# Patient Record
Sex: Female | Born: 1945
Health system: Southern US, Community
[De-identification: ages and names within clinical notes are randomized; demographics above are authoritative.]

## PROBLEM LIST (undated history)

## (undated) DIAGNOSIS — E785 Hyperlipidemia, unspecified: Secondary | ICD-10-CM

## (undated) DIAGNOSIS — I219 Acute myocardial infarction, unspecified: Secondary | ICD-10-CM

## (undated) DIAGNOSIS — Z4502 Encounter for adjustment and management of automatic implantable cardiac defibrillator: Secondary | ICD-10-CM

## (undated) DIAGNOSIS — Z972 Presence of dental prosthetic device (complete) (partial): Secondary | ICD-10-CM

## (undated) DIAGNOSIS — C50919 Malignant neoplasm of unspecified site of unspecified female breast: Secondary | ICD-10-CM

## (undated) DIAGNOSIS — I10 Essential (primary) hypertension: Secondary | ICD-10-CM

## (undated) DIAGNOSIS — K219 Gastro-esophageal reflux disease without esophagitis: Secondary | ICD-10-CM

## (undated) DIAGNOSIS — M1990 Unspecified osteoarthritis, unspecified site: Secondary | ICD-10-CM

## (undated) DIAGNOSIS — I2510 Atherosclerotic heart disease of native coronary artery without angina pectoris: Secondary | ICD-10-CM

## (undated) DIAGNOSIS — I5022 Chronic systolic (congestive) heart failure: Secondary | ICD-10-CM

## (undated) DIAGNOSIS — R011 Cardiac murmur, unspecified: Secondary | ICD-10-CM

## (undated) DIAGNOSIS — F419 Anxiety disorder, unspecified: Secondary | ICD-10-CM

## (undated) HISTORY — DX: Unspecified osteoarthritis, unspecified site: M19.90

## (undated) HISTORY — DX: Malignant neoplasm of unspecified site of unspecified female breast: C50.919

## (undated) HISTORY — DX: Anxiety disorder, unspecified: F41.9

## (undated) HISTORY — DX: Essential (primary) hypertension: I10

## (undated) HISTORY — PX: RENAL ARTERY STENT: SHX2321

## (undated) HISTORY — DX: Acute myocardial infarction, unspecified: I21.9

## (undated) HISTORY — DX: Cardiac murmur, unspecified: R01.1

## (undated) HISTORY — DX: Chronic systolic (congestive) heart failure: I50.22

## (undated) HISTORY — DX: Gastro-esophageal reflux disease without esophagitis: K21.9

## (undated) HISTORY — DX: Encounter for adjustment and management of automatic implantable cardiac defibrillator: Z45.02

## (undated) HISTORY — DX: Hyperlipidemia, unspecified: E78.5

## (undated) HISTORY — DX: Atherosclerotic heart disease of native coronary artery without angina pectoris: I25.10

---

## 1994-11-15 ENCOUNTER — Encounter: Payer: Self-pay | Admitting: Internal Medicine

## 2005-01-14 HISTORY — PX: CORONARY STENT PLACEMENT: SHX1402

## 2005-02-08 ENCOUNTER — Ambulatory Visit: Payer: Self-pay | Admitting: Cardiology

## 2005-02-08 ENCOUNTER — Inpatient Hospital Stay (HOSPITAL_COMMUNITY): Admission: EM | Admit: 2005-02-08 | Discharge: 2005-02-25 | Payer: Self-pay

## 2005-02-08 ENCOUNTER — Ambulatory Visit: Payer: Self-pay | Admitting: Physical Medicine & Rehabilitation

## 2005-02-10 ENCOUNTER — Ambulatory Visit: Payer: Self-pay | Admitting: Pulmonary Disease

## 2005-02-12 ENCOUNTER — Encounter: Payer: Self-pay | Admitting: Cardiology

## 2005-02-24 ENCOUNTER — Encounter: Payer: Self-pay | Admitting: Cardiology

## 2005-02-25 ENCOUNTER — Inpatient Hospital Stay (HOSPITAL_COMMUNITY)
Admission: RE | Admit: 2005-02-25 | Discharge: 2005-03-12 | Payer: Self-pay | Admitting: Physical Medicine & Rehabilitation

## 2005-02-25 ENCOUNTER — Ambulatory Visit: Payer: Self-pay | Admitting: Physical Medicine & Rehabilitation

## 2005-03-19 ENCOUNTER — Encounter: Payer: Self-pay | Admitting: Physical Medicine & Rehabilitation

## 2005-03-24 ENCOUNTER — Ambulatory Visit: Payer: Self-pay | Admitting: Cardiology

## 2005-03-28 ENCOUNTER — Ambulatory Visit (HOSPITAL_COMMUNITY)
Admission: RE | Admit: 2005-03-28 | Discharge: 2005-03-28 | Payer: Self-pay | Admitting: Physical Medicine & Rehabilitation

## 2005-04-03 ENCOUNTER — Emergency Department (HOSPITAL_COMMUNITY): Admission: EM | Admit: 2005-04-03 | Discharge: 2005-04-04 | Payer: Self-pay | Admitting: Emergency Medicine

## 2005-04-14 ENCOUNTER — Ambulatory Visit: Payer: Self-pay | Admitting: Physical Medicine & Rehabilitation

## 2005-04-14 ENCOUNTER — Encounter
Admission: RE | Admit: 2005-04-14 | Discharge: 2005-07-13 | Payer: Self-pay | Admitting: Physical Medicine & Rehabilitation

## 2005-04-16 ENCOUNTER — Encounter: Payer: Self-pay | Admitting: Physical Medicine & Rehabilitation

## 2005-05-06 ENCOUNTER — Encounter: Payer: Self-pay | Admitting: Internal Medicine

## 2005-05-06 ENCOUNTER — Ambulatory Visit: Payer: Self-pay

## 2005-05-12 ENCOUNTER — Ambulatory Visit: Payer: Self-pay | Admitting: Cardiology

## 2005-06-10 ENCOUNTER — Ambulatory Visit: Payer: Self-pay | Admitting: Cardiology

## 2005-06-23 ENCOUNTER — Ambulatory Visit: Payer: Self-pay | Admitting: Cardiology

## 2005-06-30 ENCOUNTER — Ambulatory Visit: Payer: Self-pay | Admitting: Physical Medicine & Rehabilitation

## 2005-07-11 ENCOUNTER — Ambulatory Visit: Payer: Self-pay | Admitting: Internal Medicine

## 2005-07-16 ENCOUNTER — Ambulatory Visit: Payer: Self-pay | Admitting: Internal Medicine

## 2005-07-17 ENCOUNTER — Encounter: Payer: Self-pay | Admitting: Cardiology

## 2005-07-17 ENCOUNTER — Ambulatory Visit (HOSPITAL_COMMUNITY): Admission: RE | Admit: 2005-07-17 | Discharge: 2005-07-18 | Payer: Self-pay | Admitting: Internal Medicine

## 2005-07-17 HISTORY — PX: PACEMAKER PLACEMENT: SHX43

## 2005-07-30 ENCOUNTER — Ambulatory Visit: Payer: Self-pay

## 2005-07-31 ENCOUNTER — Ambulatory Visit: Payer: Self-pay

## 2005-08-14 ENCOUNTER — Ambulatory Visit: Payer: Self-pay | Admitting: Cardiology

## 2005-08-19 ENCOUNTER — Ambulatory Visit: Payer: Self-pay | Admitting: Cardiology

## 2005-08-26 ENCOUNTER — Ambulatory Visit: Payer: Self-pay | Admitting: *Deleted

## 2005-09-03 ENCOUNTER — Ambulatory Visit: Payer: Self-pay | Admitting: Cardiology

## 2005-09-09 ENCOUNTER — Ambulatory Visit: Payer: Self-pay | Admitting: Cardiology

## 2005-09-15 ENCOUNTER — Ambulatory Visit: Payer: Self-pay | Admitting: Cardiology

## 2005-09-30 ENCOUNTER — Ambulatory Visit: Payer: Self-pay | Admitting: Cardiology

## 2005-10-03 ENCOUNTER — Ambulatory Visit: Payer: Self-pay | Admitting: Cardiology

## 2005-10-03 ENCOUNTER — Ambulatory Visit (HOSPITAL_COMMUNITY): Admission: RE | Admit: 2005-10-03 | Discharge: 2005-10-03 | Payer: Self-pay | Admitting: Cardiology

## 2005-10-08 ENCOUNTER — Ambulatory Visit: Payer: Self-pay | Admitting: Internal Medicine

## 2005-10-16 ENCOUNTER — Ambulatory Visit: Payer: Self-pay | Admitting: Cardiology

## 2005-10-21 ENCOUNTER — Ambulatory Visit: Payer: Self-pay | Admitting: Internal Medicine

## 2005-11-13 ENCOUNTER — Ambulatory Visit: Payer: Self-pay | Admitting: Cardiology

## 2005-12-11 ENCOUNTER — Ambulatory Visit: Payer: Self-pay | Admitting: Internal Medicine

## 2006-01-14 ENCOUNTER — Ambulatory Visit: Payer: Self-pay | Admitting: Internal Medicine

## 2006-01-28 ENCOUNTER — Ambulatory Visit: Payer: Self-pay | Admitting: Internal Medicine

## 2006-03-16 HISTORY — PX: OTHER SURGICAL HISTORY: SHX169

## 2006-03-22 ENCOUNTER — Other Ambulatory Visit: Payer: Self-pay

## 2006-03-22 ENCOUNTER — Inpatient Hospital Stay: Payer: Self-pay | Admitting: Internal Medicine

## 2006-04-02 ENCOUNTER — Ambulatory Visit: Payer: Self-pay | Admitting: Internal Medicine

## 2006-04-03 ENCOUNTER — Ambulatory Visit: Payer: Self-pay | Admitting: Cardiology

## 2006-04-16 ENCOUNTER — Ambulatory Visit: Payer: Self-pay | Admitting: Internal Medicine

## 2006-04-28 ENCOUNTER — Ambulatory Visit: Payer: Self-pay | Admitting: Internal Medicine

## 2006-06-16 HISTORY — PX: NM MYOVIEW LTD: HXRAD82

## 2006-06-22 ENCOUNTER — Ambulatory Visit: Payer: Self-pay | Admitting: Internal Medicine

## 2006-07-06 ENCOUNTER — Ambulatory Visit: Payer: Self-pay | Admitting: Cardiology

## 2006-07-09 ENCOUNTER — Ambulatory Visit: Payer: Self-pay

## 2006-07-17 ENCOUNTER — Ambulatory Visit: Payer: Self-pay | Admitting: Internal Medicine

## 2006-08-05 ENCOUNTER — Ambulatory Visit: Payer: Self-pay | Admitting: Internal Medicine

## 2006-08-25 ENCOUNTER — Ambulatory Visit: Payer: Self-pay | Admitting: Internal Medicine

## 2006-08-25 LAB — CONVERTED CEMR LAB
ALT: 18 units/L (ref 0–40)
HDL: 36.1 mg/dL — ABNORMAL LOW (ref >39.0)
Total CHOL/HDL Ratio: 5.3
VLDL: 48 mg/dL — ABNORMAL HIGH (ref 0–40)

## 2006-11-16 ENCOUNTER — Ambulatory Visit: Payer: Self-pay | Admitting: Internal Medicine

## 2006-11-16 DIAGNOSIS — I5022 Chronic systolic (congestive) heart failure: Secondary | ICD-10-CM | POA: Insufficient documentation

## 2006-11-16 DIAGNOSIS — I10 Essential (primary) hypertension: Secondary | ICD-10-CM | POA: Insufficient documentation

## 2006-11-16 DIAGNOSIS — J309 Allergic rhinitis, unspecified: Secondary | ICD-10-CM | POA: Insufficient documentation

## 2006-11-16 DIAGNOSIS — E785 Hyperlipidemia, unspecified: Secondary | ICD-10-CM | POA: Insufficient documentation

## 2006-11-16 DIAGNOSIS — M549 Dorsalgia, unspecified: Secondary | ICD-10-CM | POA: Insufficient documentation

## 2006-11-16 DIAGNOSIS — I2510 Atherosclerotic heart disease of native coronary artery without angina pectoris: Secondary | ICD-10-CM | POA: Insufficient documentation

## 2006-11-16 DIAGNOSIS — K219 Gastro-esophageal reflux disease without esophagitis: Secondary | ICD-10-CM | POA: Insufficient documentation

## 2006-11-16 DIAGNOSIS — M159 Polyosteoarthritis, unspecified: Secondary | ICD-10-CM | POA: Insufficient documentation

## 2006-11-16 DIAGNOSIS — M150 Primary generalized (osteo)arthritis: Secondary | ICD-10-CM

## 2006-11-17 ENCOUNTER — Ambulatory Visit: Payer: Self-pay | Admitting: Cardiology

## 2006-11-17 LAB — CONVERTED CEMR LAB
BUN: 16 mg/dL (ref 6–23)
Chloride: 105 meq/L (ref 96–112)
Glucose, Bld: 111 mg/dL — ABNORMAL HIGH (ref 70–99)
Potassium: 4.5 meq/L (ref 3.5–5.1)
Sodium: 142 meq/L (ref 135–145)
VLDL: 75 mg/dL — ABNORMAL HIGH (ref 0–40)

## 2006-11-25 ENCOUNTER — Ambulatory Visit: Payer: Self-pay | Admitting: Internal Medicine

## 2006-12-28 ENCOUNTER — Telehealth: Payer: Self-pay | Admitting: Internal Medicine

## 2007-02-03 ENCOUNTER — Ambulatory Visit: Payer: Self-pay | Admitting: Internal Medicine

## 2007-02-22 ENCOUNTER — Ambulatory Visit: Payer: Self-pay | Admitting: Internal Medicine

## 2007-02-25 ENCOUNTER — Telehealth (INDEPENDENT_AMBULATORY_CARE_PROVIDER_SITE_OTHER): Payer: Self-pay | Admitting: *Deleted

## 2007-03-01 ENCOUNTER — Encounter: Payer: Self-pay | Admitting: Internal Medicine

## 2007-03-13 ENCOUNTER — Emergency Department: Payer: Self-pay | Admitting: Emergency Medicine

## 2007-03-24 ENCOUNTER — Encounter: Payer: Self-pay | Admitting: Internal Medicine

## 2007-04-15 ENCOUNTER — Telehealth (INDEPENDENT_AMBULATORY_CARE_PROVIDER_SITE_OTHER): Payer: Self-pay | Admitting: *Deleted

## 2007-05-19 ENCOUNTER — Telehealth (INDEPENDENT_AMBULATORY_CARE_PROVIDER_SITE_OTHER): Payer: Self-pay | Admitting: *Deleted

## 2007-06-16 ENCOUNTER — Ambulatory Visit: Payer: Self-pay | Admitting: Cardiology

## 2007-07-30 ENCOUNTER — Telehealth (INDEPENDENT_AMBULATORY_CARE_PROVIDER_SITE_OTHER): Payer: Self-pay | Admitting: *Deleted

## 2007-07-30 ENCOUNTER — Ambulatory Visit: Payer: Self-pay | Admitting: Internal Medicine

## 2007-07-30 DIAGNOSIS — R946 Abnormal results of thyroid function studies: Secondary | ICD-10-CM | POA: Insufficient documentation

## 2007-07-30 DIAGNOSIS — D649 Anemia, unspecified: Secondary | ICD-10-CM | POA: Insufficient documentation

## 2007-08-02 LAB — CONVERTED CEMR LAB
ALT: 17 units/L (ref 0–35)
AST: 26 units/L (ref 0–37)
Basophils Absolute: 0.1 10*3/uL (ref 0.0–0.1)
Bilirubin, Direct: 0.1 mg/dL (ref 0.0–0.3)
CO2: 30 meq/L (ref 19–32)
Calcium: 9.2 mg/dL (ref 8.4–10.5)
Cholesterol: 182 mg/dL (ref 0–200)
Eosinophils Absolute: 0.2 10*3/uL (ref 0.0–0.6)
Eosinophils Relative: 2.7 % (ref 0.0–5.0)
Free T4: 0.7 ng/dL (ref 0.6–1.6)
GFR calc non Af Amer: 68 mL/min
LDL Cholesterol: 107 mg/dL — ABNORMAL HIGH (ref 0–99)
MCHC: 33.1 g/dL (ref 30.0–36.0)
Neutro Abs: 4 10*3/uL (ref 1.4–7.7)
Neutrophils Relative %: 62.7 % (ref 43.0–77.0)
Platelets: 185 10*3/uL (ref 150–400)
RBC: 3.94 M/uL (ref 3.87–5.11)
RDW: 13 % (ref 11.5–14.6)
Sodium: 142 meq/L (ref 135–145)
TSH: 3.71 microintl units/mL (ref 0.35–5.50)
Total Bilirubin: 0.7 mg/dL (ref 0.3–1.2)
Total Protein: 6.8 g/dL (ref 6.0–8.3)

## 2007-08-04 ENCOUNTER — Ambulatory Visit: Payer: Self-pay | Admitting: Internal Medicine

## 2007-10-11 ENCOUNTER — Telehealth (INDEPENDENT_AMBULATORY_CARE_PROVIDER_SITE_OTHER): Payer: Self-pay | Admitting: *Deleted

## 2007-10-12 ENCOUNTER — Telehealth (INDEPENDENT_AMBULATORY_CARE_PROVIDER_SITE_OTHER): Payer: Self-pay | Admitting: *Deleted

## 2007-11-16 ENCOUNTER — Ambulatory Visit: Payer: Self-pay | Admitting: Internal Medicine

## 2007-12-16 ENCOUNTER — Ambulatory Visit: Payer: Self-pay | Admitting: Cardiology

## 2007-12-29 ENCOUNTER — Ambulatory Visit: Payer: Self-pay

## 2008-02-02 ENCOUNTER — Ambulatory Visit: Payer: Self-pay | Admitting: Internal Medicine

## 2008-02-04 LAB — CONVERTED CEMR LAB
ALT: 15 units/L (ref 0–35)
Albumin: 4 g/dL (ref 3.5–5.2)
Alkaline Phosphatase: 63 units/L (ref 39–117)
Basophils Absolute: 0.1 10*3/uL (ref 0.0–0.1)
Basophils Relative: 1.3 % (ref 0.0–3.0)
Bilirubin, Direct: 0.1 mg/dL (ref 0.0–0.3)
CO2: 29 meq/L (ref 19–32)
Creatinine, Ser: 0.9 mg/dL (ref 0.4–1.2)
HCT: 36.3 % (ref 36.0–46.0)
Hemoglobin: 12.8 g/dL (ref 12.0–15.0)
MCV: 93.8 fL (ref 78.0–100.0)
Monocytes Absolute: 0.4 10*3/uL (ref 0.1–1.0)
Neutro Abs: 4.4 10*3/uL (ref 1.4–7.7)
Neutrophils Relative %: 63.2 % (ref 43.0–77.0)
RBC: 3.87 M/uL (ref 3.87–5.11)
RDW: 13.6 % (ref 11.5–14.6)
Sodium: 144 meq/L (ref 135–145)
Triglycerides: 136 mg/dL (ref 0–149)

## 2008-03-08 ENCOUNTER — Ambulatory Visit: Payer: Self-pay | Admitting: Internal Medicine

## 2008-03-27 ENCOUNTER — Telehealth: Payer: Self-pay | Admitting: Family Medicine

## 2008-04-14 ENCOUNTER — Telehealth: Payer: Self-pay | Admitting: Internal Medicine

## 2008-05-22 ENCOUNTER — Telehealth: Payer: Self-pay | Admitting: Family Medicine

## 2008-06-19 ENCOUNTER — Ambulatory Visit: Payer: Self-pay | Admitting: Internal Medicine

## 2008-07-03 ENCOUNTER — Ambulatory Visit: Payer: Self-pay | Admitting: Internal Medicine

## 2008-07-19 ENCOUNTER — Encounter: Payer: Self-pay | Admitting: Internal Medicine

## 2008-07-19 ENCOUNTER — Telehealth: Payer: Self-pay | Admitting: Internal Medicine

## 2008-08-01 ENCOUNTER — Ambulatory Visit: Payer: Self-pay | Admitting: Cardiology

## 2008-08-14 ENCOUNTER — Encounter: Payer: Self-pay | Admitting: Internal Medicine

## 2008-08-14 ENCOUNTER — Ambulatory Visit: Payer: Self-pay

## 2008-10-12 ENCOUNTER — Encounter: Payer: Self-pay | Admitting: Internal Medicine

## 2008-10-19 ENCOUNTER — Ambulatory Visit: Payer: Self-pay | Admitting: Internal Medicine

## 2008-10-27 ENCOUNTER — Encounter: Payer: Self-pay | Admitting: Internal Medicine

## 2008-11-02 ENCOUNTER — Telehealth: Payer: Self-pay | Admitting: Internal Medicine

## 2008-11-09 DIAGNOSIS — I2589 Other forms of chronic ischemic heart disease: Secondary | ICD-10-CM | POA: Insufficient documentation

## 2008-11-09 DIAGNOSIS — Z95810 Presence of automatic (implantable) cardiac defibrillator: Secondary | ICD-10-CM | POA: Insufficient documentation

## 2008-11-20 ENCOUNTER — Encounter: Payer: Self-pay | Admitting: Internal Medicine

## 2008-11-20 ENCOUNTER — Ambulatory Visit: Payer: Self-pay | Admitting: Internal Medicine

## 2008-11-28 ENCOUNTER — Ambulatory Visit: Payer: Self-pay | Admitting: Internal Medicine

## 2008-11-29 LAB — CONVERTED CEMR LAB
ALT: 13 units/L (ref 0–35)
AST: 23 units/L (ref 0–37)
Alkaline Phosphatase: 73 units/L (ref 39–117)
BUN: 10 mg/dL (ref 6–23)
Basophils Relative: 0.7 % (ref 0.0–3.0)
Chloride: 110 meq/L (ref 96–112)
Eosinophils Relative: 3 % (ref 0.0–5.0)
Free T4: 0.9 ng/dL (ref 0.6–1.6)
HCT: 36.1 % (ref 36.0–46.0)
HDL: 40.9 mg/dL (ref >39.00)
LDL Cholesterol: 113 mg/dL — ABNORMAL HIGH (ref 0–99)
MCV: 95.5 fL (ref 78.0–100.0)
Neutro Abs: 4.9 10*3/uL (ref 1.4–7.7)
Phosphorus: 4.7 mg/dL — ABNORMAL HIGH (ref 2.3–4.6)
Potassium: 4.6 meq/L (ref 3.5–5.1)
RDW: 13.4 % (ref 11.5–14.6)
TSH: 1.49 microintl units/mL (ref 0.35–5.50)
Total Bilirubin: 0.6 mg/dL (ref 0.3–1.2)
Total CHOL/HDL Ratio: 5
Triglycerides: 158 mg/dL — ABNORMAL HIGH (ref 0.0–149.0)

## 2008-12-04 ENCOUNTER — Telehealth: Payer: Self-pay | Admitting: Cardiology

## 2009-02-23 ENCOUNTER — Encounter: Payer: Self-pay | Admitting: Internal Medicine

## 2009-03-20 ENCOUNTER — Telehealth: Payer: Self-pay | Admitting: Internal Medicine

## 2009-04-17 ENCOUNTER — Encounter (INDEPENDENT_AMBULATORY_CARE_PROVIDER_SITE_OTHER): Payer: Self-pay | Admitting: *Deleted

## 2009-05-04 ENCOUNTER — Telehealth: Payer: Self-pay | Admitting: Internal Medicine

## 2009-05-14 ENCOUNTER — Encounter: Payer: Self-pay | Admitting: Internal Medicine

## 2009-06-04 ENCOUNTER — Ambulatory Visit: Payer: Self-pay | Admitting: Internal Medicine

## 2009-06-05 LAB — CONVERTED CEMR LAB
ALT: 14 units/L (ref 0–35)
AST: 21 units/L (ref 0–37)
Albumin: 3.9 g/dL (ref 3.5–5.2)
BUN: 12 mg/dL (ref 6–23)
CO2: 30 meq/L (ref 19–32)
Calcium: 9 mg/dL (ref 8.4–10.5)
Chloride: 103 meq/L (ref 96–112)
Cholesterol: 194 mg/dL (ref 0–200)
Creatinine, Ser: 0.9 mg/dL (ref 0.4–1.2)
Direct LDL: 126 mg/dL
GFR calc non Af Amer: 67.43 mL/min (ref >60)
Glucose, Bld: 90 mg/dL (ref 70–99)
Lymphocytes Relative: 24.3 % (ref 12.0–46.0)
Lymphs Abs: 1.7 10*3/uL (ref 0.7–4.0)
Neutrophils Relative %: 66.9 % (ref 43.0–77.0)
Potassium: 4.2 meq/L (ref 3.5–5.1)
Total Protein: 6.8 g/dL (ref 6.0–8.3)
VLDL: 43.6 mg/dL — ABNORMAL HIGH (ref 0.0–40.0)

## 2009-07-02 ENCOUNTER — Telehealth (INDEPENDENT_AMBULATORY_CARE_PROVIDER_SITE_OTHER): Payer: Self-pay | Admitting: *Deleted

## 2009-08-30 ENCOUNTER — Ambulatory Visit: Payer: Self-pay | Admitting: Internal Medicine

## 2009-08-30 ENCOUNTER — Telehealth (INDEPENDENT_AMBULATORY_CARE_PROVIDER_SITE_OTHER): Payer: Self-pay | Admitting: *Deleted

## 2009-09-03 LAB — CONVERTED CEMR LAB
Calcium: 8.9 mg/dL (ref 8.4–10.5)
Glucose, Bld: 93 mg/dL (ref 70–99)
Potassium: 4.5 meq/L (ref 3.5–5.1)
Pro B Natriuretic peptide (BNP): 157 pg/mL — ABNORMAL HIGH (ref 0.0–100.0)

## 2009-10-03 ENCOUNTER — Encounter (INDEPENDENT_AMBULATORY_CARE_PROVIDER_SITE_OTHER): Payer: Self-pay | Admitting: *Deleted

## 2010-01-15 ENCOUNTER — Telehealth: Payer: Self-pay | Admitting: Family Medicine

## 2010-03-15 ENCOUNTER — Encounter (INDEPENDENT_AMBULATORY_CARE_PROVIDER_SITE_OTHER): Payer: Self-pay | Admitting: *Deleted

## 2010-04-10 ENCOUNTER — Telehealth (INDEPENDENT_AMBULATORY_CARE_PROVIDER_SITE_OTHER): Payer: Self-pay | Admitting: *Deleted

## 2010-04-16 ENCOUNTER — Encounter: Payer: Self-pay | Admitting: Internal Medicine

## 2010-07-07 ENCOUNTER — Encounter: Payer: Self-pay | Admitting: Physical Medicine & Rehabilitation

## 2010-07-16 NOTE — Letter (Signed)
Summary: Device-Delinquent Check  [facility], Main Office  [address]   [place], [place] [address]   Phone: [phone]  Fax: [phone]     October 03, 2009 MRN: [id number]   [facility] 8-C CATES [address] [place], [place]  [address]   Dear Ms. Mersch,  According to our records, you have not had your implanted device checked in the recommended period of time.  We are unable to determine appropriate device function without checking your device on a regular basis.  Please call our [place]  office @ [phone] to schedule an appointment as soon as possible with Dr. Graciela Husbands.    If you are having your device checked by another physician, please call us so that we may update our records.  Thank you,  Altha Harm, LPN  October 03, 2009 3:34 PM  [facility]

## 2010-07-16 NOTE — Progress Notes (Signed)
Summary: ? Bronchitis  Phone Note Call from Patient Call back at Home Phone [phone]   Caller: Patient Call For: Cindee Salt MD Summary of Call: Patient says she called EMS last night because she was having trouble breathing.  She did not go the the ER but the EMS personnel told her that she has bronchitis and needs an antibiotic.  I advised patient that we usually don't prescribe antibiotics without seeing the patient first, but I will give Dr. Alphonsus Sias the message and see what he advises.  She says that she does not have a way to the office today.  Uses [facility] pharmacy.   Initial call taken by: Linde Gillis CMA Duncan Dull),  August 30, 2009 9:21 AM  Follow-up for Phone Call        okay to add on at 4:30PM today I really can't prescribe a medicine without seeing her sometimes what people think is bronchitis could be pneumonia or even a problem with her heart Follow-up by: Cindee Salt MD,  August 30, 2009 12:03 PM  Additional Follow-up for Phone Call Additional follow up Details #1::        spoke with pt and she will call back if she can get a ride here. Pt doesn't want to be seen, just want med called in. Per Dr. Alphonsus Sias she needs to be seen. DeShannon Kozicki CMA Duncan Dull)  August 30, 2009 2:31 PM    Patient is coming at 4:30.  Additional Follow-up by: Melody Comas,  August 30, 2009 2:37 PM

## 2010-07-16 NOTE — Cardiovascular Report (Signed)
Summary: Certified Letter Returned (Moved to [employer])  Psychiatric nurse Returned (Moved to [employer])   Imported By: Debby Freiberg 04/30/2010 11:26:26  _____________________________________________________________________  External Attachment:    Type:   Image     Comment:   External Document

## 2010-07-16 NOTE — Progress Notes (Signed)
Summary: Letter for Medicaid  Phone Note Call from Patient   Caller: Patient Call For: Cindee Salt MD Summary of Call: Patient is requesting that you write a letter claiming her as your patient and that you are her PCP. If they do not get this letter she will have to choose one of their doctors. The letter will exempt her from having to choose one of their doctors.  Patient states that the letter has to have the word "Exemption" in it from her having to choose one of their doctors. This letter is needed for her Medicaid disability.  Please advise. Initial call taken by: Sydell Axon LPN,  July 02, 2009 9:24 AM  Follow-up for Phone Call        there is actually a form that Medicaid sends me which I can claim the exemption. If she can get them to send that to me, I can do it without charge. If I have to write the letter, there is a $20 charge Follow-up by: Cindee Salt MD,  July 02, 2009 1:40 PM  Additional Follow-up for Phone Call Additional follow up Details #1::        I spoke w/ pt. and she'll call and try to get the form sent here. Additional Follow-up by: Beau Fanny,  July 02, 2009 2:11 PM

## 2010-07-16 NOTE — Assessment & Plan Note (Signed)
Summary: bronchitis/ alc   Vital Signs:  Patient profile:   65 year old female Weight:      126 pounds BMI:     21.71 O2 Sat:      96 % on Room air Temp:     98.9 degrees F oral Pulse rate:   70 / minute Pulse rhythm:   regular Resp:     18 per minute BP sitting:   140 / 80  (left arm) Cuff size:   regular  Vitals Entered By: Mervin Hack CMA Duncan Dull) (August 30, 2009 4:50 PM)  O2 Flow:  Room air CC: trouble breathing   History of Present Illness: Having some breathing problems Awoke 2 nights ago with fluid "gushing out of my throat" Had to sit up quickly but doesnt' remember being SOB Last night she couldn't breathe called rescue---they thought it sounded like bronchitis  Started wtih head cold 5 days ago coughing now Clear bubbly sputum clear rhinorrhea  having discomfort over  right kidney Having easy incontinence also  No chest pain No fever last night  No ankle swelling still on furosemide  Allergies: 1)  Oxycodone Hcl (Oxycodone Hcl) 2)  Lipitor (Atorvastatin Calcium) 3)  Lisinopril (Lisinopril) 4)  Simvastatin (Simvastatin)  Past History:  Past medical, surgical, family and social histories (including risk factors) reviewed for relevance to current acute and chronic problems.  Past Medical History: Reviewed history from 11/28/2008 and no changes required. Allergic rhinitis Anxiety Coronary artery disease-----Dr Wall GERD Hyperlipidemia Hypertension Osteoarthritis Congestive heart failure-systolic (ischemic cardiomyopathy)  Past Surgical History: Reviewed history from 11/28/2008 and no changes required. MI/ 3 stents 8/06 Pacer/ defibrillator 2/07 D & C (after loss of preg) 10/66 CHF exacerbation 10/07 Aden myoview EF 39%, no sx ischemia 01/08  Family History: Reviewed history from 03/01/2007 and no changes required. Father: Died at age 69, MI Mother: Died at age 70, CVA Siblings: One brother living, 1/2 brother deceased at age 54  MI CAD is strong in family HTN is strong No DM No breast or colon cancer  Social History: Reviewed history from 11/28/2008 and no changes required. 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  NAD Head:  no sinus tenderness Ears:  R ear normal and L ear normal.   Nose:  mild congestion and inflammation Mouth:  no erythema and no exudates.   Neck:  supple, no masses, and no cervical lymphadenopathy.   Lungs:  Prolonged exp phase and exp wheezing normal respiratory effort, no intercostal retractions, no accessory muscle use, no dullness, and no crackles.   Heart:  normal rate, regular rhythm, and no gallop.   Extremities:  no edema Psych:  normally interactive and good eye contact.   Additional Exam:  CXR--no pneumonia or CHF   Impression & Recommendations:  Problem # 1:  BRONCHITIS- ACUTE (ICD-466.0) Assessment New  with bronchospasm likely the bronchospasm is cause of SOB requiring rescue last night will treat with kenalog IM to help for tonight, then 10 days of prednisone likely viral but will give Rx for amoxil if sputum turns purulent  The following medications were removed from the medication list:    Guaifenesin 100 Mg/37ml Syrp (Guaifenesin) .Marland Kitchen... 2 teaspoons by mouth three times a day as  needed for cough Her updated medication list for this problem includes:    Proair Hfa 108 (90 Base) Mcg/act Aers (Albuterol sulfate) .Marland Kitchen... 2 puffs as needed    Amoxicillin 500 Mg Tabs (Amoxicillin) .Marland Kitchen... 2 tabs by mouth two times a day for bronchitis  Orders: Kenalog 10 mg inj (J3301) Admin of Therapeutic Inj  intramuscular or subcutaneous ([address])  Problem # 2:  FAILURE, SYSTOLIC HEART, CHRONIC (ICD-428.22) Assessment:  Unchanged no exacerbation based on CXR  will check labs though  Her updated medication list for this problem includes:    Coreg 25 Mg Tabs (Carvedilol) .Marland Kitchen... Take 1 tablet by mouth two times a day    Aspirin 81 Mg Tbec (Aspirin) .Marland Kitchen... Take 1 tablet by mouth once a day    Diovan 320 Mg Tabs (Valsartan) .Marland Kitchen... 1 daily    Furosemide 40 Mg Tabs (Furosemide) .Marland Kitchen... 1 daily as needed as needed    Aspirin 81 Mg Tabs (Aspirin) .Marland Kitchen... Take 1 by mouth once daily  Complete Medication List: 1)  Lorazepam 0.5 Mg Tabs (Lorazepam) .... Take 1/2-1 tablet by mouth twice a day as needed 2)  Crestor 10 Mg Tabs (Rosuvastatin calcium) .... Take 1 tablet by mouth at bedtime 3)  Coreg 25 Mg Tabs (Carvedilol) .... Take 1 tablet by mouth two times a day 4)  Omeprazole 20 Mg Cpdr (Omeprazole) .... Take 1 capsule by mouth two times a day 5)  Sertraline Hcl 50 Mg Tabs (Sertraline hcl) .... Take 1 tablet by mouth once a day 6)  Aspirin 81 Mg Tbec (Aspirin) .... Take 1 tablet by mouth once a day 7)  Proair Hfa 108 (90 Base) Mcg/act Aers (Albuterol sulfate) .... 2 puffs as needed 8)  Diovan 320 Mg Tabs (Valsartan) .Marland Kitchen.. 1 daily 9)  Furosemide 40 Mg Tabs (Furosemide) .Marland Kitchen.. 1 daily as needed as needed 10)  Amlodipine Besylate 2.5 Mg Tabs (Amlodipine besylate) .Marland Kitchen.. 1 tab daily for high blood pressure 11)  Saline Nasal Spray 0.65 % Soln (Saline) .... 2 sprays each nostril every 4 hours as needed 12)  Acetaminophen 500 Mg Caps (Acetaminophen) .... As needed 13)  Aspirin 81 Mg Tabs (Aspirin) .... Take 1 by mouth once daily 14)  Prednisone 20 Mg Tabs (Prednisone) .... 2 tabs daily for 5 days, then 1 tab daily for wheezing 15)  Amoxicillin 500 Mg Tabs (Amoxicillin) .... 2 tabs by mouth two times a day for bronchitis  Other Orders: CXR- 2view (CXR) TLB-Renal Function Panel (80069-RENAL) Venipuncture (60454) TLB-BNP (B-Natriuretic Peptide) (83880-BNPR)  Patient Instructions: 1)  Keep regular appt in June 2)  Call if you are  not significantly improved by Monday Prescriptions: AMOXICILLIN 500 MG TABS (AMOXICILLIN) 2 tabs by mouth two times a day for bronchitis  #40 x 0   Entered and Authorized by:   Cindee Salt MD   Signed by:   Cindee Salt MD on 08/30/2009   Method used:   Print then Give to Patient   RxID:   [id number] PREDNISONE 20 MG TABS (PREDNISONE) 2 tabs daily for 5 days, then 1 tab daily for wheezing  #15 x 0   Entered and Authorized by:   Cindee Salt MD   Signed by:   Cindee Salt MD on 08/30/2009   Method used:   Electronically to        [facility] [phone]* (retail)       [address] [place], [place]  [address]  Ph: [phone]       Fax: [phone]   RxID:   [id number]   Current Allergies (reviewed today): OXYCODONE HCL (OXYCODONE HCL) LIPITOR (ATORVASTATIN CALCIUM) LISINOPRIL (LISINOPRIL) SIMVASTATIN (SIMVASTATIN)   Medication Administration  Injection # 1:    Medication: Kenalog 10 mg inj    Diagnosis: BRONCHITIS- ACUTE (ICD-466.0)    Route: IM    Site: L deltoid    Exp Date: 02/15/2011    Lot #: [id number]    Mfr: [employer]    Comments: pt received 40mg /50ml    Patient tolerated injection without complications    Given by: Mervin Hack CMA Duncan Dull) (August 30, 2009 5:37 PM)  Orders Added: 1)  CXR- 2view [CXR] 2)  TLB-Renal Function Panel [80069-RENAL] 3)  Venipuncture [13244] 4)  TLB-BNP (B-Natriuretic Peptide) [83880-BNPR] 5)  Est. Patient Level IV [01027] 6)  Kenalog 10 mg inj [J3301] 7)  Admin of Therapeutic Inj  intramuscular or subcutaneous [[address]]  Appended Document: bronchitis/ alc Received a call from Laurabeth at [facility] pharmacy stating that patient said Dr. Alphonsus Sias gave her a Rx for Amoxicillin but she could not find it, wanted to know if I could give her the Rx verbally.  Gave Rx to her over the phone.

## 2010-07-16 NOTE — Progress Notes (Signed)
   Request received from [facility] faxed most recent, the requesty will be sent to  [facility] for them to copy all records and forward. Cala Bradford Mesiemore  April 10, 2010 2:22 PM

## 2010-07-16 NOTE — Letter (Signed)
Summary: Device-Delinquent Check  [facility], Main Office  [address]   [place], [place] [address]   Phone: [phone]  Fax: [phone]     March 15, 2010 MRN: [id number]   [facility] 8-C CATES [address] [place], [place]  [address]   Dear Ms. Spiering,  According to our records, you have not had your implanted device checked in the recommended period of time.  We are unable to determine appropriate device function without checking your device on a regular basis.  Please call our office to schedule an appointment with Dr Graciela Husbands,  as soon as possible.  If you are having your device checked by another physician, please call us so that we may update our records.  Thank you,  Letta Moynahan, EMT  March 15, 2010 2:54 PM  [facility] certified

## 2010-07-16 NOTE — Progress Notes (Signed)
Summary: refill request for lorazepam  Phone Note Refill Request Message from:  Fax from Pharmacy  Refills Requested: Medication #1:  LORAZEPAM 0.5 MG TABS Take 1/2-1 tablet by mouth twice a day as needed   Last Refilled: 06/18/2009 Faxed request from [facility], [phone].  Initial call taken by: Lowella Petties CMA,  January 15, 2010 8:55 AM  Follow-up for Phone Call        Rx called to pharmacy Follow-up by: Linde Gillis CMA Duncan Dull),  January 15, 2010 9:06 AM    Prescriptions: LORAZEPAM 0.5 MG TABS (LORAZEPAM) Take 1/2-1 tablet by mouth twice a day as needed  #60 x 1   Entered and Authorized by:   Ruthe Mannan MD   Signed by:   Ruthe Mannan MD on 01/15/2010   Method used:   Telephoned to ...       [facility] [address] [phone]* (retail)       [address] [place], [place]  [address]       Ph: [phone]       Fax: [phone]   RxID:   [id number]

## 2010-07-29 ENCOUNTER — Telehealth: Payer: Self-pay | Admitting: Internal Medicine

## 2010-08-07 NOTE — Progress Notes (Signed)
Summary: lorazepam   Phone Note Refill Request Message from:  Fax from Pharmacy on July 29, 2010 10:14 AM  Refills Requested: Medication #1:  LORAZEPAM 0.5 MG TABS Take 1/2-1 tablet by mouth twice a day as needed   Last Refilled: [birth year] 03, 1945 [facility]ill [facility] [address] [phone]. Fax is on your desk.  Initial call taken by: Melody Comas,  July 29, 2010 10:15 AM  Follow-up for Phone Call        Okay #60 x 0  Overdue for appt Have her make follow up Follow-up by: Cindee Salt MD,  July 29, 2010 1:26 PM  Additional Follow-up for Phone Call Additional follow up Details #1::        Rx faxed to pharmacy, left message on machine at home for patient to return my call.  Additional Follow-up by: Mervin Hack CMA Duncan Dull),  July 29, 2010 3:42 PM    Prescriptions: LORAZEPAM 0.5 MG TABS (LORAZEPAM) Take 1/2-1 tablet by mouth twice a day as needed  #60 x 0   Entered by:   Mervin Hack CMA (AAMA)   Authorized by:   Cindee Salt MD   Signed by:   Mervin Hack CMA (AAMA) on 07/29/2010   Method used:   Handwritten   RxID:   [id number]

## 2010-09-09 ENCOUNTER — Other Ambulatory Visit: Payer: Self-pay | Admitting: Internal Medicine

## 2010-10-29 NOTE — Progress Notes (Signed)
[facility] OFFICE NOTE   VALLARIE, FEI                          MRN:          [id number]  DATE:11/20/2008                            DOB:          February 22, 1946    Ms. Achey is seen in followup for ICD implanted for primary prevention  in the setting of ischemic heart disease.  She is doing quite well.  She  has had no complaints of chest pain or shortness of breath.  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   Her current medications include:  1. Crestor.  2. Carvedilol 25 b.i.d.  3. Diovan 320.  4. Amlodipine recently initiated.  5. Sertraline.  6. Lorazepam.  7. Aspirin.   On examination, her blood pressure today was mildly elevated at 144/87.  Her weight was 129.  Her pulse was 62.  She was in no acute distress.  She was alert and oriented.  Her lungs were clear.  Heart sounds were  regular.  The extremities were without edema.   Interrogation of her [employer] ICD demonstrates a battery voltage of 3.05  with a P-wave of 3, impedance of 460, a threshold of 0.75.  The R-wave  was 12 with impedance of 510, a threshold of 0.5.  There were no  intercurrent therapies.   IMPRESSION:  1. Ischemic cardiomyopathy with a recent Myoview scan demonstrated      ejection fraction of 40% and no ischemia.  2. Status post implantable cardioverter-defibrillator for primary      prevention.   Ms. Morace is doing quite.  We will plan to see her again in 1 year's  time, and she will be followed remotely in the interim.     Duke Salvia, MD, [facility]  Electronically Signed    SCK/MedQ  DD: 11/20/2008  DT: 11/21/2008  Job #: [id number]

## 2010-10-29 NOTE — Assessment & Plan Note (Signed)
[facility]                         ELECTROPHYSIOLOGY OFFICE NOTE   Christine, [name]                          MRN:          [id number]  DATE:11/16/2007                            DOB:          02/21/1946    Christine [name] is seen in followup for an ICD implanted for primary  prevention in the setting of ischemic heart disease.  She has had no  intercurrent problems with chest pain or shortness of breath.   CURRENT MEDICATIONS:  1. Aspirin.  2. Crestor.  3. Coreg 25 b.i.d.  4. Prilosec.  5. Sertraline.  6. Diovan 320.   PHYSICAL EXAMINATION:  VITAL SIGNS:  Her blood pressure today was  122/78, with a pulse of 68, her weight was 135, which was of concern to  her.  LUNGS:  Clear.  HEART:  Sounds were regular.  EXTREMITIES:  Without edema.   Interrogation of her [employer] ICD demonstrates a P-wave of 3, and an  impedance of 465, a threshold 0.7 at 0.5 in both chambers, the R-wave  was 12, with impedance of 510.  There are no intercurrent therapies.   IMPRESSION:  1. Ischemic cardiomyopathy.  2. Prior myocardial infarction.  3. Status post implantable cardioverter defibrillator for primary      prevention.   Christine [name] is doing well.  We will plan to see her again in 1 year's  time.  She will be followed remotely in the interim.     Duke Salvia, MD, [facility]  Electronically Signed    SCK/MedQ  DD: 11/16/2007  DT: 11/16/2007  Job #: [id number]

## 2010-10-29 NOTE — Assessment & Plan Note (Signed)
[facility]                         ELECTROPHYSIOLOGY OFFICE NOTE   TIEN, AISPURO                          MRN:          [id number]  DATE:11/25/2006                            DOB:          [birth year]    Mrs. Christine [name] is seen.  She is status post ICD implantation for primary  prevention in the setting of ischemic heart disease with congestive  heart failure.  Her breathing is going pretty well at this point.  She  has had no intercurrent chest pain.   Apparently, her diet has not been going so well.  She also has  complaints about leg pain, which has been attributed in part to Crestor,  but this has been off again and now is back on again.   On examination today, her blood pressure was 143/76 with a pulse of 69.  Lungs were clear.  Heart sounds were regular.  The extremities were  without edema.   Interrogation of her [employer] ICD demonstrates a P-wave of 3  with impedance of 465, a threshold of 0.5 at 0.5, the R-wave was 12 with  impedance of 520, a threshold of 0.5 at 0.5.  Battery voltage is 3.2.  She was atrial paced at 38% of the time.   Her device was reprogrammed to allow for intrinsic conduction.   IMPRESSION:  1. Ischemic heart disease.  2. Depressed left ventricular function.  3. Status post ICD for primary prevention.  4. Poor AV nodal conduction with reprogramming.   [name] is stable.  We will see her again in one year's time.  Can be  followed remotely via [facility].     Duke Salvia, MD, [facility]  Electronically Signed    SCK/MedQ  DD: 11/25/2006  DT: 11/25/2006  Job #: [id number]   cc:   Karie Schwalbe, MD  [place], [facility]

## 2010-10-29 NOTE — Assessment & Plan Note (Signed)
[facility] OFFICE NOTE   Christine [name], Christine [name]                          MRN:          [id number]  DATE:12/16/2007                            DOB:          04/26/1946    Ms. Storie returns today.   Her problem list is outlined on June 16, 2007.   She is very stable with current class II chronic systolic heart failure.  She is having no ischemic symptoms and no active symptoms of heart  failure.  She did obtain her driver's license after we filled out forms  and advocated this on her last visit.  She is enjoying visiting her  grandchildren in [place].   She is a little bit emotional this morning about a family issue.   Her Atacand had to be changed to Diovan, but she does not know the dose.  She says her blood pressures have been good since this switch, running  around 120.  She saw Dr. Graciela Husbands on November 16, 2007 for a defibrillator  check.  Her blood pressure at that time was 122/78.   MEDICATIONS:  Otherwise are unchanged.  Please refer to the maintenance  medication list.   PHYSICAL EXAMINATION:  VITAL SIGNS:  Her blood pressure today is 144/87,  her pulse is 88 and regular.  Her weight is 136.  She is very pleasant.  Respiratory rate is 18.  SKIN:  Warm and dry.  HEENT:  Unchanged.  LUNGS:  Clear to auscultation and percussion.  There is no JVD.  She  does have a right carotid bruit.  HEART:  Reveals a regular rate and rhythm without gallop.  ABDOMEN:  Soft with good bowel sounds.  EXTREMITIES:  Reveal no edema.  Pulses are intact.   ASSESSMENT AND PLAN:  Ms. Fuhs is doing well.  I have made no changes  in her program.  I have asked her to make sure that her blood pressure  is under good control.  I will see her back again in 6 months.   Looking back to my notes, I have not noticed a carotid bruit on the  right in the past.  We will plan carotid Dopplers in the meantime.     Thomas C. Daleen Squibb, MD, [facility]  Electronically Signed   TCW/MedQ  DD: 12/16/2007  DT: 12/17/2007  Job #: [id number]   cc:   Karie Schwalbe, MD

## 2010-10-29 NOTE — Assessment & Plan Note (Signed)
[facility] OFFICE NOTE   Christine [name], Christine [name]                          MRN:          [id number]  DATE:06/16/2007                            DOB:          07/04/1945    Christine [name] returns today.   PROBLEM LIST:  1. Coronary artery disease.  She had a large anterior wall infarct on      February 09, 2007 complicated by ventricular fibrillation at rest      with hypoxic brain injury with near complete recovery.  Last      ejection fraction was 39%.  On Myoview July 09, 2006.  There was      no ischemia and a large prior anterior and apical infarct.  2. Chronic systolic congestive heart failure.  She is currently class      2.  3. Mild mitral regurgitation.  4. Peripheral vascular disease.  She is being treated medically.  5. Mixed hyperlipidemia.  Her last lipids were checked at the [facility] and she had a total cholesterol of 154, triglycerides      of 141, LDL of 93, HDL 33.  She only tolerates 10 mg of Crestor a      day.  6. Status post cardiac defibrillator, [employer].  7. Hypertension.  8. She is having no orthopnea, PND, or peripheral edema.  9. She brings an extensive form for the [facility] Department of      Transportation for me to fill out today for her driver's license.      I do think it is safe for her to drive, and I spent about 20      minutes filling this out.   CURRENT MEDS:  1. Aspirin 81 mg a day.  2. Crestor 10 mg a day.  3. Atacand 16 mg a day.  4. Furosemide 40 mg a day.  5. Citrotein 50 mg daily.  6. Lorazepam 0.5 one-half tablet b.i.d.  7. Omeprazole 20 mg b.i.d.  8. Coreg 25 mg b.i.d.   PHYSICAL EXAMINATION:  Today, her blood pressure 126/77, pulse 62 and  regular.  Weight is 124.  HEENT:  Unchanged.  NECK:  Supple.  Carotid upstrokes equal bilaterally without bruits.  No  JVD.  Thyroid is not enlarged.  LUNGS:  Clear.  There are no rales.  HEART:  Reveals a  regular rate and rhythm without gallops.  ABDOMINAL EXAM:  Soft, good bowel sounds.  EXTREMITIES:  Reveal no edema.  Pulses are intact.   Of note, she has lost 10 pounds of weight since we last saw her.  She  took my message of reducing her carbohydrate seriously!   I note that blood work from the [facility] showed a TSH which was  mildly low.  It was recommended to have thyroid function studies done  again in a month.  I have advised her to call the Health Center to get  this done.   ASSESSMENT/PLAN:  I have made no changes in her medical program.  She is  very stable from a cardiovascular and cardiac standpoint.  I filled out  the form for her license.  Total time with the patient was greater than  30 minutes.  We will plan to see her back in 6 months.     Thomas C. Daleen Squibb, MD, [facility]  Electronically Signed    TCW/MedQ  DD: 06/16/2007  DT: 06/16/2007  Job #: [id number]   cc:   [address] [place]  [place]  [address] [address]  [facility]  Dr. Darreld Mclean  Karie Schwalbe, MD
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
## 2010-11-01 NOTE — Op Note (Signed)
NAME:  Christine [name], Christine [name]                 ACCOUNT NO.:  [id number]   MEDICAL RECORD NO.:  [id number]          PATIENT TYPE:  AMB   LOCATION:  SDS                          FACILITY:  [facility]   PHYSICIAN:  Salvadore Farber, M.D. LHCDATE OF BIRTH:  [birth year]   DATE OF PROCEDURE:  10/03/2005  DATE OF DISCHARGE:  10/03/2005                                 OPERATIVE REPORT   PROCEDURE:  Abdominal aortography, selective bilateral renal angiography.   INDICATIONS FOR PROCEDURE:  Christine [name] is a 65 year old woman with  atherosclerotic disease for which she is status post anterior myocardial  infarction in August 2006. She has had persistent hypertension despite  compliance with substantial doses of two medications. Incidentally noted on  abdominal ultrasound was a question of left renal artery stenosis. That  finding prompted a CT angiogram performed August 26, 2005. That demonstrated  an 85% stenosis of the left renal artery and mild stenosis at the ostium of  the right renal artery. She consented to participate in the CORAL trial and  presents for qualifying angiogram.   PROCEDURE TECHNIQUE:  Informed consent was obtained. Underwent 1% lidocaine  local anesthesia, a 5-French sheath was placed in the right common femoral  artery using the modified Seldinger technique. A pigtail catheter was  advanced in the suprarenal abdominal aorta. Abdominal aortography was  performed by power injection. This suggested a severe stenosis of the left  renal artery. The sheath was upsized over a wire to 6-French. A 6-French  LIMA diagnostic catheter was then used to perform selective angiography of  both renal arteries. This demonstrated the left renal artery to be  approximately 50% narrowed and the right renal artery be approximately 20%  narrowed. Based on these findings, she did not qualify for the trial.  We  will plan on conservative management of her renal artery stenosis.   COMPLICATIONS:  None.   FINDINGS:  1.  Left renal artery: Single vessel approximately 50% ostial stenosis.  2.  Right renal artery: Single vessel approximately 20% ostial stenosis.  3.  Abdominal aorta: The abdominal aorta is diffusely diseased with moderate      stenosis in the infrarenal segment. There is no significant stenosis.  4.  Right iliac artery: Approximately 50-60% proximal stenosis.  5.  Left iliac artery less than 20% ostial stenosis.   IMPRESSION/RECOMMENDATIONS:  Moderate left and mild right renal artery  stenosis. Will plan conservative management.      Salvadore Farber, M.D. [facility]  Electronically Signed     WED/MEDQ  D:  10/03/2005  T:  10/04/2005  Job:  [id number]

## 2010-11-01 NOTE — Assessment & Plan Note (Signed)
[facility]                                 ON-CALL NOTE   HOLIDAY, MCMENAMIN                          MRN:          [id number]  DATE:06/14/2006                            DOB:          01-23-1946    On call message.  Patient of Dr.  Vern Claude and a number of other Cardiologists, but also Dr.  Alphonsus Sias, who has recently had heart failure, diagnosed in October, but  has done apparently well since that time, but has been told she has  anxiety attacks and is on sertraline 15 mg either a day or as needed.  She took her pill this evening and her throat became immediately red and  fiery feeling, although no choking or vomiting, fever, and wonders if  that could be a reaction to the medicine. She has no increase in heart  failure symptoms, but has symptoms of acute reflux. Recommended she try  an antacid of some sort tonight and to follow up with her doctor  tomorrow. If she gets any signs of swelling in her throat or respiratory  difficulties she should go to the Emergency Room.     Neta Mends. Fabian Sharp, MD  Electronically Signed    WKP/MedQ  DD: 06/15/2006  DT: 06/15/2006  Job #: [id number]

## 2010-11-01 NOTE — Letter (Signed)
June 11, 2006    Ms. [name]  [address]  [place]  [place]  [address]   RE:  [name], [name]  MRN:  [id number]  /  DOB:  [birth year]   To Whom It May Concern,   Christine [name] is a patient of mine in [place], [place].  She  has coronary artery disease and had a large anterior myocardial  infarction or heart attack.  She has severe impairment of her left  ventricular or pumping function.   During her acute heart attack she suffered hypoxic brain injury.   She has an implantable cardio defibrillator.  She is at high-risk for  sudden death.   I last saw her in the office on 04/03/2006.   In my professional opinion she is permanently and irreversibly disabled.  She has requested a statement from [place] that she is a candidate for credit  card disability program.    Sincerely,      Jesse Sans. Daleen Squibb, MD, [facility]  Electronically Signed    TCW/MedQ  DD: 06/11/2006  DT: 06/11/2006  Job #: [id number]
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
## 2010-11-01 NOTE — Discharge Summary (Signed)
NAME:  Christine [name], Christine [name] NO.:  [id number]   MEDICAL RECORD NO.:  [id number]          PATIENT TYPE:  INP   LOCATION:  3708                         FACILITY:  [facility]   PHYSICIAN:  Salvadore Farber, M.D. LHCDATE OF BIRTH:  21-Mar-1946   DATE OF ADMISSION:  02/08/2005  DATE OF DISCHARGE:  02/25/2005                                 DISCHARGE SUMMARY   ADDENDUM:  Ms. Duffett is being discharged today to [facility] rather  than [facility] as was previously dictated.  All information regarding her  hospitalization, discharge medications remain the same.      Ok Anis, NP      Salvadore Farber, M.D. [facility]  Electronically Signed    CRB/MEDQ  D:  02/25/2005  T:  02/25/2005  Job:  [id number]
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
## 2010-11-01 NOTE — Assessment & Plan Note (Signed)
[facility] OFFICE NOTE   Christine, [name]                          MRN:          [id number]  DATE:07/06/2006                            DOB:          12-Dec-1945    Christine [name] who returns today for further management of the following  issues:  1. Coronary disease status post anterior wall infarct, EF 20% to 25%,      complicated by ventricular fibrillation arrest, hypoxic brain      injury with a significant recovery and cardiogenic shock.  2. Chronic systolic congestive heart failure.  3. Mild mitral regurgitation.  4. History of tobacco use, stopped.  5. History of alcohol use, stopped.  6. Lower extremity peripheral vascular disease primarily localized to      the knees treated medically.  She also has bilateral renal disease.      __________ arteriogram with moderate on the left, mild on the      right, treated medically with Dr. Samule Ohm.  7. Hyperlipidemia.  8. Status post implantation of a cardioverter defibrillator [employer].  9. Hypertension.   Looking back at her acute presentation, she had a totally occluded right  coronary artery and had her LAD stented as a primary infarct vessel on  February 08, 2005.   Looking back, she also had 9% stenosis in the first and second diagonals  and 30% stenosis in the left circumflex.  She had left-to-right  collaterals.  She had 3 bare metal stents placed to the LAD.   She has been doing remarkably well.  She is being careful with salt and  is very compliant with her medications.  Because of some arthralgias,  Dr. Alphonsus Sias discontinued her simvastatin.  He may have to put her back on  Crestor.   Her medications otherwise are unchanged.  Cardiovascular-wise she is on:  1. Coreg 25 b.i.d.  2. Aspirin 325 daily.  3. Furosemide 40 mg daily p.r.n.  4. Atacand 16 mg daily.   Her blood pressure today is 124/81.  Her pulse is 60 and she is paced  atrially.  Her weight  is 124 which is up 5 pounds.  HEENT:  Normocephalic, atraumatic.  PERRLA.  Extraocular movements  intact.  Sclerae clear.  Facial symmetry is normal.  Carotid upstrokes  are equal bilaterally without bruits.  No JVD.  Thyroid is not enlarged.  LUNGS:  Clear.  HEART:  A nondisplaced PMI.  She has a normal S1, S2 without gallop.  ABDOMEN:  Soft with good bowel sounds.  No obvious bruits.  EXTREMITIES:  No edema.  Pulses are present.  NEURO:  Intact and she seems remarkably oriented today and functional.   ASSESSMENT AND PLAN:  Christine [name] is doing remarkably well.  Hopefully she  can get back on a statin.  I have made no changes in her medical  program.  Dr. Alphonsus Sias has been checking blood work.  She is having her  pacer and defibrillator checked over the phone.  I will plan on her  seeing back in 6 months.  We  will obtain a stress Myoview.  I do not see  where this has been done since her acute infarct a year and a half ago,  nearly.  We will use this as a baseline and, hopefully, she will have  very little ischemia even in the lateral territories where diagonals are  stenosed.     Thomas C. Daleen Squibb, MD, [facility]  Electronically Signed    TCW/MedQ  DD: 07/06/2006  DT: 07/06/2006  Job #: [id number]   cc:   Karie Schwalbe, MD
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
## 2010-11-01 NOTE — Assessment & Plan Note (Signed)
[facility] OFFICE NOTE   Christine [name]                          MRN:          [id number]  DATE:04/03/2006                            DOB:          July 27, 1945    Ms. Christine [name] comes today to our [facility] heart care office to establish  with [place].   She has been a former patient of Dr. Samule Ohm in [place], and also Dr.  Sherryl Manges follows her for defibrillator.   PROBLEM LIST:  1. Coronary artery disease, status post anterior wall myocardial      infarction, now with an EF of 20-25%.  This was complicated by      defibrillation arrest, cardiogenic shock, and hypoxic brain injury.  2. Chronic systolic congestive heart failure.  3. Mild mitral regurgitation.  4. History of tobacco use, now not using.  5. History of alcohol use, now not using.  6. Lower extremity peripheral vascular disease, primarily localized to the      knees, treated medically.  7. Hyperlipidemia.  8. Status post implantation of implantable cardioverter/defibrillator St.      Jude.  She last saw Dr. Graciela Husbands in May, 2007.  She is due for annual      followup.  9. Hypertension.   CURRENT MEDICATIONS:  1. Aspirin 325 a day.  2. Coreg 25 b.i.d.  3. Simvastatin 20 mg p.o. nightly.  4. Omeprazole 20 mg p.o. b.i.d.  5. Lorazepam 0.5 1/2 tablet b.i.d.  6. Sertraline 50 mg once daily.  7. Furosemide 40 mg a day.  8. Atacand 16 mg a day.   I do not have records, but she recently had an episode of acute pulmonary  edema and was taken to [facility] a week ago Sunday  by EMS.  She was treated with intravenous Lasix and lost a total of about 11  pounds, she says.  She had an echocardiogram and was told that her EF was  20%.  She ruled out for myocardial infarction.   She has now bathroom scales to weigh herself daily.  Her daily weights have  been around 118-119 over the last week or so since discharge.  She  denies  any orthopnea, PND, or peripheral edema.  She seems to be very compliant.   PHYSICAL EXAMINATION:  VITAL SIGNS:  Her blood pressure today is 110/75.  Her pulse is 60 and regular.  Her weight is 119.  HEENT:  Normocephalic and atraumatic.  PERRLA.  Extraocular movements are  intact.  Facial symmetry is normal.  Dentition satisfactory.  She is  deliberate in her speech and easily confused.  She says she has no patience  and actually before I went into the room, she was getting ready to leave,  having thought she had been seen.  NECK:  Her carotid upstrokes are equal bilaterally without bruits.  There is  no JVD.  Thyroid is not enlarged.  Trachea is midline.  LUNGS:  Clear.  HEART:  Regular rate and rhythm without gallop.  ABDOMEN:  Soft with good bowel  sounds.  There is no hepatomegaly.  EXTREMITIES:  No clubbing, cyanosis or edema.  Pulses were reduced.   ASSESSMENT/PLAN:  Christine [name] is a very complicated lady who unfortunately  experienced a large anterior myocardial infarction complicated by cardiac  arrest, hypoxic brain injury, and cardiogenic shock.  She is now disabled  and has Medicaid.  She seems very compliant, although recently she went into  acute pulmonary edema.  It appears that she had been gaining weight for  several days.   I have spent about 20 minutes talking to her today about the importance of  all of her medications.  I have also stressed weighing herself every day and  how to respond to a progressive weight gain by calling the office for  adjustment of her medications, particularly diuretics.  We have also talked  again about the importance of calling 911 if she has an acute crisis.   I will plan on setting her back up for an office visit here in about three  months.      Thomas C. Daleen Squibb, MD, [facility]     TCW/MedQ  DD:  04/03/2006  DT:  04/06/2006  Job #:  [id number]   cc:   Karie Schwalbe, MD

## 2010-11-01 NOTE — Op Note (Signed)
NAME:  Christine [name], Christine [name] NO.:  [id number]   MEDICAL RECORD NO.:  [id number]          PATIENT TYPE:  INP   LOCATION:  2899                         FACILITY:  [facility]   PHYSICIAN:  Duke Salvia, M.D.  DATE OF BIRTH:  1946/02/06   DATE OF PROCEDURE:  DATE OF DISCHARGE:                                 OPERATIVE REPORT   PREOPERATIVE DIAGNOSES:  1.  Ischemic cardiomyopathy, with ejection fraction of 25%.  2.  Relative bradycardia.   POSTOPERATIVE DIAGNOSES:  1.  Ischemic cardiomyopathy, with ejection fraction of 25%.  2.  Relative bradycardia.   PROCEDURE:  ICD implantation for primary prevention.   Following obtaining informed consent, the patient was brought to the  electrophysiology laboratory and placed on the fluoroscopic table in the  supine position.  After routine prep and drape, lidocaine was infiltrated in  the prepectoral subclavicular region.  An incision was made and carried down  all the way to the prepectoral fascia using electrocautery and sharp  dissection.  A pocket was formed similarly.  Hemostasis was obtained.   Thereafter, attention was turned to getting access to the extrathoracic left  subclavian vein which was accomplished without difficulty.  A single wire  was placed, and a [place] sheath was placed, through which was then passed a  St. Jude Riata 1581 dual-coil active fixation defibrillator lead, serial no.  [id number].  Under fluoroscopic guidance, it was manipulated into the right  ventricular apex, where it was secured with good measurements.  However, on  removing the sheath, which was an 8 [place] sheath, the distal portion of the  sheath got caught on the proximal coil, making it very difficult to remove  it.  As I continued to try to maneuver it, the lead with the extended screw  became unattached from the right ventricular apex.  Serial blood pressures  were concerning, as the blood pressure had fallen from a pre-sedation level  of  130 to 85.  Fluoroscopy demonstrated good movement of the lateral wall.  We had appreciated at the time of sheath implantation that the patient's CVP  was very low, so we gave the patient fluids, gave her [place] to reverse the  sedation, and the blood pressure normalized.  She had no discomfort.  However, this required [place] to reposition the lead with the sheath removed,  and in the final location in the RV apex the R wave was 14 millivolts, with  a pacing impedance of 678 ohms, a threshold of 0.9 volts at 0.5 msec.  Current of threshold was not recorded, and there was no diaphragmatic pacing  at 10 volts.   At this point, it was appreciated that the patient's heart rate was 62-58.  She was on Coreg at only 12.5 mg, and so it was elected to put a right  atrial lead in so as to allow for further uptitration of her Coreg.  Subclavian venous access was again obtained.  Again, we noted a very low  CVP.  A wire was placed, a 7 French sheath was placed, and through this was  passed  a Medtronic 5076, 45 cm active fixation atrial lead, serial no.  [id number].  Under fluoroscopic guidance, it was manipulated to multiple  sites in the right atrium.  Ultimately, we ended up putting it down on the  right atrial free wall where the bipolar P wave was 1.3 millivolts, with a  pacing impedance of 670 ohms, but the threshold was adequate at 1.3 volts at  0.5 msec.  Again, there was no diaphragmatic pacing at 10 volts.  With these  acceptable parameters recorded, the leads were secured to a St. Jude Atlas  Plus V243 ICD.  Through the device, the bipolar P wave was 1, with a pacing  impedance of 510, a threshold of 1 volt at 0.5 msec.  The R wave was 12,  with impedance of 640, threshold of 0.5 at 0.5.  At this point, the patient  was re-sedated.  The 1-joule test shock was delivered through an impedance  of 37 ohms.   The pocket was copiously irrigated with antibiotic-containing saline  solution.   Hemostasis was ensured, and the leads and the pulse generator  were placed in the pocket and secured to the prepectoral fascia.  At this  point, ventricular fibrillation was induced using a T wave shock.  After a  total duration of 6 seconds, a 15-joule shock was delivered through a  measured resistance of 44 ohms, terminating ventricular fibrillation and  restoring an A-paced rhythm.  Because the patient's blood pressure again  fell with sedation, we elected to shock her only once.  The pocket was then  closed in 3 layers in the normal fashion.  The wound was washed and dried,  and Benzoin and Steri-Strip dressing was applied.  Needle counts, sponge  counts, and instrument counts were correct at the end of the procedure.   DFT was estimated at 20 joules instead of 15 because of the single shock.   The device was programmed accordingly.           ______________________________  Duke Salvia, M.D.     SCK/MEDQ  D:  07/17/2005  T:  07/17/2005  Job:  [id number]   cc:   Salvadore Farber, M.D. [facility]  [address]  [address]  [place]  [place] [address]   Electrophysiology Laboratory   [facility]

## 2010-11-01 NOTE — H&P (Signed)
NAME:  Christine [name], Christine [name]                 ACCOUNT NO.:  [id number]   MEDICAL RECORD NO.:  [id number]          PATIENT TYPE:  INP   LOCATION:  2921                         FACILITY:  [facility]   PHYSICIAN:  Anna Genre. Maisie Fus, M.D. [facility] OF BIRTH:  02/08/2004   DATE OF ADMISSION:  02/08/2005  DATE OF DISCHARGE:                                HISTORY & PHYSICAL   ADDENDUM:  The initial admission date was February 08, 2005.  The Addendum is  February 09, 2005.   Please correct the patient's age.  She is 89, not 43, as we received further  information when her family arrived.   PHYSICAL EXAMINATION:  HEENT:  Sclerae anicteric.  Head is atraumatic and  normocephalic.  She has no oral mucosal lesions.  There is no erythema or  exudate.  She has an ET tube in place which is secured and fastened.  NECK:  There is no tracheal deviation.  JVP is mildly elevated.  LUNGS:  Anteriorly she has bilateral crackles.  CARDIOVASCULAR:  Exam shows a regular rhythm.  She has no audible murmurs,  rubs, or gallops.  ABDOMEN:  Soft, nontender.  She has good bowel sounds.  EXTREMITIES:  Somewhat cool.  There is no edema.  She has a balloon pump in  place inserted in the left femoral artery.   LABORATORY DATA:  Notable for potassium of 3.9.  Cardiac markers: Troponin  initially was 2.91, second one 82.35.  CK-MB showed a total CK of 3881 and  MB of 286.  Her creatinine was 0.8.           ______________________________  Anna Genre. Maisie Fus, M.D. [facility]     KLT/MEDQ  D:  02/09/2005  T:  02/09/2005  Job:  [id number]
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
## 2010-11-01 NOTE — Assessment & Plan Note (Signed)
[facility]                            CARDIOLOGY OFFICE NOTE   Christine [name], Christine [name]                          MRN:          [id number]  DATE:10/17/2006                            DOB:          1946/05/21    Christine [name] returns today for management of the following problems.  1. Coronary artery disease.  Status post large anterior wall MI,      complicated by ventricular fibrillation arrest, hypoxic brain      injury with significant recovery.  Her last Myoview July 09, 2006, EF of 39%, large prior anterior and apical infarct, no      ischemia.  2. Chronic systolic congestive heart failure.  She is currently      asymptomatic.  3. Mild mitral regurgitation.  4. Peripheral vascular disease of the lower extremities.  This is      being treated medically.  5. Mixed hyperlipidemia.  Her last lipid panel showed a profound      increase in VLDL, as well as her triglycerides.  Her LDL is not at      goal, but I see she is back on Crestor at low dose, 10 mg a day.      She admits to dietary indiscretion.  Her weight is up about 8 to 10      pounds.  She really likes carbohydrates!  6. Status post implantation of cardioverter defibrillator, [employer]. [employer].  7. Hypertension.   MEDICATIONS:  1. Carvedilol 25 mg p.o. b.i.d.  2. Omeprazole 20 mg p.o. b.i.d.  3. Lorazepam 0.5 one-half tab b.i.d.  4. Sertraline 50 mg p.o. daily.  5. Furosemide 40 mg a day.  6. Atacand 16 mg a day.  7. Multivite.  8. Crestor 10 mg a day.  9. Enteric-coated aspirin 81 mg a day.   PHYSICAL EXAM:  She looks good today.  Her blood pressure is actually increased at 142/88, pulse 60 and  regular.  Her electrocardiogram shows electronic atrial pacing with old  anterior wall infarct, stable pattern.  Her weight is up 9 pounds to  133.  SKIN:  Warm and dry.  PERRLA.  Extraocular muscles are intact.  Sclerae clear.  Facial  symmetry is normal.  Dentition satisfactory.  NECK:  Supple.   Carotids are full without bruits.  There is no JVD.  Thyroid is not enlarged.  Trachea is midline.  LUNGS:  Clear.  HEART:  Reveals a slightly displaced PMI.  She has no obvious gallop.  There is a soft systolic murmur at the apex.  ABDOMEN:  Soft with good bowel sounds.  EXTREMITIES:  No edema.  Pulses are present.  NEUROLOGIC:  Grossly intact.   ASSESSMENT AND PLAN:  Christine [name] is stable from a cardiovascular  standpoint.  I am concerned about her lipids and her weight gain.  She  is also plagued by a lot of arthritis in her back and her hips.  We have  gone over a general dietary plan to decrease simple and complex  carbohydrates and for weight reduction.  Dr.  Alphonsus Sias may wish to increase  her Crestor to 20 daily, but she has not tolerated statins in the past.   I will plan on seeing her back in 6 months.     Thomas C. Daleen Squibb, MD, [facility]  Electronically Signed    TCW/MedQ  DD: 11/17/2006  DT: 11/17/2006  Job #: [id number]   cc:   Karie Schwalbe, MD

## 2010-11-17 ENCOUNTER — Emergency Department: Payer: Medicare Other | Admitting: Internal Medicine

## 2010-11-28 ENCOUNTER — Encounter: Payer: Self-pay | Admitting: Cardiovascular Disease

## 2011-02-18 ENCOUNTER — Encounter: Payer: Self-pay | Admitting: Internal Medicine

## 2011-03-06 ENCOUNTER — Encounter: Payer: Self-pay | Admitting: Internal Medicine

## 2011-03-07 ENCOUNTER — Inpatient Hospital Stay: Payer: Medicare Other | Admitting: Internal Medicine

## 2011-03-08 DIAGNOSIS — I369 Nonrheumatic tricuspid valve disorder, unspecified: Secondary | ICD-10-CM

## 2011-03-08 DIAGNOSIS — R079 Chest pain, unspecified: Secondary | ICD-10-CM

## 2011-03-09 DIAGNOSIS — I2510 Atherosclerotic heart disease of native coronary artery without angina pectoris: Secondary | ICD-10-CM

## 2011-03-11 DIAGNOSIS — I471 Supraventricular tachycardia: Secondary | ICD-10-CM

## 2011-03-17 ENCOUNTER — Ambulatory Visit (INDEPENDENT_AMBULATORY_CARE_PROVIDER_SITE_OTHER): Payer: Medicare Other | Admitting: *Deleted

## 2011-03-17 ENCOUNTER — Ambulatory Visit: Payer: Medicare Other | Admitting: *Deleted

## 2011-03-17 ENCOUNTER — Other Ambulatory Visit: Payer: Self-pay | Admitting: Cardiovascular Disease

## 2011-03-17 DIAGNOSIS — I8290 Acute embolism and thrombosis of unspecified vein: Secondary | ICD-10-CM | POA: Insufficient documentation

## 2011-03-17 DIAGNOSIS — I749 Embolism and thrombosis of unspecified artery: Secondary | ICD-10-CM

## 2011-03-20 ENCOUNTER — Encounter: Payer: Self-pay | Admitting: Internal Medicine

## 2011-03-21 ENCOUNTER — Telehealth: Payer: Self-pay | Admitting: *Deleted

## 2011-03-21 NOTE — Telephone Encounter (Signed)
Pt seen on Monday of this week, new coumadin start INR 2.9. Do not have documentation showing how much warfarin she was given in [facility], per coumadin note pt's dose was unchanged and she was scheduled to come for next visit in 1 week. After discussing with coumadin clinic, they advised she come in next coumadin clinic day (wed this week) 2 days post eval on Monday this week. I have attempted to contact pt and LMOM x 3 with no return call to notify her to come in this week for repeat INR. Otherwise, pt is scheduled to have rechecked on 03/26/11.

## 2011-03-25 ENCOUNTER — Ambulatory Visit: Payer: Medicare Other | Admitting: Internal Medicine

## 2011-03-26 ENCOUNTER — Encounter: Payer: Medicare Other | Admitting: Emergency Medicine

## 2011-03-26 ENCOUNTER — Telehealth: Payer: Self-pay

## 2011-03-26 LAB — CANCER ANTIGEN 27.29: CA 27.29: 15 U/mL (ref 0.0–38.6)

## 2011-03-26 NOTE — Telephone Encounter (Signed)
Pt called [place] office and cancelled new pt appointment for today 03/26/11 secondary to no transportation.  Pt did not r/s appt stated she will call back when she gets a ride.  Pt was started on Coumadin in the hospital.  INR checked on 03/17/11 2.9 continued pt on %mg daily, but wanted pt to f/u 03/19/11.  Pt made f/u appt on 03/26/11, but cancelled.  Pt needs to have INR checked ASAP.  Attempted to call pt and notify, mobile # has been disconnected, contacts phone # has been disconnected found another contact # in [facility] record attempted to contact pt.  LMOM TCB.

## 2011-03-28 ENCOUNTER — Ambulatory Visit: Payer: Medicare Other | Admitting: Internal Medicine

## 2011-04-01 ENCOUNTER — Telehealth: Payer: Self-pay | Admitting: *Deleted

## 2011-04-01 NOTE — Telephone Encounter (Signed)
Attempted to contact pt, LMOM TCB. Pt needs to reschedule coumadin appt, she missed last appt and has had only one check since coumadin was started in hospital.

## 2011-04-03 ENCOUNTER — Telehealth: Payer: Self-pay | Admitting: Internal Medicine

## 2011-04-03 ENCOUNTER — Ambulatory Visit (INDEPENDENT_AMBULATORY_CARE_PROVIDER_SITE_OTHER): Payer: Medicare Other | Admitting: *Deleted

## 2011-04-03 DIAGNOSIS — I8290 Acute embolism and thrombosis of unspecified vein: Secondary | ICD-10-CM

## 2011-04-03 DIAGNOSIS — I749 Embolism and thrombosis of unspecified artery: Secondary | ICD-10-CM

## 2011-04-03 DIAGNOSIS — Z7901 Long term (current) use of anticoagulants: Secondary | ICD-10-CM

## 2011-04-03 LAB — POCT INR: INR: 7.1

## 2011-04-03 MED ORDER — VITAMIN K 100 MCG PO TABS: 5.0000 mg | ORAL_TABLET | Freq: Every day | ORAL | Status: AC

## 2011-04-03 NOTE — Telephone Encounter (Signed)
Leigh at DR. Wilton Shaver's office called and needs surgical clearance for patient to have port placed for chemo.  Local anesthesia will be used.  Patient currently on aspirin and coumadin per DR. Pereda's office.  Can patient stop this prior to procedure.  Please fax clearance to Leigh at [phone].  Her number is [phone] ext [phone]

## 2011-04-03 NOTE — Telephone Encounter (Signed)
I called Leigh at Dr. Michaelle Copas office, notified her that we have not been able to reach pt to have INR evaluated s/p starting coumadin in hospital. She had a new phone number for pt: [phone]. I added to her chart and contacted pt. Explained importance of checking her levels today, and the risks of her having abnormal INR levels. Pt states she will be here today before 4:30pm. Will then discuss clearance after Dr. Mariah Milling evaluates.

## 2011-04-04 LAB — PROTIME-INR

## 2011-04-04 NOTE — Telephone Encounter (Signed)
Pt came into [place] office 04/03/11 and had INR checked see anticoag note.

## 2011-04-07 ENCOUNTER — Encounter: Payer: Medicare Other | Admitting: *Deleted

## 2011-04-07 ENCOUNTER — Telehealth: Payer: Self-pay | Admitting: *Deleted

## 2011-04-07 ENCOUNTER — Ambulatory Visit: Payer: Medicare Other | Admitting: Cardiovascular Disease

## 2011-04-07 NOTE — Telephone Encounter (Signed)
Pt no-showed f/u appt today with Dr. Mariah Milling and her INR check. Pt was instructed on Friday to hold coumadin until today, and take vitamin k on Friday due to elevated INR level. Pt was strongly advised to make today's appt for INR recheck and risks non-therepeutic INR levels were discussed with pt in detail. Pt had verbalized understanding.  Also spoke to [facility] at Dr. Murriel Hopper Corrow's office at [facility], she states pt did not show for her pre-op appt for port-a-cath as well. Per Dr. Mariah Milling, we will send clearance stating if pt has continued to hold coumadin, she will need to do so until Thursday 10/25 for port placement, and will need f/u afterward here for eval for change in anticoagulation. Pt will probably be placed on Pradaxa as she has now no-showed twice on new coumadin start.  I left msg with pt on her vm today, and notified Leigh at [facility] to have pt call if she is able to reach her.

## 2011-04-09 ENCOUNTER — Telehealth: Payer: Self-pay | Admitting: *Deleted

## 2011-04-09 NOTE — Telephone Encounter (Signed)
Called [facility] pharmacy in [place] to inactivate any warfarin refills available. Pt has not followed up for INR check after abnormal values.

## 2011-04-10 ENCOUNTER — Encounter: Payer: Medicare Other | Admitting: Internal Medicine

## 2011-04-10 ENCOUNTER — Encounter: Payer: Self-pay | Admitting: Cardiovascular Disease

## 2011-04-17 ENCOUNTER — Ambulatory Visit: Payer: Medicare Other | Admitting: Internal Medicine

## 2011-04-18 ENCOUNTER — Encounter: Payer: Self-pay | Admitting: Internal Medicine

## 2011-06-16 ENCOUNTER — Ambulatory Visit: Payer: Medicare Other | Admitting: Cardiovascular Disease

## 2011-06-20 ENCOUNTER — Ambulatory Visit: Payer: Self-pay | Admitting: Surgery

## 2011-07-17 ENCOUNTER — Ambulatory Visit: Payer: Self-pay

## 2011-07-30 ENCOUNTER — Ambulatory Visit: Payer: Self-pay | Admitting: Internal Medicine

## 2011-07-30 LAB — COMPREHENSIVE METABOLIC PANEL
Albumin: 3.5 g/dL (ref 3.4–5.0)
Calcium, Total: 8.5 mg/dL (ref 8.5–10.1)
Chloride: 106 mmol/L (ref 98–107)
Co2: 30 mmol/L (ref 21–32)
EGFR (Non-African Amer.): >60
Glucose: 95 mg/dL (ref 65–99)
Osmolality: 285 (ref 275–301)
Potassium: 4.6 mmol/L (ref 3.5–5.1)
SGOT(AST): 17 U/L (ref 15–37)
Sodium: 143 mmol/L (ref 136–145)

## 2011-07-30 LAB — CBC CANCER CENTER
Basophil #: 0 x10 3/mm (ref 0.0–0.1)
Basophil %: 0.4 %
Eosinophil %: 1.6 %
HGB: 12.4 g/dL (ref 12.0–16.0)
Lymphocyte #: 1.8 x10 3/mm (ref 1.0–3.6)
Lymphocyte %: 28.2 %
MCH: 32.4 pg (ref 26.0–34.0)
MCHC: 34 g/dL (ref 32.0–36.0)
Neutrophil %: 64.6 %
Platelet: 134 x10 3/mm — ABNORMAL LOW (ref 150–440)
RDW: 14.3 % (ref 11.5–14.5)

## 2011-08-04 LAB — CBC CANCER CENTER
Basophil #: 0 x10 3/mm (ref 0.0–0.1)
Eosinophil #: 0 x10 3/mm (ref 0.0–0.7)
HCT: 36.2 % (ref 35.0–47.0)
Lymphocyte #: 0.6 x10 3/mm — ABNORMAL LOW (ref 1.0–3.6)
Lymphocyte %: 8.9 %
MCH: 32.7 pg (ref 26.0–34.0)
MCHC: 34.2 g/dL (ref 32.0–36.0)
MCV: 96 fL (ref 80–100)
Monocyte #: 0.1 x10 3/mm (ref 0.0–0.7)
Neutrophil #: 6.1 x10 3/mm (ref 1.4–6.5)
Platelet: 132 x10 3/mm — ABNORMAL LOW (ref 150–440)
RDW: 14.5 % (ref 11.5–14.5)
WBC: 6.9 x10 3/mm (ref 3.6–11.0)

## 2011-08-14 ENCOUNTER — Emergency Department: Payer: Self-pay | Admitting: Emergency Medicine

## 2011-08-15 ENCOUNTER — Ambulatory Visit: Payer: Self-pay | Admitting: Internal Medicine

## 2011-08-26 ENCOUNTER — Ambulatory Visit: Payer: Self-pay | Admitting: Vascular Surgery

## 2011-08-26 LAB — BASIC METABOLIC PANEL
BUN: 9 mg/dL (ref 7–18)
Chloride: 103 mmol/L (ref 98–107)
Co2: 27 mmol/L (ref 21–32)
EGFR (Non-African Amer.): >60
Glucose: 99 mg/dL (ref 65–99)
Osmolality: 280 (ref 275–301)
Potassium: 3.9 mmol/L (ref 3.5–5.1)

## 2011-08-28 ENCOUNTER — Ambulatory Visit: Payer: Medicare Other | Admitting: Cardiovascular Disease

## 2011-08-30 ENCOUNTER — Observation Stay: Payer: Self-pay

## 2011-08-30 LAB — CBC WITH DIFFERENTIAL/PLATELET
Basophil #: 0 10*3/uL (ref 0.0–0.1)
Basophil %: 0.4 %
Eosinophil #: 0.1 10*3/uL (ref 0.0–0.7)
Eosinophil %: 1.5 %
HCT: 26.3 % — ABNORMAL LOW (ref 35.0–47.0)
HGB: 8.7 g/dL — ABNORMAL LOW (ref 12.0–16.0)
Lymphocyte #: 1.2 10*3/uL (ref 1.0–3.6)
MCH: 32 pg (ref 26.0–34.0)
MCHC: 33.1 g/dL (ref 32.0–36.0)
MCV: 97 fL (ref 80–100)
Monocyte #: 0.5 10*3/uL (ref 0.0–0.7)
Monocyte %: 7.7 %
Platelet: 188 10*3/uL (ref 150–440)
RBC: 2.73 10*6/uL — ABNORMAL LOW (ref 3.80–5.20)

## 2011-08-30 LAB — BASIC METABOLIC PANEL
Anion Gap: 8 (ref 7–16)
BUN: 10 mg/dL (ref 7–18)
Calcium, Total: 8.5 mg/dL (ref 8.5–10.1)
Chloride: 107 mmol/L (ref 98–107)
Co2: 29 mmol/L (ref 21–32)
Creatinine: 0.57 mg/dL — ABNORMAL LOW (ref 0.60–1.30)
EGFR (African American): >60
Potassium: 3.5 mmol/L (ref 3.5–5.1)

## 2011-08-30 LAB — HEMOGLOBIN: HGB: 9.6 g/dL — ABNORMAL LOW (ref 12.0–16.0)

## 2011-08-31 LAB — BASIC METABOLIC PANEL
Chloride: 109 mmol/L — ABNORMAL HIGH (ref 98–107)
Co2: 27 mmol/L (ref 21–32)
Creatinine: 0.69 mg/dL (ref 0.60–1.30)
EGFR (African American): >60
Osmolality: 281 (ref 275–301)
Potassium: 3.7 mmol/L (ref 3.5–5.1)
Sodium: 142 mmol/L (ref 136–145)

## 2011-08-31 LAB — CBC WITH DIFFERENTIAL/PLATELET
Basophil #: 0 10*3/uL (ref 0.0–0.1)
Eosinophil #: 0.1 10*3/uL (ref 0.0–0.7)
Eosinophil %: 1.9 %
HCT: 23.7 % — ABNORMAL LOW (ref 35.0–47.0)
Lymphocyte #: 0.9 10*3/uL — ABNORMAL LOW (ref 1.0–3.6)
Lymphocyte %: 15.1 %
MCV: 96 fL (ref 80–100)
Monocyte %: 8.1 %
Neutrophil #: 4.2 10*3/uL (ref 1.4–6.5)
Neutrophil %: 74.3 %
Platelet: 180 10*3/uL (ref 150–440)
RBC: 2.48 10*6/uL — ABNORMAL LOW (ref 3.80–5.20)
RDW: 14.9 % — ABNORMAL HIGH (ref 11.5–14.5)
WBC: 5.7 10*3/uL (ref 3.6–11.0)

## 2011-08-31 LAB — IRON AND TIBC
Iron Bind.Cap.(Total): 166 ug/dL — ABNORMAL LOW (ref 250–450)
Iron Saturation: 22 %
Unbound Iron-Bind.Cap.: 129 ug/dL

## 2011-08-31 LAB — TROPONIN I: Troponin-I: <0.02 ng/mL

## 2011-08-31 LAB — FERRITIN: Ferritin (ARMC): 279 ng/mL (ref 8–388)

## 2011-08-31 LAB — LIPID PANEL
Cholesterol: 105 mg/dL (ref 0–200)
Triglycerides: 105 mg/dL (ref 0–200)

## 2011-08-31 LAB — RETICULOCYTES
Absolute Retic Count: 0.073 10*6/uL (ref 0.024–0.084)
Reticulocyte: 2.9 % — ABNORMAL HIGH (ref 0.5–1.5)

## 2011-09-15 ENCOUNTER — Encounter: Payer: Self-pay | Admitting: Cardiovascular Disease

## 2011-09-19 ENCOUNTER — Ambulatory Visit: Payer: Self-pay | Admitting: Internal Medicine

## 2011-09-26 LAB — COMPREHENSIVE METABOLIC PANEL
BUN: 14 mg/dL (ref 7–18)
Bilirubin,Total: 0.3 mg/dL (ref 0.2–1.0)
Chloride: 105 mmol/L (ref 98–107)
Co2: 24 mmol/L (ref 21–32)
Creatinine: 0.97 mg/dL (ref 0.60–1.30)
EGFR (African American): >60
EGFR (Non-African Amer.): >60
Glucose: 123 mg/dL — ABNORMAL HIGH (ref 65–99)
Potassium: 4.1 mmol/L (ref 3.5–5.1)
SGOT(AST): 17 U/L (ref 15–37)
SGPT (ALT): 19 U/L
Sodium: 141 mmol/L (ref 136–145)
Total Protein: 7.5 g/dL (ref 6.4–8.2)

## 2011-09-26 LAB — CBC CANCER CENTER
Basophil #: 0 x10 3/mm (ref 0.0–0.1)
Basophil %: 0.1 %
Eosinophil #: 0 x10 3/mm (ref 0.0–0.7)
Eosinophil %: 0 %
HCT: 35.3 % (ref 35.0–47.0)
HGB: 11.5 g/dL — ABNORMAL LOW (ref 12.0–16.0)
Lymphocyte #: 0.4 x10 3/mm — ABNORMAL LOW (ref 1.0–3.6)
MCH: 31.1 pg (ref 26.0–34.0)
MCHC: 32.5 g/dL (ref 32.0–36.0)
Monocyte #: 0.1 x10 3/mm — ABNORMAL LOW (ref 0.2–0.9)
Monocyte %: 1.7 %
Neutrophil #: 5.2 x10 3/mm (ref 1.4–6.5)
Platelet: 134 x10 3/mm — ABNORMAL LOW (ref 150–440)

## 2011-10-15 ENCOUNTER — Ambulatory Visit: Payer: Self-pay | Admitting: Internal Medicine

## 2011-10-15 ENCOUNTER — Ambulatory Visit: Payer: Medicare Other | Admitting: Cardiovascular Disease

## 2011-10-17 ENCOUNTER — Encounter: Payer: Self-pay | Admitting: Cardiovascular Disease

## 2013-11-25 DIAGNOSIS — F329 Major depressive disorder, single episode, unspecified: Secondary | ICD-10-CM | POA: Insufficient documentation

## 2013-11-25 DIAGNOSIS — F32A Depression, unspecified: Secondary | ICD-10-CM | POA: Insufficient documentation

## 2013-11-25 DIAGNOSIS — F419 Anxiety disorder, unspecified: Secondary | ICD-10-CM | POA: Insufficient documentation

## 2013-11-25 DIAGNOSIS — C50919 Malignant neoplasm of unspecified site of unspecified female breast: Secondary | ICD-10-CM | POA: Insufficient documentation

## 2014-01-09 ENCOUNTER — Ambulatory Visit: Payer: Self-pay | Admitting: Internal Medicine

## 2014-01-17 ENCOUNTER — Ambulatory Visit: Payer: Self-pay | Admitting: Internal Medicine

## 2014-01-17 LAB — CBC CANCER CENTER
BASOS PCT: 0.8 %
Basophil #: 0 x10 3/mm (ref 0.0–0.1)
Eosinophil #: 0.2 x10 3/mm (ref 0.0–0.7)
Eosinophil %: 2.8 %
HCT: 37.4 % (ref 35.0–47.0)
HGB: 12.6 g/dL (ref 12.0–16.0)
LYMPHS ABS: 1.7 x10 3/mm (ref 1.0–3.6)
Lymphocyte %: 28.9 %
MCH: 29.9 pg (ref 26.0–34.0)
MCHC: 33.7 g/dL (ref 32.0–36.0)
MCV: 89 fL (ref 80–100)
MONO ABS: 0.4 x10 3/mm (ref 0.2–0.9)
Monocyte %: 6.5 %
Neutrophil #: 3.6 x10 3/mm (ref 1.4–6.5)
Neutrophil %: 61 %
PLATELETS: 114 x10 3/mm — AB (ref 150–440)
RBC: 4.21 10*6/uL (ref 3.80–5.20)
RDW: 15.3 % — ABNORMAL HIGH (ref 11.5–14.5)
WBC: 5.9 x10 3/mm (ref 3.6–11.0)

## 2014-01-17 LAB — COMPREHENSIVE METABOLIC PANEL
Albumin: 3.5 g/dL (ref 3.4–5.0)
Alkaline Phosphatase: 96 U/L
Anion Gap: 7 (ref 7–16)
BILIRUBIN TOTAL: 0.4 mg/dL (ref 0.2–1.0)
BUN: 6 mg/dL — ABNORMAL LOW (ref 7–18)
CHLORIDE: 107 mmol/L (ref 98–107)
CREATININE: 0.84 mg/dL (ref 0.60–1.30)
Calcium, Total: 8.5 mg/dL (ref 8.5–10.1)
Co2: 27 mmol/L (ref 21–32)
EGFR (African American): >60
Glucose: 86 mg/dL (ref 65–99)
Osmolality: 278 (ref 275–301)
Potassium: 3.6 mmol/L (ref 3.5–5.1)
SGOT(AST): 26 U/L (ref 15–37)
SGPT (ALT): 14 U/L
Sodium: 141 mmol/L (ref 136–145)
TOTAL PROTEIN: 6.8 g/dL (ref 6.4–8.2)

## 2014-01-17 LAB — APTT: ACTIVATED PTT: 37.3 s — AB (ref 23.6–35.9)

## 2014-01-17 LAB — PROTIME-INR
INR: 1.1
Prothrombin Time: 13.7 secs (ref 11.5–14.7)

## 2014-01-18 LAB — CANCER ANTIGEN 27.29: CA 27.29: 12.5 U/mL (ref 0.0–38.6)

## 2014-01-26 ENCOUNTER — Ambulatory Visit: Payer: Self-pay | Admitting: Internal Medicine

## 2014-01-30 ENCOUNTER — Telehealth: Payer: Self-pay

## 2014-01-30 NOTE — Telephone Encounter (Signed)
Pt states she is having a masectomy by Dr. Rochel Brome, pt has not set appt time yet, States the mass is right next to her defib/pacer. Wanted to let Dr. Caryl Comes know. I informed pt she more than likely need to see Dr. Caryl Comes, but would take the message, due to pt wanted to talk with the nurse. Please call.

## 2014-01-31 NOTE — Telephone Encounter (Signed)
Attempted to call  to inform patient that Dr. Caryl Comes would like to see her in the office before her procedure No answer

## 2014-02-06 NOTE — Telephone Encounter (Signed)
Patient returned call  Follow up appt made

## 2014-02-14 ENCOUNTER — Ambulatory Visit: Payer: Self-pay | Admitting: Internal Medicine

## 2014-02-17 ENCOUNTER — Ambulatory Visit (INDEPENDENT_AMBULATORY_CARE_PROVIDER_SITE_OTHER): Payer: Medicare Other | Admitting: Internal Medicine

## 2014-02-17 ENCOUNTER — Other Ambulatory Visit: Payer: Self-pay | Admitting: Internal Medicine

## 2014-02-17 ENCOUNTER — Encounter: Payer: Self-pay | Admitting: Internal Medicine

## 2014-02-17 VITALS — BP 156/89 | HR 73 | Ht 62.0 in | Wt 101.0 lb

## 2014-02-17 DIAGNOSIS — I5022 Chronic systolic (congestive) heart failure: Secondary | ICD-10-CM

## 2014-02-17 DIAGNOSIS — Z95810 Presence of automatic (implantable) cardiac defibrillator: Secondary | ICD-10-CM

## 2014-02-17 DIAGNOSIS — I10 Essential (primary) hypertension: Secondary | ICD-10-CM

## 2014-02-17 DIAGNOSIS — C50919 Malignant neoplasm of unspecified site of unspecified female breast: Secondary | ICD-10-CM

## 2014-02-17 DIAGNOSIS — C50912 Malignant neoplasm of unspecified site of left female breast: Secondary | ICD-10-CM

## 2014-02-17 DIAGNOSIS — I2589 Other forms of chronic ischemic heart disease: Secondary | ICD-10-CM

## 2014-02-17 LAB — MDC_IDC_ENUM_SESS_TYPE_INCLINIC
Battery Voltage: 2.4 V
Brady Statistic RA Percent Paced: 0 %
HIGH POWER IMPEDANCE MEASURED VALUE: 37 Ohm
Implantable Pulse Generator Serial Number: 333576
Lead Channel Impedance Value: 550 Ohm
Lead Channel Sensing Intrinsic Amplitude: 12.1 mV
Lead Channel Setting Pacing Amplitude: 2 V
Lead Channel Setting Pacing Amplitude: 2.5 V
Lead Channel Setting Pacing Pulse Width: 0.5 ms
MDC IDC MSMT LEADCHNL RA IMPEDANCE VALUE: 455 Ohm
MDC IDC MSMT LEADCHNL RA SENSING INTR AMPL: 1.3 mV
MDC IDC SESS DTM: 20150904172638
MDC IDC SET LEADCHNL RV SENSING SENSITIVITY: 0.3 mV
MDC IDC SET ZONE DETECTION INTERVAL: 300 ms
MDC IDC STAT BRADY RV PERCENT PACED: 0 %
Zone Setting Detection Interval: 250 ms
Zone Setting Detection Interval: 375 ms

## 2014-02-17 NOTE — Progress Notes (Signed)
ELECTROPHYSIOLOGY CONSULT NOTE  Patient ID: Christine [name], MRN: [id number], DOB/AGE: 67-06-1945 67 y.o. Admit date: (Not on file) Date of Consult: 02/17/2014  Primary Physician: Glendon Axe, MD Primary Cardiologist: new   Chief Complaint: Move my ICD   HPI Christine [name] is a 68 y.o. female   seen after a hiatus of many years. She has a history of ischemic heart disease with prior myocardial infarction complicated by cardiac arrest for which she underwent ICD implantation.  We have not seen her >6 yrs   She was diagnosed with breast Ca 2010 or so and ended up with initial rx at [facility] where her oncologist reportedly chose to cease to follow her for compliance issues.  She then was treated for while here and in [place]  She has been here now since 2012 and she is extremely unsure as to what the right treatment strategy is for her breast cancer   She is referred today by surgery who anticipates mastectomy and is requesting removal of the device prior to surgery  She has chronic mild DOE and no chest pain  Echocardiogram 2012 demonstrated left ventricular function 25-30% to moderate size apical thrombus is described as with a stalk.  Her course over the years has also been notable for cellulitis of her lower ext which prompted fem-fem bypasss(no records)  No device therapy over the years       Past Medical History  Diagnosis Date  . Allergic rhinitis   . Anxiety   . Coronary artery disease   . GERD (gastroesophageal reflux disease)   . Hyperlipidemia   . Hypertension   . Osteoarthritis   . Chronic systolic heart failure   . Heart murmur   . MI (myocardial infarction)   . Breast cancer   . Implantable defibrillator St Jude     DOI 2006      Surgical History:  Past Surgical History  Procedure Laterality Date  . Coronary stent placement  8/06    3 stents; myocardial infarction  . Pacemaker placement  2/07    Defibrillator  . Chf exacerbation  10/07  . Nm  myoview ltd  1/08    EF 39%, no sx ischemia     Home Meds: Prior to Admission medications   Medication Sig Start Date End Date Taking? Authorizing Provider  Acetaminophen 500 MG coapsule Take by mouth as needed.     Yes Historical Provider, MD  albuterol (PROAIR HFA) 108 (90 BASE) MCG/ACT inhaler Inhale 2 puffs into the lungs as needed.     Yes Historical Provider, MD  carvedilol (COREG) 25 MG tablet TAKE 1 TABLET BY MOUTH TWICE A DAY 09/09/10  Yes Venia Carbon, MD  Cholecalciferol (VITAMIN D) 2000 UNITS tablet Take 2,000 Units by mouth daily.   Yes Historical Provider, MD  DIOVAN 320 MG tablet TAKE 1 TABLET BY MOUTH EVERY DAY 09/09/10  Yes Venia Carbon, MD  furosemide (LASIX) 40 MG tablet Take 40 mg by mouth daily as needed.     Yes Historical Provider, MD  LORazepam (ATIVAN) 0.5 MG tablet Take 0.5 mg by mouth 2 (two) times daily as needed.     Yes Historical Provider, MD  omeprazole (PRILOSEC) 20 MG capsule Take 20 mg by mouth 2 (two) times daily.     Yes Historical Provider, MD  rosuvastatin (CRESTOR) 10 MG tablet Take 10 mg by mouth at bedtime.     Yes Historical Provider, MD  sodium chloride (OCEAN) 0.65 % nasal  spray Place 2 sprays into the nose every 4 (four) hours as needed. In each nostril.    Yes Historical Provider, MD  traMADol (ULTRAM) 50 MG tablet Take 50 mg by mouth as needed.   Yes Historical Provider, MD     Allergies:  Allergies  Allergen Reactions  . Atorvastatin     REACTION: increased LFT's  . Lisinopril     REACTION: cough  . Oxycodone Hcl     REACTION: unspecified  . Simvastatin     REACTION: myalgias    History   Social History  . Marital Status: Divorced    Spouse Name: N/A    Number of Children: 1  . Years of Education: N/A   Occupational History  . Floral designer     Disabled   Social History Main Topics  . Smoking status: Current Every Day Smoker -- 0.50 packs/day  . Smokeless tobacco: Not on file  . Alcohol Use: No     Comment: 2-3  times per month  . Drug Use: No  . Sexual Activity: Not on file   Other Topics Concern  . Not on file   Social History Narrative   Pt is disabled but is hoping to back to work part time.   Has been divorced twice.   Gets regular exercise.     Family History  Problem Relation Age of Onset  . Stroke Mother 73  . Heart attack Father   . Cancer Neg Hx     No breast or colon cancer  . Heart attack Brother   . Coronary artery disease Other   . Hypertension Other   . Coronary artery disease Father      ROS:  Please see the history of present illness.     All other systems reviewed and negative.    Physical Exam   Blood pressure 156/89, pulse 73, height 5\' 2"  (1.575 m), weight 101 lb (45.813 kg). General: Well developed, well nourished female in no acute distress. Head: Normocephalic, atraumatic, sclera non-icteric, no xanthomas, nares are without discharge. EENT: normal Lymph Nodes:  none Back: without scoliosis/kyphosis, no CVA tendersness Neck: Negative for carotid bruits. JVD not elevated. Lungs: Clear bilaterally to auscultation without wheezes, rales, or rhonchi. Breathing is unlabored. Heart: RRR with S1 S2.  2/6 systolic* murmur , rubs, or gallops appreciated. Abdomen: Soft, non-tender, non-distended with normoactive bowel sounds. No hepatomegaly. No rebound/guarding. No obvious abdominal masses. Msk:  Strength and tone appear normal for age. Extremities: No clubbing or cyanosis. No  edema.  Distal pedal pulses are 2+ and equal bilaterally. Skin: Warm and Dry Neuro: Alert and oriented X 3. CN III-XII intact Grossly normal sensory and motor function . Psych:  Responds to questions appropriately with a normal affect.      Labs: Cardiac Enzymes No results found for this basename: CKTOTAL, CKMB, TROPONINI,  in the last 72 hours CBC Lab Results  Component Value Date   WBC 7.1 06/04/2009   HGB 13.4 06/04/2009   HCT 38.6 06/04/2009   MCV 98.1 06/04/2009   PLT 150.0  06/04/2009   PROTIME: No results found for this basename: LABPROT, INR,  in the last 72 hours Chemistry No results found for this basename: NA, K, CL, CO2, BUN, CREATININE, CALCIUM, LABALBU, PROT, BILITOT, ALKPHOS, ALT, AST, GLUCOSE,  in the last 168 hours Lipids Lab Results  Component Value Date   CHOL 194 06/04/2009   HDL 38.20* 06/04/2009   LDLCALC 113* 11/28/2008   TRIG 218.0* 06/04/2009  BNP Pro B Natriuretic peptide (BNP)  Date/Time Value Ref Range Status  08/30/2009  5:04 PM 157.0* 0.0-100.0 pg/mL Final   Miscellaneous No results found for this basename: DDIMER    Radiology/Studies:  No results found.    ECG: Sinus Rhythm  @73             Intervals  15/10/42  Axis -14 Prior anter MI      Assessment and Plan  Ischemic cardiomyopathy  Congestive heart-chronic-systolic  Implantable defibrillator now at [facility]  Breast cancer-left sided  Peripheral vascular disease  Ongoing tobacco abuse  The patient is referred for consideration of repositioning of her ICD in anticipation of mastectomy. As best as I can tell from the patient, this was not a recommendation made by the oncologist whose comments as related by the patient were unhelpful. She is eager for oncological consultation I can be clarifying as to treatment strategies for her breast mass which has recently been confirmed as hypermetabolic by PET scanning.  In the event that this would trigger mastectomy, we are happy to do whatever needs to be done with the defibrillator to facilitate that. Her ICD is at [facility] and so will need to be changed.  It has been years since her cardiac situation has been evaluated and will undertake echocardiogram and stress Myoview scanning to assess current state and surgical risks.  She is encouraged to stop smoking.  Virl Axe

## 2014-02-17 NOTE — Patient Instructions (Addendum)
[facility]  Your caregiver has ordered a Stress Test with nuclear imaging. The purpose of this test is to evaluate the blood supply to your heart muscle. This procedure is referred to as a "Non-Invasive Stress Test." This is because other than having an IV started in your vein, nothing is inserted or "invades" your body. Cardiac stress tests are done to find areas of poor blood flow to the heart by determining the extent of coronary artery disease (CAD). Some patients exercise on a treadmill, which naturally increases the blood flow to your heart, while others who are  unable to walk on a treadmill due to physical limitations have a pharmacologic/chemical stress agent called Lexiscan . This medicine will mimic walking on a treadmill by temporarily increasing your coronary blood flow.   Please note: these test may take anywhere between 2-4 hours to complete  PLEASE REPORT TO [facility] AT THE FIRST DESK WILL DIRECT YOU WHERE TO GO  Date of Procedure:_____________________________________  Arrival Time for Procedure:______________________________  Instructions regarding medication:    PLEASE NOTIFY THE OFFICE AT LEAST 24 HOURS IN ADVANCE IF YOU ARE UNABLE TO KEEP YOUR APPOINTMENT.  [phone] AND  PLEASE NOTIFY NUCLEAR MEDICINE AT [facility] AT LEAST 24 HOURS IN ADVANCE IF YOU ARE UNABLE TO KEEP YOUR APPOINTMENT. [phone]  How to prepare for your Myoview test:  1. Do not eat or drink after midnight 2. No caffeine for 24 hours prior to test 3. No smoking 24 hours prior to test. 4. Your medication may be taken with water.  If your doctor stopped a medication because of this test, do not take that medication. 5. Ladies, please do not wear dresses.  Skirts or pants are appropriate. Please wear a short sleeve shirt. 6. No perfume, cologne or lotion. 7. Wear comfortable walking shoes. No heels!  Your physician has requested that you have an echocardiogram.  Echocardiography is a painless test that uses sound waves to create images of your heart. It provides your doctor with information about the size and shape of your heart and how well your heart's chambers and valves are working. This procedure takes approximately one hour. There are no restrictions for this procedure.  You have been referred to [facility]

## 2014-02-22 ENCOUNTER — Telehealth: Payer: Self-pay | Admitting: *Deleted

## 2014-02-22 NOTE — Telephone Encounter (Signed)
Informed patient that her Christine [name] has been scheduled for 02/24/14 at 10 am she will need to arrive at 0945 am Scheduled echo with patient on 03/02/14 at 12 pm  Reviewed procedure instructions  Patient verbalized understanding    LVM for Christine [name] at [facility] to schedule new patient appt  Patient aware

## 2014-02-24 ENCOUNTER — Ambulatory Visit: Payer: Self-pay | Admitting: Internal Medicine

## 2014-02-24 DIAGNOSIS — R079 Chest pain, unspecified: Secondary | ICD-10-CM

## 2014-02-27 ENCOUNTER — Other Ambulatory Visit: Payer: Self-pay

## 2014-02-27 DIAGNOSIS — I2589 Other forms of chronic ischemic heart disease: Secondary | ICD-10-CM

## 2014-02-28 ENCOUNTER — Telehealth: Payer: Self-pay | Admitting: *Deleted

## 2014-02-28 NOTE — Telephone Encounter (Signed)
LVM for Tiffany at [facility] for new patient appt

## 2014-03-02 ENCOUNTER — Other Ambulatory Visit (INDEPENDENT_AMBULATORY_CARE_PROVIDER_SITE_OTHER): Payer: Medicare Other

## 2014-03-02 ENCOUNTER — Other Ambulatory Visit: Payer: Self-pay

## 2014-03-02 DIAGNOSIS — I509 Heart failure, unspecified: Secondary | ICD-10-CM

## 2014-03-02 DIAGNOSIS — R0602 Shortness of breath: Secondary | ICD-10-CM

## 2014-03-02 DIAGNOSIS — I2589 Other forms of chronic ischemic heart disease: Secondary | ICD-10-CM

## 2014-03-02 DIAGNOSIS — R011 Cardiac murmur, unspecified: Secondary | ICD-10-CM

## 2014-03-03 ENCOUNTER — Telehealth: Payer: Self-pay | Admitting: *Deleted

## 2014-03-03 NOTE — Telephone Encounter (Signed)
Scheduled patient to be seen per Dr. Fletcher Anon  03/07/14

## 2014-03-06 ENCOUNTER — Telehealth: Payer: Self-pay | Admitting: *Deleted

## 2014-03-06 NOTE — Telephone Encounter (Signed)
Received paperwork from [facility].  Called and confirmed 03/08/14 appt w/ pt.  Unable to mail before letter - gave verbal.  Unable to mail welcoming packet - gave instructions and directions.  Unable to mail intake form - placed a note for one to be given at time of check in.  Made a copy of paperwork and took to HIM to create a chart. Called and left a message for Elmyra Ricks at referring to make them aware.

## 2014-03-07 ENCOUNTER — Ambulatory Visit: Payer: Medicare Other | Admitting: Cardiovascular Disease

## 2014-03-08 ENCOUNTER — Ambulatory Visit: Payer: Medicare Other | Admitting: Hematology and Oncology

## 2014-03-08 ENCOUNTER — Telehealth: Payer: Self-pay | Admitting: *Deleted

## 2014-03-08 ENCOUNTER — Ambulatory Visit: Payer: Medicare Other

## 2014-03-08 NOTE — Telephone Encounter (Signed)
Dr. Lindi Adie informed me that the pt did not show for her appt.  Called pt to see if she wanted to reschedule, but I got no answer, no machine.

## 2014-03-08 NOTE — Telephone Encounter (Signed)
[place] returned my call and stated the patient has been scheduled

## 2014-03-14 ENCOUNTER — Telehealth: Payer: Self-pay | Admitting: Cardiovascular Disease

## 2014-03-14 NOTE — Telephone Encounter (Signed)
Doctor needs to put in a Mastectomy , device on patient may be in the way, please advise them, said they spoke to [place] and she told them they needed to talk to doctors about this. They are just following up on this.

## 2014-03-14 NOTE — Telephone Encounter (Signed)
Per Charlena Cross from Oncology  Patient needs left mastectomy  Her Device is in the way  Please advise

## 2014-03-15 ENCOUNTER — Telehealth: Payer: Self-pay

## 2014-03-15 NOTE — Telephone Encounter (Signed)
No answer/no vm 9/30

## 2014-03-15 NOTE — Telephone Encounter (Signed)
LVM for Dr. Candelaria Stagers RN

## 2014-03-15 NOTE — Telephone Encounter (Signed)
Pt would like tests results from Dr. Caryl Comes. Please call and advise

## 2014-03-22 NOTE — Telephone Encounter (Signed)
Can u have her surgeon or oncolgist call me so we can come up with plan thanks

## 2014-03-22 NOTE — Telephone Encounter (Signed)
I spoke with her oncologists rn  She stated that patient is refusing care and no longer showing up to appointments  I have attempted to call patient several times and so has her oncology nurse  We are not sure how the patient would like to proceed

## 2014-03-23 ENCOUNTER — Telehealth: Payer: Self-pay | Admitting: *Deleted

## 2014-03-23 DIAGNOSIS — I5022 Chronic systolic (congestive) heart failure: Secondary | ICD-10-CM

## 2014-03-23 DIAGNOSIS — Z01812 Encounter for preprocedural laboratory examination: Secondary | ICD-10-CM

## 2014-03-23 NOTE — Telephone Encounter (Signed)
Patient called and stated she would like to come in and discuss her surgical options with Dr. Caryl Comes

## 2014-03-23 NOTE — Telephone Encounter (Signed)
This encounter was created in error - please disregard.

## 2014-03-23 NOTE — Telephone Encounter (Signed)
Results given to patient

## 2014-03-28 ENCOUNTER — Ambulatory Visit (INDEPENDENT_AMBULATORY_CARE_PROVIDER_SITE_OTHER): Payer: Medicare Other | Admitting: Internal Medicine

## 2014-03-28 ENCOUNTER — Encounter: Payer: Self-pay | Admitting: Internal Medicine

## 2014-03-28 VITALS — BP 130/80 | HR 64 | Ht 62.0 in | Wt 100.2 lb

## 2014-03-28 DIAGNOSIS — I5020 Unspecified systolic (congestive) heart failure: Secondary | ICD-10-CM

## 2014-03-28 DIAGNOSIS — Z716 Tobacco abuse counseling: Secondary | ICD-10-CM

## 2014-03-28 DIAGNOSIS — I2589 Other forms of chronic ischemic heart disease: Secondary | ICD-10-CM

## 2014-03-28 DIAGNOSIS — Z01810 Encounter for preprocedural cardiovascular examination: Secondary | ICD-10-CM

## 2014-03-28 DIAGNOSIS — Z95810 Presence of automatic (implantable) cardiac defibrillator: Secondary | ICD-10-CM

## 2014-03-28 DIAGNOSIS — I509 Heart failure, unspecified: Secondary | ICD-10-CM

## 2014-03-28 DIAGNOSIS — I255 Ischemic cardiomyopathy: Secondary | ICD-10-CM

## 2014-03-28 DIAGNOSIS — I10 Essential (primary) hypertension: Secondary | ICD-10-CM

## 2014-03-28 MED ORDER — SPIRONOLACTONE 12.5 MG HALF TABLET: 12.5000 mg | ORAL_TABLET | Freq: Every day | ORAL | Status: DC

## 2014-03-28 NOTE — Patient Instructions (Signed)
Your physician has recommended you make the following change in your medication:  Start Aldactone 12.5 mg (1/2 a tablet) once daily   Your physician recommends that you return for lab work in:  BMP in one week   Your physician recommends that you schedule a follow-up appointment in:  3 weeks with Dr. Caryl Comes   Please call us and let us know the plan after you see your oncologist

## 2014-03-28 NOTE — Progress Notes (Signed)
Patient Care Team: Glendon Axe, MD as PCP - General (Internal Medicine)   HPI  Christine [name] is a 68 y.o. female Seen in followup for consideration of repositioning of her ICD. She has a history of breast cancer in her in a series of questions regarding what is the next step. There've been some consideration of mastectomy and the concern that the device needs to be removed prior to that surgery.  Echocardiogram 2012 demonstrated impaired LV function, i.e. 25-30% with apical thrombus.   Echocardiogram 9/15 demonstrated ejection fraction of 30-35% with a mural thrombus. No stalk was described. Myoview scan demonstrated similar ejection fraction with inferobasilar ischemia and a large infarct. She's having no angina. Dyspnea is stable. Unfortunately she continues to smoke.  Past Medical History  Diagnosis Date  . Allergic rhinitis   . Anxiety   . Coronary artery disease   . GERD (gastroesophageal reflux disease)   . Hyperlipidemia   . Hypertension   . Osteoarthritis   . Chronic systolic heart failure   . Heart murmur   . MI (myocardial infarction)   . Breast cancer   . Implantable defibrillator St Jude     DOI 2006    Past Surgical History  Procedure Laterality Date  . Coronary stent placement  8/06    3 stents; myocardial infarction  . Pacemaker placement  2/07    Defibrillator  . Chf exacerbation  10/07  . Nm myoview ltd  1/08    EF 39%, no sx ischemia    Current Outpatient Prescriptions  Medication Sig Dispense Refill  . Acetaminophen 500 MG coapsule Take by mouth as needed.        Marland Kitchen albuterol (PROAIR HFA) 108 (90 BASE) MCG/ACT inhaler Inhale 2 puffs into the lungs as needed.        Marland Kitchen anastrozole (ARIMIDEX) 1 MG tablet Take 1 mg by mouth daily.       . carvedilol (COREG) 25 MG tablet TAKE 1 TABLET BY MOUTH TWICE A DAY  60 tablet  2  . Cholecalciferol (VITAMIN D) 2000 UNITS tablet Take 2,000 Units by mouth daily.      Marland Kitchen DIOVAN 320 MG tablet TAKE 1 TABLET BY  MOUTH EVERY DAY  30 tablet  0  . furosemide (LASIX) 40 MG tablet Take 40 mg by mouth daily as needed.        Marland Kitchen LORazepam (ATIVAN) 0.5 MG tablet Take 0.5 mg by mouth 2 (two) times daily as needed.        Marland Kitchen omeprazole (PRILOSEC) 20 MG capsule Take 20 mg by mouth 2 (two) times daily.        . rosuvastatin (CRESTOR) 10 MG tablet Take 10 mg by mouth at bedtime.        . sodium chloride (OCEAN) 0.65 % nasal spray Place 2 sprays into the nose every 4 (four) hours as needed. In each nostril.       . traMADol (ULTRAM) 50 MG tablet Take 50 mg by mouth as needed.       No current facility-administered medications for this visit.    Allergies  Allergen Reactions  . Atorvastatin     REACTION: increased LFT's  . Lisinopril     REACTION: cough  . Oxycodone Hcl     REACTION: unspecified  . Simvastatin     REACTION: myalgias    Review of Systems negative except from HPI and PMH  Physical Exam BP 130/80  Pulse 64  Ht  5\' 2"  (1.575 m)  Wt 100 lb 4 oz (45.473 kg)  BMI 18.33 kg/m2 Well developed and well nourished in no acute distress HENT normal E scleral and icterus clear Neck Supple JVP flat; carotids brisk and full Clear to ausculation Device pocket well healed; without hematoma or erythema.  There is no tethering Port-A-Cath on the right side of  Regular rate and rhythm, no murmurs gallops or rub Soft with active bowel sounds No clubbing cyanosis  Edema Alert and oriented, grossly normal motor and sensory function Skin Warm and Dry    Assessment and  Plan  Ischemic cardiomyopathy  Breast cancer  Implantable defibrillator  Tobacco abuse  Hypertension  We will begin her on Aldactone to further try to mitigate risks associated with ischemic cardiomyopathy. We will check her potassium level in one week and in 3 weeks. Last potassium level was 3.6 with a normal creatinine.  In the event that the decision is made to proceed with mastectomy, I recommend that we explant the  defibrillator. We would put caps on the leads with the intention of reusing then following healing from her mastectomy with the use of a new defibrillator as her device had 8 years and that are already used. In the interim we would use a LifeVest for protection against cardiac arrhythmias  Encouraged not to smoke

## 2014-03-29 ENCOUNTER — Other Ambulatory Visit: Payer: Self-pay | Admitting: *Deleted

## 2014-03-30 ENCOUNTER — Other Ambulatory Visit: Payer: Self-pay | Admitting: *Deleted

## 2014-03-30 MED ORDER — SPIRONOLACTONE 12.5 MG HALF TABLET: 12.5000 mg | ORAL_TABLET | Freq: Every day | ORAL | Status: DC

## 2014-03-30 MED ORDER — SPIRONOLACTONE 25 MG PO TABS: 12.5000 mg | ORAL_TABLET | Freq: Every day | ORAL | Status: DC

## 2014-04-03 ENCOUNTER — Ambulatory Visit: Payer: Self-pay | Admitting: Internal Medicine

## 2014-04-03 LAB — CBC CANCER CENTER
BASOS ABS: 0.1 x10 3/mm (ref 0.0–0.1)
Basophil %: 0.9 %
EOS ABS: 0.2 x10 3/mm (ref 0.0–0.7)
EOS PCT: 3.2 %
HCT: 36.9 % (ref 35.0–47.0)
HGB: 11.8 g/dL — AB (ref 12.0–16.0)
LYMPHS ABS: 1.8 x10 3/mm (ref 1.0–3.6)
Lymphocyte %: 31.1 %
MCH: 29.1 pg (ref 26.0–34.0)
MCHC: 31.9 g/dL — AB (ref 32.0–36.0)
MCV: 91 fL (ref 80–100)
MONOS PCT: 6.1 %
Monocyte #: 0.4 x10 3/mm (ref 0.2–0.9)
NEUTROS ABS: 3.4 x10 3/mm (ref 1.4–6.5)
Neutrophil %: 58.7 %
Platelet: 137 x10 3/mm — ABNORMAL LOW (ref 150–440)
RBC: 4.04 10*6/uL (ref 3.80–5.20)
RDW: 16 % — AB (ref 11.5–14.5)
WBC: 5.9 x10 3/mm (ref 3.6–11.0)

## 2014-04-03 LAB — HEPATIC FUNCTION PANEL A (ARMC)
ALT: 20 U/L
AST: 22 U/L (ref 15–37)
Albumin: 3.4 g/dL (ref 3.4–5.0)
Alkaline Phosphatase: 112 U/L
BILIRUBIN TOTAL: 0.3 mg/dL (ref 0.2–1.0)
Bilirubin, Direct: 0.1 mg/dL (ref 0.0–0.2)
TOTAL PROTEIN: 6.4 g/dL (ref 6.4–8.2)

## 2014-04-03 LAB — CREATININE, SERUM
CREATININE: 0.94 mg/dL (ref 0.60–1.30)
EGFR (African American): >60
EGFR (Non-African Amer.): >60

## 2014-04-04 ENCOUNTER — Other Ambulatory Visit: Payer: Medicare Other

## 2014-04-05 DIAGNOSIS — J449 Chronic obstructive pulmonary disease, unspecified: Secondary | ICD-10-CM | POA: Insufficient documentation

## 2014-04-05 DIAGNOSIS — Z720 Tobacco use: Secondary | ICD-10-CM | POA: Insufficient documentation

## 2014-04-12 NOTE — Telephone Encounter (Signed)
Spoke with Jocelyn Lamer from Dr. Darliss Ridgel office  Informed her per Dr. Caryl Comes and Dr. Fletcher Anon patient is clear for her surgery  Asked Jocelyn Lamer to call me back with surgery date so I can schedule pacemaker removal and set up lifevest

## 2014-04-16 ENCOUNTER — Ambulatory Visit: Payer: Self-pay | Admitting: Internal Medicine

## 2014-04-17 ENCOUNTER — Telehealth: Payer: Self-pay

## 2014-04-17 NOTE — Telephone Encounter (Signed)
Dr. Tamala Julian is ready to do surgery  He needs pacer removed  Please let me know when we can remove pacer

## 2014-04-17 NOTE — Telephone Encounter (Signed)
Pt states she has been having phone issues, states she thinks Christine [name] has been trying to call her.

## 2014-04-17 NOTE — Telephone Encounter (Signed)
Dr. Tamala Julian needs a port placed and needs pacer/defib moved by Dr. Caryl Comes . Pleae call so this process can be discussed

## 2014-04-17 NOTE — Telephone Encounter (Signed)
Informed patient that I will be contacting her soon to set up her pacer removal

## 2014-04-19 NOTE — Telephone Encounter (Signed)
Patient is ready for surgery  Pacer needs too be removed as soon as possible

## 2014-04-21 NOTE — Telephone Encounter (Signed)
Informed patient her ICD removal has been scheduled for 05/05/14 at 1230 Arrive at 1030 am at the [facility]  Pre procedure labs scheduled  Patient verbalized understanding   Life vest set up with Claiborne Billings in [place] office   LVM for Jocelyn Lamer at Dr. Darliss Ridgel office to inform her of ICD removal date and time

## 2014-04-25 ENCOUNTER — Telehealth: Payer: Self-pay

## 2014-04-25 NOTE — Telephone Encounter (Signed)
Pt states Dr. Caryl Comes needs to fill out another form for medical review for her driver's license, and would like to talk with someone about this form. Please call.

## 2014-04-26 NOTE — Telephone Encounter (Signed)
Pt is calling back to see if she can have the form filled out soon. She states that any doctor can fill out the forms, told patient it has to be her cardiologist, so she stated she understood. She will try and bring the forms by today. Please call.

## 2014-04-26 NOTE — Telephone Encounter (Signed)
Instructed patient to bring paperwork to office for completion  Patient verbalized understanding

## 2014-05-01 ENCOUNTER — Encounter (HOSPITAL_COMMUNITY): Payer: Self-pay | Admitting: Pharmacy Technician

## 2014-05-01 ENCOUNTER — Other Ambulatory Visit: Payer: Medicare Other

## 2014-05-03 ENCOUNTER — Other Ambulatory Visit: Payer: Medicare Other

## 2014-05-03 DIAGNOSIS — Z01812 Encounter for preprocedural laboratory examination: Secondary | ICD-10-CM

## 2014-05-03 DIAGNOSIS — I5022 Chronic systolic (congestive) heart failure: Secondary | ICD-10-CM

## 2014-05-04 ENCOUNTER — Telehealth: Payer: Self-pay | Admitting: *Deleted

## 2014-05-04 LAB — CBC WITH DIFFERENTIAL
BASOS ABS: 0 10*3/uL (ref 0.0–0.2)
Basos: 1 %
Eos: 3 %
Eosinophils Absolute: 0.2 10*3/uL (ref 0.0–0.4)
HCT: 38 % (ref 34.0–46.6)
Hemoglobin: 12.3 g/dL (ref 11.1–15.9)
Immature Grans (Abs): 0 10*3/uL (ref 0.0–0.1)
Immature Granulocytes: 0 %
LYMPHS ABS: 2 10*3/uL (ref 0.7–3.1)
Lymphs: 32 %
MCH: 28.5 pg (ref 26.6–33.0)
MCHC: 32.4 g/dL (ref 31.5–35.7)
MCV: 88 fL (ref 79–97)
MONOS ABS: 0.4 10*3/uL (ref 0.1–0.9)
Monocytes: 6 %
Neutrophils Absolute: 3.7 10*3/uL (ref 1.4–7.0)
Neutrophils Relative %: 58 %
Platelets: 142 10*3/uL — ABNORMAL LOW (ref 150–379)
RBC: 4.32 x10E6/uL (ref 3.77–5.28)
RDW: 15.7 % — AB (ref 12.3–15.4)
WBC: 6.2 10*3/uL (ref 3.4–10.8)

## 2014-05-04 LAB — BASIC METABOLIC PANEL
BUN / CREAT RATIO: 6 — AB (ref 11–26)
BUN: 6 mg/dL — ABNORMAL LOW (ref 8–27)
CHLORIDE: 101 mmol/L (ref 97–108)
CO2: 27 mmol/L (ref 18–29)
Calcium: 9.4 mg/dL (ref 8.7–10.3)
Creatinine, Ser: 0.94 mg/dL (ref 0.57–1.00)
GFR calc non Af Amer: 63 mL/min/{1.73_m2} (ref >59)
GFR, EST AFRICAN AMERICAN: 73 mL/min/{1.73_m2} (ref >59)
Glucose: 83 mg/dL (ref 65–99)
Potassium: 5 mmol/L (ref 3.5–5.2)
Sodium: 142 mmol/L (ref 134–144)

## 2014-05-04 LAB — PROTIME-INR
INR: 1 (ref 0.8–1.2)
Prothrombin Time: 10.9 s (ref 9.1–12.0)

## 2014-05-04 NOTE — Telephone Encounter (Signed)
Patient called and stated she is cancelling ICD removal  She does not wish to proceed with any procedures until she can find some help at home+ Discussed the importance of these procedures with patient  Patient still wishes to cancel  Informed Dr. Caryl Comes, Dr. Darliss Ridgel nurse Jocelyn Lamer) and [facility] Scheduling

## 2014-05-04 NOTE — Telephone Encounter (Signed)
Called patient to discuss time to meet to fit her for Lifevest.  She has decided she is not going to proceed with explant of her ICD to have mastectomy at this time.  She will call back if she decides to move forward with surgery.

## 2014-05-05 ENCOUNTER — Encounter (HOSPITAL_COMMUNITY): Admission: RE | Payer: Self-pay | Source: Ambulatory Visit

## 2014-05-05 ENCOUNTER — Ambulatory Visit (HOSPITAL_COMMUNITY): Admission: RE | Admit: 2014-05-05 | Payer: Medicare Other | Source: Ambulatory Visit | Admitting: Internal Medicine

## 2014-05-05 ENCOUNTER — Telehealth: Payer: Self-pay

## 2014-05-05 SURGERY — ICD GENERATOR CHANGE
Anesthesia: LOCAL

## 2014-05-05 NOTE — Telephone Encounter (Signed)
Attempted to call patient to tell her that her paper work is upfront ready for pick up

## 2014-05-05 NOTE — Telephone Encounter (Signed)
Pt states the time is running out on her DMV paper, would like to know if they are ready to be picked up. Please call and advise

## 2014-05-09 ENCOUNTER — Ambulatory Visit (INDEPENDENT_AMBULATORY_CARE_PROVIDER_SITE_OTHER): Payer: Medicare Other | Admitting: Internal Medicine

## 2014-05-09 ENCOUNTER — Encounter: Payer: Self-pay | Admitting: Internal Medicine

## 2014-05-09 VITALS — BP 165/93 | HR 66 | Ht 62.0 in | Wt 101.0 lb

## 2014-05-09 DIAGNOSIS — I2589 Other forms of chronic ischemic heart disease: Secondary | ICD-10-CM

## 2014-05-09 DIAGNOSIS — I10 Essential (primary) hypertension: Secondary | ICD-10-CM

## 2014-05-09 DIAGNOSIS — I499 Cardiac arrhythmia, unspecified: Secondary | ICD-10-CM

## 2014-05-09 MED ORDER — SPIRONOLACTONE 25 MG PO TABS: 12.5000 mg | ORAL_TABLET | Freq: Every day | ORAL | Status: DC

## 2014-05-09 NOTE — Progress Notes (Signed)
Patient Care Team: Glendon Axe, MD as PCP - General (Internal Medicine)   HPI  Christine [name] is a 68 y.o. female Seen in followup for consideration of repositioning of her ICD. She has a history of breast cancer in her in a series of questions regarding what is the next step. There've been some consideration of mastectomy and the concern that the device needs to be removed prior to that surgery.  She decided to undergo mastectomy. She didn't cancel that. She remains ambivalent.  Echocardiogram 2012 demonstrated impaired LV function, i.e. 25-30% with apical thrombus.   Echocardiogram 9/15 demonstrated ejection fraction of 30-35% with a mural thrombus. No stalk was described. Myoview scan demonstrated similar ejection fraction with inferobasilar ischemia and a large infarct. She's having no angina. Dyspnea is stable. Unfortunately she continues to smoke.    Past Medical History  Diagnosis Date  . Allergic rhinitis   . Anxiety   . Coronary artery disease   . GERD (gastroesophageal reflux disease)   . Hyperlipidemia   . Hypertension   . Osteoarthritis   . Chronic systolic heart failure   . Heart murmur   . MI (myocardial infarction)   . Breast cancer   . Implantable defibrillator St Jude     DOI 2006    Past Surgical History  Procedure Laterality Date  . Coronary stent placement  8/06    3 stents; myocardial infarction  . Pacemaker placement  2/07    Defibrillator  . Chf exacerbation  10/07  . Nm myoview ltd  1/08    EF 39%, no sx ischemia    Current Outpatient Prescriptions  Medication Sig Dispense Refill  . Acetaminophen 500 MG coapsule Take 500-1,000 mg by mouth every 4 (four) hours as needed for pain.     Marland Kitchen albuterol (PROAIR HFA) 108 (90 BASE) MCG/ACT inhaler Inhale 2 puffs into the lungs every 4 (four) hours as needed for wheezing or shortness of breath.     . anastrozole (ARIMIDEX) 1 MG tablet Take 1 mg by mouth daily.     . bisacodyl (DULCOLAX) 5 MG EC  tablet Take 5 mg by mouth daily as needed for mild constipation or moderate constipation.    . carvedilol (COREG) 25 MG tablet TAKE 1 TABLET BY MOUTH TWICE A DAY 60 tablet 2  . Cholecalciferol (VITAMIN D) 2000 UNITS tablet Take 2,000 Units by mouth daily.    Marland Kitchen DIOVAN 320 MG tablet TAKE 1 TABLET BY MOUTH EVERY DAY 30 tablet 0  . furosemide (LASIX) 40 MG tablet Take 40 mg by mouth daily as needed for fluid.     Marland Kitchen loratadine (CLARITIN) 10 MG tablet Take 10 mg by mouth daily as needed for allergies.    Marland Kitchen LORazepam (ATIVAN) 0.5 MG tablet Take 0.5 mg by mouth 2 (two) times daily as needed for anxiety.     Marland Kitchen LORazepam (ATIVAN) 0.5 MG tablet Take 1/2 to one tablet by mouth twice a day as needed    . omeprazole (PRILOSEC) 20 MG capsule Take 20 mg by mouth 2 (two) times daily.      . rosuvastatin (CRESTOR) 10 MG tablet Take 10 mg by mouth at bedtime.      . sodium chloride (OCEAN) 0.65 % nasal spray Place 2 sprays into the nose every 4 (four) hours as needed. In each nostril.     . traMADol (ULTRAM) 50 MG tablet Take 50 mg by mouth every 6 (six) hours as needed for  moderate pain.      No current facility-administered medications for this visit.    Allergies  Allergen Reactions  . Atorvastatin     REACTION: increased LFT's  . Lisinopril     REACTION: cough  . Oxycodone Hcl     REACTION: unspecified  . Simvastatin     REACTION: myalgias    Review of Systems negative except from HPI and PMH  Physical Exam BP 165/93 mmHg  Pulse 66  Ht 5\' 2"  (1.575 m)  Wt 45.813 kg (101 lb)  BMI 18.47 kg/m2 Well developed and well nourished in no acute distress HENT normal E scleral and icterus clear Neck Supple JVP flat; carotids brisk and full Clear to ausculation Device pocket well healed; without hematoma or erythema.  There is no tethering Port-A-Cath on the right side of  Regular rate and rhythm, no murmurs gallops or rub Soft with active bowel sounds No clubbing cyanosis  Edema Alert and  oriented, grossly normal motor and sensory function Skin Warm and Dry    Assessment and  Plan  Ischemic cardiomyopathy  Breast cancer  Implantable defibrillator  Tobacco abuse  Hypertension  We will begin her on Aldactone to further try to mitigate risks associated with ischemic cardiomyopathy. We will check her potassium level in one week and in 3 weeks. Last potassium level was 3.6 with a normal creatinine.  Encouraged not to smoke  We had a lengthy discussion regarding the issue of her breast cancer and her expected mastectomy. It has been a couple years since a biopsy was done. She  Recounts that she was told at [facility] at the information was nondiagnostic. She recalls also that she had an abnormal mammogram 20 years ago. At this point she is not able to make a decision about proceeding with a mastectomy. I've encouraged her to get a second opinion. She will avail herself of this   We spent more than 50% of our >25 min visit in face to face counseling regarding the above

## 2014-05-09 NOTE — Patient Instructions (Signed)
Your physician has recommended you make the following change in your medication:  Start Aldactone 12.5 mg once daily (1/2 tablet)   Your physician recommends that you return for lab work in: \ BMP in 1 week  BMP in 3 weeks   Your physician recommends that you schedule a follow-up appointment in:  3 months with Dr. Caryl Comes    Will contact you about oncology appointment

## 2014-05-15 ENCOUNTER — Other Ambulatory Visit: Payer: Medicare Other

## 2014-05-25 ENCOUNTER — Telehealth: Payer: Self-pay | Admitting: *Deleted

## 2014-05-25 NOTE — Telephone Encounter (Signed)
Patient scheduled with [facility] 05/30/14 @ 3:30 pm with Dr. Lindi Adie  Patient verbalized understanding  Patient instructed to call Lattie Haw at [phone] with any questions

## 2014-05-25 NOTE — Telephone Encounter (Signed)
Received a call from Christine [name] at [facility] in [place] stating the pt is requesting to come here for a second opinion before surgery.  Confirmed 05/30/14 appt w/ Christine [name] to give to pt.  Unable to mail before letter - gave verbal.  Unable to mail welcoming packet - gave directions and instructions.  Unable to mail intake form - placed a note for one to be given at time of check in.  Gave my number to Christine [name] to give to pt if she needed me to call.  Added to spreadsheet.

## 2014-05-30 ENCOUNTER — Ambulatory Visit: Payer: Medicare Other | Admitting: Hematology and Oncology

## 2014-05-30 ENCOUNTER — Other Ambulatory Visit: Payer: Medicare Other

## 2014-05-30 ENCOUNTER — Ambulatory Visit: Payer: Medicare Other

## 2014-08-15 ENCOUNTER — Encounter: Payer: Medicare Other | Admitting: Internal Medicine

## 2014-08-15 ENCOUNTER — Encounter: Payer: Self-pay | Admitting: *Deleted

## 2014-08-15 NOTE — Progress Notes (Signed)
Patient Care Team: Glendon Axe, MD as PCP - General (Internal Medicine)   HPI  Christine [name] is a 69 y.o. female Seen in followup for consideration of repositioning of her ICD. She has a history of breast cancer in her in a series of questions regarding what is the next step. There've been some consideration of mastectomy and the concern that the device needs to be removed prior to that surgery.   She decided to undergo mastectomy. She didn't cancel that. She remains ambivalent. At our last visit in November, I encouraged her to get a second opinion given the ambivalence and she had felt and the recommendations that had been not clear at least in her mind.  Echocardiogram 2012 demonstrated impaired LV function, i.e. 25-30% with apical thrombus.   Echocardiogram 9/15 demonstrated ejection fraction of 30-35% with a mural thrombus. No stalk was described. Myoview scan demonstrated similar ejection fraction with inferobasilar ischemia and a large infarct. She's having no angina. Dyspnea is stable. Unfortunately she continues to smoke.    Past Medical History  Diagnosis Date  . Allergic rhinitis   . Anxiety   . Coronary artery disease   . GERD (gastroesophageal reflux disease)   . Hyperlipidemia   . Hypertension   . Osteoarthritis   . Chronic systolic heart failure   . Heart murmur   . MI (myocardial infarction)   . Breast cancer   . Implantable defibrillator St Jude     DOI 2006    Past Surgical History  Procedure Laterality Date  . Coronary stent placement  8/06    3 stents; myocardial infarction  . Pacemaker placement  2/07    Defibrillator  . Chf exacerbation  10/07  . Nm myoview ltd  1/08    EF 39%, no sx ischemia    Current Outpatient Prescriptions  Medication Sig Dispense Refill  . Acetaminophen 500 MG coapsule Take 500-1,000 mg by mouth every 4 (four) hours as needed for pain.     Marland Kitchen albuterol (PROAIR HFA) 108 (90 BASE) MCG/ACT inhaler Inhale 2 puffs  into the lungs every 4 (four) hours as needed for wheezing or shortness of breath.     . anastrozole (ARIMIDEX) 1 MG tablet Take 1 mg by mouth daily.     . bisacodyl (DULCOLAX) 5 MG EC tablet Take 5 mg by mouth daily as needed for mild constipation or moderate constipation.    . carvedilol (COREG) 25 MG tablet TAKE 1 TABLET BY MOUTH TWICE A DAY 60 tablet 2  . Cholecalciferol (VITAMIN D) 2000 UNITS tablet Take 2,000 Units by mouth daily.    Marland Kitchen DIOVAN 320 MG tablet TAKE 1 TABLET BY MOUTH EVERY DAY 30 tablet 0  . furosemide (LASIX) 40 MG tablet Take 40 mg by mouth daily as needed for fluid.     Marland Kitchen loratadine (CLARITIN) 10 MG tablet Take 10 mg by mouth daily as needed for allergies.    Marland Kitchen LORazepam (ATIVAN) 0.5 MG tablet Take 0.5 mg by mouth 2 (two) times daily as needed for anxiety.     Marland Kitchen LORazepam (ATIVAN) 0.5 MG tablet Take 1/2 to one tablet by mouth twice a day as needed    . omeprazole (PRILOSEC) 20 MG capsule Take 20 mg by mouth 2 (two) times daily.      . rosuvastatin (CRESTOR) 10 MG tablet Take 10 mg by mouth at bedtime.      . sodium chloride (OCEAN) 0.65 % nasal spray Place  2 sprays into the nose every 4 (four) hours as needed. In each nostril.     Marland Kitchen spironolactone (ALDACTONE) 25 MG tablet Take 0.5 tablets (12.5 mg total) by mouth daily. 30 tablet 6  . traMADol (ULTRAM) 50 MG tablet Take 50 mg by mouth every 6 (six) hours as needed for moderate pain.      No current facility-administered medications for this visit.    Allergies  Allergen Reactions  . Atorvastatin     REACTION: increased LFT's  . Lisinopril     REACTION: cough  . Oxycodone Hcl     REACTION: unspecified  . Simvastatin     REACTION: myalgias    Review of Systems negative except from HPI and PMH  Physical Exam There were no vitals taken for this visit. Well developed and well nourished in no acute distress HENT normal E scleral and icterus clear Neck Supple JVP flat; carotids brisk and full Clear to  ausculation Device pocket well healed; without hematoma or erythema.  There is no tethering Port-A-Cath on the right side of  Regular rate and rhythm, no murmurs gallops or rub Soft with active bowel sounds No clubbing cyanosis  Edema Alert and oriented, grossly normal motor and sensory function Skin Warm and Dry    Assessment and  Plan  Ischemic cardiomyopathy  Breast cancer  Implantable defibrillator  Tobacco abuse  Hypertension  We will begin her on Aldactone to further try to mitigate risks associated with ischemic cardiomyopathy. We will check her potassium level in one week and in 3 weeks. Last potassium level was 3.6 with a normal creatinine.  Encouraged not to smoke  We had a lengthy discussion regarding the issue of her breast cancer and her expected mastectomy. It has been a couple years since a biopsy was done. She  Recounts that she was told at [facility] at the information was nondiagnostic. She recalls also that she had an abnormal mammogram 20 years ago. At this point she is not able to make a decision about proceeding with a mastectomy. I've encouraged her to get a second opinion. She will avail herself of this   We spent more than 50% of our >25 min visit in face to face counseling regarding the above

## 2014-08-16 ENCOUNTER — Ambulatory Visit (INDEPENDENT_AMBULATORY_CARE_PROVIDER_SITE_OTHER): Payer: Medicare Other | Admitting: Internal Medicine

## 2014-08-16 ENCOUNTER — Telehealth: Payer: Self-pay | Admitting: *Deleted

## 2014-08-16 ENCOUNTER — Encounter: Payer: Self-pay | Admitting: Internal Medicine

## 2014-08-16 VITALS — BP 130/82 | HR 63 | Ht 62.0 in | Wt 98.5 lb

## 2014-08-16 DIAGNOSIS — I255 Ischemic cardiomyopathy: Secondary | ICD-10-CM

## 2014-08-16 DIAGNOSIS — I5022 Chronic systolic (congestive) heart failure: Secondary | ICD-10-CM

## 2014-08-16 DIAGNOSIS — I10 Essential (primary) hypertension: Secondary | ICD-10-CM

## 2014-08-16 LAB — MDC_IDC_ENUM_SESS_TYPE_INCLINIC
Battery Voltage: 2.2 V — CL
Lead Channel Setting Pacing Amplitude: 2 V
Lead Channel Setting Pacing Amplitude: 2.5 V
MDC IDC PG SERIAL: 333576
MDC IDC SESS DTM: 20160302141905
MDC IDC SET LEADCHNL RV PACING PULSEWIDTH: 0.5 ms
MDC IDC SET LEADCHNL RV SENSING SENSITIVITY: 0.3 mV
MDC IDC STAT BRADY RA PERCENT PACED: 25 %
MDC IDC STAT BRADY RV PERCENT PACED: 1 %
Zone Setting Detection Interval: 250 ms
Zone Setting Detection Interval: 300 ms
Zone Setting Detection Interval: 375 ms

## 2014-08-16 NOTE — Telephone Encounter (Signed)
Pt is calling stating that she feels like she is on Curasol Takes medication and feels dizzy Been going on for a week Called EMS on Saturday they said everything was fine, but if we can see her sooner.  Even when she eats, she feels "drunk" if we can't see her today she will go to [facility] to another PCP.  Spoke to nancy, we will try and see if we can fit her in today with dr.klein.

## 2014-08-16 NOTE — Patient Instructions (Signed)
Your physician wants you to follow-up in: 6 months with Dr. Klein. You will receive a reminder letter in the mail two months in advance. If you don't receive a letter, please call our office to schedule the follow-up appointment.  Your physician recommends that you continue on your current medications as directed. Please refer to the Current Medication list given to you today.  

## 2014-08-16 NOTE — Progress Notes (Signed)
Patient Care Team: Glendon Axe, MD as PCP - General (Internal Medicine)   HPI  Christine [name] is a 69 y.o. female Seen in followup for consideration of repositioning of her ICD. She has a history of breast cancer in her in a series of questions regarding what is the next step. There've been some consideration of mastectomy and the concern that the device needs to be removed prior to that surgery.  She has not decided what to do about mastectomy.  She comes in today with multiple complaints related to pain in her neck which is fleeting and incapacitating lasting thankfully only a few seconds, pain in her right side when she lies down which is now better, intermittent tremulousness of her hands.  Echocardiogram 2012 demonstrated impaired LV function, i.e. 25-30% with apical thrombus.   Echocardiogram 9/15 demonstrated ejection fraction of 30-35% with a mural thrombus. No stalk was described. Myoview scan demonstrated similar ejection fraction with inferobasilar ischemia and a large infarct. She's having no angina. Dyspnea is stable. Unfortunately she continues to smoke.  Her device is at end of service and we need to make a decision regarding the mastectomy so as to know what  to do about her device    Past Medical History  Diagnosis Date  . Allergic rhinitis   . Anxiety   . Coronary artery disease   . GERD (gastroesophageal reflux disease)   . Hyperlipidemia   . Hypertension   . Osteoarthritis   . Chronic systolic heart failure   . Heart murmur   . MI (myocardial infarction)   . Breast cancer   . Implantable defibrillator St Jude     DOI 2006    Past Surgical History  Procedure Laterality Date  . Coronary stent placement  8/06    3 stents; myocardial infarction  . Pacemaker placement  2/07    Defibrillator  . Chf exacerbation  10/07  . Nm myoview ltd  1/08    EF 39%, no sx ischemia    Current Outpatient Prescriptions  Medication Sig Dispense Refill  .  Acetaminophen 500 MG coapsule Take 500-1,000 mg by mouth every 4 (four) hours as needed for pain.     Marland Kitchen albuterol (PROAIR HFA) 108 (90 BASE) MCG/ACT inhaler Inhale 2 puffs into the lungs every 4 (four) hours as needed for wheezing or shortness of breath.     . anastrozole (ARIMIDEX) 1 MG tablet Take 1 mg by mouth daily.     . bisacodyl (DULCOLAX) 5 MG EC tablet Take 5 mg by mouth daily as needed for mild constipation or moderate constipation.    . carvedilol (COREG) 25 MG tablet TAKE 1 TABLET BY MOUTH TWICE A DAY 60 tablet 2  . Cholecalciferol (VITAMIN D) 2000 UNITS tablet Take 2,000 Units by mouth daily.    Marland Kitchen DIOVAN 320 MG tablet TAKE 1 TABLET BY MOUTH EVERY DAY 30 tablet 0  . furosemide (LASIX) 40 MG tablet Take 40 mg by mouth daily as needed for fluid.     Marland Kitchen loratadine (CLARITIN) 10 MG tablet Take 10 mg by mouth daily as needed for allergies.    Marland Kitchen LORazepam (ATIVAN) 0.5 MG tablet Take 0.5 mg by mouth 2 (two) times daily as needed for anxiety.     Marland Kitchen omeprazole (PRILOSEC) 20 MG capsule Take 20 mg by mouth 2 (two) times daily.      . rosuvastatin (CRESTOR) 10 MG tablet Take 10 mg by mouth at bedtime.      Marland Kitchen  sodium chloride (OCEAN) 0.65 % nasal spray Place 2 sprays into the nose every 4 (four) hours as needed. In each nostril.     Marland Kitchen spironolactone (ALDACTONE) 25 MG tablet Take 0.5 tablets (12.5 mg total) by mouth daily. 30 tablet 6  . traMADol (ULTRAM) 50 MG tablet Take 50 mg by mouth every 6 (six) hours as needed for moderate pain.      No current facility-administered medications for this visit.    Allergies  Allergen Reactions  . Atorvastatin     REACTION: increased LFT's  . Lisinopril     REACTION: cough  . Oxycodone Hcl     REACTION: unspecified  . Simvastatin     REACTION: myalgias    Review of Systems negative except from HPI and PMH  Physical Exam BP 130/82 mmHg  Pulse 63  Ht 5\' 2"  (1.575 m)  Wt 98 lb 8 oz (44.679 kg)  BMI 18.01 kg/m2 Well developed and well nourished in  no acute distress HENT normal E scleral and icterus clear Neck Supple JVP flat; carotids brisk and full Clear to ausculation Device pocket well healed; without hematoma or erythema.  There is no tethering Port-A-Cath on the right side of  Regular rate and rhythm, no murmurs gallops or rub Soft with active bowel sounds No clubbing cyanosis  Edema Alert and oriented, grossly normal motor and sensory function Skin Warm and Dry    Assessment and  Plan  Ischemic cardiomyopathy  Breast cancer  Implantable defibrillator at EOS   Tobacco abuse  Hypertension    We had a lengthy discussion regarding multiple complaints including a discomfort tha is lancinating in her neck, pain on her side, tremulousness in her hands is suggested that she follow up with her primary care physician regarding these concerns.  I also reiterated to her my concern regarding the decision-making related to her mastectomy and again encouraged her to seek out the opinion of an oncologist with breast cancer expertise to help her make a decision. She said she would do this.  We spent more than 50% of our >25 min visit in face to face counseling regarding the above

## 2014-08-16 NOTE — Telephone Encounter (Signed)
Pt was able to be fit in today, she is coming at 1:45 to see dr Caryl Comes

## 2014-10-08 NOTE — Op Note (Signed)
PATIENT NAME:  Christine [name], Christine [name] MR#:  [id number] DATE OF BIRTH:  [birth year]  DATE OF PROCEDURE:  06/20/2011  PREOPERATIVE DIAGNOSIS: Carcinoma of the left breast.   POSTOPERATIVE DIAGNOSIS: Carcinoma of the left breast.   PROCEDURE PERFORMED: Insertion of central venous catheter with subcutaneous infusion port.   SURGEON: Rochel Brome, MD  ANESTHESIA: Local 1% Xylocaine with monitored anesthesia care.   INDICATION: This 69 year old female has a history of findings of cancer of the left breast now needing central venous access for chemotherapy.   DESCRIPTION OF PROCEDURE: The patient was placed on the operating table in the supine position and sedated. A rolled sheet was placed behind the shoulder blades. The head was placed on a donut ring. The neck was extended and the head turned 20 degrees to the left. The neck and right subclavian areas were prepared with ChloraPrep and draped in a sterile manner.   The skin beneath the clavicle was infiltrated with 1% Xylocaine. A transversely oriented 3 cm incision was made and carried down through subcutaneous tissues. A subcutaneous pouch was created just anterior to the deep fascia large enough to admit the PFM port. Next, with the patient in the Trendelenburg position, the right jugular vein was identified with ultrasound. The vein appeared to be of normal caliber. The carotid artery was noted as well. The skin overlying the jugular vein was infiltrated with Xylocaine. A transversely oriented 6 mm incision was made and carried down through subcutaneous tissues. Next, using ultrasound guidance, a needle was inserted into the jugular vein. A guidewire was inserted and an ultrasonic image was saved for the paper chart. Next, fluoroscopy was done to demonstrate the position of the guidewire, in the vena cava. The needle was withdrawn and the dilator and introducer sheath were advanced over the guidewire. The dilator and guidewire were removed. The catheter was  advanced down through the introducer sheath and the sheath was peeled away. Fluoroscopy was used to position the tip of the catheter in the superior vena cava and also recognized her pacemaker wires. An image was saved for the paper chart. Next, the catheter was tunneled down to the subclavian port and pressure held over the tunnel site. The incision was examined. It is noted that during the course of the procedure several small bleeding points were cauterized. Hemostasis was subsequently intact. The catheter was cut to fit and attached to the port with the accompanying sleeve. The port was accessed with the [facility] needle, aspirated a trace of blood and then flushed with 10 mL of saline. The port was placed into the subcutaneous pouch and sutured to the deep fascia with 4-0 silk. The pouch was closed with 5-0 Vicryl. Both skin incisions were closed with 5-0 Vicryl subcuticular suture and Dermabond.   The patient tolerated surgery satisfactorily and was then prepared for transfer to the Recovery Room. ____________________________ Lenna Sciara. Rochel Brome, MD jws:slb D: 06/20/2011 11:46:36 ET T: 06/20/2011 12:57:42 ET JOB#: [id number]  cc: Loreli Dollar, MD, <Dictator> Loreli Dollar MD ELECTRONICALLY SIGNED 07/16/2011 18:13

## 2014-10-08 NOTE — Discharge Summary (Signed)
PATIENT NAME:  Christine, [name] MR#:  [id number] DATE OF BIRTH:  1946/04/18  DATE OF ADMISSION:  08/30/2011 DATE OF DISCHARGE:  08/31/2011  PRIMARY CARE PHYSICIAN: Timmothy Sours C. Mable Fill, MD    DISCHARGE DIAGNOSES:  1. Left foot cellulitis.  2. Peripheral vascular disease, status post left leg angioplasty one week ago.  3. Anemia. 4. Left breast cancer, receiving chemotherapy.  5. History of coronary artery disease.  6. History of cardiomyopathy.  7. History of hypertension.  8. Gastroesophageal reflux disease.  9. History of depression/anxiety disorder.  10. History of hyperlipidemia.   DISCHARGE MEDICATIONS:  1. Dicloxacillin 500 mg every 6 hours for 9 days.  2. Promethazine 25 mg every 6 hours p.r.n. nausea.  3. Crestor 10 mg daily.  4. Omeprazole 20 mg b.i.d.  5. Coreg 25 mg b.i.d.  6. Lorazepam 0.5 mg daily as needed.  7. Tylenol extra strength 500 mg, 2 tabs every 6 hours as needed.  8. Fish oil orally as directed once daily.  9. Vitamin D 2000 units orally as directed once daily.  10. Furosemide 40 mg daily.  11. Diovan 320 mg daily.  12. Plavix 75 mg daily.  13. Acetaminophen/hydrocodone 325/7.5 mg every 6 hours as needed.  14. Sertraline 50 mg daily.  15. Aspirin 81 mg daily.  16. Pro air HFA 2 puffs every 4 hours p.r.n.  17. Tramadol 50 mg, 1 tab every 6 hours p.r.n.   HISTORY OF PRESENT ILLNESS: The patient is a 69 year old female with peripheral vascular disease who underwent angioplasty of the left lower extremity one week ago, presenting with new onset left foot swelling, pain, and erythema. She has had long-standing pain in the toes of the left foot; however, in the last three days she had noticed the foot developed swelling, and in the last one day she had weeping at the site of the swelling. She also had fevers, chills, and increased lethargy.  HOSPITAL COURSE: She was found to have a swollen, red, and very tender left foot. She was not toxic and she was afebrile. Her exam  was otherwise normal. Her white blood cell count was normal. She was unable to bear weight and therefore was admitted for just observation. She was given IV Ancef 1 gram every 8 hours. She had an uneventful evening in the hospital, and when Physical Therapy evaluated her the following day she was able to weight bear. White blood cell count remained normal. She did have anemia with a hemoglobin and hematocrit of 8.7 and 26.3 on admission. These were repeated 2 times within 24 hours. Hemoglobin 4 hours after the initial tests had increased to 9.6 and then 12 hours after the initial test had decreased to 8.0. Her lab testing was otherwise unremarkable. She was discharged to home with a 9-day course of dicloxacillin.    DISCHARGE INSTRUCTIONS:  1. The patient is to arrange for outpatient followup with Dr. Mable Fill after a course of antibiotics is completed, in about two weeks.  2. The patient is to arrange for outpatient followup with Hematology/Oncology for ongoing chemotherapy.  3. Follow up with Dr. Delana Meyer, Vascular Surgery, as indicated.   ____________________________ A. Lavone Orn, MD ams:cbb D: 08/31/2011 10:26:59 ET T: 09/01/2011 12:15:55 ET JOB#: [id number]  cc: A. Lavone Orn, MD, <Dictator> Don C. Mable Fill, MD Sherlon Handing MD ELECTRONICALLY SIGNED 09/08/2011 13:20

## 2014-10-08 NOTE — Op Note (Signed)
PATIENT NAME:  Christine, [name] MR#:  [id number] DATE OF BIRTH:  07/04/1945  DATE OF PROCEDURE:  08/26/2011  PREOPERATIVE DIAGNOSIS: Atherosclerotic occlusive disease of bilateral lower extremities with rest pain of left lower extremity.   POSTOPERATIVE DIAGNOSIS: Atherosclerotic occlusive disease of bilateral lower extremities with rest pain of left lower extremity.   PROCEDURES PERFORMED:  1. Abdominal aortogram.  2. Left lower extremity distal runoff, third order catheter placement.  3. Percutaneous transluminal angioplasty with stent placement, left external iliac artery.  4. Percutaneous transluminal angioplasty and stent placement, left superficial femoral artery.   SURGEON: Hortencia Pilar, MD   SEDATION: Versed 4 mg plus fentanyl 175 mcg administered IV. Continuous ECG, pulse oximetry and cardiopulmonary monitoring was performed throughout the entire procedure by the Interventional Radiology nurse. Total sedation time was 1.5 hours.   ACCESS: 6-French sheath right common femoral artery.   CONTRAST USED: Isovue 110 mL.   FLUOROSCOPY TIME: 11.4 minutes.   INDICATIONS: Christine [name] is a 69 year old woman who presented to the office with continuous severe pain of the left lower extremity. Physical examination as well as noninvasive studies demonstrated profound atherosclerotic occlusive disease. The risks and benefits for angiography with the hope for intervention were reviewed with the patient. All questions have been answered. Alternative therapies were also reviewed, and the patient has agreed to proceed.   DESCRIPTION OF PROCEDURE: The patient is taken to the Special Procedures suite and placed in the supine position. After adequate sedation is achieved, both groins are prepped and draped in sterile fashion. One percent lidocaine is infiltrated in the soft tissues overlying the common femoral impulse. The ultrasound is placed in a sterile sleeve. Ultrasound is utilized secondary to lack of  appropriate landmarks and to avoid vascular injury. Under direct ultrasound visualization, the femoral artery is identified. It is pulsatile and homogeneous, indicating patency. Image is recorded for the permanent record, and under real-time visualization a micropuncture needle is inserted into the common femoral artery, a microwire followed by a micro sheath, J-wire followed by a 5-French pigtail catheter is inserted. The pigtail catheter is positioned at the level of T12, and AP projection of the aorta is obtained with a bolus injection of contrast. The pigtail catheter is repositioned and RAO projection of the pelvis is obtained. Using a Glidewire and a pigtail catheter, the bifurcation is crossed. The wire is negotiated through a critical stenosis of the distal external iliac. The pigtail catheter is then advanced into the profunda femoris and distal runoff is obtained. The pigtail catheter is then negotiated into the superficial femoral artery where again distal runoff is obtained. The Advantage Wire was then reintroduced and 5000 units of heparin was given An Ansel sheath is advanced up and over the bifurcation and positioned in the proximal external. A 5 x 4 balloon is then used to angioplasty the critical stenosis in the distal external iliac artery. Follow-up angiography demonstrates significant residual plaque with an area that appears to be a dissection. Using a straight slip catheter and the Advantage Wire, the critical stenosis/subtotal occlusion of the SFA is crossed. A 4 x 20 balloon is then used to angioplasty the SFA. Three serial inflations are utilized. Follow-up angiography demonstrates that there is a flow-limiting dissection at Hunter's canal. Therefore, a 6 x 60 LifeStent is deployed across this region postdilated using the [facility] balloon. Follow-up angiography demonstrates that there is now a smooth contour to the SFA throughout its entire course. A previous dissection has been well treated.  The sheath  is then readjusted and magnified views of the distal external iliac are obtained, again noting the dissection, and this is treated with a 6 x 40 LifeStent post dilated to 5 mm. The sheath is then pulled into the right external. Oblique view is obtained, and a Mynx device is deployed successfully without difficulty. There are no immediate complications.   INTERPRETATION: The aorta is diffusely diseased with two separate areas of narrowing that reached approximately 50 to 60%. The common iliac arteries demonstrate moderate disease bilaterally, but it does not appear to be hemodynamically significant. The external iliac artery on the left demonstrates a critical stenosis distally, moderate stenosis more proximally. The common femoral artery on the left is patent, as is the profunda femoris. The superficial femoral artery demonstrates diffuse significant disease in the proximal one third. There is subtotal occlusion at Hunter's canal in the midportion. Distally the popliteal artery is patent, and there is two-vessel runoff via the peroneal and the posterior tibial.   Following angioplasty in both the external iliac as well as the SFA, there are significant residual dissections; and therefore LifeStents are deployed as noted above. Following postdilatation, there is complete resolution of these abnormalities with rapid flow of contrast now.   SUMMARY: Successful recanalization of the left lower extremity, multilevel, as described above.    ____________________________ Katha Cabal, MD ggs:cbb D: 08/26/2011 17:13:28 ET T: 08/27/2011 10:40:29 ET JOB#: [id number]  cc: Katha Cabal, MD, <Dictator> Don C. Mable Fill, MD Katha Cabal MD ELECTRONICALLY SIGNED 09/02/2011 12:21

## 2014-10-08 NOTE — H&P (Signed)
PATIENT NAME:  Christine [name], Christine [name] MR#:  [id number] DATE OF BIRTH:  September 01, 1945  DATE OF ADMISSION:  08/30/2011  PRIMARY CARE PHYSICIAN:  Dr. Mable Fill.  CHIEF COMPLAINT: Foot pain.   HISTORY OF PRESENT ILLNESS: This is a 69 year old female who has been having issues with her leg for a while now. Five months ago she went to the urgent care, then she saw a podiatrist after that who put her in a boot, then she had swelling after that. She came to the ER twice, was referred to Dr. Delana Meyer who last week did an angioplasty on her legs for peripheral vascular disease and given her hydrocodone. Her legs have been swollen and painful for a long time now. Since then, three days ago, she has noticed eruption and draining on the top of the left foot. She has been having some fever and chills and been sleeping a lot. She missed chemotherapy last Monday with the procedure. She states that she has been unable to walk this past week with the pain in the foot. Currently, she is afebrile and has no white count. Hospitalist services were contacted for evaluation.   PAST MEDICAL HISTORY:  1. Left breast cancer, stage III. Last chemotherapy probably around four weeks ago. 2. Peripheral vascular disease. 3. Coronary artery disease. 4. Cardiomyopathy. 5. Hypertension. 6. Gastroesophageal reflux disease. 7. Depression, anxiety.  8. Hyperlipidemia.   PAST SURGICAL HISTORY:  1. Defibrillator.  2. Three stents in the heart. 3. Angioplasty of the legs.  4. Port-A-Cath placement.   ALLERGIES: Oxycodone.   MEDICATIONS:  1. Hydrocodone/acetaminophen 7.5/325, one tablet every six hours as needed.  2. Aspirin 81 mg daily.  3. Plavix 75 mg daily.  4. Coreg 25 mg twice a day.  5. Crestor 10 mg daily.  6. Diovan 320 mg daily.  7. Fish oil 1000 mg daily. 8. Lasix 40 mg daily.  9. Lorazepam 0.5 mg at bedtime.  10. Omeprazole 20 mg twice a day. 11. ProAir 2 puffs as needed for shortness of breath.  12. Promethazine 25 mg  every six hours as needed for nausea or vomiting. 13. Zoloft 50 mg daily.  14. Tramadol p.r.n.  15. Tylenol Extra Strength p.r.n.  16. Vitamin D 2000 international units daily.   SOCIAL HISTORY: Smokes some cigarettes on and off for a while now. No alcohol. No drug use. Lives with her family. She was a Artist in the past.   FAMILY HISTORY: Mother died at 22 of a cerebrovascular accident. Father died at 30 of a heart problem, myocardial infarction.   REVIEW OF SYSTEMS: CONSTITUTIONAL: Positive for fever. Positive for chills. Positive for weight loss, 15 pounds. Positive for weakness, using a walker. EYES: She does wear glasses. EARS, NOSE, MOUTH, AND THROAT: Decreased hearing. Positive for runny nose, dry mouth. CARDIOVASCULAR: Positive for chest pain. She thinks with the lymph node, describes it as a heaviness feeling in the chest. She has been under a lot of stress lately. RESPIRATORY: No shortness of breath. No coughing. No sputum. No hemoptysis. GASTROINTESTINAL: Positive for abdominal pain in the upper abdomen and constipation. No bright red blood per rectum. No melena. No nausea or vomiting. GENITOURINARY: Increased urination. No hematuria. MUSCULOSKELETAL: Positive for foot pain. INTEGUMENTARY: Positive for redness on the top of the foot the last three days. NEUROLOGIC: Dizziness with looking up, no fainting or blackouts. PSYCHIATRIC: On medication for anxiety, depression. ENDOCRINE: No thyroid problems. HEMATOLOGIC/LYMPHATIC: No anemia that she knows of.   PHYSICAL EXAMINATION:  VITAL SIGNS:  Temperature 97.2, pulse 71, respirations 18, blood pressure 112/68, pulse oximetry 99%.   GENERAL: No respiratory distress.   EYES: Conjunctivae pale. Lids normal. Pupils equal, round, and reactive to light. Extraocular muscles intact. No nystagmus.   EARS, NOSE, MOUTH, AND THROAT: Nasal mucosa, no erythema. Throat, no erythema. No exudate seen. Lips and gums, no lesions.   NECK: No JVD. No  bruits. No lymphadenopathy. No thyromegaly. No thyroid nodules palpated.   LUNGS: Lungs are clear to auscultation. No use of accessory muscles to breathe. No rhonchi, rales, or wheeze heard.   HEART: S1, S2 normal. No gallops, rubs, or murmurs heard. Carotid upstroke 2+ bilaterally. No bruits.   EXTREMITIES: Dorsalis pedis pulses 1+ bilaterally. Trace edema of the lower extremities, left greater than right.   ABDOMEN: Soft. Slight tenderness in the epigastric area. No organomegaly/splenomegaly. Normoactive bowel sounds.   LYMPHATIC: No lymph nodes in the neck or groin.   MUSCULOSKELETAL: Trace edema. No clubbing. No cyanosis.   SKIN: On the top of the left foot erythema surrounding secondary excoriations and positive for warmth.   NEUROLOGIC: Cranial nerves II through XII grossly intact. Deep tendon reflexes 1+ bilateral lower extremities.   PSYCHIATRIC: The patient is oriented to person, place, and time.   LABORATORY, DIAGNOSTIC, AND RADIOLOGICAL DATA: White blood cell count 6.3, hemoglobin and hematocrit 8.7 and hematocrit 26.3, platelet count 188, glucose 85, BUN 10, creatinine 0.57, sodium 144, potassium 3.5, chloride 107, CO2 29, calcium 8.5, anion gap 8.   ASSESSMENT AND PLAN:  1. Cellulitis, unable to walk. We will admit as an observation since the patient does not have a white count or fever at this point. We will get physical therapy to see how she is able to ambulate. Will give IV Ancef 1 gram IV q.8 hours.  2. The patient did complain of chest pain with her history of coronary artery disease. We will put on off unit telemetry and serial cardiac enzymes. The patient believes it is secondary to stress or to her lymph node. Will continue aspirin, Plavix and Coreg at this point.  3. History of breast cancer, stage III. Needs oncology follow-up once completed treatment for infection.  4. Peripheral vascular disease status post angioplasty. Continue pain control.  5. Hypertension.  Continue Coreg, Diovan and Lasix. Blood pressure is well controlled.  6. Gastroesophageal reflux disease. Continue omeprazole.  7. Depression and anxiety. Continue Zoloft.  8. Hyperlipidemia. Continue Crestor. Check a lipid profile in the a.m.  9. Anemia. Hemoglobin is 8.7 and was previously 12.6. Will guaiac stools, send off iron studies and start ferrous sulfate and recheck a hemoglobin in the a.m.   CODE STATUS: The patient is a FULL CODE.   TIME SPENT ON ADMISSION: 55 minutes.   ____________________________ Tana Conch. Leslye Peer, MD rjw:ap D: 08/30/2011 18:07:26 ET T: 08/31/2011 07:15:17 ET JOB#: [id number]  cc: Tana Conch. Leslye Peer, MD, <Dictator> Don C. Mable Fill, MD Marisue Brooklyn MD ELECTRONICALLY SIGNED 08/31/2011 21:13

## 2014-10-31 DIAGNOSIS — R519 Headache, unspecified: Secondary | ICD-10-CM | POA: Insufficient documentation

## 2014-10-31 DIAGNOSIS — R51 Headache: Secondary | ICD-10-CM

## 2014-12-20 ENCOUNTER — Other Ambulatory Visit: Payer: Self-pay

## 2014-12-20 ENCOUNTER — Emergency Department: Payer: Medicare Other

## 2014-12-20 ENCOUNTER — Emergency Department
Admission: EM | Admit: 2014-12-20 | Discharge: 2014-12-20 | Disposition: A | Discharge status: Home / Self Care | Payer: Medicare Other | Attending: Emergency Medicine | Admitting: Emergency Medicine

## 2014-12-20 DIAGNOSIS — R0600 Dyspnea, unspecified: Secondary | ICD-10-CM

## 2014-12-20 DIAGNOSIS — I10 Essential (primary) hypertension: Secondary | ICD-10-CM | POA: Insufficient documentation

## 2014-12-20 DIAGNOSIS — R531 Weakness: Secondary | ICD-10-CM

## 2014-12-20 DIAGNOSIS — Z720 Tobacco use: Secondary | ICD-10-CM | POA: Diagnosis not present

## 2014-12-20 DIAGNOSIS — M6281 Muscle weakness (generalized): Secondary | ICD-10-CM | POA: Diagnosis not present

## 2014-12-20 DIAGNOSIS — R55 Syncope and collapse: Secondary | ICD-10-CM | POA: Diagnosis not present

## 2014-12-20 DIAGNOSIS — Z79899 Other long term (current) drug therapy: Secondary | ICD-10-CM | POA: Diagnosis not present

## 2014-12-20 LAB — CBC WITH DIFFERENTIAL/PLATELET
Basophils Absolute: 0 10*3/uL (ref 0–0.1)
Basophils Relative: 1 %
EOS PCT: 4 %
Eosinophils Absolute: 0.2 10*3/uL (ref 0–0.7)
HEMATOCRIT: 33.8 % — AB (ref 35.0–47.0)
Hemoglobin: 11 g/dL — ABNORMAL LOW (ref 12.0–16.0)
LYMPHS ABS: 1.5 10*3/uL (ref 1.0–3.6)
Lymphocytes Relative: 24 %
MCH: 28.9 pg (ref 26.0–34.0)
MCHC: 32.4 g/dL (ref 32.0–36.0)
MCV: 89.3 fL (ref 80.0–100.0)
Monocytes Absolute: 0.3 10*3/uL (ref 0.2–0.9)
Monocytes Relative: 6 %
Neutro Abs: 3.9 10*3/uL (ref 1.4–6.5)
Neutrophils Relative %: 65 %
Platelets: 158 10*3/uL (ref 150–440)
RBC: 3.79 MIL/uL — ABNORMAL LOW (ref 3.80–5.20)
RDW: 14.3 % (ref 11.5–14.5)
WBC: 6 10*3/uL (ref 3.6–11.0)

## 2014-12-20 LAB — COMPREHENSIVE METABOLIC PANEL
ALK PHOS: 88 U/L (ref 38–126)
ALT: 8 U/L — ABNORMAL LOW (ref 14–54)
ANION GAP: 8 (ref 5–15)
AST: 18 U/L (ref 15–41)
Albumin: 3.6 g/dL (ref 3.5–5.0)
BUN: 8 mg/dL (ref 6–20)
CALCIUM: 8.3 mg/dL — AB (ref 8.9–10.3)
CO2: 28 mmol/L (ref 22–32)
Chloride: 102 mmol/L (ref 101–111)
Creatinine, Ser: 1.02 mg/dL — ABNORMAL HIGH (ref 0.44–1.00)
GFR calc non Af Amer: 56 mL/min — ABNORMAL LOW (ref >60)
GLUCOSE: 102 mg/dL — AB (ref 65–99)
POTASSIUM: 2.7 mmol/L — AB (ref 3.5–5.1)
Sodium: 138 mmol/L (ref 135–145)
TOTAL PROTEIN: 6.7 g/dL (ref 6.5–8.1)
Total Bilirubin: 0.6 mg/dL (ref 0.3–1.2)

## 2014-12-20 LAB — TROPONIN I

## 2014-12-20 MED ORDER — POTASSIUM CHLORIDE ER 10 MEQ PO TBCR: 10.0000 meq | EXTENDED_RELEASE_TABLET | Freq: Two times a day (BID) | ORAL | Status: DC

## 2014-12-20 NOTE — ED Provider Notes (Signed)
[facility] Emergency Department Provider Note  Time seen: 8:35 PM  I have reviewed the triage vital signs and the nursing notes.   HISTORY  Chief Complaint Weakness and Shortness of Breath    HPI Christine [name] is a 69 y.o. female with a past medical history of anxiety, hyperlipidemia, hypertension, MI, breast cancer status post chemotherapy, pacemaker, presents to the emergency department with shortness of breath and near syncope. According to the patient she was outside working on her garden, she was lifting a bag of soil when she noted she was feeling short of breath and lightheaded. The patient states she went inside she called EMS, and took half an Ativan. EMS states the patient had normal vitals including 98% saturation, normal fingerstick. Patient denies any chest pain at any time. She states she feels much better now. Denies diaphoresis or nausea at any time. Patient has a history of an MI in 2006, denies any chest pain today. Patient states she thinks she just overdid it in the heat.     Past Medical History  Diagnosis Date  . Allergic rhinitis   . Anxiety   . Coronary artery disease   . GERD (gastroesophageal reflux disease)   . Hyperlipidemia   . Hypertension   . Osteoarthritis   . Chronic systolic heart failure   . Heart murmur   . MI (myocardial infarction)   . Breast cancer   . Implantable defibrillator St Jude     DOI 2006    Patient Active Problem List   Diagnosis Date Noted  . Thrombus 03/17/2011  . CARDIOMYOPATHY, ISCHEMIC 11/09/2008  . ICD - IN SITU 11/09/2008  . ANEMIA 07/30/2007  . ABNORMAL THYROID FUNCTION TESTS 07/30/2007  . HYPERLIPIDEMIA 11/16/2006  . Essential hypertension 11/16/2006  . Coronary atherosclerosis 11/16/2006  . FAILURE, SYSTOLIC HEART, CHRONIC 27/51/7001  . ALLERGIC RHINITIS 11/16/2006  . GERD 11/16/2006  . OSTEOARTHRITIS 11/16/2006  . BACK PAIN 11/16/2006    Past Surgical History  Procedure  Laterality Date  . Coronary stent placement  8/06    3 stents; myocardial infarction  . Pacemaker placement  2/07    Defibrillator  . Chf exacerbation  10/07  . Nm myoview ltd  1/08    EF 39%, no sx ischemia    Current Outpatient Rx  Name  Route  Sig  Dispense  Refill  . Acetaminophen 500 MG coapsule   Oral   Take 500-1,000 mg by mouth every 4 (four) hours as needed for pain.          Marland Kitchen albuterol (PROAIR HFA) 108 (90 BASE) MCG/ACT inhaler   Inhalation   Inhale 2 puffs into the lungs every 4 (four) hours as needed for wheezing or shortness of breath.          . anastrozole (ARIMIDEX) 1 MG tablet   Oral   Take 1 mg by mouth daily.          . bisacodyl (DULCOLAX) 5 MG EC tablet   Oral   Take 5 mg by mouth daily as needed for mild constipation or moderate constipation.         . carvedilol (COREG) 25 MG tablet      TAKE 1 TABLET BY MOUTH TWICE A DAY   60 tablet   2   . Cholecalciferol (VITAMIN D) 2000 UNITS tablet   Oral   Take 2,000 Units by mouth daily.         Marland Kitchen DIOVAN 320 MG tablet  TAKE 1 TABLET BY MOUTH EVERY DAY   30 tablet   0   . furosemide (LASIX) 40 MG tablet   Oral   Take 40 mg by mouth daily as needed for fluid.          Marland Kitchen loratadine (CLARITIN) 10 MG tablet   Oral   Take 10 mg by mouth daily as needed for allergies.         Marland Kitchen LORazepam (ATIVAN) 0.5 MG tablet   Oral   Take 0.5 mg by mouth 2 (two) times daily as needed for anxiety.          Marland Kitchen omeprazole (PRILOSEC) 20 MG capsule   Oral   Take 20 mg by mouth 2 (two) times daily.           . rosuvastatin (CRESTOR) 10 MG tablet   Oral   Take 10 mg by mouth at bedtime.           . sodium chloride (OCEAN) 0.65 % nasal spray   Nasal   Place 2 sprays into the nose every 4 (four) hours as needed. In each nostril.          Marland Kitchen spironolactone (ALDACTONE) 25 MG tablet   Oral   Take 0.5 tablets (12.5 mg total) by mouth daily.   30 tablet   6   . traMADol (ULTRAM) 50 MG  tablet   Oral   Take 50 mg by mouth every 6 (six) hours as needed for moderate pain.            Allergies Atorvastatin; Lisinopril; Oxycodone hcl; and Simvastatin  Family History  Problem Relation Age of Onset  . Stroke Mother 59  . Heart attack Father   . Cancer Neg Hx     No breast or colon cancer  . Heart attack Brother   . Coronary artery disease Other   . Hypertension Other   . Coronary artery disease Father     Social History History  Substance Use Topics  . Smoking status: Current Every Day Smoker -- 0.50 packs/day  . Smokeless tobacco: Not on file  . Alcohol Use: No     Comment: 2-3 times per month    Review of Systems Constitutional: Negative for fever.  generalized weakness now resolved. Cardiovascular: Negative for chest pain. Respiratory: Negative for shortness of breath.occasional cough. Gastrointestinal: Negative for abdominal pain, vomiting and diarrhea. Genitourinary: Negative for dysuria. Musculoskeletal: Negative for back pain. Neurological: Negative for headache 10-point ROS otherwise negative.  ____________________________________________   PHYSICAL EXAM:  VITAL SIGNS: ED Triage Vitals  Enc Vitals Group     BP --      Pulse --      Resp --      Temp --      Temp src --      SpO2 --      Weight --      Height --      Head Cir --      Peak Flow --      Pain Score --      Pain Loc --      Pain Edu? --      Excl. in [place]? --     Constitutional: Alert and oriented. Well appearing and in no distress.smells of cigarettes. Eyes: Normal exam ENT   Mouth/Throat: Mucous membranes are moist. Cardiovascular: Normal rate, regular rhythm. No murmur Respiratory: Normal respiratory effort without tachypnea nor retractions. Breath sounds are clear and equal bilaterally. No  wheezes/rales/rhonchi.pacemaker present in the left chest. Gastrointestinal: Soft and nontender. No distention.  Musculoskeletal: Nontender with normal range of motion in  all extremities. No lower extremity tenderness or edema. Neurologic:  Normal speech and language. No gross focal neurologic deficits  Skin:  Skin is warm, dry and intact.  Psychiatric: Mood and affect are normal. Speech and behavior are normal.   ____________________________________________    EKG  EKG reviewed and interpreted by myself shows a heart rate of 80 bpm, appears to have pacer spikes in front of half of the leads likely demand pacing. Nonspecific ST changes are present, PVCs are present. No ST elevations noted.  ____________________________________________    RADIOLOGY  Chest x-ray shows no acute abnormalities.  ____________________________________________   INITIAL IMPRESSION / ASSESSMENT AND PLAN / ED COURSE  Pertinent labs & imaging results that were available during my care of the patient were reviewed by me and considered in my medical decision making (see chart for details).  Overall very well-appearing patient who states she had a brief episode of shortness of breath and generalized weakness. She took half an Ativan and now feels much better. Denies any chest pain at any time. She states this occurred while she was outside working in the heat. We will check labs, chest x-ray, and EKG. Patient denies any symptoms currently at this time.   Chest x-ray shows no acute abnormalities, EKG does not show any ST elevations. Patient denies any chest pain. Patient shortness of breath has resolved at this time. Labs show hypokalemia but otherwise within normal limits. We'll place the patient on potassium supplements and have her follow up with her primary care doctor. Discussed strict return precautions with the patient which she is agreeable. ____________________________________________   FINAL CLINICAL IMPRESSION(S) / ED DIAGNOSES  Dyspnea Near-syncope   Harvest Dark, MD 12/20/14 [phone]

## 2014-12-20 NOTE — Discharge Instructions (Signed)
Weakness Weakness is a lack of strength. You may feel weak all over your body or just in one part of your body. Weakness can be serious. In some cases, you may need more medical tests. HOME [place] a well-balanced diet.  Try to exercise every day.  Only take medicines as told by your doctor. GET HELP RIGHT AWAY IF:   You cannot do your normal daily activities.  You cannot walk up and down stairs, or you feel very tired when you do so.  You have shortness of breath or chest pain.  You have trouble moving parts of your body.  You have weakness in only one body part or on only one side of the body.  You have a fever.  You have trouble speaking or swallowing.  You cannot control when you pee (urinate) or poop (bowel movement).  You have black or bloody throw up (vomit) or poop.  Your weakness gets worse or spreads to other body parts.  You have new aches or pains. MAKE SURE YOU:   Understand these instructions.  Will watch your condition.  Will get help right away if you are not doing well or get worse. Document Released: 05/15/2008 Document Revised: 12/02/2011 Document Reviewed: 08/01/2011 [facility] Patient Information 2015 [place], [place]. This information is not intended to replace advice given to you by your health care provider. Make sure you discuss any questions you have with your health care provider.    Please follow-up with your primary care doctor soon as possible. As we discussed please return to the emergency department for any chest pain, pressure, tightness, worsening shortness of breath, or any other symptom personally concerning to self. Please drink plenty of fluids over the next few days, with plenty of rest.

## 2014-12-20 NOTE — ED Notes (Signed)
Patient was outside and bent over. When she stood up, she got SOB and left-sided chest pain.

## 2015-01-22 ENCOUNTER — Emergency Department: Payer: Medicare Other

## 2015-01-22 ENCOUNTER — Emergency Department
Admission: EM | Admit: 2015-01-22 | Discharge: 2015-01-22 | Disposition: A | Discharge status: Home / Self Care | Payer: Medicare Other | Attending: Emergency Medicine | Admitting: Emergency Medicine

## 2015-01-22 ENCOUNTER — Encounter: Payer: Self-pay | Admitting: Emergency Medicine

## 2015-01-22 DIAGNOSIS — S90122A Contusion of left lesser toe(s) without damage to nail, initial encounter: Secondary | ICD-10-CM

## 2015-01-22 DIAGNOSIS — I10 Essential (primary) hypertension: Secondary | ICD-10-CM | POA: Diagnosis not present

## 2015-01-22 DIAGNOSIS — W208XXA Other cause of strike by thrown, projected or falling object, initial encounter: Secondary | ICD-10-CM | POA: Diagnosis not present

## 2015-01-22 DIAGNOSIS — Y998 Other external cause status: Secondary | ICD-10-CM | POA: Insufficient documentation

## 2015-01-22 DIAGNOSIS — Y9289 Other specified places as the place of occurrence of the external cause: Secondary | ICD-10-CM | POA: Insufficient documentation

## 2015-01-22 DIAGNOSIS — Y9389 Activity, other specified: Secondary | ICD-10-CM | POA: Insufficient documentation

## 2015-01-22 DIAGNOSIS — Z79899 Other long term (current) drug therapy: Secondary | ICD-10-CM | POA: Insufficient documentation

## 2015-01-22 DIAGNOSIS — Z720 Tobacco use: Secondary | ICD-10-CM | POA: Insufficient documentation

## 2015-01-22 DIAGNOSIS — L03116 Cellulitis of left lower limb: Secondary | ICD-10-CM | POA: Diagnosis not present

## 2015-01-22 DIAGNOSIS — S90112A Contusion of left great toe without damage to nail, initial encounter: Secondary | ICD-10-CM | POA: Insufficient documentation

## 2015-01-22 DIAGNOSIS — M79675 Pain in left toe(s): Secondary | ICD-10-CM | POA: Diagnosis present

## 2015-01-22 MED ORDER — HYDROCODONE-ACETAMINOPHEN 5-325 MG PO TABS
1.0000 | ORAL_TABLET | Freq: Once | ORAL | Status: AC
  Administered 2015-01-22: 1 via ORAL
  Filled 2015-01-22: qty 1

## 2015-01-22 MED ORDER — HYDROCODONE-ACETAMINOPHEN 5-325 MG PO TABS: 1.0000 | ORAL_TABLET | ORAL | Status: DC | PRN

## 2015-01-22 MED ORDER — CEPHALEXIN 500 MG PO CAPS: 500.0000 mg | ORAL_CAPSULE | Freq: Four times a day (QID) | ORAL | Status: DC

## 2015-01-22 MED ORDER — CEPHALEXIN 500 MG PO CAPS
500.0000 mg | ORAL_CAPSULE | Freq: Once | ORAL | Status: AC
  Administered 2015-01-22: 500 mg via ORAL
  Filled 2015-01-22: qty 1

## 2015-01-22 NOTE — Discharge Instructions (Signed)
Cellulitis Cellulitis is an infection of the skin and the tissue under the skin. The infected area is usually red and tender. This happens most often in the arms and lower legs. HOME CARE   Take your antibiotic medicine as told. Finish the medicine even if you start to feel better.  Keep the infected arm or leg raised (elevated).  Put a warm cloth on the area up to 4 times per day.  Only take medicines as told by your doctor.  Keep all doctor visits as told. GET HELP IF:  You see red streaks on the skin coming from the infected area.  Your red area gets bigger or turns a dark color.  Your bone or joint under the infected area is painful after the skin heals.  Your infection comes back in the same area or different area.  You have a puffy (swollen) bump in the infected area.  You have new symptoms.  You have a fever. GET HELP RIGHT AWAY IF:   You feel very sleepy.  You throw up (vomit) or have watery poop (diarrhea).  You feel sick and have muscle aches and pains. MAKE SURE YOU:   Understand these instructions.  Will watch your condition.  Will get help right away if you are not doing well or get worse. Document Released: 11/19/2007 Document Revised: 10/17/2013 Document Reviewed: 08/18/2011 [facility] Patient Information 2015 [place], [place]. This information is not intended to replace advice given to you by your health care provider. Make sure you discuss any questions you have with your health care provider.  Contusion A contusion is a deep bruise. Contusions happen when an injury causes bleeding under the skin. Signs of bruising include pain, puffiness (swelling), and discolored skin. The contusion may turn blue, purple, or yellow. HOME CARE   Put ice on the injured area.  Put ice in a plastic bag.  Place a towel between your skin and the bag.  Leave the ice on for 15-20 minutes, 03-04 times a day.  Only take medicine as told by your doctor.  Rest the  injured area.  If possible, raise (elevate) the injured area to lessen puffiness. GET HELP RIGHT AWAY IF:   You have more bruising or puffiness.  You have pain that is getting worse.  Your puffiness or pain is not helped by medicine. MAKE SURE YOU:   Understand these instructions.  Will watch your condition.  Will get help right away if you are not doing well or get worse. Document Released: 11/19/2007 Document Revised: 08/25/2011 Document Reviewed: 04/07/2011 [facility] Patient Information 2015 [place], [place]. This information is not intended to replace advice given to you by your health care provider. Make sure you discuss any questions you have with your health care provider.

## 2015-01-22 NOTE — ED Provider Notes (Signed)
[facility] Emergency Department Provider Note  ____________________________________________  Time seen: Approximately 8:10 PM  I have reviewed the triage vital signs and the nursing notes.   HISTORY  Chief Complaint Toe Pain   HPI Christine [name] is a 69 y.o. female who presents for evaluation of left great toe pain. Patient states she dropped a paper weight on it about 8 days ago and is concerned about increased tenderness and redness. Past medical history significant for cellulitis of the same foot about 3 years ago.  Past Medical History  Diagnosis Date  . Allergic rhinitis   . Anxiety   . Coronary artery disease   . GERD (gastroesophageal reflux disease)   . Hyperlipidemia   . Hypertension   . Osteoarthritis   . Chronic systolic heart failure   . Heart murmur   . MI (myocardial infarction)   . Breast cancer   . Implantable defibrillator St Jude     DOI 2006    Patient Active Problem List   Diagnosis Date Noted  . Thrombus 03/17/2011  . CARDIOMYOPATHY, ISCHEMIC 11/09/2008  . ICD - IN SITU 11/09/2008  . ANEMIA 07/30/2007  . ABNORMAL THYROID FUNCTION TESTS 07/30/2007  . HYPERLIPIDEMIA 11/16/2006  . Essential hypertension 11/16/2006  . Coronary atherosclerosis 11/16/2006  . FAILURE, SYSTOLIC HEART, CHRONIC 24/82/5003  . ALLERGIC RHINITIS 11/16/2006  . GERD 11/16/2006  . OSTEOARTHRITIS 11/16/2006  . BACK PAIN 11/16/2006    Past Surgical History  Procedure Laterality Date  . Coronary stent placement  8/06    3 stents; myocardial infarction  . Pacemaker placement  2/07    Defibrillator  . Chf exacerbation  10/07  . Nm myoview ltd  1/08    EF 39%, no sx ischemia    Current Outpatient Rx  Name  Route  Sig  Dispense  Refill  . Acetaminophen 500 MG coapsule   Oral   Take 500-1,000 mg by mouth every 4 (four) hours as needed for pain.          Marland Kitchen albuterol (PROAIR HFA) 108 (90 BASE) MCG/ACT inhaler   Inhalation   Inhale 2 puffs into  the lungs every 4 (four) hours as needed for wheezing or shortness of breath.          . anastrozole (ARIMIDEX) 1 MG tablet   Oral   Take 1 mg by mouth daily.          . bisacodyl (DULCOLAX) 5 MG EC tablet   Oral   Take 5 mg by mouth daily as needed for mild constipation or moderate constipation.         . carvedilol (COREG) 25 MG tablet      TAKE 1 TABLET BY MOUTH TWICE A DAY   60 tablet   2   . cephALEXin (KEFLEX) 500 MG capsule   Oral   Take 1 capsule (500 mg total) by mouth 4 (four) times daily.   40 capsule   0   . Cholecalciferol (VITAMIN D) 2000 UNITS tablet   Oral   Take 2,000 Units by mouth daily.         Marland Kitchen DIOVAN 320 MG tablet      TAKE 1 TABLET BY MOUTH EVERY DAY   30 tablet   0   . furosemide (LASIX) 40 MG tablet   Oral   Take 40 mg by mouth daily as needed for fluid.          Marland Kitchen HYDROcodone-acetaminophen (NORCO) 5-325 MG per tablet  Oral   Take 1-2 tablets by mouth every 4 (four) hours as needed for moderate pain.   15 tablet   0   . loratadine (CLARITIN) 10 MG tablet   Oral   Take 10 mg by mouth daily as needed for allergies.         Marland Kitchen LORazepam (ATIVAN) 0.5 MG tablet   Oral   Take 0.5 mg by mouth 2 (two) times daily as needed for anxiety.          Marland Kitchen omeprazole (PRILOSEC) 20 MG capsule   Oral   Take 20 mg by mouth 2 (two) times daily.           . potassium chloride (K-DUR) 10 MEQ tablet   Oral   Take 1 tablet (10 mEq total) by mouth 2 (two) times daily.   30 tablet   0   . rosuvastatin (CRESTOR) 10 MG tablet   Oral   Take 10 mg by mouth at bedtime.           . sodium chloride (OCEAN) 0.65 % nasal spray   Nasal   Place 2 sprays into the nose every 4 (four) hours as needed. In each nostril.          Marland Kitchen spironolactone (ALDACTONE) 25 MG tablet   Oral   Take 0.5 tablets (12.5 mg total) by mouth daily.   30 tablet   6   . traMADol (ULTRAM) 50 MG tablet   Oral   Take 50 mg by mouth every 6 (six) hours as needed for  moderate pain.            Allergies Atorvastatin; Lisinopril; Oxycodone hcl; and Simvastatin  Family History  Problem Relation Age of Onset  . Stroke Mother 38  . Heart attack Father   . Cancer Neg Hx     No breast or colon cancer  . Heart attack Brother   . Coronary artery disease Other   . Hypertension Other   . Coronary artery disease Father     Social History History  Substance Use Topics  . Smoking status: Current Every Day Smoker -- 0.50 packs/day  . Smokeless tobacco: Not on file  . Alcohol Use: No     Comment: 2-3 times per month    Review of Systems Constitutional: No fever/chills Eyes: No visual changes. ENT: No sore throat. Cardiovascular: Denies chest pain. Respiratory: Denies shortness of breath. Gastrointestinal: No abdominal pain.  No nausea, no vomiting.  No diarrhea.  No constipation. Genitourinary: Negative for dysuria. Musculoskeletal: Positive for left great toe pain. Skin: Negative for rash. Neurological: Negative for headaches, focal weakness or numbness.  10-point ROS otherwise negative.  ____________________________________________   PHYSICAL EXAM:  VITAL SIGNS: ED Triage Vitals  Enc Vitals Group     BP 01/22/15 1929 141/85 mmHg     Pulse Rate 01/22/15 1929 66     Resp 01/22/15 1929 20     Temp 01/22/15 1929 97.8 F (36.6 C)     Temp Source 01/22/15 1929 Oral     SpO2 01/22/15 1929 98 %     Weight 01/22/15 1929 100 lb (45.36 kg)     Height 01/22/15 1929 5\' 2"  (1.575 m)     Head Cir --      Peak Flow --      Pain Score 01/22/15 1929 4     Pain Loc --      Pain Edu? --      Excl. in [place]? --  Constitutional: Alert and oriented. Well appearing and in no acute distress. Cardiovascular: Normal rate, regular rhythm. Grossly normal heart sounds.  Good peripheral circulation. Respiratory: Normal respiratory effort.  No retractions. Lungs CTAB. Gastrointestinal: Soft and nontender. No distention. No abdominal bruits. No CVA  tenderness. Musculoskeletal: Positive left great toe pain with erythema noted to the toe and distal aspect of the foot..  No joint effusions. Neurologic:  Normal speech and language. No gross focal neurologic deficits are appreciated. No gait instability. Skin:  Skin is warm, dry and intact. No rash noted. Psychiatric: Mood and affect are normal. Speech and behavior are normal.  ____________________________________________   LABS (all labs ordered are listed, but only abnormal results are displayed)  Labs Reviewed - No data to display ____________________________________________   RADIOLOGY  Negative for fracture dislocation interpreted by radiologist and reviewed by myself. ____________________________________________   PROCEDURES  Procedure(s) performed: None  Critical Care performed: No  ____________________________________________   INITIAL IMPRESSION / ASSESSMENT AND PLAN / ED COURSE  Pertinent labs & imaging results that were available during my care of the patient were reviewed by me and considered in my medical decision making (see chart for details).  Diagnosis early cellulitis. Rx given for Keflex 500 mg 4 times a day based on clinical history cellulitis. Rx given for hydrocodone as needed for pain. Patient to follow up with PCP or return to the ER with any worsening symptomology. ____________________________________________   FINAL CLINICAL IMPRESSION(S) / ED DIAGNOSES  Final diagnoses:  Cellulitis of left lower extremity  Toe contusion, left, initial encounter      Arlyss Repress, PA-C 01/22/15 2044  Ponciano Ort, MD 01/23/15 0005

## 2015-01-22 NOTE — ED Notes (Addendum)
Paperweight fell to left great toe about 8 days ago, painful to touch and reddened and swollen. Patient has a history of cellulitis in left leg 3 years ago.

## 2015-01-22 NOTE — ED Notes (Addendum)
Patient reports dropping large glass paperweight on left great toe about 8 days ago, reports toe is not getting any better.

## 2015-03-26 ENCOUNTER — Encounter: Payer: Self-pay | Admitting: Internal Medicine

## 2015-04-18 ENCOUNTER — Telehealth: Payer: Self-pay | Admitting: *Deleted

## 2015-04-18 NOTE — Telephone Encounter (Signed)
Fraser Din has not been seen since August 2015 and does not have an appt. Per RN, she got tx at [facility] request denied

## 2015-05-08 ENCOUNTER — Ambulatory Visit (INDEPENDENT_AMBULATORY_CARE_PROVIDER_SITE_OTHER): Payer: Medicare Other | Admitting: Internal Medicine

## 2015-05-08 ENCOUNTER — Encounter: Payer: Self-pay | Admitting: Internal Medicine

## 2015-05-08 VITALS — BP 138/82 | HR 67 | Ht 62.0 in | Wt 96.5 lb

## 2015-05-08 DIAGNOSIS — I10 Essential (primary) hypertension: Secondary | ICD-10-CM

## 2015-05-08 NOTE — Patient Instructions (Signed)
Medication Instructions: - no changes  Labwork: - none  Procedures/Testing: - Generator Change- please call when ready to schedule ** Monday 12/12     Monday 12/19    Tuesday 12/20 (afternoon only)     Thursday 12/29  Follow-Up: - Pending generator change  Any Additional Special Instructions Will Be Listed Below (If Applicable).

## 2015-05-08 NOTE — Progress Notes (Signed)
Patient Care Team: Glendon Axe, MD as PCP - General (Internal Medicine)   HPI  Christine [name] is a 69 y.o. female Seen in followup for consideration of repositioning of her ICD. She has a history of breast cancer anda series of questions regarding what is the next step. There've been some consideration of mastectomy and the concern that the device needs to be removed prior to that surgery.  She has not decided what to do about mastectomy.  Echocardiogram 2012 demonstrated impaired LV function, i.e. 25-30% with apical thrombus.   Echocardiogram 9/15 demonstrated ejection fraction of 30-35% with a mural thrombus. No stalk was described. Myoview scan demonstrated similar ejection fraction with inferobasilar ischemia and a large infarct. She's having no angina. Dyspnea is stable. Unfortunately she continues to smoke.  Her device is at end of service and we need to make a decision regarding the mastectomy so as to know what  to do about her device    Past Medical History  Diagnosis Date  . Allergic rhinitis   . Anxiety   . Coronary artery disease   . GERD (gastroesophageal reflux disease)   . Hyperlipidemia   . Hypertension   . Osteoarthritis   . Chronic systolic heart failure ([place])   . Heart murmur   . MI (myocardial infarction) ([place])   . Breast cancer ([place])   . Implantable defibrillator St Jude     DOI 2006    Past Surgical History  Procedure Laterality Date  . Coronary stent placement  8/06    3 stents; myocardial infarction  . Pacemaker placement  2/07    Defibrillator  . Chf exacerbation  10/07  . Nm myoview ltd  1/08    EF 39%, no sx ischemia    Current Outpatient Prescriptions  Medication Sig Dispense Refill  . Acetaminophen 500 MG coapsule Take 500-1,000 mg by mouth every 4 (four) hours as needed for pain.     Marland Kitchen albuterol (PROAIR HFA) 108 (90 BASE) MCG/ACT inhaler Inhale 2 puffs into the lungs every 4 (four) hours as needed for wheezing or shortness of  breath.     . anastrozole (ARIMIDEX) 1 MG tablet Take 1 mg by mouth daily.     . bisacodyl (DULCOLAX) 5 MG EC tablet Take 5 mg by mouth daily as needed for mild constipation or moderate constipation.    . carvedilol (COREG) 25 MG tablet TAKE 1 TABLET BY MOUTH TWICE A DAY 60 tablet 2  . Cholecalciferol (VITAMIN D) 2000 UNITS tablet Take 2,000 Units by mouth daily.    Marland Kitchen DIOVAN 320 MG tablet TAKE 1 TABLET BY MOUTH EVERY DAY 30 tablet 0  . furosemide (LASIX) 40 MG tablet Take 40 mg by mouth daily as needed for fluid.     Marland Kitchen HYDROcodone-acetaminophen (NORCO) 5-325 MG per tablet Take 1-2 tablets by mouth every 4 (four) hours as needed for moderate pain. 15 tablet 0  . loratadine (CLARITIN) 10 MG tablet Take 10 mg by mouth daily as needed for allergies.    Marland Kitchen LORazepam (ATIVAN) 0.5 MG tablet Take 0.5 mg by mouth 2 (two) times daily as needed for anxiety.     Marland Kitchen omeprazole (PRILOSEC) 20 MG capsule Take 20 mg by mouth 2 (two) times daily.      . rosuvastatin (CRESTOR) 10 MG tablet Take 10 mg by mouth at bedtime.      . sodium chloride (OCEAN) 0.65 % nasal spray Place 2 sprays into the nose every  4 (four) hours as needed. In each nostril.     . traMADol (ULTRAM) 50 MG tablet Take 50 mg by mouth every 6 (six) hours as needed for moderate pain.      No current facility-administered medications for this visit.    Allergies  Allergen Reactions  . Atorvastatin     REACTION: increased LFT's  . Lisinopril     REACTION: cough  . Oxycodone Hcl     REACTION: unspecified  . Simvastatin     REACTION: myalgias    Review of Systems negative except from HPI and PMH  Physical Exam BP 138/82 mmHg  Pulse 67  Ht 5\' 2"  (1.575 m)  Wt 96 lb 8 oz (43.772 kg)  BMI 17.65 kg/m2 Well developed and well nourished in no acute distress HENT normal E scleral and icterus clear Neck Supple JVP flat; carotids brisk and full Clear to ausculation Device pocket well healed; without hematoma or erythema.  There is no  tethering Port-A-Cath on the right side of  Regular rate and rhythm, no murmurs gallops or rub Soft with active bowel sounds No clubbing cyanosis  Edema Alert and oriented, grossly normal motor and sensory function Skin Warm and Dry    Assessment and  Plan  Ischemic cardiomyopathy  Breast cancer  Implantable defibrillator at EOS   Tobacco abuse  Hypertension   She is not inclined at this time to pursue further thoughts regarding her breast cancer. She is at this point however willing to have her device changed out. It is at EOS  Last echocardiogram remains impaired.  The patient's device was interrogated.  The information was reviewed. No changes were made in the programming.  Ischemic and nonischemic thousand cc   We spent more than 50% of our >25 min visit in face to face counseling regarding the above

## 2015-05-17 ENCOUNTER — Telehealth: Payer: Self-pay | Admitting: *Deleted

## 2015-05-17 NOTE — Telephone Encounter (Signed)
No longer a patient of this clinic

## 2015-05-18 ENCOUNTER — Telehealth: Payer: Self-pay | Admitting: Internal Medicine

## 2015-05-18 NOTE — Telephone Encounter (Signed)
I called and spoke with the patient to touch base with her about scheduling her ICD generator change out. She tells me today that she has had car problems and a few other things going on. She apologized and wanted to call back after the 1st of the year to schedule. I advised her I will wait for a call back.

## 2015-06-20 ENCOUNTER — Encounter: Payer: Self-pay | Admitting: *Deleted

## 2015-07-13 ENCOUNTER — Encounter: Payer: Self-pay | Admitting: *Deleted

## 2015-07-16 DIAGNOSIS — F321 Major depressive disorder, single episode, moderate: Secondary | ICD-10-CM | POA: Insufficient documentation

## 2015-07-31 ENCOUNTER — Telehealth: Payer: Self-pay | Admitting: Internal Medicine

## 2015-07-31 NOTE — Telephone Encounter (Signed)
Patient brought in [facility] Clearance forms to be completed.  Sent to [employer] via Fax .  07/31/15 [facility]

## 2015-07-31 NOTE — Telephone Encounter (Signed)
Patient wants to schedule gen change out appt with Heather.

## 2015-07-31 NOTE — Telephone Encounter (Signed)
Pt has not had Diovan refilled since 2012.  Diovan last refilled by PCP.

## 2015-07-31 NOTE — Telephone Encounter (Signed)
Christine [name] w/ [employer] says they do not process dmv forms.   Returned for Dr. Caryl Comes to complete

## 2015-07-31 NOTE — Telephone Encounter (Signed)
Patient needs Diovan 320 mg po daily Prior Auth    [facility] is asking for additional information to refill this medication.  Auth Request reference [id number]

## 2015-07-31 NOTE — Telephone Encounter (Signed)
Reviewed with Dr. Caryl Comes- schedule for follow up office visit to update H&P since it was November since the patient was last seen.  Will forward to scheduling in [place] to call the patient for a follow up with Dr. Caryl Comes.

## 2015-07-31 NOTE — Telephone Encounter (Signed)
Placed in Nurse box.

## 2015-08-02 NOTE — Telephone Encounter (Signed)
Forms given to Alvis Lemmings, RN

## 2015-08-02 NOTE — Telephone Encounter (Signed)
Please see note below. 

## 2015-08-02 NOTE — Telephone Encounter (Signed)
I spoke with the patient. She is agreeable with seeing Dr. Caryl Comes on 08/07/15 at 9:30 am.

## 2015-08-02 NOTE — Telephone Encounter (Signed)
PA has been faxed to [facility] awaiting approval.

## 2015-08-02 NOTE — Telephone Encounter (Signed)
Spoke with the patient. She has been on Diovan 320 mg once daily. She moved to [facility] at once point and the process for her refills has been skewed since then. Ok to proceed with prior [place].

## 2015-08-03 NOTE — Telephone Encounter (Signed)
Pt has been denied coverage for Diovan 320 mg. Pt must have failed or tried the following drugs below: Irbesartan Valsartan Losartan

## 2015-08-06 NOTE — Progress Notes (Signed)
Patient Care Team: Glendon Axe, MD as PCP - General (Internal Medicine)   HPI  Christine [name] is a 70 y.o. female Seen in followup  with anticipation of generator replacement of her previously implanted and now at [place].    She has a history of breast cancer and had been discussions ongoing for several months as to what to do about her cancer surgery and possible repositioning of her defibrillator. She has decided not to pursue further oncological opinion and not to pursue mastectomy  .  Echocardiogram 2012 demonstrated impaired LV function, i.e. 25-30% with apical thrombus.   Echocardiogram 9/15 demonstrated ejection fraction of 30-35% with a mural thrombus. No stalk was described. Myoview scan demonstrated similar ejection fraction with inferobasilar ischemia and a large infarct. She's having no angina. Dyspnea is stable. Unfortunately she continues to smoke.   She is able to do her daily activities. She denies chest pain or significant shortness of breath.  Past Medical History  Diagnosis Date  . Allergic rhinitis   . Anxiety   . Coronary artery disease   . GERD (gastroesophageal reflux disease)   . Hyperlipidemia   . Hypertension   . Osteoarthritis   . Chronic systolic heart failure ([place])   . Heart murmur   . MI (myocardial infarction) ([place])   . Breast cancer ([place])   . Implantable defibrillator St Jude     DOI 2006    Past Surgical History  Procedure Laterality Date  . Coronary stent placement  8/06    3 stents; myocardial infarction  . Pacemaker placement  2/07    Defibrillator  . Chf exacerbation  10/07  . Nm myoview ltd  1/08    EF 39%, no sx ischemia    Current Outpatient Prescriptions  Medication Sig Dispense Refill  . Acetaminophen 500 MG coapsule Take 500-1,000 mg by mouth every 4 (four) hours as needed for pain.     Marland Kitchen albuterol (PROAIR HFA) 108 (90 BASE) MCG/ACT inhaler Inhale 2 puffs into the lungs every 4 (four) hours as needed for wheezing  or shortness of breath.     Marland Kitchen aspirin 81 MG tablet Take 81 mg by mouth daily.    . bisacodyl (DULCOLAX) 5 MG EC tablet Take 5 mg by mouth daily as needed for mild constipation or moderate constipation.    . carvedilol (COREG) 25 MG tablet TAKE 1 TABLET BY MOUTH TWICE A DAY 60 tablet 2  . Cholecalciferol (VITAMIN D) 2000 UNITS tablet Take 2,000 Units by mouth daily.    Marland Kitchen DIOVAN 320 MG tablet TAKE 1 TABLET BY MOUTH EVERY DAY 30 tablet 0  . furosemide (LASIX) 40 MG tablet Take 40 mg by mouth daily as needed for fluid.     Marland Kitchen HYDROcodone-acetaminophen (NORCO) 5-325 MG per tablet Take 1-2 tablets by mouth every 4 (four) hours as needed for moderate pain. 15 tablet 0  . loratadine (CLARITIN) 10 MG tablet Take 10 mg by mouth daily as needed for allergies.    Marland Kitchen LORazepam (ATIVAN) 0.5 MG tablet Take 0.5 mg by mouth 2 (two) times daily as needed for anxiety.     Marland Kitchen omeprazole (PRILOSEC) 20 MG capsule Take 20 mg by mouth 2 (two) times daily.      . rosuvastatin (CRESTOR) 10 MG tablet Take 10 mg by mouth at bedtime.      . sodium chloride (OCEAN) 0.65 % nasal spray Place 2 sprays into the nose every 4 (four) hours as  needed. In each nostril.     . traMADol (ULTRAM) 50 MG tablet Take 50 mg by mouth every 6 (six) hours as needed for moderate pain.      No current facility-administered medications for this visit.    Allergies  Allergen Reactions  . Atorvastatin     REACTION: increased LFT's  . Lisinopril     REACTION: cough  . Oxycodone Hcl     REACTION: unspecified  . Simvastatin     REACTION: myalgias    Review of Systems negative except from HPI and PMH  Physical Exam BP 132/78 mmHg  Pulse 63  Ht 5\' 2"  (1.575 m)  Wt 96 lb 8 oz (43.772 kg)  BMI 17.65 kg/m2 Well developed and well nourished in no acute distress HENT normal E scleral and icterus clear Neck Supple JVP flat; carotids brisk and full Clear to ausculation Device pocket well healed; without hematoma or erythema.  There is no  tethering Port-A-Cath on the right side of  Regular rate and rhythm, no murmurs gallops or rub Soft with active bowel sounds No clubbing cyanosis  Edema Alert and oriented, grossly normal motor and sensory function Skin Warm and Dry  ECG demonstrates sinus rhythm at 63 Intervals 16/09/46 Occasional atrial paced event  Assessment and  Plan  Ischemic cardiomyopathy  Breast cancer  Implantable defibrillator at EOS Dual chamber   Tobacco abuse  Hypertension  Without symptoms of ischemia  BP well controlled   She has lost her brother and the last of her 13 uncles.. She is not sure that she would like to proceed with generator replacement. This is a primary prevention ICD. We have decided to regroup and rethink about this in about 6 months  We spent more than 50% of our >25 min visit in face to face counseling regarding the above

## 2015-08-07 ENCOUNTER — Encounter: Payer: Self-pay | Admitting: Internal Medicine

## 2015-08-07 ENCOUNTER — Ambulatory Visit (INDEPENDENT_AMBULATORY_CARE_PROVIDER_SITE_OTHER): Payer: Medicare Other | Admitting: Internal Medicine

## 2015-08-07 VITALS — BP 132/78 | HR 63 | Ht 62.0 in | Wt 96.5 lb

## 2015-08-07 DIAGNOSIS — I10 Essential (primary) hypertension: Secondary | ICD-10-CM

## 2015-08-07 DIAGNOSIS — I2510 Atherosclerotic heart disease of native coronary artery without angina pectoris: Secondary | ICD-10-CM

## 2015-08-07 NOTE — Telephone Encounter (Signed)
Patient is approaching deadline for dmv forms to be due.  Please call patient to let her know when we think they will be ready and if she should try to get an extension from dmv.

## 2015-08-07 NOTE — Telephone Encounter (Signed)
I left a message for the patient to call. She left the office before I could get in her room with her form. I advised her this is completed and to call back and let me know if she would like to pick this up.

## 2015-08-07 NOTE — Telephone Encounter (Signed)
I left a message for the patient to call. 

## 2015-08-08 ENCOUNTER — Telehealth: Payer: Self-pay | Admitting: Internal Medicine

## 2015-08-08 NOTE — Telephone Encounter (Signed)
[facility] calling stating pt is in need of Diovan 320 mg  They sent in a PA and we did it but it was denied now we are working on other options.  Please advise.

## 2015-08-08 NOTE — Telephone Encounter (Signed)
Please advise pt has been denied.

## 2015-08-09 NOTE — Telephone Encounter (Signed)
The patient is aware this has been denied. She states she knew this, but that [facility] was supposed to have faxed information to [place] yesterday on alternatives. I advised her I would look for this and review with Dr. Caryl Comes, then call her back.

## 2015-08-09 NOTE — Telephone Encounter (Signed)
The patient is aware that her paperwork is ready for pickup.

## 2015-08-13 NOTE — Telephone Encounter (Signed)
[place]  Have either one of ya'll seen a fax from [place] on this patient?

## 2015-08-13 NOTE — Telephone Encounter (Signed)
Yes, Papers that were faxed from [facility] as well as PA denial are in Dr. Sara Chu.

## 2015-08-23 MED ORDER — LOSARTAN POTASSIUM 100 MG PO TABS: 100.0000 mg | ORAL_TABLET | Freq: Every day | ORAL | Status: DC

## 2015-08-23 NOTE — Telephone Encounter (Signed)
Dr. Caryl Comes reviewed the preferred drug list that was sent from [place] for [facility]. Form states that:  "Brand Diovan is denied because it is not a covered drug. You need to first try all of the following covered medications: - Irbesartan - losartan - valsartan"  Per Dr. Caryl Comes- ok to try losartan 100 mg once daily. I have left a message for the patient to call.

## 2015-08-23 NOTE — Telephone Encounter (Signed)
I spoke with the patient.  She is aware of Dr. Olin Pia recommendations to switch from Diovan (brand) to losartan. She is willing to try this. She just had her Diovan refilled, so she is good for the next month. I advised her I will send in her new RX to [place] and ask that they put this on hold until she calls for this. I have also advised her when she starts the new medication, to check her BP about twice daily and no more unless she is feeling bad. She is agreeable.

## 2016-03-13 NOTE — Progress Notes (Signed)
This encounter was created in error - please disregard.

## 2016-06-03 ENCOUNTER — Other Ambulatory Visit: Payer: Self-pay | Admitting: Physician Assistant

## 2016-06-03 DIAGNOSIS — R1011 Right upper quadrant pain: Secondary | ICD-10-CM

## 2016-06-05 ENCOUNTER — Ambulatory Visit
Admission: RE | Admit: 2016-06-05 | Discharge: 2016-06-05 | Disposition: A | Discharge status: Home / Self Care | Payer: Medicare Other | Source: Ambulatory Visit | Attending: Physician Assistant | Admitting: Physician Assistant

## 2016-06-05 DIAGNOSIS — K8020 Calculus of gallbladder without cholecystitis without obstruction: Secondary | ICD-10-CM | POA: Insufficient documentation

## 2016-06-05 DIAGNOSIS — R1011 Right upper quadrant pain: Secondary | ICD-10-CM | POA: Insufficient documentation

## 2016-06-30 NOTE — Progress Notes (Signed)
[place]  [phone] [phone]  ID: Christine [name] OB: 05/09/1946  MR#: [id number]  UXL#:[id number]  Patient Care Team: Glendon Axe, MD as PCP - General (Internal Medicine)  CHIEF COMPLAINT: Clinical stage IIIB ER/PR positive, HER-2 negative invasive carcinoma of the upper inner quadrant of the right breast.  INTERVAL HISTORY: Patient is a 71 year old female who was initially diagnosed with breast cancer in 2012. She was subsequently lost to follow-up, but then returned to clinic in 2013 to begin neoadjuvant chemotherapy using Taxotere and Cytoxan. Patient states she received one infusion of chemotherapy. After her first treatment, she had severe lower extremity cellulitis which required vascular surgery intervention and IV antibiotics for greater than one month. She did not receive any further chemotherapy after that point and was essentially lost to follow-up. She returns to clinic for further evaluation and consideration of treatment. She is anxious, but otherwise feels well. She has no neurologic complaints. She denies any recent fevers or illnesses. She has a good appetite and denies weight loss. She has some breast tenderness, but denies any chest pain or shortness of breath. She has no nausea, vomiting, constipation, or diarrhea. She has no urinary complaints. Patient otherwise feels well and offers no further specific complaints.  REVIEW OF SYSTEMS:   Review of Systems  Constitutional: Negative.  Negative for fever, malaise/fatigue and weight loss.  Respiratory: Negative.  Negative for cough and shortness of breath.   Cardiovascular: Negative.  Negative for chest pain and leg swelling.  Gastrointestinal: Negative.  Negative for abdominal pain.  Genitourinary: Negative.   Musculoskeletal: Negative.   Neurological: Negative.  Negative for weakness.  Psychiatric/Behavioral: The patient is nervous/anxious.     As per HPI. Otherwise, a  complete review of systems is negative.  PAST MEDICAL HISTORY: Past Medical History:  Diagnosis Date  . Allergic rhinitis   . Anxiety   . Breast cancer ([place])   . Chronic systolic heart failure ([place])   . Coronary artery disease   . GERD (gastroesophageal reflux disease)   . Heart murmur   . Hyperlipidemia   . Hypertension   . Implantable defibrillator St Jude    DOI 2006  . MI (myocardial infarction)   . Osteoarthritis     PAST SURGICAL HISTORY: Past Surgical History:  Procedure Laterality Date  . CHF exacerbation  10/07  . CORONARY STENT PLACEMENT  8/06   3 stents; myocardial infarction  . NM MYOVIEW LTD  1/08   EF 39%, no sx ischemia  . PACEMAKER PLACEMENT  2/07   Defibrillator    FAMILY HISTORY: Family History  Problem Relation Age of Onset  . Stroke Mother 54  . Heart attack Father   . Cancer Neg Hx     No breast or colon cancer  . Heart attack Brother   . Coronary artery disease Other   . Hypertension Other   . Coronary artery disease Father     ADVANCED DIRECTIVES (Y/N):  N  HEALTH MAINTENANCE: Social History  Substance Use Topics  . Smoking status: Current Every Day Smoker    Packs/day: 0.50  . Smokeless tobacco: Not on file  . Alcohol use No     Comment: 2-3 times per month     Colonoscopy:  PAP:  Bone density:  Lipid panel:  Allergies  Allergen Reactions  . Atorvastatin     REACTION: increased LFT's  . Lisinopril     REACTION: cough  . Oxycodone Hcl     REACTION: unspecified  .  Simvastatin     REACTION: myalgias    Current Outpatient Prescriptions  Medication Sig Dispense Refill  . aspirin 81 MG tablet Take 81 mg by mouth daily.    . bisacodyl (DULCOLAX) 5 MG EC tablet Take 5 mg by mouth daily as needed for mild constipation or moderate constipation.    . carvedilol (COREG) 25 MG tablet TAKE 1 TABLET BY MOUTH TWICE A DAY 60 tablet 2  . Cholecalciferol (VITAMIN D) 2000 UNITS tablet Take 2,000 Units by mouth daily.    Marland Kitchen LORazepam  (ATIVAN) 1 MG tablet Take 1 mg by mouth daily as needed for anxiety.    . Multiple Vitamin (MULTIVITAMIN) tablet Take 1 tablet by mouth daily.    Marland Kitchen omeprazole (PRILOSEC) 20 MG capsule Take 20 mg by mouth 2 (two) times daily.      . rosuvastatin (CRESTOR) 10 MG tablet Take 10 mg by mouth at bedtime.      . traMADol (ULTRAM) 50 MG tablet Take 50 mg by mouth every 6 (six) hours as needed for moderate pain.     . valsartan (DIOVAN) 320 MG tablet Take 320 mg by mouth daily.     No current facility-administered medications for this visit.     OBJECTIVE: Vitals:   07/01/16 1053  BP: (!) 149/85  Pulse: 71  Resp: 18  Temp: 97.7 F (36.5 C)     Body mass index is 17.16 kg/m.    ECOG FS:0 - Asymptomatic  General: Well-developed, well-nourished, no acute distress. Eyes: Pink conjunctiva, anicteric sclera. HEENT: Normocephalic, moist mucous membranes, clear oropharnyx. Breasts: Approximately 2-3 cm mass in the upper inner quadrant with skin ulceration. Easily palpable 1 cm axillary lymph node. Lungs: Clear to auscultation bilaterally. Heart: Regular rate and rhythm. No rubs, murmurs, or gallops. Abdomen: Soft, nontender, nondistended. No organomegaly noted, normoactive bowel sounds. Musculoskeletal: No edema, cyanosis, or clubbing. Neuro: Alert, answering all questions appropriately. Cranial nerves grossly intact. Skin: No rashes or petechiae noted. Psych: Normal affect.   LAB RESULTS:  Lab Results  Component Value Date   NA 138 12/20/2014   K 2.7 (LL) 12/20/2014   CL 102 12/20/2014   CO2 28 12/20/2014   GLUCOSE 102 (H) 12/20/2014   BUN 8 12/20/2014   CREATININE 1.02 (H) 12/20/2014   CALCIUM 8.3 (L) 12/20/2014   PROT 6.7 12/20/2014   ALBUMIN 3.6 12/20/2014   AST 18 12/20/2014   ALT 8 (L) 12/20/2014   ALKPHOS 88 12/20/2014   BILITOT 0.6 12/20/2014   GFRNONAA 56 (L) 12/20/2014   GFRAA >60 12/20/2014    Lab Results  Component Value Date   WBC 6.0 12/20/2014   NEUTROABS 3.9  12/20/2014   HGB 11.0 (L) 12/20/2014   HCT 33.8 (L) 12/20/2014   MCV 89.3 12/20/2014   PLT 158 12/20/2014     STUDIES: US Abdomen Limited Ruq  Result Date: 06/05/2016 CLINICAL DATA:  Right upper quadrant pain. EXAM: US ABDOMEN LIMITED - RIGHT UPPER QUADRANT COMPARISON:  None. FINDINGS: Gallbladder: Multiple gallstones measuring up 6.8 mm. A focal area of mild gallbladder wall thickening to 2.6 mm noted. Cholecystitis cannot be completely excluded . No pericholecystic fluid collection. Negative Murphy sign. Common bile duct: Diameter: 1.3 mm Liver: No focal lesion identified. Within normal limits in parenchymal echogenicity. IMPRESSION: Multiple small gallstones. A a focal area of mild gallbladder wall thickening to 2.6 mm noted. Cholecystitis cannot be completely excluded. Electronically Signed   By: Marcello Moores  Register   On: 06/05/2016 10:48  ASSESSMENT: Clinical stage IIIB ER/PR positive, HER-2 negative invasive carcinoma of the upper inner quadrant of the right breast.  PLAN:    1. Clinical stage IIIB ER/PR positive, HER-2 negative invasive carcinoma of the upper inner quadrant of the right breast: Patient's initial diagnosis was at least in September 2012. She received one dose of chemotherapy using Taxotere and Cytoxan in 2013. She did not receive any XRT or hormonal treatment. After lengthy discussion with the patient, given the ulceration of the skin and the length of time since her diagnosis have agreed that she pursue mastectomy with axillary node sampling as initial treatment. We will get a PET scan in the next week to assess if there is any further metastatic disease. Patient will likely require adjuvant chemotherapy, but will determine this after she has her mastectomy. Patient already has a port in place from her previous attempt at treatment. At the conclusion of all her treatments, patient will benefit from an aromatase inhibitor for a minimum of 5 years. Return to clinic one week  after her mastectomy to discuss the results and treatment planning.  Approximately 45 minutes was spent in discussion of which greater than 50% was consultation.  Patient expressed understanding and was in agreement with this plan. She also understands that She can call clinic at any time with any questions, concerns, or complaints.   Breast cancer of upper-inner quadrant of right female breast ([place])   Staging form: Breast, AJCC 8th Edition   - Clinical: Stage IIIB (cT4b, cN1, cM0, G2, ER: Positive, PR: Positive, HER2: Negative) - Signed by Lloyd Huger, MD on 07/01/2016  Lloyd Huger, MD   07/01/2016 5:25 PM

## 2016-07-01 ENCOUNTER — Inpatient Hospital Stay: Payer: Medicare Other | Attending: Oncology | Admitting: Oncology

## 2016-07-01 VITALS — BP 149/85 | HR 71 | Temp 97.7°F | Resp 18 | Wt 93.8 lb

## 2016-07-01 DIAGNOSIS — Z9221 Personal history of antineoplastic chemotherapy: Secondary | ICD-10-CM

## 2016-07-01 DIAGNOSIS — C50212 Malignant neoplasm of upper-inner quadrant of left female breast: Secondary | ICD-10-CM

## 2016-07-01 DIAGNOSIS — Z7982 Long term (current) use of aspirin: Secondary | ICD-10-CM | POA: Diagnosis not present

## 2016-07-01 DIAGNOSIS — C50211 Malignant neoplasm of upper-inner quadrant of right female breast: Secondary | ICD-10-CM | POA: Diagnosis present

## 2016-07-01 DIAGNOSIS — I2510 Atherosclerotic heart disease of native coronary artery without angina pectoris: Secondary | ICD-10-CM

## 2016-07-01 DIAGNOSIS — K5730 Diverticulosis of large intestine without perforation or abscess without bleeding: Secondary | ICD-10-CM | POA: Diagnosis not present

## 2016-07-01 DIAGNOSIS — K8020 Calculus of gallbladder without cholecystitis without obstruction: Secondary | ICD-10-CM

## 2016-07-01 DIAGNOSIS — E785 Hyperlipidemia, unspecified: Secondary | ICD-10-CM

## 2016-07-01 DIAGNOSIS — R011 Cardiac murmur, unspecified: Secondary | ICD-10-CM | POA: Diagnosis not present

## 2016-07-01 DIAGNOSIS — F419 Anxiety disorder, unspecified: Secondary | ICD-10-CM

## 2016-07-01 DIAGNOSIS — Z170 Estrogen receptor positive status [ER+]: Secondary | ICD-10-CM | POA: Diagnosis not present

## 2016-07-01 DIAGNOSIS — I252 Old myocardial infarction: Secondary | ICD-10-CM | POA: Diagnosis not present

## 2016-07-01 DIAGNOSIS — R918 Other nonspecific abnormal finding of lung field: Secondary | ICD-10-CM | POA: Diagnosis not present

## 2016-07-01 DIAGNOSIS — Z79899 Other long term (current) drug therapy: Secondary | ICD-10-CM | POA: Diagnosis not present

## 2016-07-01 DIAGNOSIS — F17210 Nicotine dependence, cigarettes, uncomplicated: Secondary | ICD-10-CM

## 2016-07-01 DIAGNOSIS — M1990 Unspecified osteoarthritis, unspecified site: Secondary | ICD-10-CM

## 2016-07-01 DIAGNOSIS — I10 Essential (primary) hypertension: Secondary | ICD-10-CM | POA: Diagnosis not present

## 2016-07-01 DIAGNOSIS — I5022 Chronic systolic (congestive) heart failure: Secondary | ICD-10-CM | POA: Diagnosis not present

## 2016-07-01 DIAGNOSIS — K219 Gastro-esophageal reflux disease without esophagitis: Secondary | ICD-10-CM

## 2016-07-01 NOTE — Progress Notes (Signed)
New evaluation for breast cancer that was originally diagnosed in 2012. Complains of pain in left breast.

## 2016-07-01 NOTE — Progress Notes (Signed)
  Oncology Nurse Navigator Documentation  Navigator Location: [facility] (07/01/16 1600) Referral date to RadOnc/MedOnc: 07/01/16 (07/01/16 1600) )Navigator Encounter Type: Treatment (07/01/16 1600)                     Patient Visit Type: MedOnc;Initial (07/01/16 1600)                              Time Spent with Patient: 90 (07/01/16 1600)   Accompanied patient to initial consult with Dr. Grayland Ormond.  Patient originally diagnosed with invasive ductal carcinoma of left breast in 2012.  She saw Dr Loistine Simas, then she saw Dr. Ma Hillock in January 2013, received  1 cycle of chemotherapy, and did not return for further treatment.  Plans for mastectomy . Patient to call when surgery scheduled.

## 2016-07-08 ENCOUNTER — Ambulatory Visit
Admission: RE | Admit: 2016-07-08 | Discharge: 2016-07-08 | Disposition: A | Discharge status: Home / Self Care | Payer: Medicare Other | Source: Ambulatory Visit | Attending: Oncology | Admitting: Oncology

## 2016-07-08 DIAGNOSIS — I2510 Atherosclerotic heart disease of native coronary artery without angina pectoris: Secondary | ICD-10-CM | POA: Diagnosis not present

## 2016-07-08 DIAGNOSIS — R918 Other nonspecific abnormal finding of lung field: Secondary | ICD-10-CM | POA: Diagnosis not present

## 2016-07-08 DIAGNOSIS — K8020 Calculus of gallbladder without cholecystitis without obstruction: Secondary | ICD-10-CM | POA: Insufficient documentation

## 2016-07-08 DIAGNOSIS — K5730 Diverticulosis of large intestine without perforation or abscess without bleeding: Secondary | ICD-10-CM | POA: Insufficient documentation

## 2016-07-08 DIAGNOSIS — C50212 Malignant neoplasm of upper-inner quadrant of left female breast: Secondary | ICD-10-CM | POA: Insufficient documentation

## 2016-07-08 DIAGNOSIS — M4316 Spondylolisthesis, lumbar region: Secondary | ICD-10-CM | POA: Insufficient documentation

## 2016-07-08 DIAGNOSIS — M47816 Spondylosis without myelopathy or radiculopathy, lumbar region: Secondary | ICD-10-CM | POA: Insufficient documentation

## 2016-07-08 DIAGNOSIS — I517 Cardiomegaly: Secondary | ICD-10-CM | POA: Diagnosis not present

## 2016-07-08 DIAGNOSIS — Z170 Estrogen receptor positive status [ER+]: Secondary | ICD-10-CM | POA: Insufficient documentation

## 2016-07-08 DIAGNOSIS — I700 Atherosclerosis of aorta: Secondary | ICD-10-CM | POA: Diagnosis not present

## 2016-07-08 LAB — GLUCOSE, CAPILLARY: GLUCOSE-CAPILLARY: 89 mg/dL (ref 65–99)

## 2016-07-08 MED ORDER — FLUDEOXYGLUCOSE F - 18 (FDG) INJECTION
12.0000 | Freq: Once | INTRAVENOUS | Status: AC | PRN
  Administered 2016-07-08: 12.14 via INTRAVENOUS

## 2016-07-09 ENCOUNTER — Telehealth: Payer: Self-pay | Admitting: Internal Medicine

## 2016-07-09 NOTE — Telephone Encounter (Signed)
Called pt back and informed her that I would send this information over to Dr. Caryl Comes and Nira Conn his nurse and for pt to keep apt with Dr. Caryl Comes on 07/15/2016. Pt voiced understanding.

## 2016-07-09 NOTE — Telephone Encounter (Signed)
Pt states she is having a mastectomy and states the at tumor in her breast is interfering with her defibrillator, and is causing her a lot of pain. Pt states her surgeon, Dr. Rochel Brome [phone] is demanding her defibrillator be removed. Please call. Pt states Dr. Caryl Comes may need to speak with Dr. Tamala Julian.

## 2016-07-10 NOTE — Telephone Encounter (Signed)
Dr. Klein aware 

## 2016-07-11 ENCOUNTER — Telehealth: Payer: Self-pay | Admitting: *Deleted

## 2016-07-11 NOTE — Telephone Encounter (Signed)
Will call him this afternoon when I get back to [place].

## 2016-07-11 NOTE — Telephone Encounter (Signed)
Asking that Dr Grayland Ormond call him with the patients plan of Treatment

## 2016-07-14 NOTE — Progress Notes (Signed)
[facility]  [phone] [phone]  ID: Christine [name] OB: 24-Jun-1945  MR#: [id number]  ATF#:[id number]  Patient Care Team: Glendon Axe, MD as PCP - General (Internal Medicine)  CHIEF COMPLAINT: Clinical stage IIIB ER/PR positive, HER-2 negative invasive carcinoma of the upper inner quadrant of the right breast.  INTERVAL HISTORY: Patient returns to clinic today for further evaluation, discussion of her PET scan results, and treatment planning. She is anxious, but otherwise feels well. The lesion on her breast continues to drain serosanguineous fluid. She has no neurologic complaints. She denies any recent fevers or illnesses. She has a good appetite and denies weight loss. She has some breast tenderness, but denies any chest pain or shortness of breath. She has no nausea, vomiting, constipation, or diarrhea. She has no urinary complaints. Patient otherwise feels well and offers no further specific complaints.  REVIEW OF SYSTEMS:   Review of Systems  Constitutional: Negative.  Negative for fever, malaise/fatigue and weight loss.  Respiratory: Negative.  Negative for cough and shortness of breath.   Cardiovascular: Negative.  Negative for chest pain and leg swelling.  Gastrointestinal: Negative.  Negative for abdominal pain.  Genitourinary: Negative.   Musculoskeletal: Negative.   Neurological: Negative.  Negative for weakness.  Psychiatric/Behavioral: The patient is nervous/anxious.     As per HPI. Otherwise, a complete review of systems is negative.  PAST MEDICAL HISTORY: Past Medical History:  Diagnosis Date  . Allergic rhinitis   . Anxiety   . Breast cancer ([place])   . Chronic systolic heart failure ([place])   . Coronary artery disease   . GERD (gastroesophageal reflux disease)   . Heart murmur   . Hyperlipidemia   . Hypertension   . Implantable defibrillator St Jude    DOI 2006  . MI (myocardial infarction)   . Osteoarthritis      PAST SURGICAL HISTORY: Past Surgical History:  Procedure Laterality Date  . CHF exacerbation  10/07  . CORONARY STENT PLACEMENT  8/06   3 stents; myocardial infarction  . NM MYOVIEW LTD  1/08   EF 39%, no sx ischemia  . PACEMAKER PLACEMENT  2/07   Defibrillator    FAMILY HISTORY: Family History  Problem Relation Age of Onset  . Stroke Mother 42  . Heart attack Father   . Coronary artery disease Father   . Heart attack Brother   . Coronary artery disease Other   . Hypertension Other   . Cancer Neg Hx     No breast or colon cancer    ADVANCED DIRECTIVES (Y/N):  N  HEALTH MAINTENANCE: Social History  Substance Use Topics  . Smoking status: Current Every Day Smoker    Packs/day: 0.50  . Smokeless tobacco: Never Used     Comment: also uses E-cig  . Alcohol use No     Comment: 2-3 times per month     Colonoscopy:  PAP:  Bone density:  Lipid panel:  Allergies  Allergen Reactions  . Atorvastatin     REACTION: increased LFT's  . Lisinopril     REACTION: cough  . Oxycodone Hcl     REACTION: unspecified  . Simvastatin     REACTION: myalgias    Current Outpatient Prescriptions  Medication Sig Dispense Refill  . aspirin 81 MG tablet Take 81 mg by mouth daily.    . bisacodyl (DULCOLAX) 5 MG EC tablet Take 5 mg by mouth daily as needed for mild constipation or moderate constipation.    Marland Kitchen  carvedilol (COREG) 25 MG tablet TAKE 1 TABLET BY MOUTH TWICE A DAY 60 tablet 2  . Cholecalciferol (VITAMIN D) 2000 UNITS tablet Take 2,000 Units by mouth daily.    Marland Kitchen LORazepam (ATIVAN) 1 MG tablet Take 1 mg by mouth daily as needed for anxiety.    . Multiple Vitamin (MULTIVITAMIN) tablet Take 1 tablet by mouth daily.    Marland Kitchen omeprazole (PRILOSEC) 20 MG capsule Take 20 mg by mouth 2 (two) times daily.      . rosuvastatin (CRESTOR) 10 MG tablet Take 10 mg by mouth at bedtime.      . traMADol (ULTRAM) 50 MG tablet Take 50 mg by mouth every 6 (six) hours as needed for moderate pain.      . valsartan (DIOVAN) 320 MG tablet Take 320 mg by mouth daily.     No current facility-administered medications for this visit.     OBJECTIVE: Vitals:   07/16/16 1006  BP: (!) 155/74  Pulse: 69  Resp: 18  Temp: 97.2 F (36.2 C)     Body mass index is 17.42 kg/m.    ECOG FS:0 - Asymptomatic  General: Well-developed, well-nourished, no acute distress. Eyes: Pink conjunctiva, anicteric sclera. HEENT: Normocephalic, moist mucous membranes, clear oropharnyx. Breasts: Approximately 2-3 cm mass in the upper inner quadrant with skin ulceration. Easily palpable 1 cm axillary lymph node. Lungs: Clear to auscultation bilaterally. Heart: Regular rate and rhythm. No rubs, murmurs, or gallops. Abdomen: Soft, nontender, nondistended. No organomegaly noted, normoactive bowel sounds. Musculoskeletal: No edema, cyanosis, or clubbing. Neuro: Alert, answering all questions appropriately. Cranial nerves grossly intact. Skin: No rashes or petechiae noted. Psych: Normal affect.   LAB RESULTS:  Lab Results  Component Value Date   NA 138 12/20/2014   K 2.7 (LL) 12/20/2014   CL 102 12/20/2014   CO2 28 12/20/2014   GLUCOSE 102 (H) 12/20/2014   BUN 8 12/20/2014   CREATININE 1.02 (H) 12/20/2014   CALCIUM 8.3 (L) 12/20/2014   PROT 6.7 12/20/2014   ALBUMIN 3.6 12/20/2014   AST 18 12/20/2014   ALT 8 (L) 12/20/2014   ALKPHOS 88 12/20/2014   BILITOT 0.6 12/20/2014   GFRNONAA 56 (L) 12/20/2014   GFRAA >60 12/20/2014    Lab Results  Component Value Date   WBC 6.0 12/20/2014   NEUTROABS 3.9 12/20/2014   HGB 11.0 (L) 12/20/2014   HCT 33.8 (L) 12/20/2014   MCV 89.3 12/20/2014   PLT 158 12/20/2014     STUDIES: Nm Pet Image Restag (ps) Skull Base To Thigh  Result Date: 07/08/2016 CLINICAL DATA:  Subsequent treatment strategy for left upper inner quadrant breast cancer. EXAM: NUCLEAR MEDICINE PET SKULL BASE TO THIGH TECHNIQUE: 12.1 mCi F-18 FDG was injected intravenously. Full-ring PET  imaging was performed from the skull base to thigh after the radiotracer. CT data was obtained and used for attenuation correction and anatomic localization. FASTING BLOOD GLUCOSE:  Value: 89 mg/dl COMPARISON:  Multiple exams, including 01/26/2014 PET-CT FINDINGS: NECK No hypermetabolic lymph nodes in the neck. CHEST Hypermetabolic left breast mass measures 2.9 by 2.0 cm and has a maximum standard uptake value of 9.7. Hypermetabolic left axillary lymph node measures 1.0 cm in short axis and has a maximum standard uptake value of 10.0. Superior segment left lower lobe pulmonary nodule measures 6 mm in diameter and has a maximum standard uptake value of 3.3. A subpleural lymph node in the left lower lobe along the major fissure only measures about 3 mm in thickness but  appears faintly hypermetabolic with a maximum SUV of 1.2. A 4 mm nodule medially in the right upper lobe on image 63 of series 4 of the CT data is perceptibly hypermetabolic with maximum SUV 1.2. Cardiomegaly. Coronary, aortic arch, and branch vessel atherosclerotic vascular disease. AICD/pacer. ABDOMEN/PELVIS Cholelithiasis.  Aortoiliac atherosclerotic vascular disease. Sigmoid diverticulosis. No significant abnormal hypermetabolic activity in the abdomen or pelvis. SKELETON Anterolisthesis due to pars defects in the lower lumbar spine. No compelling findings of osseous metastatic disease. IMPRESSION: 1. Hypermetabolic left breast mass with hypermetabolic left adenopathy. 2. There several small pulmonary nodules which are probably due to malignancy given the hypermetabolic activity. The 6 mm superior segment left lower lobe nodule has a maximum SUV of 3.3. An adjacent subpleural lymph node in the left lower lobe is mildly hypermetabolic given its very small size, and a 4 mm right upper lobe nodule is mildly hypermetabolic given its very small size. 3. Other imaging findings of potential clinical significance: Coronary, aortic arch, and branch vessel  atherosclerotic vascular disease. Aortoiliac atherosclerotic vascular disease. Cholelithiasis. Sigmoid diverticulosis. Pars defects at L4 with anterolisthesis at L4-5. Partially sacralized L5. Cardiomegaly. Electronically Signed   By: Van Clines M.D.   On: 07/08/2016 10:38    ASSESSMENT: Clinical stage IIIB ER/PR positive, HER-2 negative invasive carcinoma of the upper inner quadrant of the right breast.  PLAN:    1. Clinical stage IIIB ER/PR positive, HER-2 negative invasive carcinoma of the upper inner quadrant of the right breast: Patient's initial diagnosis was at least in September 2012. She received one dose of chemotherapy using Taxotere and Cytoxan in 2013. She did not receive any XRT or hormonal treatment. PET scan results from July 08, 2016 reviewed independently and reported as above possibly suggesting metastatic lesions in her lung, but these are too small to characterize. Case discussed with surgery and it was agreed upon to use neoadjuvant chemotherapy to shrink her lesion and possibly make mastectomy easier. Goal will be to shrink tumor away from her defibrillator. Patient already has a port in place from her previous attempt at treatment. At the conclusion of all her treatments, patient will benefit from an aromatase inhibitor for a minimum of 5 years. Patient wishes to delay treatment 2 weeks, therefore she will return to clinic on July 30, 2016 to initiate cycle 1 of 4 of Taxotere and Cytoxan. Patient will also receive OnPro Neulasta with each cycle. 2. Defibrillator: Appreciate cardiology input. Will coordinate removal and/or replacement of defibrillator at time of mastectomy.  Approximately 30 minutes was spent in discussion of which greater than 50% was consultation.  Patient expressed understanding and was in agreement with this plan. She also understands that She can call clinic at any time with any questions, concerns, or complaints.   Cancer Staging Breast  cancer of upper-inner quadrant of right female breast [facility]) Staging form: Breast, AJCC 8th Edition - Clinical: Stage IIIB (cT4b, cN1, cM0, G2, ER: Positive, PR: Positive, HER2: Negative) - Signed by Lloyd Huger, MD on 07/01/2016   Lloyd Huger, MD   07/18/2016 5:15 PM

## 2016-07-15 ENCOUNTER — Encounter: Payer: Self-pay | Admitting: Internal Medicine

## 2016-07-15 ENCOUNTER — Ambulatory Visit (INDEPENDENT_AMBULATORY_CARE_PROVIDER_SITE_OTHER): Payer: Medicare Other | Admitting: Internal Medicine

## 2016-07-15 VITALS — BP 152/82 | HR 65 | Ht 62.0 in | Wt 94.0 lb

## 2016-07-15 DIAGNOSIS — I10 Essential (primary) hypertension: Secondary | ICD-10-CM | POA: Diagnosis not present

## 2016-07-15 DIAGNOSIS — I255 Ischemic cardiomyopathy: Secondary | ICD-10-CM

## 2016-07-15 DIAGNOSIS — I2589 Other forms of chronic ischemic heart disease: Secondary | ICD-10-CM

## 2016-07-15 DIAGNOSIS — T82190D Other mechanical complication of cardiac electrode, subsequent encounter: Secondary | ICD-10-CM | POA: Diagnosis not present

## 2016-07-15 NOTE — Patient Instructions (Signed)

## 2016-07-15 NOTE — Progress Notes (Signed)
Patient Care Team: Glendon Axe, MD as PCP - General (Internal Medicine)   HPI  Christine [name] is a 71 y.o. female Seen in followup  with anticipation of generator replacement of her previously implanted and now at [place].    She has a history of breast cancer and had been discussions ongoing for several months as to what to do about her cancer surgery and possible repositioning of her defibrillator.  She is back again today because of considerations for device explantation related to possible mastectomy .  Intercurrently she has seen oncology. She began having drainage from the firm breast mass that has been there now for some years. She is to see oncology morning having recently had a PET scan  Echocardiogram 2012 demonstrated impaired LV function, i.e. 25-30% with apical thrombus.   Echocardiogram 9/15 demonstrated ejection fraction of 30-35% with a mural thrombus. No stalk was described. Myoview scan demonstrated similar ejection fraction with inferobasilar ischemia and a large infarct.   She's having no angina. Dyspnea is stable. Unfortunately she continues to smoke.   She is able to do her daily activities. She denies chest pain or significant shortness of breath.  Past Medical History:  Diagnosis Date  . Allergic rhinitis   . Anxiety   . Breast cancer ([place])   . Chronic systolic heart failure ([place])   . Coronary artery disease   . GERD (gastroesophageal reflux disease)   . Heart murmur   . Hyperlipidemia   . Hypertension   . Implantable defibrillator St Jude    DOI 2006  . MI (myocardial infarction)   . Osteoarthritis     Past Surgical History:  Procedure Laterality Date  . CHF exacerbation  10/07  . CORONARY STENT PLACEMENT  8/06   3 stents; myocardial infarction  . NM MYOVIEW LTD  1/08   EF 39%, no sx ischemia  . PACEMAKER PLACEMENT  2/07   Defibrillator    Current Outpatient Prescriptions  Medication Sig Dispense Refill  . aspirin 81 MG tablet Take  81 mg by mouth daily.    . bisacodyl (DULCOLAX) 5 MG EC tablet Take 5 mg by mouth daily as needed for mild constipation or moderate constipation.    . carvedilol (COREG) 25 MG tablet TAKE 1 TABLET BY MOUTH TWICE A DAY 60 tablet 2  . Cholecalciferol (VITAMIN D) 2000 UNITS tablet Take 2,000 Units by mouth daily.    Marland Kitchen LORazepam (ATIVAN) 1 MG tablet Take 1 mg by mouth daily as needed for anxiety.    . Multiple Vitamin (MULTIVITAMIN) tablet Take 1 tablet by mouth daily.    Marland Kitchen omeprazole (PRILOSEC) 20 MG capsule Take 20 mg by mouth 2 (two) times daily.      . rosuvastatin (CRESTOR) 10 MG tablet Take 10 mg by mouth at bedtime.      . traMADol (ULTRAM) 50 MG tablet Take 50 mg by mouth every 6 (six) hours as needed for moderate pain.     . valsartan (DIOVAN) 320 MG tablet Take 320 mg by mouth daily.     No current facility-administered medications for this visit.     Allergies  Allergen Reactions  . Atorvastatin     REACTION: increased LFT's  . Lisinopril     REACTION: cough  . Oxycodone Hcl     REACTION: unspecified  . Simvastatin     REACTION: myalgias    Review of Systems negative except from HPI and PMH  Physical Exam BP Marland Kitchen)  152/82 (BP Location: Left Arm, Patient Position: Sitting, Cuff Size: Normal)   Pulse 65   Ht 5\' 2"  (1.575 m)   Wt 94 lb (42.6 kg)   BMI 17.19 kg/m  Well developed and well nourished in no acute distress HENT normal E scleral and icterus clear Neck Supple JVP flat; carotids brisk and full Clear to ausculation Device pocket well healed; without hematoma or erythema.  There is no tethering There is a firm mass with erosion just caudal to the medial caudal aspect of the device. There seems to be interval normal tissue between the 2. Port-A-Cath on the right side    Regular rate and rhythm, no murmurs gallops or rub Soft with active bowel sounds No clubbing cyanosis  Edema Alert and oriented, grossly normal motor and sensory function Skin Warm and Dry  ECG  demonstrates sinus rhythm at 63 Intervals 16/09/46 Occasional atrial paced event  Assessment and  Plan  Ischemic cardiomyopathy  Breast cancer  Implantable defibrillator at EOS Dual chamber   Tobacco abuse  Hypertension  Without symptoms of ischemia  BP elevated today.  The issue that needs to be addressed is the plan for her breast mass, its excision, and the role of removing the defibrillator if necessary.  I will be in touch with oncology after they see her in that I can coordinate with Dr. Tamala Julian whatever needs to get done     We spent more than 50% of our >25 min visit in face to face counseling regarding the above

## 2016-07-16 ENCOUNTER — Inpatient Hospital Stay (HOSPITAL_BASED_OUTPATIENT_CLINIC_OR_DEPARTMENT_OTHER): Payer: Medicare Other | Admitting: Oncology

## 2016-07-16 ENCOUNTER — Telehealth: Payer: Self-pay

## 2016-07-16 VITALS — BP 155/74 | HR 69 | Temp 97.2°F | Resp 18 | Wt 95.2 lb

## 2016-07-16 DIAGNOSIS — K5730 Diverticulosis of large intestine without perforation or abscess without bleeding: Secondary | ICD-10-CM | POA: Diagnosis not present

## 2016-07-16 DIAGNOSIS — I10 Essential (primary) hypertension: Secondary | ICD-10-CM | POA: Diagnosis not present

## 2016-07-16 DIAGNOSIS — I5022 Chronic systolic (congestive) heart failure: Secondary | ICD-10-CM

## 2016-07-16 DIAGNOSIS — Z79899 Other long term (current) drug therapy: Secondary | ICD-10-CM

## 2016-07-16 DIAGNOSIS — M1990 Unspecified osteoarthritis, unspecified site: Secondary | ICD-10-CM

## 2016-07-16 DIAGNOSIS — Z170 Estrogen receptor positive status [ER+]: Secondary | ICD-10-CM | POA: Diagnosis not present

## 2016-07-16 DIAGNOSIS — F419 Anxiety disorder, unspecified: Secondary | ICD-10-CM

## 2016-07-16 DIAGNOSIS — I2510 Atherosclerotic heart disease of native coronary artery without angina pectoris: Secondary | ICD-10-CM | POA: Diagnosis not present

## 2016-07-16 DIAGNOSIS — E785 Hyperlipidemia, unspecified: Secondary | ICD-10-CM | POA: Diagnosis not present

## 2016-07-16 DIAGNOSIS — R011 Cardiac murmur, unspecified: Secondary | ICD-10-CM

## 2016-07-16 DIAGNOSIS — F17210 Nicotine dependence, cigarettes, uncomplicated: Secondary | ICD-10-CM

## 2016-07-16 DIAGNOSIS — K219 Gastro-esophageal reflux disease without esophagitis: Secondary | ICD-10-CM | POA: Diagnosis not present

## 2016-07-16 DIAGNOSIS — Z7982 Long term (current) use of aspirin: Secondary | ICD-10-CM

## 2016-07-16 DIAGNOSIS — Z9221 Personal history of antineoplastic chemotherapy: Secondary | ICD-10-CM

## 2016-07-16 DIAGNOSIS — K8020 Calculus of gallbladder without cholecystitis without obstruction: Secondary | ICD-10-CM

## 2016-07-16 DIAGNOSIS — C50211 Malignant neoplasm of upper-inner quadrant of right female breast: Secondary | ICD-10-CM

## 2016-07-16 DIAGNOSIS — I252 Old myocardial infarction: Secondary | ICD-10-CM

## 2016-07-16 DIAGNOSIS — R918 Other nonspecific abnormal finding of lung field: Secondary | ICD-10-CM

## 2016-07-16 NOTE — Progress Notes (Signed)
Patient is here for follow up, she has pain in her left breast. Patient was given a number from Dr. Virl Axe at [facility].

## 2016-07-16 NOTE — Telephone Encounter (Signed)
Patient called to leave Dr. Olin Pia personal number [phone]. But she said you and Caryl Comes has already spoken. She has also talked with the home care service she has used in the past called [facility] and they will need a referral from you. Is this something you can put in for the patient or do I need to contact their home office to get them to fax [place] a form?

## 2016-07-16 NOTE — Telephone Encounter (Signed)
We'll need a form from them to fax over.  Thanks!

## 2016-07-18 MED ORDER — PROCHLORPERAZINE MALEATE 10 MG PO TABS: 10.0000 mg | ORAL_TABLET | Freq: Four times a day (QID) | ORAL | 1 refills | Status: DC | PRN

## 2016-07-18 MED ORDER — ONDANSETRON HCL 8 MG PO TABS: 8.0000 mg | ORAL_TABLET | Freq: Two times a day (BID) | ORAL | 1 refills | Status: DC | PRN

## 2016-07-18 MED ORDER — LIDOCAINE-PRILOCAINE 2.5-2.5 % EX CREA: TOPICAL_CREAM | CUTANEOUS | 3 refills | Status: DC

## 2016-07-18 NOTE — Progress Notes (Signed)
START ON PATHWAY REGIMEN - Breast  [id number]: TC - Docetaxel, Cyclophosphamide q21 Days x 4 Cycles   A cycle is every 21 days:     Docetaxel (Taxotere(R)) 75 mg/m2 in 250 mL NS IV over 1 hour, q21 days; followed by Dose Mod: None     Cyclophosphamide (Cytoxan(R)) 600 mg/m2 in 250 mL NS IV over 30 minutes, q21 days Dose Mod: None Additional Orders: Premedicate with dexamethasone 8 mg PO bid for three days beginning 1 day prior to therapy  **Always confirm dose/schedule in your pharmacy ordering system**    Patient Characteristics: Neoadjuvant Chemotherapy, HER2/neu Negative/Unknown/Equivocal, ER Positive AJCC Stage Grouping: IIIB Current Disease Status: No Distant Mets or Local Recurrence AJCC M Stage: 0 ER Status: Positive (+) AJCC N Stage: 1 AJCC T Stage: 4b HER2/neu: Negative (-) PR Status: Positive (+)  Intent of Therapy: Curative Intent, Discussed with Patient

## 2016-07-18 NOTE — Telephone Encounter (Signed)
Patient called this morning and she will have [facility] fax over a referral form this morning

## 2016-07-29 NOTE — Progress Notes (Signed)
[place]  [phone]) [phone] [phone]) [phone]  ID: TRUC WINFREE OB: September 23, 1945  MR#: [id number]  PIR#:[id number]  Patient Care Team: Glendon Axe, MD as PCP - General (Internal Medicine)  CHIEF COMPLAINT: Clinical stage IIIB ER/PR positive, HER-2 negative invasive carcinoma of the upper inner quadrant of the right breast.  INTERVAL HISTORY: Patient returns to clinic today for further evaluation and initiation of cycle 1 of 4 of neoadjuvant Taxotere and Cytoxan. She continues to be anxious, but otherwise feels well. The lesion on her breast continues to drain serosanguineous fluid. She has no neurologic complaints. She denies any recent fevers or illnesses. She has a good appetite and denies weight loss. She has some breast tenderness, but denies any chest pain or shortness of breath. She has no nausea, vomiting, constipation, or diarrhea. She has no urinary complaints. Patient otherwise feels well and offers no further specific complaints.  REVIEW OF SYSTEMS:   Review of Systems  Constitutional: Negative.  Negative for fever, malaise/fatigue and weight loss.  Respiratory: Negative.  Negative for cough and shortness of breath.   Cardiovascular: Negative.  Negative for chest pain and leg swelling.  Gastrointestinal: Negative.  Negative for abdominal pain.  Genitourinary: Negative.   Musculoskeletal: Negative.   Neurological: Negative.  Negative for weakness.  Psychiatric/Behavioral: The patient is nervous/anxious.     As per HPI. Otherwise, a complete review of systems is negative.  PAST MEDICAL HISTORY: Past Medical History:  Diagnosis Date  . Allergic rhinitis   . Anxiety   . Breast cancer ([place])   . Chronic systolic heart failure ([place])   . Coronary artery disease   . GERD (gastroesophageal reflux disease)   . Heart murmur   . Hyperlipidemia   . Hypertension   . Implantable defibrillator St Jude    DOI 2006  . MI (myocardial infarction)   .  Osteoarthritis     PAST SURGICAL HISTORY: Past Surgical History:  Procedure Laterality Date  . CHF exacerbation  10/07  . CORONARY STENT PLACEMENT  8/06   3 stents; myocardial infarction  . NM MYOVIEW LTD  1/08   EF 39%, no sx ischemia  . PACEMAKER PLACEMENT  2/07   Defibrillator    FAMILY HISTORY: Family History  Problem Relation Age of Onset  . Stroke Mother 43  . Heart attack Father   . Coronary artery disease Father   . Heart attack Brother   . Coronary artery disease Other   . Hypertension Other   . Cancer Neg Hx     No breast or colon cancer    ADVANCED DIRECTIVES (Y/N):  N  HEALTH MAINTENANCE: Social History  Substance Use Topics  . Smoking status: Current Every Day Smoker    Packs/day: 0.50  . Smokeless tobacco: Never Used     Comment: also uses E-cig  . Alcohol use No     Comment: 2-3 times per month     Colonoscopy:  PAP:  Bone density:  Lipid panel:  Allergies  Allergen Reactions  . Atorvastatin     REACTION: increased LFT's  . Lisinopril     REACTION: cough  . Oxycodone Hcl     REACTION: unspecified  . Simvastatin     REACTION: myalgias    Current Outpatient Prescriptions  Medication Sig Dispense Refill  . aspirin 81 MG tablet Take 81 mg by mouth daily.    . bisacodyl (DULCOLAX) 5 MG EC tablet Take 5 mg by mouth daily as needed for mild constipation or  moderate constipation.    . carvedilol (COREG) 25 MG tablet TAKE 1 TABLET BY MOUTH TWICE A DAY 60 tablet 2  . Cholecalciferol (VITAMIN D) 2000 UNITS tablet Take 2,000 Units by mouth daily.    . furosemide (LASIX) 40 MG tablet Take 40 mg by mouth as needed.    . lidocaine-prilocaine (EMLA) cream Apply to affected area once 30 g 3  . LORazepam (ATIVAN) 1 MG tablet Take 1 mg by mouth daily as needed for anxiety.    . Multiple Vitamin (MULTIVITAMIN) tablet Take 1 tablet by mouth daily.    Marland Kitchen omeprazole (PRILOSEC) 20 MG capsule Take 20 mg by mouth 2 (two) times daily.      . ondansetron  (ZOFRAN) 8 MG tablet Take 1 tablet (8 mg total) by mouth 2 (two) times daily as needed for refractory nausea / vomiting. 30 tablet 1  . prochlorperazine (COMPAZINE) 10 MG tablet Take 1 tablet (10 mg total) by mouth every 6 (six) hours as needed (Nausea or vomiting). 30 tablet 1  . rosuvastatin (CRESTOR) 10 MG tablet Take 10 mg by mouth at bedtime.      . traMADol (ULTRAM) 50 MG tablet Take 50 mg by mouth every 6 (six) hours as needed for moderate pain.     . valsartan (DIOVAN) 320 MG tablet Take 320 mg by mouth daily.     No current facility-administered medications for this visit.     OBJECTIVE: Vitals:   07/30/16 0856  BP: 117/78  Pulse: 60  Temp: 97 F (36.1 C)     Body mass index is 17.14 kg/m.    ECOG FS:0 - Asymptomatic  General: Well-developed, well-nourished, no acute distress. Eyes: Pink conjunctiva, anicteric sclera. Breasts: Approximately 2-3 cm mass in the upper inner quadrant with skin ulceration. Easily palpable 1 cm axillary lymph node. Lungs: Clear to auscultation bilaterally. Heart: Regular rate and rhythm. No rubs, murmurs, or gallops. Abdomen: Soft, nontender, nondistended. No organomegaly noted, normoactive bowel sounds. Musculoskeletal: No edema, cyanosis, or clubbing. Neuro: Alert, answering all questions appropriately. Cranial nerves grossly intact. Skin: No rashes or petechiae noted. Psych: Normal affect.   LAB RESULTS:  Lab Results  Component Value Date   NA 136 07/30/2016   K 4.0 07/30/2016   CL 103 07/30/2016   CO2 27 07/30/2016   GLUCOSE 93 07/30/2016   BUN 18 07/30/2016   CREATININE 1.07 (H) 07/30/2016   CALCIUM 8.6 (L) 07/30/2016   PROT 7.0 07/30/2016   ALBUMIN 4.1 07/30/2016   AST 29 07/30/2016   ALT 16 07/30/2016   ALKPHOS 80 07/30/2016   BILITOT 0.7 07/30/2016   GFRNONAA 52 (L) 07/30/2016   GFRAA 60 (L) 07/30/2016    Lab Results  Component Value Date   WBC 6.6 07/30/2016   NEUTROABS 3.8 07/30/2016   HGB 12.9 07/30/2016   HCT  37.8 07/30/2016   MCV 87.1 07/30/2016   PLT 141 (L) 07/30/2016     STUDIES: Nm Pet Image Restag (ps) Skull Base To Thigh  Result Date: 07/08/2016 CLINICAL DATA:  Subsequent treatment strategy for left upper inner quadrant breast cancer. EXAM: NUCLEAR MEDICINE PET SKULL BASE TO THIGH TECHNIQUE: 12.1 mCi F-18 FDG was injected intravenously. Full-ring PET imaging was performed from the skull base to thigh after the radiotracer. CT data was obtained and used for attenuation correction and anatomic localization. FASTING BLOOD GLUCOSE:  Value: 89 mg/dl COMPARISON:  Multiple exams, including 01/26/2014 PET-CT FINDINGS: NECK No hypermetabolic lymph nodes in the neck. CHEST Hypermetabolic left breast  mass measures 2.9 by 2.0 cm and has a maximum standard uptake value of 9.7. Hypermetabolic left axillary lymph node measures 1.0 cm in short axis and has a maximum standard uptake value of 10.0. Superior segment left lower lobe pulmonary nodule measures 6 mm in diameter and has a maximum standard uptake value of 3.3. A subpleural lymph node in the left lower lobe along the major fissure only measures about 3 mm in thickness but appears faintly hypermetabolic with a maximum SUV of 1.2. A 4 mm nodule medially in the right upper lobe on image 63 of series 4 of the CT data is perceptibly hypermetabolic with maximum SUV 1.2. Cardiomegaly. Coronary, aortic arch, and branch vessel atherosclerotic vascular disease. AICD/pacer. ABDOMEN/PELVIS Cholelithiasis.  Aortoiliac atherosclerotic vascular disease. Sigmoid diverticulosis. No significant abnormal hypermetabolic activity in the abdomen or pelvis. SKELETON Anterolisthesis due to pars defects in the lower lumbar spine. No compelling findings of osseous metastatic disease. IMPRESSION: 1. Hypermetabolic left breast mass with hypermetabolic left adenopathy. 2. There several small pulmonary nodules which are probably due to malignancy given the hypermetabolic activity. The 6 mm  superior segment left lower lobe nodule has a maximum SUV of 3.3. An adjacent subpleural lymph node in the left lower lobe is mildly hypermetabolic given its very small size, and a 4 mm right upper lobe nodule is mildly hypermetabolic given its very small size. 3. Other imaging findings of potential clinical significance: Coronary, aortic arch, and branch vessel atherosclerotic vascular disease. Aortoiliac atherosclerotic vascular disease. Cholelithiasis. Sigmoid diverticulosis. Pars defects at L4 with anterolisthesis at L4-5. Partially sacralized L5. Cardiomegaly. Electronically Signed   By: Van Clines M.D.   On: 07/08/2016 10:38    ASSESSMENT: Clinical stage IIIB ER/PR positive, HER-2 negative invasive carcinoma of the upper inner quadrant of the right breast.  PLAN:    1. Clinical stage IIIB ER/PR positive, HER-2 negative invasive carcinoma of the upper inner quadrant of the right breast: Patient's initial diagnosis was at least in September 2012. She received one dose of chemotherapy using Taxotere and Cytoxan in 2013. She did not receive any XRT or hormonal treatment. PET scan results from July 08, 2016 reviewed independently and reported as above possibly suggesting metastatic lesions in her lung, but these are too small to characterize. Case discussed with surgery and it was agreed upon to use neoadjuvant chemotherapy to shrink her lesion and possibly make mastectomy easier. Goal will be to shrink tumor away from her defibrillator. Patient already has a port in place from her previous attempt at treatment. At the conclusion of all her treatments, patient will benefit from an aromatase inhibitor for a minimum of 5 years. Proceed with cycle 1 of 4 of Taxotere and Cytoxan. Patient will also receive OnPro Neulasta with each cycle. Return to clinic in 1 week for laboratory work and further evaluation and then in 3 weeks for consideration of cycle 2. 2. Defibrillator: [place] cardiology input.  Will coordinate removal and/or replacement of defibrillator at time of mastectomy.  Approximately 30 minutes was spent in discussion of which greater than 50% was consultation.  Patient expressed understanding and was in agreement with this plan. She also understands that She can call clinic at any time with any questions, concerns, or complaints.   Cancer Staging Breast cancer of upper-inner quadrant of right female breast [facility]) Staging form: Breast, AJCC 8th Edition - Clinical: Stage IIIB (cT4b, cN1, cM0, G2, ER: Positive, PR: Positive, HER2: Negative) - Signed by Lloyd Huger, MD on 07/01/2016  Lloyd Huger, MD   08/01/2016 1:02 PM

## 2016-07-30 ENCOUNTER — Inpatient Hospital Stay: Payer: Medicare Other | Attending: Oncology | Admitting: Oncology

## 2016-07-30 ENCOUNTER — Inpatient Hospital Stay: Payer: Medicare Other

## 2016-07-30 VITALS — BP 117/78 | HR 60 | Temp 97.0°F | Wt 93.7 lb

## 2016-07-30 VITALS — BP 114/68 | HR 60 | Resp 16

## 2016-07-30 DIAGNOSIS — Z7689 Persons encountering health services in other specified circumstances: Secondary | ICD-10-CM | POA: Insufficient documentation

## 2016-07-30 DIAGNOSIS — F17210 Nicotine dependence, cigarettes, uncomplicated: Secondary | ICD-10-CM | POA: Insufficient documentation

## 2016-07-30 DIAGNOSIS — Z5111 Encounter for antineoplastic chemotherapy: Secondary | ICD-10-CM | POA: Insufficient documentation

## 2016-07-30 DIAGNOSIS — C50211 Malignant neoplasm of upper-inner quadrant of right female breast: Secondary | ICD-10-CM | POA: Diagnosis not present

## 2016-07-30 DIAGNOSIS — Z170 Estrogen receptor positive status [ER+]: Secondary | ICD-10-CM | POA: Diagnosis not present

## 2016-07-30 DIAGNOSIS — K219 Gastro-esophageal reflux disease without esophagitis: Secondary | ICD-10-CM | POA: Insufficient documentation

## 2016-07-30 DIAGNOSIS — I2510 Atherosclerotic heart disease of native coronary artery without angina pectoris: Secondary | ICD-10-CM | POA: Insufficient documentation

## 2016-07-30 DIAGNOSIS — I509 Heart failure, unspecified: Secondary | ICD-10-CM | POA: Insufficient documentation

## 2016-07-30 DIAGNOSIS — F419 Anxiety disorder, unspecified: Secondary | ICD-10-CM | POA: Insufficient documentation

## 2016-07-30 DIAGNOSIS — Z95810 Presence of automatic (implantable) cardiac defibrillator: Secondary | ICD-10-CM | POA: Insufficient documentation

## 2016-07-30 DIAGNOSIS — Z79899 Other long term (current) drug therapy: Secondary | ICD-10-CM

## 2016-07-30 DIAGNOSIS — I10 Essential (primary) hypertension: Secondary | ICD-10-CM | POA: Insufficient documentation

## 2016-07-30 DIAGNOSIS — E785 Hyperlipidemia, unspecified: Secondary | ICD-10-CM | POA: Insufficient documentation

## 2016-07-30 DIAGNOSIS — I252 Old myocardial infarction: Secondary | ICD-10-CM | POA: Insufficient documentation

## 2016-07-30 LAB — COMPREHENSIVE METABOLIC PANEL
ALBUMIN: 4.1 g/dL (ref 3.5–5.0)
ALK PHOS: 80 U/L (ref 38–126)
ALT: 16 U/L (ref 14–54)
AST: 29 U/L (ref 15–41)
Anion gap: 6 (ref 5–15)
BILIRUBIN TOTAL: 0.7 mg/dL (ref 0.3–1.2)
BUN: 18 mg/dL (ref 6–20)
CO2: 27 mmol/L (ref 22–32)
Calcium: 8.6 mg/dL — ABNORMAL LOW (ref 8.9–10.3)
Chloride: 103 mmol/L (ref 101–111)
Creatinine, Ser: 1.07 mg/dL — ABNORMAL HIGH (ref 0.44–1.00)
GFR calc Af Amer: 60 mL/min — ABNORMAL LOW (ref >60)
GFR calc non Af Amer: 52 mL/min — ABNORMAL LOW (ref >60)
GLUCOSE: 93 mg/dL (ref 65–99)
POTASSIUM: 4 mmol/L (ref 3.5–5.1)
Sodium: 136 mmol/L (ref 135–145)
TOTAL PROTEIN: 7 g/dL (ref 6.5–8.1)

## 2016-07-30 LAB — CBC WITH DIFFERENTIAL/PLATELET
Basophils Absolute: 0.1 10*3/uL (ref 0–0.1)
Basophils Relative: 1 %
Eosinophils Absolute: 0.2 10*3/uL (ref 0–0.7)
Eosinophils Relative: 3 %
HCT: 37.8 % (ref 35.0–47.0)
Hemoglobin: 12.9 g/dL (ref 12.0–16.0)
Lymphocytes Relative: 31 %
Lymphs Abs: 2.1 10*3/uL (ref 1.0–3.6)
MCH: 29.7 pg (ref 26.0–34.0)
MCHC: 34.1 g/dL (ref 32.0–36.0)
MCV: 87.1 fL (ref 80.0–100.0)
Monocytes Absolute: 0.5 10*3/uL (ref 0.2–0.9)
Monocytes Relative: 7 %
Neutro Abs: 3.8 10*3/uL (ref 1.4–6.5)
Neutrophils Relative %: 58 %
Platelets: 141 10*3/uL — ABNORMAL LOW (ref 150–440)
RBC: 4.34 MIL/uL (ref 3.80–5.20)
RDW: 15.8 % — ABNORMAL HIGH (ref 11.5–14.5)
WBC: 6.6 10*3/uL (ref 3.6–11.0)

## 2016-07-30 MED ORDER — HEPARIN SOD (PORK) LOCK FLUSH 100 UNIT/ML IV SOLN
500.0000 [IU] | Freq: Once | INTRAVENOUS | Status: DC | PRN
  Filled 2016-07-30: qty 5

## 2016-07-30 MED ORDER — SODIUM CHLORIDE 0.9 % IV SOLN
75.0000 mg/m2 | Freq: Once | INTRAVENOUS | Status: AC
  Administered 2016-07-30: 100 mg via INTRAVENOUS
  Filled 2016-07-30: qty 10

## 2016-07-30 MED ORDER — SODIUM CHLORIDE 0.9 % IV SOLN
Freq: Once | INTRAVENOUS | Status: AC
  Administered 2016-07-30: 11:00:00 via INTRAVENOUS
  Filled 2016-07-30: qty 1000

## 2016-07-30 MED ORDER — PALONOSETRON HCL INJECTION 0.25 MG/5ML
0.2500 mg | Freq: Once | INTRAVENOUS | Status: AC
  Administered 2016-07-30: 0.25 mg via INTRAVENOUS
  Filled 2016-07-30: qty 5

## 2016-07-30 MED ORDER — SODIUM CHLORIDE 0.9 % IV SOLN
600.0000 mg/m2 | Freq: Once | INTRAVENOUS | Status: AC
  Administered 2016-07-30: 820 mg via INTRAVENOUS
  Filled 2016-07-30: qty 41

## 2016-07-30 MED ORDER — SODIUM CHLORIDE 0.9 % IV SOLN: 10.0000 mg | Freq: Once | INTRAVENOUS | Status: DC

## 2016-07-30 MED ORDER — DEXAMETHASONE SODIUM PHOSPHATE 10 MG/ML IJ SOLN
10.0000 mg | Freq: Once | INTRAMUSCULAR | Status: AC
  Administered 2016-07-30: 10 mg via INTRAVENOUS
  Filled 2016-07-30: qty 1

## 2016-07-30 MED ORDER — SODIUM CHLORIDE 0.9% FLUSH
10.0000 mL | INTRAVENOUS | Status: DC | PRN
  Administered 2016-07-30: 10 mL
  Filled 2016-07-30: qty 10

## 2016-07-30 MED ORDER — DOCETAXEL CHEMO INJECTION 160 MG/16ML: 75.0000 mg/m2 | Freq: Once | INTRAVENOUS | Status: DC

## 2016-07-30 MED ORDER — PEGFILGRASTIM 6 MG/0.6ML ~~LOC~~ PSKT
6.0000 mg | PREFILLED_SYRINGE | Freq: Once | SUBCUTANEOUS | Status: AC
  Administered 2016-07-30: 6 mg via SUBCUTANEOUS
  Filled 2016-07-30: qty 0.6

## 2016-08-05 NOTE — Progress Notes (Deleted)
[place]  Telephone:(336) [phone] Fax:(336) [phone]  ID: Christine [name] OB: April 29, 1946  MR#: [id number]  AOZ#:[id number]  Patient Care Team: Glendon Axe, MD as PCP - General (Internal Medicine)  CHIEF COMPLAINT: Clinical stage IIIB ER/PR positive, HER-2 negative invasive carcinoma of the upper inner quadrant of the right breast.  INTERVAL HISTORY: Patient returns to clinic today for further evaluation and initiation of cycle 1 of 4 of neoadjuvant Taxotere and Cytoxan. She continues to be anxious, but otherwise feels well. The lesion on her breast continues to drain serosanguineous fluid. She has no neurologic complaints. She denies any recent fevers or illnesses. She has a good appetite and denies weight loss. She has some breast tenderness, but denies any chest pain or shortness of breath. She has no nausea, vomiting, constipation, or diarrhea. She has no urinary complaints. Patient otherwise feels well and offers no further specific complaints.  REVIEW OF SYSTEMS:   Review of Systems  Constitutional: Negative.  Negative for fever, malaise/fatigue and weight loss.  Respiratory: Negative.  Negative for cough and shortness of breath.   Cardiovascular: Negative.  Negative for chest pain and leg swelling.  Gastrointestinal: Negative.  Negative for abdominal pain.  Genitourinary: Negative.   Musculoskeletal: Negative.   Neurological: Negative.  Negative for weakness.  Psychiatric/Behavioral: The patient is nervous/anxious.     As per HPI. Otherwise, a complete review of systems is negative.  PAST MEDICAL HISTORY: Past Medical History:  Diagnosis Date  . Allergic rhinitis   . Anxiety   . Breast cancer ([place])   . Chronic systolic heart failure ([place])   . Coronary artery disease   . GERD (gastroesophageal reflux disease)   . Heart murmur   . Hyperlipidemia   . Hypertension   . Implantable defibrillator St Jude    DOI 2006  . MI (myocardial infarction)   .  Osteoarthritis     PAST SURGICAL HISTORY: Past Surgical History:  Procedure Laterality Date  . CHF exacerbation  10/07  . CORONARY STENT PLACEMENT  8/06   3 stents; myocardial infarction  . NM MYOVIEW LTD  1/08   EF 39%, no sx ischemia  . PACEMAKER PLACEMENT  2/07   Defibrillator    FAMILY HISTORY: Family History  Problem Relation Age of Onset  . Stroke Mother 19  . Heart attack Father   . Coronary artery disease Father   . Heart attack Brother   . Coronary artery disease Other   . Hypertension Other   . Cancer Neg Hx     No breast or colon cancer    ADVANCED DIRECTIVES (Y/N):  N  HEALTH MAINTENANCE: Social History  Substance Use Topics  . Smoking status: Current Every Day Smoker    Packs/day: 0.50  . Smokeless tobacco: Never Used     Comment: also uses E-cig  . Alcohol use No     Comment: 2-3 times per month     Colonoscopy:  PAP:  Bone density:  Lipid panel:  Allergies  Allergen Reactions  . Atorvastatin     REACTION: increased LFT's  . Lisinopril     REACTION: cough  . Oxycodone Hcl     REACTION: unspecified  . Simvastatin     REACTION: myalgias    Current Outpatient Prescriptions  Medication Sig Dispense Refill  . aspirin 81 MG tablet Take 81 mg by mouth daily.    . bisacodyl (DULCOLAX) 5 MG EC tablet Take 5 mg by mouth daily as needed for mild constipation or  moderate constipation.    . carvedilol (COREG) 25 MG tablet TAKE 1 TABLET BY MOUTH TWICE A DAY 60 tablet 2  . Cholecalciferol (VITAMIN D) 2000 UNITS tablet Take 2,000 Units by mouth daily.    . furosemide (LASIX) 40 MG tablet Take 40 mg by mouth as needed.    . lidocaine-prilocaine (EMLA) cream Apply to affected area once 30 g 3  . LORazepam (ATIVAN) 1 MG tablet Take 1 mg by mouth daily as needed for anxiety.    . Multiple Vitamin (MULTIVITAMIN) tablet Take 1 tablet by mouth daily.    Marland Kitchen omeprazole (PRILOSEC) 20 MG capsule Take 20 mg by mouth 2 (two) times daily.      . ondansetron  (ZOFRAN) 8 MG tablet Take 1 tablet (8 mg total) by mouth 2 (two) times daily as needed for refractory nausea / vomiting. 30 tablet 1  . prochlorperazine (COMPAZINE) 10 MG tablet Take 1 tablet (10 mg total) by mouth every 6 (six) hours as needed (Nausea or vomiting). 30 tablet 1  . rosuvastatin (CRESTOR) 10 MG tablet Take 10 mg by mouth at bedtime.      . traMADol (ULTRAM) 50 MG tablet Take 50 mg by mouth every 6 (six) hours as needed for moderate pain.     . valsartan (DIOVAN) 320 MG tablet Take 320 mg by mouth daily.     No current facility-administered medications for this visit.     OBJECTIVE: There were no vitals filed for this visit.   There is no height or weight on file to calculate BMI.    ECOG FS:0 - Asymptomatic  General: Well-developed, well-nourished, no acute distress. Eyes: Pink conjunctiva, anicteric sclera. Breasts: Approximately 2-3 cm mass in the upper inner quadrant with skin ulceration. Easily palpable 1 cm axillary lymph node. Lungs: Clear to auscultation bilaterally. Heart: Regular rate and rhythm. No rubs, murmurs, or gallops. Abdomen: Soft, nontender, nondistended. No organomegaly noted, normoactive bowel sounds. Musculoskeletal: No edema, cyanosis, or clubbing. Neuro: Alert, answering all questions appropriately. Cranial nerves grossly intact. Skin: No rashes or petechiae noted. Psych: Normal affect.   LAB RESULTS:  Lab Results  Component Value Date   NA 136 07/30/2016   K 4.0 07/30/2016   CL 103 07/30/2016   CO2 27 07/30/2016   GLUCOSE 93 07/30/2016   BUN 18 07/30/2016   CREATININE 1.07 (H) 07/30/2016   CALCIUM 8.6 (L) 07/30/2016   PROT 7.0 07/30/2016   ALBUMIN 4.1 07/30/2016   AST 29 07/30/2016   ALT 16 07/30/2016   ALKPHOS 80 07/30/2016   BILITOT 0.7 07/30/2016   GFRNONAA 52 (L) 07/30/2016   GFRAA 60 (L) 07/30/2016    Lab Results  Component Value Date   WBC 6.6 07/30/2016   NEUTROABS 3.8 07/30/2016   HGB 12.9 07/30/2016   HCT 37.8  07/30/2016   MCV 87.1 07/30/2016   PLT 141 (L) 07/30/2016     STUDIES: Nm Pet Image Restag (ps) Skull Base To Thigh  Result Date: 07/08/2016 CLINICAL DATA:  Subsequent treatment strategy for left upper inner quadrant breast cancer. EXAM: NUCLEAR MEDICINE PET SKULL BASE TO THIGH TECHNIQUE: 12.1 mCi F-18 FDG was injected intravenously. Full-ring PET imaging was performed from the skull base to thigh after the radiotracer. CT data was obtained and used for attenuation correction and anatomic localization. FASTING BLOOD GLUCOSE:  Value: 89 mg/dl COMPARISON:  Multiple exams, including 01/26/2014 PET-CT FINDINGS: NECK No hypermetabolic lymph nodes in the neck. CHEST Hypermetabolic left breast mass measures 2.9 by 2.0 cm  and has a maximum standard uptake value of 9.7. Hypermetabolic left axillary lymph node measures 1.0 cm in short axis and has a maximum standard uptake value of 10.0. Superior segment left lower lobe pulmonary nodule measures 6 mm in diameter and has a maximum standard uptake value of 3.3. A subpleural lymph node in the left lower lobe along the major fissure only measures about 3 mm in thickness but appears faintly hypermetabolic with a maximum SUV of 1.2. A 4 mm nodule medially in the right upper lobe on image 63 of series 4 of the CT data is perceptibly hypermetabolic with maximum SUV 1.2. Cardiomegaly. Coronary, aortic arch, and branch vessel atherosclerotic vascular disease. AICD/pacer. ABDOMEN/PELVIS Cholelithiasis.  Aortoiliac atherosclerotic vascular disease. Sigmoid diverticulosis. No significant abnormal hypermetabolic activity in the abdomen or pelvis. SKELETON Anterolisthesis due to pars defects in the lower lumbar spine. No compelling findings of osseous metastatic disease. IMPRESSION: 1. Hypermetabolic left breast mass with hypermetabolic left adenopathy. 2. There several small pulmonary nodules which are probably due to malignancy given the hypermetabolic activity. The 6 mm superior  segment left lower lobe nodule has a maximum SUV of 3.3. An adjacent subpleural lymph node in the left lower lobe is mildly hypermetabolic given its very small size, and a 4 mm right upper lobe nodule is mildly hypermetabolic given its very small size. 3. Other imaging findings of potential clinical significance: Coronary, aortic arch, and branch vessel atherosclerotic vascular disease. Aortoiliac atherosclerotic vascular disease. Cholelithiasis. Sigmoid diverticulosis. Pars defects at L4 with anterolisthesis at L4-5. Partially sacralized L5. Cardiomegaly. Electronically Signed   By: Van Clines M.D.   On: 07/08/2016 10:38    ASSESSMENT: Clinical stage IIIB ER/PR positive, HER-2 negative invasive carcinoma of the upper inner quadrant of the right breast.  PLAN:    1. Clinical stage IIIB ER/PR positive, HER-2 negative invasive carcinoma of the upper inner quadrant of the right breast: Patient's initial diagnosis was at least in September 2012. She received one dose of chemotherapy using Taxotere and Cytoxan in 2013. She did not receive any XRT or hormonal treatment. PET scan results from July 08, 2016 reviewed independently and reported as above possibly suggesting metastatic lesions in her lung, but these are too small to characterize. Case discussed with surgery and it was agreed upon to use neoadjuvant chemotherapy to shrink her lesion and possibly make mastectomy easier. Goal will be to shrink tumor away from her defibrillator. Patient already has a port in place from her previous attempt at treatment. At the conclusion of all her treatments, patient will benefit from an aromatase inhibitor for a minimum of 5 years. Proceed with cycle 1 of 4 of Taxotere and Cytoxan. Patient will also receive OnPro Neulasta with each cycle. Return to clinic in 1 week for laboratory work and further evaluation and then in 3 weeks for consideration of cycle 2. 2. Defibrillator: [place] cardiology input. Will  coordinate removal and/or replacement of defibrillator at time of mastectomy.  Approximately 30 minutes was spent in discussion of which greater than 50% was consultation.  Patient expressed understanding and was in agreement with this plan. She also understands that She can call clinic at any time with any questions, concerns, or complaints.   Cancer Staging Breast cancer of upper-inner quadrant of right female breast [facility]) Staging form: Breast, AJCC 8th Edition - Clinical: Stage IIIB (cT4b, cN1, cM0, G2, ER: Positive, PR: Positive, HER2: Negative) - Signed by Lloyd Huger, MD on 07/01/2016   Lloyd Huger, MD  08/05/2016 3:48 PM

## 2016-08-06 ENCOUNTER — Inpatient Hospital Stay: Payer: Medicare Other

## 2016-08-06 ENCOUNTER — Inpatient Hospital Stay: Payer: Medicare Other | Admitting: Oncology

## 2016-08-11 NOTE — Progress Notes (Signed)
[facility]  [phone] [phone]) [phone]  ID: Christine [name] OB: Dec 07, 1945  MR#: [id number]  MOL#:[id number]  Patient Care Team: Glendon Axe, MD as PCP - General (Internal Medicine)  CHIEF COMPLAINT: Clinical stage IIIB ER/PR positive, HER-2 negative invasive carcinoma of the upper inner quadrant of the right breast.  INTERVAL HISTORY: Patient returns to clinic today for further evaluation and to assess her toleration of cycle 1 of 4 of neoadjuvant Taxotere and Cytoxan. She continues to be anxious, but otherwise feels well. She tolerated her chemotherapy well without significant side effects. She had some bony pain for several days after her Neulasta, but this has resolved. The lesion on her breast continues to drain serosanguineous fluid. She has no neurologic complaints. She denies any recent fevers or illnesses. She has a good appetite and denies weight loss. She has some breast tenderness, but denies any chest pain or shortness of breath. She has no nausea, vomiting, constipation, or diarrhea. She has no urinary complaints. Patient otherwise feels well and offers no further specific complaints.  REVIEW OF SYSTEMS:   Review of Systems  Constitutional: Negative.  Negative for fever, malaise/fatigue and weight loss.  Respiratory: Negative.  Negative for cough and shortness of breath.   Cardiovascular: Negative.  Negative for chest pain and leg swelling.  Gastrointestinal: Negative.  Negative for abdominal pain.  Genitourinary: Negative.   Musculoskeletal: Negative.   Neurological: Negative.  Negative for weakness.  Psychiatric/Behavioral: The patient is nervous/anxious.     As per HPI. Otherwise, a complete review of systems is negative.  PAST MEDICAL HISTORY: Past Medical History:  Diagnosis Date  . Allergic rhinitis   . Anxiety   . Breast cancer ([place])   . Chronic systolic heart failure ([place])   . Coronary artery disease   . GERD  (gastroesophageal reflux disease)   . Heart murmur   . Hyperlipidemia   . Hypertension   . Implantable defibrillator St Jude    DOI 2006  . MI (myocardial infarction)   . Osteoarthritis     PAST SURGICAL HISTORY: Past Surgical History:  Procedure Laterality Date  . CHF exacerbation  10/07  . CORONARY STENT PLACEMENT  8/06   3 stents; myocardial infarction  . NM MYOVIEW LTD  1/08   EF 39%, no sx ischemia  . PACEMAKER PLACEMENT  2/07   Defibrillator    FAMILY HISTORY: Family History  Problem Relation Age of Onset  . Stroke Mother 27  . Heart attack Father   . Coronary artery disease Father   . Heart attack Brother   . Coronary artery disease Other   . Hypertension Other   . Cancer Neg Hx     No breast or colon cancer    ADVANCED DIRECTIVES (Y/N):  N  HEALTH MAINTENANCE: Social History  Substance Use Topics  . Smoking status: Current Every Day Smoker    Packs/day: 0.50  . Smokeless tobacco: Never Used     Comment: also uses E-cig  . Alcohol use No     Comment: 2-3 times per month     Colonoscopy:  PAP:  Bone density:  Lipid panel:  Allergies  Allergen Reactions  . Atorvastatin     REACTION: increased LFT's  . Lisinopril     REACTION: cough  . Oxycodone Hcl     REACTION: unspecified  . Simvastatin     REACTION: myalgias    Current Outpatient Prescriptions  Medication Sig Dispense Refill  . aspirin 81 MG tablet Take  81 mg by mouth daily.    . bisacodyl (DULCOLAX) 5 MG EC tablet Take 5 mg by mouth daily as needed for mild constipation or moderate constipation.    . carvedilol (COREG) 25 MG tablet TAKE 1 TABLET BY MOUTH TWICE A DAY 60 tablet 2  . Cholecalciferol (VITAMIN D) 2000 UNITS tablet Take 2,000 Units by mouth daily.    . furosemide (LASIX) 40 MG tablet Take 40 mg by mouth as needed.    . lidocaine-prilocaine (EMLA) cream Apply to affected area once 30 g 3  . LORazepam (ATIVAN) 1 MG tablet Take 1 mg by mouth daily as needed for anxiety.    .  Multiple Vitamin (MULTIVITAMIN) tablet Take 1 tablet by mouth daily.    Marland Kitchen omeprazole (PRILOSEC) 20 MG capsule Take 20 mg by mouth 2 (two) times daily.      . ondansetron (ZOFRAN) 8 MG tablet Take 1 tablet (8 mg total) by mouth 2 (two) times daily as needed for refractory nausea / vomiting. 30 tablet 1  . prochlorperazine (COMPAZINE) 10 MG tablet Take 1 tablet (10 mg total) by mouth every 6 (six) hours as needed (Nausea or vomiting). 30 tablet 1  . rosuvastatin (CRESTOR) 10 MG tablet Take 10 mg by mouth at bedtime.      . traMADol (ULTRAM) 50 MG tablet Take 50 mg by mouth every 6 (six) hours as needed for moderate pain.     . valsartan (DIOVAN) 320 MG tablet Take 320 mg by mouth daily.    Marland Kitchen HYDROcodone-acetaminophen (NORCO/VICODIN) 5-325 MG tablet Take 1 tablet by mouth every 6 (six) hours as needed for moderate pain. 30 tablet 0   No current facility-administered medications for this visit.     OBJECTIVE: Vitals:   08/12/16 1103  BP: (!) 146/81  Pulse: 87  Resp: 18  Temp: 98.2 F (36.8 C)     Body mass index is 17.42 kg/m.    ECOG FS:0 - Asymptomatic  General: Well-developed, well-nourished, no acute distress. Eyes: Pink conjunctiva, anicteric sclera. Breasts: Approximately 2-3 cm mass in the upper inner quadrant with skin ulceration. Easily palpable 1 cm axillary lymph node. Lungs: Clear to auscultation bilaterally. Heart: Regular rate and rhythm. No rubs, murmurs, or gallops. Abdomen: Soft, nontender, nondistended. No organomegaly noted, normoactive bowel sounds. Musculoskeletal: No edema, cyanosis, or clubbing. Neuro: Alert, answering all questions appropriately. Cranial nerves grossly intact. Skin: No rashes or petechiae noted. Psych: Normal affect.   LAB RESULTS:  Lab Results  Component Value Date   NA 140 08/12/2016   K 3.4 (L) 08/12/2016   CL 105 08/12/2016   CO2 28 08/12/2016   GLUCOSE 102 (H) 08/12/2016   BUN <5 (L) 08/12/2016   CREATININE 0.77 08/12/2016    CALCIUM 8.5 (L) 08/12/2016   PROT 6.3 (L) 08/12/2016   ALBUMIN 3.7 08/12/2016   AST 26 08/12/2016   ALT 16 08/12/2016   ALKPHOS 155 (H) 08/12/2016   BILITOT 0.6 08/12/2016   GFRNONAA >60 08/12/2016   GFRAA >60 08/12/2016    Lab Results  Component Value Date   WBC 25.2 (H) 08/12/2016   NEUTROABS 22.0 (H) 08/12/2016   HGB 11.5 (L) 08/12/2016   HCT 34.1 (L) 08/12/2016   MCV 87.5 08/12/2016   PLT 129 (L) 08/12/2016     STUDIES: No results found.  ASSESSMENT: Clinical stage IIIB ER/PR positive, HER-2 negative invasive carcinoma of the upper inner quadrant of the right breast.  PLAN:    1. Clinical stage IIIB ER/PR positive,  HER-2 negative invasive carcinoma of the upper inner quadrant of the right breast: Patient's initial diagnosis was at least in September 2012. She received one dose of chemotherapy using Taxotere and Cytoxan in 2013. She did not receive any XRT or hormonal treatment. PET scan results from July 08, 2016 reviewed independently possibly suggesting metastatic lesions in her lung, but these are too small to characterize. Case discussed with surgery and it was agreed upon to use neoadjuvant chemotherapy to shrink her lesion and possibly make mastectomy easier. Goal will be to shrink tumor away from her defibrillator. Patient already has a port in place from her previous attempt at treatment. At the conclusion of all her treatments, patient will benefit from an aromatase inhibitor for a minimum of 5 years. Patient tolerated cycle 1 of 4 of Taxotere and Cytoxan without significant side effects. Patient will also receive OnPro Neulasta with each cycle. Return to clinic in 1 week for consideration of cycle 2. 2. Defibrillator: [facility] cardiology input. Will coordinate removal and/or replacement of defibrillator at time of mastectomy.  Patient expressed understanding and was in agreement with this plan. She also understands that She can call clinic at any time with any  questions, concerns, or complaints.   Cancer Staging Breast cancer of upper-inner quadrant of right female breast [facility]) Staging form: Breast, AJCC 8th Edition - Clinical: Stage IIIB (cT4b, cN1, cM0, G2, ER: Positive, PR: Positive, HER2: Negative) - Signed by Lloyd Huger, MD on 07/01/2016   Lloyd Huger, MD   08/15/2016 1:42 PM

## 2016-08-12 ENCOUNTER — Inpatient Hospital Stay (HOSPITAL_BASED_OUTPATIENT_CLINIC_OR_DEPARTMENT_OTHER): Payer: Medicare Other | Admitting: Oncology

## 2016-08-12 ENCOUNTER — Inpatient Hospital Stay: Payer: Medicare Other

## 2016-08-12 VITALS — BP 146/81 | HR 87 | Temp 98.2°F | Resp 18 | Wt 95.2 lb

## 2016-08-12 DIAGNOSIS — Z170 Estrogen receptor positive status [ER+]: Secondary | ICD-10-CM

## 2016-08-12 DIAGNOSIS — Z7689 Persons encountering health services in other specified circumstances: Secondary | ICD-10-CM

## 2016-08-12 DIAGNOSIS — F419 Anxiety disorder, unspecified: Secondary | ICD-10-CM | POA: Diagnosis not present

## 2016-08-12 DIAGNOSIS — Z95810 Presence of automatic (implantable) cardiac defibrillator: Secondary | ICD-10-CM

## 2016-08-12 DIAGNOSIS — Z5111 Encounter for antineoplastic chemotherapy: Secondary | ICD-10-CM | POA: Diagnosis not present

## 2016-08-12 DIAGNOSIS — I2510 Atherosclerotic heart disease of native coronary artery without angina pectoris: Secondary | ICD-10-CM

## 2016-08-12 DIAGNOSIS — F17210 Nicotine dependence, cigarettes, uncomplicated: Secondary | ICD-10-CM | POA: Diagnosis not present

## 2016-08-12 DIAGNOSIS — C50211 Malignant neoplasm of upper-inner quadrant of right female breast: Secondary | ICD-10-CM | POA: Diagnosis not present

## 2016-08-12 DIAGNOSIS — Z79899 Other long term (current) drug therapy: Secondary | ICD-10-CM | POA: Diagnosis not present

## 2016-08-12 DIAGNOSIS — I10 Essential (primary) hypertension: Secondary | ICD-10-CM | POA: Diagnosis not present

## 2016-08-12 DIAGNOSIS — K219 Gastro-esophageal reflux disease without esophagitis: Secondary | ICD-10-CM

## 2016-08-12 DIAGNOSIS — I509 Heart failure, unspecified: Secondary | ICD-10-CM

## 2016-08-12 DIAGNOSIS — I252 Old myocardial infarction: Secondary | ICD-10-CM

## 2016-08-12 DIAGNOSIS — E785 Hyperlipidemia, unspecified: Secondary | ICD-10-CM

## 2016-08-12 LAB — COMPREHENSIVE METABOLIC PANEL
ALT: 16 U/L (ref 14–54)
ANION GAP: 7 (ref 5–15)
AST: 26 U/L (ref 15–41)
Albumin: 3.7 g/dL (ref 3.5–5.0)
Alkaline Phosphatase: 155 U/L — ABNORMAL HIGH (ref 38–126)
BUN: <5 mg/dL — ABNORMAL LOW (ref 6–20)
CALCIUM: 8.5 mg/dL — AB (ref 8.9–10.3)
CHLORIDE: 105 mmol/L (ref 101–111)
CO2: 28 mmol/L (ref 22–32)
CREATININE: 0.77 mg/dL (ref 0.44–1.00)
Glucose, Bld: 102 mg/dL — ABNORMAL HIGH (ref 65–99)
Potassium: 3.4 mmol/L — ABNORMAL LOW (ref 3.5–5.1)
Sodium: 140 mmol/L (ref 135–145)
Total Bilirubin: 0.6 mg/dL (ref 0.3–1.2)
Total Protein: 6.3 g/dL — ABNORMAL LOW (ref 6.5–8.1)

## 2016-08-12 LAB — CBC WITH DIFFERENTIAL/PLATELET
Basophils Absolute: 0.1 10*3/uL (ref 0–0.1)
Basophils Relative: 1 %
EOS ABS: 0 10*3/uL (ref 0–0.7)
EOS PCT: 0 %
HCT: 34.1 % — ABNORMAL LOW (ref 35.0–47.0)
Hemoglobin: 11.5 g/dL — ABNORMAL LOW (ref 12.0–16.0)
LYMPHS ABS: 1.5 10*3/uL (ref 1.0–3.6)
LYMPHS PCT: 6 %
MCH: 29.4 pg (ref 26.0–34.0)
MCHC: 33.6 g/dL (ref 32.0–36.0)
MCV: 87.5 fL (ref 80.0–100.0)
MONO ABS: 1.5 10*3/uL — AB (ref 0.2–0.9)
MONOS PCT: 6 %
Neutro Abs: 22 10*3/uL — ABNORMAL HIGH (ref 1.4–6.5)
Neutrophils Relative %: 87 %
PLATELETS: 129 10*3/uL — AB (ref 150–440)
RBC: 3.9 MIL/uL (ref 3.80–5.20)
RDW: 15.2 % — AB (ref 11.5–14.5)
WBC: 25.2 10*3/uL — ABNORMAL HIGH (ref 3.6–11.0)

## 2016-08-12 MED ORDER — HYDROCODONE-ACETAMINOPHEN 5-325 MG PO TABS: 1.0000 | ORAL_TABLET | Freq: Four times a day (QID) | ORAL | 0 refills | Status: DC | PRN

## 2016-08-12 NOTE — Progress Notes (Signed)
Patient states the lesion on her chest is still spurting blood at times.  States she still has some pain in her chest.

## 2016-08-19 ENCOUNTER — Telehealth: Payer: Self-pay | Admitting: *Deleted

## 2016-08-19 NOTE — Telephone Encounter (Signed)
Patient reports that she has hives in her genital area, rectal area that is burning and itching

## 2016-08-19 NOTE — Telephone Encounter (Signed)
Per Dr Grayland Ormond, this is unrelated to chemo, ask if she has used any new creams or powders, it sounds like a contact dermatitis. She should contact Dr Candiss Norse for care of this. Patient informed and statse she has used creams trying to get rid of it. She will call Dr Candiss Norse.

## 2016-08-20 ENCOUNTER — Inpatient Hospital Stay: Payer: Medicare Other | Admitting: Oncology

## 2016-08-20 ENCOUNTER — Inpatient Hospital Stay: Payer: Medicare Other

## 2017-01-13 ENCOUNTER — Telehealth: Payer: Self-pay | Admitting: *Deleted
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
## 2017-01-14 ENCOUNTER — Encounter: Payer: Self-pay | Admitting: Emergency Medicine

## 2017-01-14 ENCOUNTER — Emergency Department
Admission: EM | Admit: 2017-01-14 | Discharge: 2017-01-14 | Disposition: A | Discharge status: Home / Self Care | Payer: Medicare Other | Attending: Emergency Medicine | Admitting: Emergency Medicine

## 2017-01-14 ENCOUNTER — Emergency Department: Payer: Medicare Other

## 2017-01-14 DIAGNOSIS — I5022 Chronic systolic (congestive) heart failure: Secondary | ICD-10-CM | POA: Diagnosis not present

## 2017-01-14 DIAGNOSIS — J449 Chronic obstructive pulmonary disease, unspecified: Secondary | ICD-10-CM | POA: Diagnosis not present

## 2017-01-14 DIAGNOSIS — W208XXA Other cause of strike by thrown, projected or falling object, initial encounter: Secondary | ICD-10-CM | POA: Diagnosis not present

## 2017-01-14 DIAGNOSIS — Y929 Unspecified place or not applicable: Secondary | ICD-10-CM | POA: Diagnosis not present

## 2017-01-14 DIAGNOSIS — F17200 Nicotine dependence, unspecified, uncomplicated: Secondary | ICD-10-CM | POA: Insufficient documentation

## 2017-01-14 DIAGNOSIS — I10 Essential (primary) hypertension: Secondary | ICD-10-CM | POA: Diagnosis not present

## 2017-01-14 DIAGNOSIS — Y939 Activity, unspecified: Secondary | ICD-10-CM | POA: Diagnosis not present

## 2017-01-14 DIAGNOSIS — Z79899 Other long term (current) drug therapy: Secondary | ICD-10-CM | POA: Insufficient documentation

## 2017-01-14 DIAGNOSIS — I2510 Atherosclerotic heart disease of native coronary artery without angina pectoris: Secondary | ICD-10-CM | POA: Insufficient documentation

## 2017-01-14 DIAGNOSIS — Y999 Unspecified external cause status: Secondary | ICD-10-CM | POA: Diagnosis not present

## 2017-01-14 DIAGNOSIS — I110 Hypertensive heart disease with heart failure: Secondary | ICD-10-CM | POA: Insufficient documentation

## 2017-01-14 DIAGNOSIS — Z7982 Long term (current) use of aspirin: Secondary | ICD-10-CM | POA: Insufficient documentation

## 2017-01-14 DIAGNOSIS — S99922A Unspecified injury of left foot, initial encounter: Secondary | ICD-10-CM | POA: Diagnosis present

## 2017-01-14 DIAGNOSIS — S9032XA Contusion of left foot, initial encounter: Secondary | ICD-10-CM | POA: Diagnosis not present

## 2017-01-14 MED ORDER — HYDROCODONE-ACETAMINOPHEN 5-325 MG PO TABS
1.0000 | ORAL_TABLET | Freq: Once | ORAL | Status: AC
  Administered 2017-01-14: 0.5 via ORAL
  Filled 2017-01-14: qty 1

## 2017-01-14 NOTE — ED Triage Notes (Signed)
Pt to triage via w/c with no distress noted; pt reports dropped heavy glass paper weight on left foot at 2pm today; cont to have pain

## 2017-01-14 NOTE — ED Provider Notes (Signed)
[facility] Emergency Department Provider Note ____________________________________________  Time seen: Approximately 11:08 PM  I have reviewed the triage vital signs and the nursing notes.   HISTORY  Chief Complaint Foot Pain    HPI Christine [name] is a 71 y.o. female who presents to the emergency department for evaluation of left foot pain after dropping a heavy paperweight on it earlier this afternoon. She has taken 1/2 of a hydrocodone without much relief. Pain increases with any attempt to bear weight.   Past Medical History:  Diagnosis Date  . Allergic rhinitis   . Anxiety   . Breast cancer ([place])   . Chronic systolic heart failure ([place])   . Coronary artery disease   . GERD (gastroesophageal reflux disease)   . Heart murmur   . Hyperlipidemia   . Hypertension   . Implantable defibrillator St Jude    DOI 2006  . MI (myocardial infarction) ([place])   . Osteoarthritis     Patient Active Problem List   Diagnosis Date Noted  . Breast cancer of upper-inner quadrant of right female breast ([place]) 07/01/2016  . Moderate single current episode of major depressive disorder ([place]) 07/16/2015  . Headache 10/31/2014  . Chronic obstructive pulmonary disease ([place]) 04/05/2014  . Current tobacco use 04/05/2014  . Anxiety 11/25/2013  . Breast cancer ([place]) 11/25/2013  . Depression 11/25/2013  . Thrombus 03/17/2011  . CARDIOMYOPATHY, ISCHEMIC 11/09/2008  . ICD - IN SITU 11/09/2008  . ANEMIA 07/30/2007  . ABNORMAL THYROID FUNCTION TESTS 07/30/2007  . Mixed hyperlipidemia 11/16/2006  . Essential hypertension 11/16/2006  . Coronary atherosclerosis 11/16/2006  . FAILURE, SYSTOLIC HEART, CHRONIC 67/59/1638  . ALLERGIC RHINITIS 11/16/2006  . GERD 11/16/2006  . Primary osteoarthritis involving multiple joints 11/16/2006  . BACK PAIN 11/16/2006    Past Surgical History:  Procedure Laterality Date  . CHF exacerbation  10/07  . CORONARY STENT PLACEMENT  8/06   3  stents; myocardial infarction  . NM MYOVIEW LTD  1/08   EF 39%, no sx ischemia  . PACEMAKER PLACEMENT  2/07   Defibrillator    Prior to Admission medications   Medication Sig Start Date End Date Taking? Authorizing Provider  aspirin 81 MG tablet Take 81 mg by mouth daily.    [provider]  bisacodyl (DULCOLAX) 5 MG EC tablet Take 5 mg by mouth daily as needed for mild constipation or moderate constipation.    [provider]  carvedilol (COREG) 25 MG tablet TAKE 1 TABLET BY MOUTH TWICE A DAY 09/09/10   Venia Carbon, MD  Cholecalciferol (VITAMIN D) 2000 UNITS tablet Take 2,000 Units by mouth daily.    [provider]  furosemide (LASIX) 40 MG tablet Take 40 mg by mouth as needed. 07/22/16   [provider]  HYDROcodone-acetaminophen (NORCO/VICODIN) 5-325 MG tablet Take 1 tablet by mouth every 6 (six) hours as needed for moderate pain. 08/12/16   Lloyd Huger, MD  lidocaine-prilocaine (EMLA) cream Apply to affected area once 07/18/16   Lloyd Huger, MD  LORazepam (ATIVAN) 1 MG tablet Take 1 mg by mouth daily as needed for anxiety.    [provider]  Multiple Vitamin (MULTIVITAMIN) tablet Take 1 tablet by mouth daily.    [provider]  omeprazole (PRILOSEC) 20 MG capsule Take 20 mg by mouth 2 (two) times daily.      [provider]  ondansetron (ZOFRAN) 8 MG tablet Take 1 tablet (8 mg total) by mouth 2 (  two) times daily as needed for refractory nausea / vomiting. 07/18/16   Lloyd Huger, MD  prochlorperazine (COMPAZINE) 10 MG tablet Take 1 tablet (10 mg total) by mouth every 6 (six) hours as needed (Nausea or vomiting). 07/18/16   Lloyd Huger, MD  rosuvastatin (CRESTOR) 10 MG tablet Take 10 mg by mouth at bedtime.      [provider]  traMADol (ULTRAM) 50 MG tablet Take 50 mg by mouth every 6 (six) hours as needed for moderate pain.     [provider]  valsartan (DIOVAN) 320 MG tablet  Take 320 mg by mouth daily.    [provider]    Allergies Atorvastatin; Lisinopril; Oxycodone hcl; and Simvastatin  Family History  Problem Relation Age of Onset  . Stroke Mother 41  . Heart attack Father   . Coronary artery disease Father   . Heart attack Brother   . Coronary artery disease Other   . Hypertension Other   . Cancer Neg Hx        No breast or colon cancer    Social History Social History  Substance Use Topics  . Smoking status: Current Every Day Smoker    Packs/day: 0.50  . Smokeless tobacco: Never Used     Comment: also uses E-cig  . Alcohol use No     Comment: 2-3 times per month    Review of Systems Constitutional: Negative for fever. Cardiovascular: Negative for chest pain. Respiratory: Negative for shortness of breath. Musculoskeletal: Positive for left foot pain Skin: Positive for contusion.  Neurological: Negative for paresthesias.  ____________________________________________   PHYSICAL EXAM:  VITAL SIGNS: ED Triage Vitals  Enc Vitals Group     BP 01/14/17 2216 (!) 126/55     Pulse Rate 01/14/17 2216 73     Resp 01/14/17 2216 18     Temp 01/14/17 2216 (!) 97.5 F (36.4 C)     Temp Source 01/14/17 2216 Oral     SpO2 01/14/17 2216 100 %     Weight 01/14/17 2218 93 lb (42.2 kg)     Height 01/14/17 2218 5\' 2"  (1.575 m)     Head Circumference --      Peak Flow --      Pain Score 01/14/17 2221 9     Pain Loc --      Pain Edu? --      Excl. in [place]? --     Constitutional: Alert and oriented. Well appearing and in no acute distress. Eyes: Conjunctivae are clear without discharge or drainage.  Head: Atraumatic. Neck: Active range of motion observed. Respiratory: Respirations are even and unlabored. Musculoskeletal:  Full range of motion of the toes on the left foot. Neurologic: Sharp and dull sensation is intact, specifically over the left foot.  Skin: Contusion noted to the dorsal aspect of the left foot.  Psychiatric:  Behavior and affect appropriate.  ____________________________________________   LABS (all labs ordered are listed, but only abnormal results are displayed)  Labs Reviewed - No data to display ____________________________________________  RADIOLOGY  No acute bony abnormality of the left foot per radiology. ____________________________________________   PROCEDURES  Procedure(s) performed: ACE bandage applied to the left foot and ankle. Patient neurovascularly intact post application.  ____________________________________________   INITIAL IMPRESSION / ASSESSMENT AND PLAN / ED COURSE  Christine [name] is a 71 y.o. female who presents to the emergency department for evaluation of traumatic left foot pain after dropping a paperweight this afternoon. No  acute fracture on x-ray. She was advised to take her hydrocodone as prescribed and to follow up with podiatry for symptoms that are not improving over the next few days.  Pertinent labs & imaging results that were available during my care of the patient were reviewed by me and considered in my medical decision making (see chart for details).  _________________________________________   FINAL CLINICAL IMPRESSION(S) / ED DIAGNOSES  Final diagnoses:  Contusion of left foot, initial encounter    Discharge Medication List as of 01/14/2017 11:23 PM      If controlled substance prescribed during this visit, 12 month history viewed on the [place] prior to issuing an initial prescription for Schedule II or III opiod.    Victorino Dike, FNP 01/14/17 2350    Nance Pear, MD 01/15/17 [phone]

## 2017-01-14 NOTE — Discharge Instructions (Signed)
Rest and elevate your foot. Follow up with the podiatrist for symptoms that aren't improving over the week. Return to the ER for symptoms that change or worsen if unable to schedule an appointment with primary care or the specialist.

## 2017-03-31 ENCOUNTER — Ambulatory Visit (INDEPENDENT_AMBULATORY_CARE_PROVIDER_SITE_OTHER): Payer: Medicare Other | Admitting: Vascular Surgery

## 2017-03-31 ENCOUNTER — Encounter (INDEPENDENT_AMBULATORY_CARE_PROVIDER_SITE_OTHER): Payer: Self-pay | Admitting: Vascular Surgery

## 2017-03-31 VITALS — BP 132/78 | HR 68 | Resp 15 | Ht 62.0 in | Wt 91.0 lb

## 2017-03-31 DIAGNOSIS — M79605 Pain in left leg: Secondary | ICD-10-CM

## 2017-03-31 DIAGNOSIS — I255 Ischemic cardiomyopathy: Secondary | ICD-10-CM | POA: Diagnosis not present

## 2017-03-31 DIAGNOSIS — Z720 Tobacco use: Secondary | ICD-10-CM | POA: Diagnosis not present

## 2017-03-31 DIAGNOSIS — E782 Mixed hyperlipidemia: Secondary | ICD-10-CM | POA: Diagnosis not present

## 2017-03-31 DIAGNOSIS — M79604 Pain in right leg: Secondary | ICD-10-CM | POA: Diagnosis not present

## 2017-03-31 NOTE — Progress Notes (Signed)
Subjective:    Patient ID: Christine [name], female    DOB: 11-05-1945, 71 y.o.   MRN: [id number] Chief Complaint  Patient presents with  . New Patient (Initial Visit)    Painful legs   Patient presents with a chief complaint of bilateral lower extremity pain. Patient was last seen approximately five years ago.  Patient endorses a six month history of worsening bilateral lower extremity pain. Patient experiences a numbness to the lateral aspect of her calves with activity. She seems to experience this most as she walks her dog. Left lower extremity symptoms are worse than right. Patient will have to stop her activity, rest a while as the pain resolves she can start walking again. Patient denies any rest pain or ulceration to the lower extremity. Patient denies any edema to the lower extremity. Patient has an extensive history of breast cancer and chemotherapy treatments. She has a defibrillator. There is a growth on her left breast just underneath the defibrillator. Patient states that this is not cancer. At this time, the patient doesn't engage in any conservative therapy. Patient denies any surgery or trauma to her lower extremity. Patient denies any DVT to the lower extremity. Patient denies any fever, nausea or vomiting.   Review of Systems  Constitutional: Negative.   HENT: Negative.   Eyes: Negative.   Respiratory: Negative.   Cardiovascular:       Lower extremity pain  Gastrointestinal: Negative.   Endocrine: Negative.   Genitourinary: Negative.   Musculoskeletal: Negative.   Skin: Negative.   Allergic/Immunologic: Negative.   Neurological: Negative.   Hematological: Negative.   Psychiatric/Behavioral: Negative.       Objective:   Physical Exam  Constitutional: She is oriented to person, place, and time. She appears well-developed and well-nourished. No distress.  HENT:  Head: Normocephalic and atraumatic.  Eyes: Pupils are equal, round, and reactive to light. Conjunctivae  are normal.  Neck: Normal range of motion.  Cardiovascular: Normal rate, regular rhythm, normal heart sounds and intact distal pulses.   Pulses:      Radial pulses are 2+ on the right side, and 2+ on the left side.       Dorsalis pedis pulses are 1+ on the right side, and 1+ on the left side.       Posterior tibial pulses are 1+ on the right side, and 1+ on the left side.  Pulmonary/Chest: Effort normal.  Musculoskeletal: Normal range of motion. She exhibits no edema.  Neurological: She is alert and oriented to person, place, and time.  Skin: She is not diaphoretic.  Diffuse less than 1 cm varicosities noted to the bilateral lower extremity. There is no stasis dermatitis noted. There is no skin changes. No cellulitis.  Psychiatric: She has a normal mood and affect. Her behavior is normal. Judgment and thought content normal.  Vitals reviewed.  BP 132/78 (BP Location: Right Arm)   Pulse 68   Resp 15   Ht 5\' 2"  (1.575 m)   Wt 91 lb (41.3 kg)   BMI 16.64 kg/m   Past Medical History:  Diagnosis Date  . Allergic rhinitis   . Anxiety   . Breast cancer ([place])   . Chronic systolic heart failure ([place])   . Coronary artery disease   . GERD (gastroesophageal reflux disease)   . Heart murmur   . Hyperlipidemia   . Hypertension   . Implantable defibrillator St Jude    DOI 2006  . MI (myocardial infarction) ([place])   .  Osteoarthritis    Social History   Social History  . Marital status: Divorced    Spouse name: N/A  . Number of children: 1  . Years of education: N/A   Occupational History  . Floral designer     Disabled   Social History Main Topics  . Smoking status: Current Every Day Smoker    Packs/day: 0.50  . Smokeless tobacco: Never Used     Comment: also uses E-cig  . Alcohol use No     Comment: 2-3 times per month  . Drug use: No  . Sexual activity: Not on file   Other Topics Concern  . Not on file   Social History Narrative   Pt is disabled but is hoping to back  to work part time.   Has been divorced twice.   Gets regular exercise.   Past Surgical History:  Procedure Laterality Date  . CHF exacerbation  10/07  . CORONARY STENT PLACEMENT  8/06   3 stents; myocardial infarction  . NM MYOVIEW LTD  1/08   EF 39%, no sx ischemia  . PACEMAKER PLACEMENT  2/07   Defibrillator   Family History  Problem Relation Age of Onset  . Stroke Mother 62  . Heart attack Father   . Coronary artery disease Father   . Heart attack Brother   . Coronary artery disease Other   . Hypertension Other   . Cancer Neg Hx        No breast or colon cancer   Allergies  Allergen Reactions  . Atorvastatin     REACTION: increased LFT's  . Lisinopril     REACTION: cough  . Oxycodone Hcl     REACTION: unspecified  . Simvastatin     REACTION: myalgias      Assessment & Plan:  Patient presents with a chief complaint of bilateral lower extremity pain. Patient was last seen approximately five years ago.  Patient endorses a six month history of worsening bilateral lower extremity pain. Patient experiences a numbness to the lateral aspect of her calves with activity. She seems to experience this most as she walks her dog. Left lower extremity symptoms are worse than right. Patient will have to stop her activity, rest a while as the pain resolves she can start walking again. Patient denies any rest pain or ulceration to the lower extremity. Patient denies any edema to the lower extremity. Patient has an extensive history of breast cancer and chemotherapy treatments. She has a defibrillator. There is a growth on her left breast just underneath the defibrillator. Patient states that this is not cancer. At this time, the patient doesn't engage in any conservative therapy. Patient denies any surgery or trauma to her lower extremity. Patient denies any DVT to the lower extremity. Patient denies any fever, nausea or vomiting.  1. Pain in both lower extremities - New Patient with what  sounds like claudication to the bilateral lower extremity. Patient with multiple risk factors for peripheral artery disease Faint pedal pulses on exam We'll order an ABI to assess if any concerning peripheral artery disease  - VAS [place] ABI WITH/WO TBI; Future  On examination the patient has palpable varicosities to the left lateral calf. Based on this the patient may have venous reflux which is contributing to her discomfort I will order a bilateral lower extremity venous duplex to rule out any reflux. The patient was encouraged to wear graduated compression stockings (20-30 mmHg) on a daily basis. The patient was  instructed to begin wearing the stockings first thing in the morning and removing them in the evening. The patient was instructed specifically not to sleep in the stockings. Prescription given. In addition, behavioral modification including elevation during the day will be initiated. We discussed a lymphedema pump if conventional therapy goes not work.  - VAS [place] LOWER EXTREMITY VENOUS REFLUX; Future  2. Current tobacco use - Stable We had a discussion for approximately 10 minutes regarding the absolute need for smoking cessation due to the deleterious nature of tobacco on the vascular system. We discussed the tobacco use would diminish patency of any intervention, and likely significantly worsen progressio of disease. We discussed multiple agents for quitting including replacement therapy or medications to reduce cravings such as Chantix. The patient voices their understanding of the importance of smoking cessation.  3. Mixed hyperlipidemia - Stable Encouraged good control as its slows the progression of atherosclerotic disease  Current Outpatient Prescriptions on File Prior to Visit  Medication Sig Dispense Refill  . aspirin 81 MG tablet Take 81 mg by mouth daily.    . bisacodyl (DULCOLAX) 5 MG EC tablet Take 5 mg by mouth daily as needed for mild constipation or moderate  constipation.    . carvedilol (COREG) 25 MG tablet TAKE 1 TABLET BY MOUTH TWICE A DAY 60 tablet 2  . Cholecalciferol (VITAMIN D) 2000 UNITS tablet Take 2,000 Units by mouth daily.    . furosemide (LASIX) 40 MG tablet Take 40 mg by mouth as needed.    Marland Kitchen HYDROcodone-acetaminophen (NORCO/VICODIN) 5-325 MG tablet Take 1 tablet by mouth every 6 (six) hours as needed for moderate pain. 30 tablet 0  . lidocaine-prilocaine (EMLA) cream Apply to affected area once 30 g 3  . LORazepam (ATIVAN) 1 MG tablet Take 1 mg by mouth daily as needed for anxiety.    . Multiple Vitamin (MULTIVITAMIN) tablet Take 1 tablet by mouth daily.    Marland Kitchen omeprazole (PRILOSEC) 20 MG capsule Take 20 mg by mouth 2 (two) times daily.      . ondansetron (ZOFRAN) 8 MG tablet Take 1 tablet (8 mg total) by mouth 2 (two) times daily as needed for refractory nausea / vomiting. 30 tablet 1  . rosuvastatin (CRESTOR) 10 MG tablet Take 10 mg by mouth at bedtime.      . traMADol (ULTRAM) 50 MG tablet Take 50 mg by mouth every 6 (six) hours as needed for moderate pain.     . valsartan (DIOVAN) 320 MG tablet Take 320 mg by mouth daily.    . prochlorperazine (COMPAZINE) 10 MG tablet Take 1 tablet (10 mg total) by mouth every 6 (six) hours as needed (Nausea or vomiting). (Patient not taking: Reported on 03/31/2017) 30 tablet 1   No current facility-administered medications on file prior to visit.     There are no Patient Instructions on file for this visit. No Follow-up on file.   Taimi Towe A Valon Glasscock, PA-C

## 2017-04-09 DIAGNOSIS — F331 Major depressive disorder, recurrent, moderate: Secondary | ICD-10-CM | POA: Insufficient documentation

## 2017-04-09 DIAGNOSIS — E7800 Pure hypercholesterolemia, unspecified: Secondary | ICD-10-CM | POA: Insufficient documentation

## 2017-04-10 DIAGNOSIS — R531 Weakness: Secondary | ICD-10-CM | POA: Insufficient documentation

## 2017-05-11 ENCOUNTER — Encounter (INDEPENDENT_AMBULATORY_CARE_PROVIDER_SITE_OTHER): Payer: Medicare Other

## 2017-05-11 ENCOUNTER — Ambulatory Visit (INDEPENDENT_AMBULATORY_CARE_PROVIDER_SITE_OTHER): Payer: Medicare Other | Admitting: Vascular Surgery

## 2017-08-13 ENCOUNTER — Encounter: Payer: Self-pay | Admitting: Internal Medicine

## 2017-08-13 ENCOUNTER — Ambulatory Visit (INDEPENDENT_AMBULATORY_CARE_PROVIDER_SITE_OTHER): Payer: Medicare Other | Admitting: Internal Medicine

## 2017-08-13 VITALS — BP 158/84 | HR 62 | Ht 62.0 in | Wt 88.5 lb

## 2017-08-13 DIAGNOSIS — Z4502 Encounter for adjustment and management of automatic implantable cardiac defibrillator: Secondary | ICD-10-CM | POA: Diagnosis not present

## 2017-08-13 DIAGNOSIS — I255 Ischemic cardiomyopathy: Secondary | ICD-10-CM | POA: Diagnosis not present

## 2017-08-13 NOTE — Patient Instructions (Signed)

## 2017-08-13 NOTE — Progress Notes (Signed)
Patient Care Team: Glendon Axe, MD as PCP - General (Internal Medicine)   HPI  Christine [name] is a 72 y.o. female Seen in followup  with anticipation of generator replacement of her previously implanted and now at [facility].    She has a history of breast cancer and had been discussions ongoing for years as to what to do about her cancer surgery and possible repositioning of her defibrillator.  She is back again today because of considerations for device explantation related to possible mastectomy .  Intercurrently she has seen oncology. She began having drainage from the firm breast mass that has been there now for some years.  She last saw oncology about a year ago.    Echocardiogram 2012 demonstrated impaired LV function, i.e. 25-30% with apical thrombus.     Echocardiogram 9/15 demonstrated ejection fraction of 30-35% with a mural thrombus. No stalk was described. Myoview scan demonstrated similar ejection fraction with inferobasilar ischemia and a large infarct.   She is now having blood drainage frequently from her mass.  The comment from oncology was "[prob wrong diagnosis in the beginning, otherwise you would have been dead"  Stable SOB  No angina or edema.    Past Medical History:  Diagnosis Date  . Allergic rhinitis   . Anxiety   . Breast cancer ([place])   . Chronic systolic heart failure ([place])   . Coronary artery disease   . GERD (gastroesophageal reflux disease)   . Heart murmur   . Hyperlipidemia   . Hypertension   . Implantable defibrillator St Jude    DOI 2006  . MI (myocardial infarction) ([place])   . Osteoarthritis     Past Surgical History:  Procedure Laterality Date  . CHF exacerbation  10/07  . CORONARY STENT PLACEMENT  8/06   3 stents; myocardial infarction  . NM MYOVIEW LTD  1/08   EF 39%, no sx ischemia  . PACEMAKER PLACEMENT  2/07   Defibrillator    Current Outpatient Medications  Medication Sig Dispense Refill  . albuterol (VENTOLIN  HFA) 108 (90 Base) MCG/ACT inhaler INHALE 2 PUFFS BY MOUTH 4 TIMES A DAY ASNEEDED    . aspirin 81 MG tablet Take 81 mg by mouth daily.    . bisacodyl (DULCOLAX) 5 MG EC tablet Take 5 mg by mouth daily as needed for mild constipation or moderate constipation.    . carvedilol (COREG) 25 MG tablet TAKE 1 TABLET BY MOUTH TWICE A DAY 60 tablet 2  . cetirizine (ZYRTEC) 10 MG tablet TAKE 1 TABLET BY MOUTH ONCE DAILY    . Cholecalciferol (VITAMIN D) 2000 UNITS tablet Take 2,000 Units by mouth daily.    Marland Kitchen dicyclomine (BENTYL) 20 MG tablet Take by mouth.    . furosemide (LASIX) 40 MG tablet Take 40 mg by mouth as needed.    Marland Kitchen HYDROcodone-acetaminophen (NORCO/VICODIN) 5-325 MG tablet Take 1 tablet by mouth every 6 (six) hours as needed for moderate pain. 30 tablet 0  . lidocaine-prilocaine (EMLA) cream Apply to affected area once 30 g 3  . loratadine (CLARITIN) 10 MG tablet Take by mouth.    Marland Kitchen LORazepam (ATIVAN) 0.5 MG tablet Take 0.5 mg by mouth 2 (two) times daily.     . Multiple Vitamin (MULTIVITAMIN) tablet Take 1 tablet by mouth daily.    . Omega-3 Fatty Acids (OMEGA 3 PO) Take by mouth daily.    Marland Kitchen omeprazole (PRILOSEC) 20 MG capsule Take 20 mg by  mouth 2 (two) times daily.      . pantoprazole (PROTONIX) 40 MG tablet TAKE 1 TABLET BY MOUTH ONCE DAILY    . rosuvastatin (CRESTOR) 10 MG tablet Take 10 mg by mouth at bedtime.      . traMADol (ULTRAM) 50 MG tablet Take 50 mg by mouth every 6 (six) hours as needed for moderate pain.     . valsartan (DIOVAN) 320 MG tablet Take 320 mg by mouth daily.     No current facility-administered medications for this visit.     Allergies  Allergen Reactions  . Atorvastatin     REACTION: increased LFT's  . Lisinopril     REACTION: cough  . Oxycodone Hcl     REACTION: unspecified  . Simvastatin     REACTION: myalgias    Review of Systems negative except from HPI and PMH  Physical Exam BP (!) 158/84 (BP Location: Left Arm, Patient Position: Sitting, Cuff  Size: Normal)   Pulse 62   Ht 5\' 2"  (1.575 m)   Wt 88 lb 8 oz (40.1 kg)   BMI 16.19 kg/m   Well developed and nourished in no acute distress HENT normal Neck supple with JVP-flat Carotids brisk and full without bruits There is erosion with blood at the bottom of her left medial breast. Clear Regular rate and rhythm, no murmurs or gallops Abd-soft with active BS without hepatomegaly No Clubbing cyanosis edema Skin-warm and dry A & Oriented  Grossly normal sensory and motor function    ECG demonstrates sinus at 62 Interval 14/09/45 Axis (65 inferior wall MI Anterior wall MI  Assessment and  Plan  Ischemic cardiomyopathy  Breast cancer  Implantable defibrillator at EOS Dual chamber   Hypertension    Chart reviewed and discussed with Dr Lucia Gaskins,  Notes describe R cancer, but op note and findings point to L  The question now is what comprises the source of the drainage,  Is it "cancer" as defined by path ER/PR +; does the mass and the erosion include the device pocket?  I have arranged consultation with Dr Lucia Gaskins to beg his assistance in trying to sort out what is next to do  More than 50% of 40  min was spent in counseling related to the above

## 2017-08-18 ENCOUNTER — Other Ambulatory Visit: Payer: Self-pay | Admitting: Surgery

## 2017-08-25 ENCOUNTER — Inpatient Hospital Stay: Payer: Medicare Other | Admitting: Oncology

## 2017-08-27 ENCOUNTER — Telehealth: Payer: Self-pay | Admitting: Internal Medicine

## 2017-08-27 NOTE — Telephone Encounter (Signed)
I left a message at the patient's home # to please call in regards to DOT paperwork that was received.   Attempted to call her work #- no answer/ unable to leave a message.

## 2017-08-31 NOTE — Telephone Encounter (Signed)
I spoke with the patient and advised her that the page of her DMV paperwork that was left is not filled out by Dr. Caryl Comes- he had already completed the cardiovascular section of the paperwork. The front page needs to be completed by her PCP.  She voices understanding and will come pick up the blank form today.

## 2017-09-07 ENCOUNTER — Telehealth: Payer: Self-pay | Admitting: Oncology

## 2017-09-07 NOTE — Telephone Encounter (Signed)
Ok, can you please send a note to [facility], they referred her back to [place].

## 2017-09-07 NOTE — Telephone Encounter (Signed)
Called pt to r\s missed appt from 3/12 and patient is having a very tough month and her Medicaid is running out and she just can't seen to get together. She promises to call back in April to r\s missed appt.

## 2017-09-28 NOTE — Telephone Encounter (Signed)
I left a message for the patient to call. 

## 2017-09-28 NOTE — Telephone Encounter (Signed)
Patient calling back to discuss this with [facility].  Patient was told she has to have this one paper before 4/21 or she loses dl .  Patient wants to let us know dmv did not get a form with [place]. Patient states Christine [name] has this form.  Please call.

## 2017-09-29 NOTE — Telephone Encounter (Signed)
Patient called back today- I was in clinic, but advised Hiarra to please make her aware I do not have any forms for her. I discussed this with her before that the form she was referring to, cardiology does not sign.  This was a DMV form- Dr. Caryl Comes completes on the the cardiology section and has already done so and given this to her. The other form will need to be completed by her PCP.

## 2017-10-06 NOTE — Telephone Encounter (Signed)
Patient calling in regards to the paperwork from [facility] Would like to speak with Nira Conn Please call

## 2017-10-06 NOTE — Telephone Encounter (Signed)
I attempted to call the patient back at her work #- no answer/ no voice mail. I left a message for her to call at her home #.

## 2017-10-12 NOTE — Telephone Encounter (Signed)
I spoke with the patient regarding her DMV forms. She states her PCP will not fill out the front page of the forms. She is aware that we do not typically fill this information out, but just the cardiology section. I have advised her to bring the form up here and I will review with Dr. Caryl Comes, but she is aware that there is a lot of information on that page that he may not be able to complete, but we will make a notation of that on the form. I have asked if she can bring the form this week, prior to Thursday as Dr. Caryl Comes will potentially be out of the office here until 5/14 after Thursday this week. She states she is having car trouble, but will try to get the form here this week.

## 2017-10-12 NOTE — Telephone Encounter (Signed)
Patient dropped off forms placed in nurse box.

## 2017-10-12 NOTE — Telephone Encounter (Signed)
Paper work pulled for Dr. Caryl Comes to review.

## 2017-10-13 NOTE — Telephone Encounter (Signed)
Paper work completed by Dr. Caryl Comes. The patient is aware.  Copy faxed to the [facility]- DOT/ DMV at [phone]. Confirmation received. Original copy mailed to: [facility]- DOT/DMV Hearings Section- Medical Review Unit [place], [place] [id number]

## 2017-12-13 NOTE — Progress Notes (Deleted)
Christine [name]  [phone] [phone]  ID: Christine [name] OB: 17-Jul-1945  MR#: [id number]  BMW#:[id number]  Patient Care Team: Glendon Axe, MD as PCP - General (Internal Medicine)  CHIEF COMPLAINT: Clinical stage IIIB ER/PR positive, HER-2 negative invasive carcinoma of the upper inner quadrant of the right breast.  INTERVAL HISTORY: Patient returns to clinic today for further evaluation and to assess her toleration of cycle 1 of 4 of neoadjuvant Taxotere and Cytoxan. She continues to be anxious, but otherwise feels well. She tolerated her chemotherapy well without significant side effects. She had some bony pain for several days after her Neulasta, but this has resolved. The lesion on her breast continues to drain serosanguineous fluid. She has no neurologic complaints. She denies any recent fevers or illnesses. She has a good appetite and denies weight loss. She has some breast tenderness, but denies any chest pain or shortness of breath. She has no nausea, vomiting, constipation, or diarrhea. She has no urinary complaints. Patient otherwise feels well and offers no further specific complaints.  REVIEW OF SYSTEMS:   Review of Systems  Constitutional: Negative.  Negative for fever, malaise/fatigue and weight loss.  Respiratory: Negative.  Negative for cough and shortness of breath.   Cardiovascular: Negative.  Negative for chest pain and leg swelling.  Gastrointestinal: Negative.  Negative for abdominal pain.  Genitourinary: Negative.   Musculoskeletal: Negative.   Neurological: Negative.  Negative for weakness.  Psychiatric/Behavioral: The patient is nervous/anxious.     As per HPI. Otherwise, a complete review of systems is negative.  PAST MEDICAL HISTORY: Past Medical History:  Diagnosis Date  . Allergic rhinitis   . Anxiety   . Breast cancer ([place])   . Chronic systolic heart failure ([place])   . Coronary artery disease   . GERD  (gastroesophageal reflux disease)   . Heart murmur   . Hyperlipidemia   . Hypertension   . Implantable defibrillator St Jude    DOI 2006  . MI (myocardial infarction) ([place])   . Osteoarthritis     PAST SURGICAL HISTORY: Past Surgical History:  Procedure Laterality Date  . CHF exacerbation  10/07  . CORONARY STENT PLACEMENT  8/06   3 stents; myocardial infarction  . NM MYOVIEW LTD  1/08   EF 39%, no sx ischemia  . PACEMAKER PLACEMENT  2/07   Defibrillator    FAMILY HISTORY: Family History  Problem Relation Age of Onset  . Stroke Mother 36  . Heart attack Father   . Coronary artery disease Father   . Heart attack Brother   . Coronary artery disease Other   . Hypertension Other   . Cancer Neg Hx        No breast or colon cancer    ADVANCED DIRECTIVES (Y/N):  N  HEALTH MAINTENANCE: Social History   Tobacco Use  . Smoking status: Current Every Day Smoker    Packs/day: 0.50  . Smokeless tobacco: Never Used  . Tobacco comment: also uses E-cig  Substance Use Topics  . Alcohol use: No    Comment: 2-3 times per month  . Drug use: No     Colonoscopy:  PAP:  Bone density:  Lipid panel:  Allergies  Allergen Reactions  . Atorvastatin     REACTION: increased LFT's  . Lisinopril     REACTION: cough  . Oxycodone Hcl     REACTION: unspecified  . Simvastatin     REACTION: myalgias    Current Outpatient Medications  Medication Sig Dispense Refill  . albuterol (VENTOLIN HFA) 108 (90 Base) MCG/ACT inhaler INHALE 2 PUFFS BY MOUTH 4 TIMES A DAY ASNEEDED    . aspirin 81 MG tablet Take 81 mg by mouth daily.    . bisacodyl (DULCOLAX) 5 MG EC tablet Take 5 mg by mouth daily as needed for mild constipation or moderate constipation.    . carvedilol (COREG) 25 MG tablet TAKE 1 TABLET BY MOUTH TWICE A DAY 60 tablet 2  . cetirizine (ZYRTEC) 10 MG tablet TAKE 1 TABLET BY MOUTH ONCE DAILY    . Cholecalciferol (VITAMIN D) 2000 UNITS tablet Take 2,000 Units by mouth daily.    Marland Kitchen  dicyclomine (BENTYL) 20 MG tablet Take by mouth.    . furosemide (LASIX) 40 MG tablet Take 40 mg by mouth as needed.    Marland Kitchen HYDROcodone-acetaminophen (NORCO/VICODIN) 5-325 MG tablet Take 1 tablet by mouth every 6 (six) hours as needed for moderate pain. 30 tablet 0  . lidocaine-prilocaine (EMLA) cream Apply to affected area once 30 g 3  . loratadine (CLARITIN) 10 MG tablet Take by mouth.    Marland Kitchen LORazepam (ATIVAN) 0.5 MG tablet Take 0.5 mg by mouth 2 (two) times daily.     . Multiple Vitamin (MULTIVITAMIN) tablet Take 1 tablet by mouth daily.    . Omega-3 Fatty Acids (OMEGA 3 PO) Take by mouth daily.    Marland Kitchen omeprazole (PRILOSEC) 20 MG capsule Take 20 mg by mouth 2 (two) times daily.      . pantoprazole (PROTONIX) 40 MG tablet TAKE 1 TABLET BY MOUTH ONCE DAILY    . rosuvastatin (CRESTOR) 10 MG tablet Take 10 mg by mouth at bedtime.      . traMADol (ULTRAM) 50 MG tablet Take 50 mg by mouth every 6 (six) hours as needed for moderate pain.     . valsartan (DIOVAN) 320 MG tablet Take 320 mg by mouth daily.     No current facility-administered medications for this visit.     OBJECTIVE: There were no vitals filed for this visit.   There is no height or weight on file to calculate BMI.    ECOG FS:0 - Asymptomatic  General: Well-developed, well-nourished, no acute distress. Eyes: Pink conjunctiva, anicteric sclera. Breasts: Approximately 2-3 cm mass in the upper inner quadrant with skin ulceration. Easily palpable 1 cm axillary lymph node. Lungs: Clear to auscultation bilaterally. Heart: Regular rate and rhythm. No rubs, murmurs, or gallops. Abdomen: Soft, nontender, nondistended. No organomegaly noted, normoactive bowel sounds. Musculoskeletal: No edema, cyanosis, or clubbing. Neuro: Alert, answering all questions appropriately. Cranial nerves grossly intact. Skin: No rashes or petechiae noted. Psych: Normal affect.   LAB RESULTS:  Lab Results  Component Value Date   NA 140 08/12/2016   K 3.4  (L) 08/12/2016   CL 105 08/12/2016   CO2 28 08/12/2016   GLUCOSE 102 (H) 08/12/2016   BUN <5 (L) 08/12/2016   CREATININE 0.77 08/12/2016   CALCIUM 8.5 (L) 08/12/2016   PROT 6.3 (L) 08/12/2016   ALBUMIN 3.7 08/12/2016   AST 26 08/12/2016   ALT 16 08/12/2016   ALKPHOS 155 (H) 08/12/2016   BILITOT 0.6 08/12/2016   GFRNONAA >60 08/12/2016   GFRAA >60 08/12/2016    Lab Results  Component Value Date   WBC 25.2 (H) 08/12/2016   NEUTROABS 22.0 (H) 08/12/2016   HGB 11.5 (L) 08/12/2016   HCT 34.1 (L) 08/12/2016   MCV 87.5 08/12/2016   PLT 129 (L) 08/12/2016  STUDIES: No results found.  ASSESSMENT: Clinical stage IIIB ER/PR positive, HER-2 negative invasive carcinoma of the upper inner quadrant of the right breast.  PLAN:    1. Clinical stage IIIB ER/PR positive, HER-2 negative invasive carcinoma of the upper inner quadrant of the right breast: Patient's initial diagnosis was at least in September 2012. She received one dose of chemotherapy using Taxotere and Cytoxan in 2013. She did not receive any XRT or hormonal treatment. PET scan results from July 08, 2016 reviewed independently possibly suggesting metastatic lesions in her lung, but these are too small to characterize. Case discussed with surgery and it was agreed upon to use neoadjuvant chemotherapy to shrink her lesion and possibly make mastectomy easier. Goal will be to shrink tumor away from her defibrillator. Patient already has a port in place from her previous attempt at treatment. At the conclusion of all her treatments, patient will benefit from an aromatase inhibitor for a minimum of 5 years. Patient tolerated cycle 1 of 4 of Taxotere and Cytoxan without significant side effects. Patient will also receive OnPro Neulasta with each cycle. Return to clinic in 1 week for consideration of cycle 2. 2. Defibrillator: [facility] cardiology input. Will coordinate removal and/or replacement of defibrillator at time of  mastectomy.  Patient expressed understanding and was in agreement with this plan. She also understands that She can call clinic at any time with any questions, concerns, or complaints.   Cancer Staging Breast cancer of upper-inner quadrant of right female breast [facility]) Staging form: Breast, AJCC 8th Edition - Clinical: Stage IIIB (cT4b, cN1, cM0, G2, ER: Positive, PR: Positive, HER2: Negative) - Signed by Lloyd Huger, MD on 07/01/2016   Lloyd Huger, MD   12/13/2017 11:35 PM

## 2017-12-14 ENCOUNTER — Inpatient Hospital Stay: Payer: Medicare Other | Admitting: Oncology

## 2017-12-15 ENCOUNTER — Encounter: Payer: Self-pay | Admitting: Cardiology

## 2018-01-16 DIAGNOSIS — L98492 Non-pressure chronic ulcer of skin of other sites with fat layer exposed: Secondary | ICD-10-CM | POA: Insufficient documentation

## 2018-01-25 ENCOUNTER — Other Ambulatory Visit: Payer: Self-pay

## 2018-01-25 ENCOUNTER — Emergency Department
Admission: EM | Admit: 2018-01-25 | Discharge: 2018-01-25 | Disposition: A | Discharge status: Home / Self Care | Payer: Medicare Other | Attending: Emergency Medicine | Admitting: Emergency Medicine

## 2018-01-25 ENCOUNTER — Encounter: Payer: Self-pay | Admitting: Emergency Medicine

## 2018-01-25 DIAGNOSIS — E785 Hyperlipidemia, unspecified: Secondary | ICD-10-CM | POA: Insufficient documentation

## 2018-01-25 DIAGNOSIS — I2510 Atherosclerotic heart disease of native coronary artery without angina pectoris: Secondary | ICD-10-CM | POA: Diagnosis not present

## 2018-01-25 DIAGNOSIS — F17200 Nicotine dependence, unspecified, uncomplicated: Secondary | ICD-10-CM | POA: Diagnosis not present

## 2018-01-25 DIAGNOSIS — J449 Chronic obstructive pulmonary disease, unspecified: Secondary | ICD-10-CM | POA: Diagnosis not present

## 2018-01-25 DIAGNOSIS — C50912 Malignant neoplasm of unspecified site of left female breast: Secondary | ICD-10-CM | POA: Diagnosis present

## 2018-01-25 DIAGNOSIS — I110 Hypertensive heart disease with heart failure: Secondary | ICD-10-CM | POA: Insufficient documentation

## 2018-01-25 DIAGNOSIS — Z7982 Long term (current) use of aspirin: Secondary | ICD-10-CM | POA: Diagnosis not present

## 2018-01-25 DIAGNOSIS — Z95810 Presence of automatic (implantable) cardiac defibrillator: Secondary | ICD-10-CM | POA: Diagnosis not present

## 2018-01-25 DIAGNOSIS — I252 Old myocardial infarction: Secondary | ICD-10-CM | POA: Insufficient documentation

## 2018-01-25 DIAGNOSIS — Z79899 Other long term (current) drug therapy: Secondary | ICD-10-CM | POA: Diagnosis not present

## 2018-01-25 DIAGNOSIS — I5022 Chronic systolic (congestive) heart failure: Secondary | ICD-10-CM | POA: Diagnosis not present

## 2018-01-25 DIAGNOSIS — Z5189 Encounter for other specified aftercare: Secondary | ICD-10-CM

## 2018-01-25 MED ORDER — CEPHALEXIN 500 MG PO CAPS: 500.0000 mg | ORAL_CAPSULE | Freq: Two times a day (BID) | ORAL | 0 refills | Status: DC

## 2018-01-25 NOTE — ED Triage Notes (Signed)
Pt states piercing pain in her left breast, states she has an infected fatty ulcer on her left breast, was told to come here for [place] to do a work up this am when she called the cardiology after hours line. No distress at this time.

## 2018-01-25 NOTE — ED Provider Notes (Signed)
[facility] Emergency Department Provider Note   ____________________________________________    I have reviewed the triage vital signs and the nursing notes.   HISTORY  Chief Complaint Wound Check     HPI Christine [name] is a 72 y.o. female who presents with complaints of pain in her left breast.  Patient notes a history of breast cancer in the left breast chronic ulceration, she states that at one point surgery was considered but was not done because she could not find someone to remove the defibrillator which I find curious.  She does report getting chemotherapy a year ago.  She is here because she has had an increased burning sensation and is concerned that it may be  infected  Past Medical History:  Diagnosis Date  . Allergic rhinitis   . Anxiety   . Breast cancer ([place])   . Chronic systolic heart failure ([place])   . Coronary artery disease   . GERD (gastroesophageal reflux disease)   . Heart murmur   . Hyperlipidemia   . Hypertension   . Implantable defibrillator St Jude    DOI 2006  . MI (myocardial infarction) ([place])   . Osteoarthritis     Patient Active Problem List   Diagnosis Date Noted  . Pain in both lower extremities 03/31/2017  . Breast cancer of upper-inner quadrant of right female breast ([place]) 07/01/2016  . Moderate single current episode of major depressive disorder ([place]) 07/16/2015  . Headache 10/31/2014  . Chronic obstructive pulmonary disease ([place]) 04/05/2014  . Current tobacco use 04/05/2014  . Anxiety 11/25/2013  . Breast cancer ([place]) 11/25/2013  . Depression 11/25/2013  . Thrombus 03/17/2011  . CARDIOMYOPATHY, ISCHEMIC 11/09/2008  . ICD - IN SITU 11/09/2008  . ANEMIA 07/30/2007  . ABNORMAL THYROID FUNCTION TESTS 07/30/2007  . Mixed hyperlipidemia 11/16/2006  . Essential hypertension 11/16/2006  . Coronary atherosclerosis 11/16/2006  . FAILURE, SYSTOLIC HEART, CHRONIC 50/35/4656  . ALLERGIC RHINITIS 11/16/2006  .  GERD 11/16/2006  . Primary osteoarthritis involving multiple joints 11/16/2006  . BACK PAIN 11/16/2006    Past Surgical History:  Procedure Laterality Date  . CHF exacerbation  10/07  . CORONARY STENT PLACEMENT  8/06   3 stents; myocardial infarction  . NM MYOVIEW LTD  1/08   EF 39%, no sx ischemia  . PACEMAKER PLACEMENT  2/07   Defibrillator    Prior to Admission medications   Medication Sig Start Date End Date Taking? Authorizing Provider  albuterol (VENTOLIN HFA) 108 (90 Base) MCG/ACT inhaler INHALE 2 PUFFS BY MOUTH 4 TIMES A DAY ASNEEDED 03/31/17   [provider]  aspirin 81 MG tablet Take 81 mg by mouth daily.    [provider]  bisacodyl (DULCOLAX) 5 MG EC tablet Take 5 mg by mouth daily as needed for mild constipation or moderate constipation.    [provider]  carvedilol (COREG) 25 MG tablet TAKE 1 TABLET BY MOUTH TWICE A DAY 09/09/10   Viviana Simpler I, MD  cephALEXin (KEFLEX) 500 MG capsule Take 1 capsule (500 mg total) by mouth 2 (two) times daily. 01/25/18   Lavonia Drafts, MD  cetirizine (ZYRTEC) 10 MG tablet TAKE 1 TABLET BY MOUTH ONCE DAILY 02/13/17   [provider]  Cholecalciferol (VITAMIN D) 2000 UNITS tablet Take 2,000 Units by mouth daily.    [provider]  dicyclomine (BENTYL) 20 MG tablet Take by mouth. 11/28/16 11/28/17  [provider]  furosemide (LASIX) 40 MG tablet Take  40 mg by mouth as needed. 07/22/16   [provider]  HYDROcodone-acetaminophen (NORCO/VICODIN) 5-325 MG tablet Take 1 tablet by mouth every 6 (six) hours as needed for moderate pain. 08/12/16   Lloyd Huger, MD  lidocaine-prilocaine (EMLA) cream Apply to affected area once 07/18/16   Lloyd Huger, MD  loratadine (CLARITIN) 10 MG tablet Take by mouth.    [provider]  LORazepam (ATIVAN) 0.5 MG tablet Take 0.5 mg by mouth 2 (two) times daily.  03/13/17   [provider]  Multiple Vitamin  (MULTIVITAMIN) tablet Take 1 tablet by mouth daily.    [provider]  Omega-3 Fatty Acids (OMEGA 3 PO) Take by mouth daily.    [provider]  omeprazole (PRILOSEC) 20 MG capsule Take 20 mg by mouth 2 (two) times daily.      [provider]  pantoprazole (PROTONIX) 40 MG tablet TAKE 1 TABLET BY MOUTH ONCE DAILY 02/25/17   [provider]  rosuvastatin (CRESTOR) 10 MG tablet Take 10 mg by mouth at bedtime.      [provider]  traMADol (ULTRAM) 50 MG tablet Take 50 mg by mouth every 6 (six) hours as needed for moderate pain.     [provider]  valsartan (DIOVAN) 320 MG tablet Take 320 mg by mouth daily.    [provider]     Allergies Atorvastatin; Lisinopril; Oxycodone hcl; and Simvastatin  Family History  Problem Relation Age of Onset  . Stroke Mother 17  . Heart attack Father   . Coronary artery disease Father   . Heart attack Brother   . Coronary artery disease Other   . Hypertension Other   . Cancer Neg Hx        No breast or colon cancer    Social History Social History   Tobacco Use  . Smoking status: Current Every Day Smoker    Packs/day: 0.50  . Smokeless tobacco: Never Used  . Tobacco comment: also uses E-cig  Substance Use Topics  . Alcohol use: No    Comment: 2-3 times per month  . Drug use: No    Review of Systems  Constitutional: No fever/chills Eyes: No visual changes.  ENT: No sore throat. Cardiovascular: Denies chest pain. Respiratory: Denies shortness of breath. Gastrointestinal: No abdominal pain.   Genitourinary: Negative for dysuria. Musculoskeletal: Negative for back pain. Skin: Breast ulceration Neurological: Negative for headaches    ____________________________________________   PHYSICAL EXAM:  VITAL SIGNS: ED Triage Vitals  Enc Vitals Group     BP 01/25/18 0905 (!) 110/59     Pulse Rate 01/25/18 0905 60     Resp 01/25/18 0905 16     Temp 01/25/18 0905 97.9 F  (36.6 C)     Temp Source 01/25/18 0905 Oral     SpO2 01/25/18 0905 98 %     Weight 01/25/18 0907 40.3 kg (88 lb 12.8 oz)     Height 01/25/18 0907 1.575 m (5\' 2" )     Head Circumference --      Peak Flow --      Pain Score 01/25/18 0906 0     Pain Loc --      Pain Edu? --      Excl. in [place]? --     Constitutional: Alert and oriented. No acute distress. Pleasant and interactive  Nose: No congestion/rhinnorhea. Mouth/Throat: Mucous membranes are moist.    Cardiovascular: Normal rate, regular rhythm. Grossly normal heart sounds.  Good peripheral circulation. Respiratory: Normal respiratory effort.  No retractions. Lungs CTAB.  Musculoskeletal: No lower extremity tenderness nor edema.  Warm and well perfused Neurologic:  Normal speech and language. No gross focal neurologic deficits are appreciated.  Skin:  Skin is warm, dry.  Left breast: Significant ulceration approximately 3 x 3 cm, circular nipple displaced inferiorly, no evidence of erythema or discharge Psychiatric: Mood and affect are normal. Speech and behavior are normal.  ____________________________________________   LABS (all labs ordered are listed, but only abnormal results are displayed)  Labs Reviewed - No data to display ____________________________________________  EKG  ED ECG REPORT I, Lavonia Drafts, the attending physician, personally viewed and interpreted this ECG.  Date: 01/25/2018  Rhythm: normal sinus rhythm QRS Axis: normal Intervals: normal ST/T Wave abnormalities: normal Narrative Interpretation: no evidence of acute ischemia  ____________________________________________  RADIOLOGY  None ____________________________________________   PROCEDURES  Procedure(s) performed: No  Procedures   Critical Care performed: No ____________________________________________   INITIAL IMPRESSION / ASSESSMENT AND PLAN / ED COURSE  Pertinent labs & imaging results that were available during my  care of the patient were reviewed by me and considered in my medical decision making (see chart for details).  Patient seems to have somewhat poor insight into her condition.  I discussed with Dr. Grayland Ormond of oncology, he recommends strongly encouraging her to follow-up with him.  I discussed this with her and she states that she will do so ASAP.  We will cover her with antibiotics for possible infection    ____________________________________________   FINAL CLINICAL IMPRESSION(S) / ED DIAGNOSES  Final diagnoses:  Visit for wound check  Malignant neoplasm of left female breast, unspecified estrogen receptor status, unspecified site of breast [facility])        Note:  This document was prepared using Dragon voice recognition software and may include unintentional dictation errors.    Lavonia Drafts, MD 01/25/18 [phone]

## 2018-01-25 NOTE — Discharge Instructions (Addendum)
It is very important that you follow-up with Dr. Grayland Ormond of the cancer center

## 2018-01-26 ENCOUNTER — Telehealth: Payer: Self-pay | Admitting: Oncology

## 2018-01-26 NOTE — Telephone Encounter (Signed)
Left pt Vm to contact office to schedule Hospital Follow-up appt.

## 2018-02-04 ENCOUNTER — Ambulatory Visit: Payer: Medicare Other | Admitting: Nurse Practitioner

## 2018-02-07 NOTE — Progress Notes (Signed)
[place]  [phone] [phone]  ID: Christine [name] OB: 10-Jan-1946  MR#: [id number]  GGE#:[id number]  Patient Care Team: Glendon Axe, MD as PCP - General (Internal Medicine)  CHIEF COMPLAINT: Clinical stage IIIB ER/PR positive, HER-2 negative invasive carcinoma of the upper inner quadrant of the right breast.  INTERVAL HISTORY: Patient last evaluated in clinic in February 2018.  In the interim she has skipped multiple follow-up appointments.  She has been evaluated surgery, but no intervention has been done with her above-stated breast cancer.  She now has ulceration through her skin which is tender.  She otherwise feels well.  She has no neurologic complaints. She denies any recent fevers or illnesses. She has a good appetite and denies weight loss.  She denies any chest pain, hemoptysis, cough, or shortness of breath.  She has no nausea, vomiting, constipation, or diarrhea. She has no urinary complaints.  Patient offers no further specific complaints today.  REVIEW OF SYSTEMS:   Review of Systems  Constitutional: Negative.  Negative for fever, malaise/fatigue and weight loss.  Respiratory: Negative.  Negative for cough and shortness of breath.   Cardiovascular: Negative.  Negative for chest pain and leg swelling.  Gastrointestinal: Negative.  Negative for abdominal pain.  Genitourinary: Negative.  Negative for dysuria.  Musculoskeletal: Negative.  Negative for back pain.  Skin: Negative.  Negative for rash.  Neurological: Negative.  Negative for dizziness, focal weakness, weakness and headaches.  Psychiatric/Behavioral: The patient is nervous/anxious.     As per HPI. Otherwise, a complete review of systems is negative.  PAST MEDICAL HISTORY: Past Medical History:  Diagnosis Date  . Allergic rhinitis   . Anxiety   . Breast cancer ([place])   . Chronic systolic heart failure ([place])   . Coronary artery disease   . GERD (gastroesophageal  reflux disease)   . Heart murmur   . Hyperlipidemia   . Hypertension   . Implantable defibrillator St Jude    DOI 2006  . MI (myocardial infarction) ([place])   . Osteoarthritis     PAST SURGICAL HISTORY: Past Surgical History:  Procedure Laterality Date  . CHF exacerbation  10/07  . CORONARY STENT PLACEMENT  8/06   3 stents; myocardial infarction  . NM MYOVIEW LTD  1/08   EF 39%, no sx ischemia  . PACEMAKER PLACEMENT  2/07   Defibrillator    FAMILY HISTORY: Family History  Problem Relation Age of Onset  . Stroke Mother 63  . Heart attack Father   . Coronary artery disease Father   . Heart attack Brother   . Coronary artery disease Other   . Hypertension Other   . Cancer Neg Hx        No breast or colon cancer    ADVANCED DIRECTIVES (Y/N):  N  HEALTH MAINTENANCE: Social History   Tobacco Use  . Smoking status: Current Every Day Smoker    Packs/day: 0.50  . Smokeless tobacco: Never Used  . Tobacco comment: also uses E-cig  Substance Use Topics  . Alcohol use: No    Comment: 2-3 times per month  . Drug use: No     Colonoscopy:  PAP:  Bone density:  Lipid panel:  Allergies  Allergen Reactions  . Atorvastatin     REACTION: increased LFT's  . Lisinopril     REACTION: cough  . Oxycodone Hcl     REACTION: unspecified  . Simvastatin     REACTION: myalgias    Current Outpatient  Medications  Medication Sig Dispense Refill  . aspirin 81 MG tablet Take 81 mg by mouth daily.    . carvedilol (COREG) 25 MG tablet TAKE 1 TABLET BY MOUTH TWICE A DAY 60 tablet 2  . cetirizine (ZYRTEC) 10 MG tablet TAKE 1 TABLET BY MOUTH ONCE DAILY    . Cholecalciferol (VITAMIN D) 2000 UNITS tablet Take 2,000 Units by mouth daily.    Marland Kitchen LORazepam (ATIVAN) 0.5 MG tablet Take 0.5 mg by mouth 2 (two) times daily.     . pantoprazole (PROTONIX) 40 MG tablet TAKE 1 TABLET BY MOUTH ONCE DAILY    . rosuvastatin (CRESTOR) 10 MG tablet Take 10 mg by mouth at bedtime.      . traMADol  (ULTRAM) 50 MG tablet Take 50 mg by mouth every 6 (six) hours as needed for moderate pain.     . valsartan (DIOVAN) 320 MG tablet Take 320 mg by mouth daily.    Marland Kitchen albuterol (VENTOLIN HFA) 108 (90 Base) MCG/ACT inhaler INHALE 2 PUFFS BY MOUTH 4 TIMES A DAY ASNEEDED    . bisacodyl (DULCOLAX) 5 MG EC tablet Take 5 mg by mouth daily as needed for mild constipation or moderate constipation.    . dicyclomine (BENTYL) 20 MG tablet Take by mouth.    . furosemide (LASIX) 40 MG tablet Take 40 mg by mouth as needed.    Marland Kitchen HYDROcodone-acetaminophen (NORCO/VICODIN) 5-325 MG tablet Take 1 tablet by mouth every 6 (six) hours as needed for moderate pain. (Patient not taking: Reported on 02/08/2018) 30 tablet 0  . letrozole (FEMARA) 2.5 MG tablet Take 1 tablet (2.5 mg total) by mouth daily. 90 tablet 3  . lidocaine-prilocaine (EMLA) cream Apply to affected area once (Patient not taking: Reported on 02/08/2018) 30 g 3  . loratadine (CLARITIN) 10 MG tablet Take by mouth.    . Multiple Vitamin (MULTIVITAMIN) tablet Take 1 tablet by mouth daily.    . Omega-3 Fatty Acids (OMEGA 3 PO) Take by mouth daily.    Marland Kitchen omeprazole (PRILOSEC) 20 MG capsule Take 20 mg by mouth 2 (two) times daily.      . Polyethylene Glycol 3350 (PEG 3350) POWD Take by mouth.     No current facility-administered medications for this visit.     OBJECTIVE: Vitals:   02/08/18 1045  BP: 111/61  Pulse: (!) 53  Resp: 18  Temp: (!) 94.4 F (34.7 C)     Body mass index is 16.41 kg/m.    ECOG FS:0 - Asymptomatic  General: Well-developed, well-nourished, no acute distress. Eyes: Pink conjunctiva, anicteric sclera. HEENT: Normocephalic, moist mucous membranes, clear oropharnyx. Breasts: Approximately 3 to 4 cm mass progressed since last evaluation with significant skin ulceration and breakdown.  He is a palpable axillary lymphadenopathy. Lungs: Clear to auscultation bilaterally. Heart: Regular rate and rhythm. No rubs, murmurs, or  gallops. Abdomen: Soft, nontender, nondistended. No organomegaly noted, normoactive bowel sounds. Musculoskeletal: No edema, cyanosis, or clubbing. Neuro: Alert, answering all questions appropriately. Cranial nerves grossly intact. Skin: No rashes or petechiae noted. Psych: Normal affect.   LAB RESULTS:  Lab Results  Component Value Date   NA 140 08/12/2016   K 3.4 (L) 08/12/2016   CL 105 08/12/2016   CO2 28 08/12/2016   GLUCOSE 102 (H) 08/12/2016   BUN <5 (L) 08/12/2016   CREATININE 0.77 08/12/2016   CALCIUM 8.5 (L) 08/12/2016   PROT 6.3 (L) 08/12/2016   ALBUMIN 3.7 08/12/2016   AST 26 08/12/2016   ALT 16  08/12/2016   ALKPHOS 155 (H) 08/12/2016   BILITOT 0.6 08/12/2016   GFRNONAA >60 08/12/2016   GFRAA >60 08/12/2016    Lab Results  Component Value Date   WBC 25.2 (H) 08/12/2016   NEUTROABS 22.0 (H) 08/12/2016   HGB 11.5 (L) 08/12/2016   HCT 34.1 (L) 08/12/2016   MCV 87.5 08/12/2016   PLT 129 (L) 08/12/2016     STUDIES: No results found.  ASSESSMENT: Clinical stage IIIB ER/PR positive, HER-2 negative invasive carcinoma of the upper inner quadrant of the right breast.  PLAN:    1. Clinical stage IIIB ER/PR positive, HER-2 negative invasive carcinoma of the upper inner quadrant of the right breast: Patient's initial diagnosis was at least in September 2012. She received one dose of chemotherapy using Taxotere and Cytoxan in 2013. She did not receive any XRT or hormonal treatment. PET scan results from July 08, 2016 reviewed independently possibly suggesting metastatic lesions in her lung, but these are too small to characterize.  This will need to be repeated in the near future.  Patient received 1 dose of Taxotere and Cytoxan again on July 30, 2016, but then did not return for any further follow-up.  She has had not had any intervention or treatment since that time.  Patient admits she is hesitant to undergo surgery or chemotherapy, but did agree to initiate  letrozole today.  She expressed understanding that this may not be definitive treatment and likely not curative.  We will get a baseline bone mineral density in the next 1 to 2 weeks.  Return to clinic in 1 month for laboratory work and further evaluation.   2.  Breast wound: Patient was given a referral to wound care clinic for evaluation and treatment.  I spent a total of 30 minutes face-to-face with the patient of which greater than 50% of the visit was spent in counseling and coordination of care as detailed above.  Patient expressed understanding and was in agreement with this plan. She also understands that She can call clinic at any time with any questions, concerns, or complaints.   Cancer Staging Breast cancer of upper-inner quadrant of right female breast [facility]) Staging form: Breast, AJCC 8th Edition - Clinical: Stage IIIB (cT4b, cN1, cM0, G2, ER: Positive, PR: Positive, HER2: Negative) - Signed by Lloyd Huger, MD on 07/01/2016   Lloyd Huger, MD   02/10/2018 9:07 AM

## 2018-02-08 ENCOUNTER — Inpatient Hospital Stay: Payer: Medicare Other | Attending: Oncology | Admitting: Oncology

## 2018-02-08 ENCOUNTER — Other Ambulatory Visit: Payer: Self-pay

## 2018-02-08 VITALS — BP 111/61 | HR 53 | Temp 94.4°F | Resp 18 | Wt 89.7 lb

## 2018-02-08 DIAGNOSIS — Z170 Estrogen receptor positive status [ER+]: Secondary | ICD-10-CM

## 2018-02-08 DIAGNOSIS — C50211 Malignant neoplasm of upper-inner quadrant of right female breast: Secondary | ICD-10-CM | POA: Diagnosis present

## 2018-02-08 DIAGNOSIS — S21001A Unspecified open wound of right breast, initial encounter: Secondary | ICD-10-CM | POA: Diagnosis not present

## 2018-02-08 DIAGNOSIS — Z720 Tobacco use: Secondary | ICD-10-CM | POA: Diagnosis not present

## 2018-02-08 MED ORDER — LETROZOLE 2.5 MG PO TABS
2.5000 mg | ORAL_TABLET | Freq: Every day | ORAL | 3 refills | Status: DC
Start: 2018-02-08 — End: 2018-04-15

## 2018-02-08 NOTE — Progress Notes (Signed)
Pt here for follow up. Stated that Dr Candiss Norse told her she had a " fatty ulcer on left breast " pt stated it bleeds and area is currently bandaged. Referred to wound center. -appt to be made.

## 2018-03-02 ENCOUNTER — Ambulatory Visit: Payer: Medicare Other

## 2018-03-02 ENCOUNTER — Encounter: Payer: Medicare Other | Attending: Physician Assistant | Admitting: Physician Assistant

## 2018-03-02 DIAGNOSIS — Z9221 Personal history of antineoplastic chemotherapy: Secondary | ICD-10-CM | POA: Insufficient documentation

## 2018-03-02 DIAGNOSIS — I10 Essential (primary) hypertension: Secondary | ICD-10-CM | POA: Insufficient documentation

## 2018-03-02 DIAGNOSIS — Z170 Estrogen receptor positive status [ER+]: Secondary | ICD-10-CM | POA: Diagnosis not present

## 2018-03-02 DIAGNOSIS — Z885 Allergy status to narcotic agent status: Secondary | ICD-10-CM | POA: Diagnosis not present

## 2018-03-02 DIAGNOSIS — Z853 Personal history of malignant neoplasm of breast: Secondary | ICD-10-CM | POA: Insufficient documentation

## 2018-03-02 DIAGNOSIS — E7800 Pure hypercholesterolemia, unspecified: Secondary | ICD-10-CM | POA: Diagnosis not present

## 2018-03-02 DIAGNOSIS — E11622 Type 2 diabetes mellitus with other skin ulcer: Secondary | ICD-10-CM | POA: Diagnosis present

## 2018-03-02 DIAGNOSIS — E1151 Type 2 diabetes mellitus with diabetic peripheral angiopathy without gangrene: Secondary | ICD-10-CM | POA: Insufficient documentation

## 2018-03-02 DIAGNOSIS — I252 Old myocardial infarction: Secondary | ICD-10-CM | POA: Diagnosis not present

## 2018-03-02 DIAGNOSIS — F17200 Nicotine dependence, unspecified, uncomplicated: Secondary | ICD-10-CM | POA: Insufficient documentation

## 2018-03-02 DIAGNOSIS — I2510 Atherosclerotic heart disease of native coronary artery without angina pectoris: Secondary | ICD-10-CM | POA: Insufficient documentation

## 2018-03-02 DIAGNOSIS — F329 Major depressive disorder, single episode, unspecified: Secondary | ICD-10-CM | POA: Diagnosis not present

## 2018-03-02 DIAGNOSIS — Z8249 Family history of ischemic heart disease and other diseases of the circulatory system: Secondary | ICD-10-CM | POA: Diagnosis not present

## 2018-03-02 DIAGNOSIS — M1990 Unspecified osteoarthritis, unspecified site: Secondary | ICD-10-CM | POA: Insufficient documentation

## 2018-03-02 DIAGNOSIS — L98492 Non-pressure chronic ulcer of skin of other sites with fat layer exposed: Secondary | ICD-10-CM | POA: Diagnosis not present

## 2018-03-05 NOTE — Progress Notes (Signed)
Christine [name], Christine [name] ([id number]) Visit Report for 03/02/2018 Allergy List Details Patient Name: Christine [name], Christine [name]. Date of Service: 03/02/2018 1:00 PM Medical Record Number: [id number] Patient Account Number: [id number] Date of Birth/Sex: [birth year] (72 y.o. Female) Treating RN: Cornell Barman Primary Care Kora Groom: Glendon Axe Other Clinician: Referring Bria Portales: Lavonia Drafts Treating Raywood Wailes/Extender: Melburn Hake, HOYT Weeks in Treatment: 0 Allergies Active Allergies oxycodone Reaction: restless Allergy Notes Electronic Signature(s) Signed: 03/02/2018 6:14:11 PM By: Gretta Cool, BSN, RN, CWS, Kim RN, BSN Entered By: Gretta Cool, BSN, RN, CWS, Kim on 03/02/2018 13:32:29 Christine [name] ([id number]) -------------------------------------------------------------------------------- [place] Information Details Patient Name: Christine [name], Christine [name]. Date of Service: 03/02/2018 1:00 PM Medical Record Number: [id number] Patient Account Number: [id number] Date of Birth/Sex: March 26, 1946 (72 y.o. Female) Treating RN: Montey Hora Primary Care Lisle Skillman: Glendon Axe Other Clinician: Referring Clarke Amburn: Lavonia Drafts Treating Agness Sibrian/Extender: Melburn Hake, HOYT Weeks in Treatment: 0 Visit Information Patient Arrived: Ambulatory Arrival Time: 13:21 Accompanied By: self Transfer Assistance: None Patient Identification Verified: Yes Secondary Verification Process Completed: Yes Electronic Signature(s) Signed: 03/02/2018 2:31:41 PM By: Lorine Bears RCP, RRT, CHT Entered By: Lorine Bears on 03/02/2018 13:22:00 Christine [name] ([id number]) -------------------------------------------------------------------------------- Clinic Level of Care Assessment Details Patient Name: Christine [name], Christine [name]. Date of Service: 03/02/2018 1:00 PM Medical Record Number: [id number] Patient Account Number: [id number] Date of Birth/Sex: 1946-02-12 (72 y.o. Female) Treating RN: Montey Hora Primary Care Mckenzie Toruno:  Glendon Axe Other Clinician: Referring Merrell Borsuk: Lavonia Drafts Treating Renard Caperton/Extender: STONE III, HOYT Weeks in Treatment: 0 Clinic Level of Care Assessment Items TOOL 2 Quantity Score '[]'  - Use when only an EandM is performed on the INITIAL visit 0 ASSESSMENTS - Nursing Assessment / Reassessment X - General Physical Exam (combine w/ comprehensive assessment (listed just below) when 1 20 performed on new pt. evals) X- 1 25 Comprehensive Assessment (HX, ROS, Risk Assessments, Wounds Hx, etc.) ASSESSMENTS - Wound and Skin Assessment / Reassessment X - Simple Wound Assessment / Reassessment - one wound 1 5 '[]'  - 0 Complex Wound Assessment / Reassessment - multiple wounds '[]'  - 0 Dermatologic / Skin Assessment (not related to wound area) ASSESSMENTS - Ostomy and/or Continence Assessment and Care '[]'  - Incontinence Assessment and Management 0 '[]'  - 0 Ostomy Care Assessment and Management (repouching, etc.) PROCESS - Coordination of Care X - Simple Patient / Family Education for ongoing care 1 15 '[]'  - 0 Complex (extensive) Patient / Family Education for ongoing care '[]'  - 0 Staff obtains Programmer, systems, Records, Test Results / Process Orders '[]'  - 0 Staff telephones HHA, Nursing Homes / Clarify orders / etc '[]'  - 0 Routine Transfer to another Facility (non-emergent condition) '[]'  - 0 Routine Hospital Admission (non-emergent condition) X- 1 15 New Admissions / Biomedical engineer / Ordering NPWT, Apligraf, etc. '[]'  - 0 Emergency Hospital Admission (emergent condition) X- 1 10 Simple Discharge Coordination '[]'  - 0 Complex (extensive) Discharge Coordination PROCESS - Special Needs '[]'  - Pediatric / Minor Patient Management 0 '[]'  - 0 Isolation Patient Management Christine [name] ([id number]) '[]'  - 0 Hearing / Language / Visual special needs '[]'  - 0 Assessment of Community assistance (transportation, D/C planning, etc.) '[]'  - 0 Additional assistance / Altered mentation '[]'  - 0 Support  Surface(s) Assessment (bed, cushion, seat, etc.) INTERVENTIONS - Wound Cleansing / Measurement X - Wound Imaging (photographs - any number of wounds) 1 5 '[]'  - 0 Wound Tracing (instead of photographs) X- 1 5 Simple Wound Measurement - one wound '[]'  - 0 Complex Wound Measurement - multiple wounds X-  1 5 Simple Wound Cleansing - one wound '[]'  - 0 Complex Wound Cleansing - multiple wounds INTERVENTIONS - Wound Dressings X - Small Wound Dressing one or multiple wounds 1 10 '[]'  - 0 Medium Wound Dressing one or multiple wounds '[]'  - 0 Large Wound Dressing one or multiple wounds '[]'  - 0 Application of Medications - injection INTERVENTIONS - Miscellaneous '[]'  - External ear exam 0 '[]'  - 0 Specimen Collection (cultures, biopsies, blood, body fluids, etc.) '[]'  - 0 Specimen(s) / Culture(s) sent or taken to Lab for analysis '[]'  - 0 Patient Transfer (multiple staff / Civil Service fast streamer / Similar devices) '[]'  - 0 Simple Staple / Suture removal (25 or less) '[]'  - 0 Complex Staple / Suture removal (26 or more) '[]'  - 0 Hypo / Hyperglycemic Management (close monitor of Blood Glucose) '[]'  - 0 Ankle / Brachial Index (ABI) - do not check if billed separately Has the patient been seen at the hospital within the last three years: Yes Total Score: 115 Level Of Care: New/Established - Level 3 Electronic Signature(s) Signed: 03/02/2018 5:04:25 PM By: Montey Hora Entered By: Montey Hora on 03/02/2018 14:46:44 Christine [name] ([id number]) -------------------------------------------------------------------------------- Lower Extremity Assessment Details Patient Name: Christine [name]. Date of Service: 03/02/2018 1:00 PM Medical Record Number: [id number] Patient Account Number: [id number] Date of Birth/Sex: 28-Oct-1945 (72 y.o. Female) Treating RN: Cornell Barman Primary Care Kathleena Freeman: Glendon Axe Other Clinician: Referring Loisann Roach: Lavonia Drafts Treating Lucila Klecka/Extender: Melburn Hake, HOYT Weeks in Treatment:  0 Electronic Signature(s) Signed: 03/02/2018 6:14:11 PM By: Gretta Cool, BSN, RN, CWS, Kim RN, BSN Entered By: Gretta Cool, BSN, RN, CWS, Kim on 03/02/2018 13:31:45 Christine [name] ([id number]) -------------------------------------------------------------------------------- Multi Wound Chart Details Patient Name: Christine [name], Christine [name]. Date of Service: 03/02/2018 1:00 PM Medical Record Number: [id number] Patient Account Number: [id number] Date of Birth/Sex: July 30, 1945 (72 y.o. Female) Treating RN: Montey Hora Primary Care Yuji Walth: Glendon Axe Other Clinician: Referring Kaisey Huseby: Lavonia Drafts Treating Jacon Whetzel/Extender: STONE III, HOYT Weeks in Treatment: 0 Vital Signs Height(in): 62 Pulse(bpm): 60 Weight(lbs): 89.5 Blood Pressure(mmHg): 129/76 Body Mass Index(BMI): 16 Temperature(F): 98.4 Respiratory Rate 16 (breaths/min): Photos: [1:No Photos] [N/A:N/A] Wound Location: [1:Left Breast] [N/A:N/A] Wounding Event: [1:Gradually Appeared] [N/A:N/A] Primary Etiology: [1:Malignant Wound] [N/A:N/A] Comorbid History: [1:Cataracts, Congestive Heart Failure, Coronary Artery Disease, Hypertension, Myocardial Infarction, Peripheral Arterial Disease, Osteoarthritis, Received Chemotherapy] [N/A:N/A] Date Acquired: [1:06/16/2016] [N/A:N/A] Weeks of Treatment: [1:0] [N/A:N/A] Wound Status: [1:Open] [N/A:N/A] Measurements L x W x D [1:5.2x4x0.1] [N/A:N/A] (cm) Area (cm) : [1:16.336] [N/A:N/A] Volume (cm) : [1:1.634] [N/A:N/A] % Reduction in Area: [1:0.00%] [N/A:N/A] % Reduction in Volume: [1:0.00%] [N/A:N/A] Classification: [1:Full Thickness Without Exposed Support Structures] [N/A:N/A] Exudate Amount: [1:Medium] [N/A:N/A] Exudate Type: [1:Serosanguineous] [N/A:N/A] Exudate Color: [1:red, brown] [N/A:N/A] Wound Margin: [1:Flat and Intact] [N/A:N/A] Granulation Amount: [1:None Present (0%)] [N/A:N/A] Necrotic Amount: [1:Large (67-100%)] [N/A:N/A] Necrotic Tissue: [1:Eschar, Adherent Slough]  [N/A:N/A] Exposed Structures: [1:Fat Layer (Subcutaneous Tissue) Exposed: Yes Fascia: No Tendon: No Muscle: No Joint: No Bone: No] [N/A:N/A] Epithelialization: [1:None] [N/A:N/A] Periwound Skin Texture: Excoriation: No N/A N/A Induration: No Callus: No Crepitus: No Rash: No Scarring: No Periwound Skin Moisture: Maceration: No N/A N/A Dry/Scaly: No Periwound Skin Color: Atrophie Blanche: No N/A N/A Cyanosis: No Ecchymosis: No Erythema: No Hemosiderin Staining: No Mottled: No Pallor: No Rubor: No Tenderness on Palpation: Yes N/A N/A Treatment Notes Electronic Signature(s) Signed: 03/02/2018 5:04:25 PM By: Montey Hora Entered By: Montey Hora on 03/02/2018 14:10:31 Christine [name] ([id number]) -------------------------------------------------------------------------------- [place] Details Patient Name: Christine [name], Christine [name]. Date of Service: 03/02/2018 1:00  PM Medical Record Number: [id number] Patient Account Number: [id number] Date of Birth/Sex: 1945/07/14 (72 y.o. Female) Treating RN: Montey Hora Primary Care Briann Sarchet: Glendon Axe Other Clinician: Referring Bowen Kia: Lavonia Drafts Treating Milah Recht/Extender: Melburn Hake, HOYT Weeks in Treatment: 0 Active Inactive ` Abuse / Safety / Falls / Self Care Management Nursing Diagnoses: Potential for falls Goals: Patient will remain injury free related to falls Date Initiated: 03/02/2018 Target Resolution Date: 05/14/2018 Goal Status: Active Interventions: Assess fall risk on admission and as needed Notes: ` Orientation to the Wound Care Program Nursing Diagnoses: Knowledge deficit related to the wound healing center program Goals: Patient/caregiver will verbalize understanding of the [facility] Program Date Initiated: 03/02/2018 Target Resolution Date: 05/14/2018 Goal Status: Active Interventions: Provide education on orientation to the wound center Notes: ` Pain, Acute or  Chronic Nursing Diagnoses: Pain, acute or chronic: actual or potential Goals: Patient/caregiver will verbalize comfort level met Date Initiated: 03/02/2018 Target Resolution Date: 05/14/2018 Goal Status: Active Interventions: ALDENA, WORM ([id number]) Assess comfort goal upon admission Notes: ` Wound/Skin Impairment Nursing Diagnoses: Impaired tissue integrity Goals: Ulcer/skin breakdown will heal within 14 weeks Date Initiated: 03/02/2018 Target Resolution Date: 05/14/2018 Goal Status: Active Interventions: Assess patient/caregiver ability to obtain necessary supplies Assess patient/caregiver ability to perform ulcer/skin care regimen upon admission and as needed Assess ulceration(s) every visit Notes: Electronic Signature(s) Signed: 03/02/2018 5:04:25 PM By: Montey Hora Entered By: Montey Hora on 03/02/2018 14:10:15 Christine [name] ([id number]) -------------------------------------------------------------------------------- Pain Assessment Details Patient Name: Christine [name]. Date of Service: 03/02/2018 1:00 PM Medical Record Number: [id number] Patient Account Number: [id number] Date of Birth/Sex: May 21, 1946 (72 y.o. Female) Treating RN: Montey Hora Primary Care Chanise Habeck: Glendon Axe Other Clinician: Referring Abigaile Rossie: Lavonia Drafts Treating Szymon Foiles/Extender: STONE III, HOYT Weeks in Treatment: 0 Active Problems Location of Pain Severity and Description of Pain Patient Has Paino Yes Site Locations Pain Management and Medication Current Pain Management: Electronic Signature(s) Signed: 03/02/2018 2:31:41 PM By: Lorine Bears RCP, RRT, CHT Signed: 03/02/2018 5:04:25 PM By: Montey Hora Entered By: Lorine Bears on 03/02/2018 13:22:23 Christine [name] ([id number]) -------------------------------------------------------------------------------- Wound Assessment Details Patient Name: Christine [name], Christine [name]. Date of Service: 03/02/2018  1:00 PM Medical Record Number: [id number] Patient Account Number: [id number] Date of Birth/Sex: 22-Apr-1946 (72 y.o. Female) Treating RN: Cornell Barman Primary Care Avenly Roberge: Glendon Axe Other Clinician: Referring Angelia Hazell: Lavonia Drafts Treating Larri Yehle/Extender: STONE III, HOYT Weeks in Treatment: 0 Wound Status Wound Number: 1 Primary Malignant Wound Etiology: Wound Location: Left Breast Wound Open Wounding Event: Gradually Appeared Status: Date Acquired: 06/16/2016 Comorbid Cataracts, Congestive Heart Failure, Coronary Weeks Of Treatment: 0 History: Artery Disease, Hypertension, Myocardial Clustered Wound: No Infarction, Peripheral Arterial Disease, Osteoarthritis, Received Chemotherapy Photos Photo Uploaded By: Gretta Cool, BSN, RN, CWS, Kim on 03/02/2018 18:11:01 Wound Measurements Length: (cm) 5.2 Width: (cm) 4 Depth: (cm) 0.1 Area: (cm) 16.336 Volume: (cm) 1.634 % Reduction in Area: 0% % Reduction in Volume: 0% Epithelialization: None Tunneling: No Undermining: No Wound Description Full Thickness Without Exposed Support Foul Odo Classification: Structures Slough/F Wound Margin: Flat and Intact Exudate Medium Amount: Exudate Type: Serosanguineous Exudate Color: red, brown r After Cleansing: No ibrino Yes Wound Bed Granulation Amount: None Present (0%) Exposed Structure Necrotic Amount: Large (67-100%) Fascia Exposed: No Necrotic Quality: Eschar, Adherent Slough Fat Layer (Subcutaneous Tissue) Exposed: Yes Tendon Exposed: No Muscle Exposed: No Joint Exposed: No Christine [name], Christine [name] F. ([id number]) Bone Exposed: No Periwound Skin Texture Texture Color No Abnormalities Noted: No No Abnormalities Noted: No Callus: No Atrophie Blanche:  No Crepitus: No Cyanosis: No Excoriation: No Ecchymosis: No Induration: No Erythema: No Rash: No Hemosiderin Staining: No Scarring: No Mottled: No Pallor: No Moisture Rubor: No No Abnormalities Noted: No Dry / Scaly:  No Temperature / Pain Maceration: No Tenderness on Palpation: Yes Electronic Signature(s) Signed: 03/02/2018 6:14:11 PM By: Gretta Cool, BSN, RN, CWS, Kim RN, BSN Entered By: Gretta Cool, BSN, RN, CWS, Kim on 03/02/2018 13:50:36 Christine [name] ([id number]) -------------------------------------------------------------------------------- Vitals Details Patient Name: Christine [name]. Date of Service: 03/02/2018 1:00 PM Medical Record Number: [id number] Patient Account Number: [id number] Date of Birth/Sex: 1945-08-12 (72 y.o. Female) Treating RN: Montey Hora Primary Care Raenah Murley: Glendon Axe Other Clinician: Referring Derran Sear: Lavonia Drafts Treating Zoraida Havrilla/Extender: STONE III, HOYT Weeks in Treatment: 0 Vital Signs Time Taken: 13:20 Temperature (F): 98.4 Height (in): 62 Pulse (bpm): 60 Weight (lbs): 89.5 Respiratory Rate (breaths/min): 16 Body Mass Index (BMI): 16.4 Blood Pressure (mmHg): 129/76 Reference Range: 80 - 120 mg / dl Electronic Signature(s) Signed: 03/02/2018 2:31:41 PM By: Lorine Bears RCP, RRT, CHT Entered By: Becky Sax, Amado Nash on 03/02/2018 13:24:38

## 2018-03-05 NOTE — Progress Notes (Signed)
Christine [name], Christine [name] ([id number]) Visit Report for 03/02/2018 Abuse/Suicide Risk Screen Details Patient Name: Christine [name], Christine [name]. Date of Service: 03/02/2018 1:00 PM Medical Record Number: [id number] Patient Account Number: [id number] Date of Birth/Sex: 1945-06-30 (72 y.o. Female) Treating RN: Cornell Barman Primary Care Anya Murphey: Glendon Axe Other Clinician: Referring Hajar Penninger: Lavonia Drafts Treating Mylo Driskill/Extender: STONE III, HOYT Weeks in Treatment: 0 Abuse/Suicide Risk Screen Items Answer ABUSE/SUICIDE RISK SCREEN: Has anyone close to you tried to hurt or harm you recentlyo No Do you feel uncomfortable with anyone in your familyo No Has anyone forced you do things that you didnot want to doo No Do you have any thoughts of harming yourselfo No Patient displays signs or symptoms of abuse and/or neglect. No Electronic Signature(s) Signed: 03/02/2018 6:14:11 PM By: Gretta Cool, BSN, RN, CWS, Kim RN, BSN Entered By: Gretta Cool, BSN, RN, CWS, Kim on 03/02/2018 13:47:51 Christine [name] ([id number]) -------------------------------------------------------------------------------- Activities of Daily Living Details Patient Name: Christine [name], Christine [name]. Date of Service: 03/02/2018 1:00 PM Medical Record Number: [id number] Patient Account Number: [id number] Date of Birth/Sex: 06-20-1945 (72 y.o. Female) Treating RN: Cornell Barman Primary Care Ahrianna Siglin: Glendon Axe Other Clinician: Referring Destin Vinsant: Lavonia Drafts Treating Reza Crymes/Extender: Melburn Hake, HOYT Weeks in Treatment: 0 Activities of Daily Living Items Answer Activities of Daily Living (Please select one for each item) Drive Automobile Completely Able Take Medications Completely Able Use Telephone Completely Able Care for Appearance Completely Able Use Toilet Completely Able Bath / Shower Completely Able Dress Self Completely Able Feed Self Completely Able Walk Completely Able Get In / Out Bed Completely Able Housework Completely Able Prepare Meals  Completely Able Handle Money Completely Able Shop for Self Completely Able Electronic Signature(s) Signed: 03/02/2018 6:14:11 PM By: Gretta Cool, BSN, RN, CWS, Kim RN, BSN Entered By: Gretta Cool, BSN, RN, CWS, Kim on 03/02/2018 13:48:01 Christine [name] ([id number]) -------------------------------------------------------------------------------- Education Assessment Details Patient Name: Christine [name], Christine [name]. Date of Service: 03/02/2018 1:00 PM Medical Record Number: [id number] Patient Account Number: [id number] Date of Birth/Sex: [birth year] (72 y.o. Female) Treating RN: Cornell Barman Primary Care Jarrick Fjeld: Glendon Axe Other Clinician: Referring Kashden Deboy: Lavonia Drafts Treating Afrika Brick/Extender: Melburn Hake, HOYT Weeks in Treatment: 0 Primary Learner Assessed: Patient Learning Preferences/Education Level/Primary Language Learning Preference: Explanation, Demonstration Highest Education Level: High School Preferred Language: English Cognitive Barrier Assessment/Beliefs Language Barrier: No Translator Needed: No Memory Deficit: No Emotional Barrier: No Cultural/Religious Beliefs Affecting Medical Care: No Physical Barrier Assessment Impaired Vision: No Impaired Hearing: No Decreased Hand dexterity: No Knowledge/Comprehension Assessment Knowledge Level: Medium Comprehension Level: Medium Ability to understand written Medium instructions: Ability to understand verbal Medium instructions: Motivation Assessment Anxiety Level: Calm Cooperation: Cooperative Education Importance: Acknowledges Need Interest in Health Problems: Asks Questions Perception: Confused Willingness to Engage in Self- High Management Activities: Readiness to Engage in Self- High Management Activities: Electronic Signature(s) Signed: 03/02/2018 6:14:11 PM By: Gretta Cool, BSN, RN, CWS, Kim RN, BSN Entered By: Gretta Cool, BSN, RN, CWS, Kim on 03/02/2018 13:48:40 Christine [name]  ([id number]) -------------------------------------------------------------------------------- Fall Risk Assessment Details Patient Name: Christine [name]. Date of Service: 03/02/2018 1:00 PM Medical Record Number: [id number] Patient Account Number: [id number] Date of Birth/Sex: 11-02-1945 (72 y.o. Female) Treating RN: Cornell Barman Primary Care Sakiya Stepka: Glendon Axe Other Clinician: Referring Quinesha Selinger: Lavonia Drafts Treating Matty Deamer/Extender: Melburn Hake, HOYT Weeks in Treatment: 0 Fall Risk Assessment Items Have you had 2 or more falls in the last 12 monthso 0 No Have you had any fall that resulted in injury in the last 12 monthso 0 No FALL RISK ASSESSMENT: History of  falling - immediate or within 3 months 0 No Secondary diagnosis 0 No Ambulatory aid None/bed rest/wheelchair/nurse 0 No Crutches/cane/walker 0 No Furniture 0 No IV Access/Saline Lock 0 No Gait/Training Normal/bed rest/immobile 0 No Weak 0 No Impaired 0 No Mental Status Oriented to own ability 0 No Electronic Signature(s) Signed: 03/02/2018 6:14:11 PM By: Gretta Cool, BSN, RN, CWS, Kim RN, BSN Entered By: Gretta Cool, BSN, RN, CWS, Kim on 03/02/2018 13:48:46 Christine [name] ([id number]) -------------------------------------------------------------------------------- Foot Assessment Details Patient Name: Christine [name], Christine [name]. Date of Service: 03/02/2018 1:00 PM Medical Record Number: [id number] Patient Account Number: [id number] Date of Birth/Sex: 1945/11/02 (72 y.o. Female) Treating RN: Cornell Barman Primary Care Edras Wilford: Glendon Axe Other Clinician: Referring Liller Yohn: Lavonia Drafts Treating Ansar Skoda/Extender: STONE III, HOYT Weeks in Treatment: 0 Foot Assessment Items Site Locations + = Sensation present, - = Sensation absent, C = Callus, U = Ulcer R = Redness, W = Warmth, M = Maceration, PU = Pre-ulcerative lesion F = Fissure, S = Swelling, D = Dryness Assessment Right: Left: Other Deformity: No No Prior Foot Ulcer: No  No Prior Amputation: No No Charcot Joint: No No Ambulatory Status: Ambulatory Without Help Gait: Steady Electronic Signature(s) Signed: 03/02/2018 6:14:11 PM By: Gretta Cool, BSN, RN, CWS, Kim RN, BSN Entered By: Gretta Cool, BSN, RN, CWS, Kim on 03/02/2018 13:49:05 Christine [name] ([id number]) -------------------------------------------------------------------------------- Nutrition Risk Assessment Details Patient Name: Christine [name], Christine [name]. Date of Service: 03/02/2018 1:00 PM Medical Record Number: [id number] Patient Account Number: [id number] Date of Birth/Sex: 27-Mar-1946 (72 y.o. Female) Treating RN: Cornell Barman Primary Care Emerald Shor: Glendon Axe Other Clinician: Referring Lakeyn Dokken: Lavonia Drafts Treating Nishant Schrecengost/Extender: Melburn Hake, HOYT Weeks in Treatment: 0 Height (in): 62 Weight (lbs): 89.5 Body Mass Index (BMI): 16.4 Nutrition Risk Assessment Items NUTRITION RISK SCREEN: I have an illness or condition that made me change the kind and/or amount of 0 No food I eat I eat fewer than two meals per day 0 No I eat few fruits and vegetables, or milk products 0 No I have three or more drinks of beer, liquor or wine almost every day 0 No I have tooth or mouth problems that make it hard for me to eat 0 No I don't always have enough money to buy the food I need 0 No I eat alone most of the time 0 No I take three or more different prescribed or over-the-counter drugs a day 1 Yes Without wanting to, I have lost or gained 10 pounds in the last six months 0 No I am not always physically able to shop, cook and/or feed myself 0 No Nutrition Protocols Good Risk Protocol 0 No interventions needed Moderate Risk Protocol Electronic Signature(s) Signed: 03/02/2018 6:14:11 PM By: Gretta Cool, BSN, RN, CWS, Kim RN, BSN Entered By: Gretta Cool, BSN, RN, CWS, Kim on 03/02/2018 13:48:55

## 2018-03-05 NOTE — Progress Notes (Signed)
Christine, [name] ([id number]) Visit Report for 03/02/2018 Chief Complaint Document Details Patient Name: Christine [name], Christine [name]. Date of Service: 03/02/2018 1:00 PM Medical Record Number: [id number] Patient Account Number: [id number] Date of Birth/Sex: 11-14-1945 (72 y.o. Female) Treating RN: Montey Hora Primary Care Provider: Glendon Axe Other Clinician: Referring Provider: Lavonia Drafts Treating Provider/Extender: Melburn Hake, HOYT Weeks in Treatment: 0 Information Obtained from: Patient Chief Complaint Left breast ulcer Electronic Signature(s) Signed: 03/02/2018 4:58:18 PM By: Worthy Keeler PA-C Entered By: Worthy Keeler on 03/02/2018 14:07:38 Christine [name] ([id number]) -------------------------------------------------------------------------------- HPI Details Patient Name: Christine [name]. Date of Service: 03/02/2018 1:00 PM Medical Record Number: [id number] Patient Account Number: [id number] Date of Birth/Sex: 07-Jan-1946 (72 y.o. Female) Treating RN: Montey Hora Primary Care Provider: Glendon Axe Other Clinician: Referring Provider: Lavonia Drafts Treating Provider/Extender: STONE III, HOYT Weeks in Treatment: 0 History of Present Illness Associated Signs and Symptoms: Patient has a history of hypertension, breast cancer, she is a current tobacco user, and she does have depression. HPI Description: 03/02/18 on evaluation today patient is seen for initial evaluation here in our clinic concerning issues that she has been having with an ulcer over the left breast area involving the nipple which is a result of Carcinoma. With that being said the patient appears to have received intermittent treatment due to discontinuing follow-up at times. This was initially back in 2013 and then subsequently on July 08, 2016 she had a pet scan suggesting the possibility of metastatic lesions in her long. They were too small to characterize however. She received one dose of chemotherapy  July 30, 2016 again and then subsequently did not do follow-up for any additional follow-up in the interim. She is has a 10 according to records to undergo surgery or chemotherapy. With that being said she has had the wound on her breast foot seems to be quite a while possibly even as much as a year and a half having open January 2018. 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 She was referred to [place] by Dr. Grayland Ormond regarding the ulcer itself. Her current treatment plan was discussed to not necessarily be curative although it was the only thing she really wanted to pursue at this point. Nonetheless she does state that she understands all of this mentioned above. Nonetheless she's currently been using triple antibiotic ointment along with a Band-Aid over the wound area. She does have discomfort in fact she really didn't want anybody touching the area today due to health painful that was. Fortunately there's no evidence of overt infection at this point. No fevers chills noted Electronic Signature(s) Signed: 03/02/2018 4:58:18 PM By: Worthy Keeler PA-C Entered By: Worthy Keeler on 03/02/2018 16:55:29 Christine [name] ([id number]) -------------------------------------------------------------------------------- Physical Exam Details Patient Name: Christine [name], Christine [name]. Date of Service: 03/02/2018 1:00 PM Medical Record Number: [id number] Patient Account Number: [id number] Date of Birth/Sex: [birth year] (72 y.o. Female) Treating RN: Montey Hora Primary Care Provider: Glendon Axe Other Clinician: Referring Provider: Lavonia Drafts Treating Provider/Extender: STONE III, HOYT Weeks in Treatment: 0 Constitutional sitting or standing blood pressure is within target range for patient.. pulse regular and within target range for patient.Marland Kitchen respirations regular, non-labored and within target range for patient.Marland Kitchen temperature within  target range for patient.. Well- nourished and well-hydrated in no acute distress. Eyes conjunctiva clear no eyelid edema noted. pupils equal round and reactive to light and accommodation. Ears, Nose, Mouth, and Throat no gross  abnormality of ear auricles or external auditory canals. normal hearing noted during conversation. mucus membranes moist. Respiratory normal breathing without difficulty. clear to auscultation bilaterally. Cardiovascular regular rate and rhythm with normal S1, S2. no clubbing, cyanosis, significant edema, <3 sec cap refill. Gastrointestinal (GI) soft, non-tender, non-distended, +BS. no ventral hernia noted. Musculoskeletal normal gait and posture. no significant deformity or arthritic changes, no loss or range of motion, no clubbing. Psychiatric this patient is able to make decisions and demonstrates good insight into disease process. Alert and Oriented x 3. pleasant and cooperative. Notes On inspection today patient actually appears to be doing rather well over at this point in time which is good news. She also does not seem to have any evidence of purulent discharge which is also good news. With that being said I explained to the patient that I was he would not perform any debridement being that this is a cancerous ulcer. We also would not recommend aggressive therapy again due to the nature of the ulcer as well. Nonetheless I do believe that we can conservatively attend to help manage this at this point she understands completely. Fortunately there's no evidence of infection at this time. Electronic Signature(s) Signed: 03/02/2018 4:58:18 PM By: Worthy Keeler PA-C Entered By: Worthy Keeler on 03/02/2018 16:56:29 Christine [name] ([id number]) -------------------------------------------------------------------------------- Physician Orders Details Patient Name: Christine [name], Christine [name]. Date of Service: 03/02/2018 1:00 PM Medical Record Number: [id number] Patient  Account Number: [id number] Date of Birth/Sex: 1945/08/09 (72 y.o. Female) Treating RN: Montey Hora Primary Care Provider: Glendon Axe Other Clinician: Referring Provider: Lavonia Drafts Treating Provider/Extender: Melburn Hake, HOYT Weeks in Treatment: 0 Verbal / Phone Orders: No Diagnosis Coding ICD-10 Coding Code Description C50.912 Malignant neoplasm of unspecified site of left female breast L98.492 Non-pressure chronic ulcer of skin of other sites with fat layer exposed Z72.0 Tobacco use I10 Essential (primary) hypertension Wound Cleansing Wound #1 Left Breast o May Shower, gently pat wound dry prior to applying new dressing. Primary Wound Dressing Wound #1 Left Breast o Silver Alginate Secondary Dressing Wound #1 Left Breast o [place] Draft - or bandaid Dressing Change Frequency Wound #1 Left Breast o Change dressing every day. Follow-up Appointments Wound #1 Left Breast o Return Appointment in 2 weeks. Electronic Signature(s) Signed: 03/02/2018 4:58:18 PM By: Worthy Keeler PA-C Signed: 03/02/2018 5:04:25 PM By: Montey Hora Entered By: Montey Hora on 03/02/2018 14:18:22 Christine [name] ([id number]) -------------------------------------------------------------------------------- Problem List Details Patient Name: MARCELLINA, JONSSON. Date of Service: 03/02/2018 1:00 PM Medical Record Number: [id number] Patient Account Number: [id number] Date of Birth/Sex: 1946-02-06 (72 y.o. Female) Treating RN: Montey Hora Primary Care Provider: Glendon Axe Other Clinician: Referring Provider: Lavonia Drafts Treating Provider/Extender: Melburn Hake, HOYT Weeks in Treatment: 0 Active Problems ICD-10 Evaluated Encounter Code Description Active Date Today Diagnosis C50.912 Malignant neoplasm of unspecified site of left female breast 03/02/2018 No Yes L98.492 Non-pressure chronic ulcer of skin of other sites with fat layer 03/02/2018 No Yes exposed Z72.0 Tobacco use  03/02/2018 No Yes I10 Essential (primary) hypertension 03/02/2018 No Yes Inactive Problems Resolved Problems Electronic Signature(s) Signed: 03/02/2018 4:58:18 PM By: Worthy Keeler PA-C Entered By: Worthy Keeler on 03/02/2018 14:07:17 Christine [name] ([id number]) -------------------------------------------------------------------------------- Progress Note Details Patient Name: Christine [name]. Date of Service: 03/02/2018 1:00 PM Medical Record Number: [id number] Patient Account Number: [id number] Date of Birth/Sex: 1945-09-13 (72 y.o. Female) Treating RN: Montey Hora Primary Care Provider: Glendon Axe Other Clinician: Referring Provider: Lavonia Drafts Treating Provider/Extender: Melburn Hake,  HOYT Weeks in Treatment: 0 Subjective Chief Complaint Information obtained from Patient Left breast ulcer History of Present Illness (HPI) The following HPI elements were documented for the patient's wound: Associated Signs and Symptoms: Patient has a history of hypertension, breast cancer, she is a current tobacco user, and she does have depression. 03/02/18 on evaluation today patient is seen for initial evaluation here in our clinic concerning issues that she has been having with an ulcer over the left breast area involving the nipple which is a result of Carcinoma. With that being said the patient appears to have received intermittent treatment due to discontinuing follow-up at times. This was initially back in 2013 and then subsequently on July 08, 2016 she had a pet scan suggesting the possibility of metastatic lesions in her long. They were too small to characterize however. She received one dose of chemotherapy July 30, 2016 again and then subsequently did not do follow-up for any additional follow-up in the interim. She is has a 10 according to records to undergo surgery or chemotherapy. With that being said she has had the wound on her breast foot seems to be quite a while  possibly even as much as a year and a half having open January 2018. 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 She was referred to [place] by Dr. Grayland Ormond regarding the ulcer itself. Her current treatment plan was discussed to not necessarily be curative although it was the only thing she really wanted to pursue at this point. Nonetheless she does state that she understands all of this mentioned above. Nonetheless she's currently been using triple antibiotic ointment along with a Band-Aid over the wound area. She does have discomfort in fact she really didn't want anybody touching the area today due to health painful that was. Fortunately there's no evidence of overt infection at this point. No fevers chills noted Wound History Patient presents with 1 open wound that has been present for approximately 1.8 months. Patient has been treating wound in the following manner: neosporin. Laboratory tests have not been performed in the last month. Patient reportedly has not tested positive for an antibiotic resistant organism. Patient History Information obtained from Patient. Allergies oxycodone (Reaction: restless) Family History Heart Disease - Father,Paternal Grandparents, Hypertension - Father,Child, Stroke - Mother, No family history of Cancer, Diabetes, Kidney Disease, Lung Disease, Seizures, Thyroid Problems, Tuberculosis. Social History Current every day smoker, Marital Status - Divorced, Alcohol Use - Never, Drug Use - No History, Caffeine Use - Daily. Medical History Eyes Christine [name], Christine [name] ([id number]) Patient has history of Cataracts Denies history of Glaucoma, Optic Neuritis Ear/Nose/Mouth/Throat Denies history of Chronic sinus problems/congestion, Middle ear problems Hematologic/Lymphatic Denies history of Anemia, Hemophilia, Human Immunodeficiency Virus, Lymphedema, Sickle Cell Disease Respiratory Denies history  of Aspiration, Asthma, Chronic Obstructive Pulmonary Disease (COPD), Pneumothorax, Sleep Apnea, Tuberculosis Cardiovascular Patient has history of Congestive Heart Failure, Coronary Artery Disease, Hypertension, Myocardial Infarction, Peripheral Arterial Disease Denies history of Angina, Arrhythmia, Deep Vein Thrombosis, Hypotension, Peripheral Venous Disease, Phlebitis, Vasculitis Gastrointestinal Denies history of Cirrhosis , Colitis, Crohn s, Hepatitis A, Hepatitis B Endocrine Denies history of Type I Diabetes, Type II Diabetes Genitourinary Denies history of End Stage Renal Disease Immunological Denies history of Lupus Erythematosus, Raynaud s, Scleroderma Integumentary (Skin) Denies history of History of Burn, History of pressure wounds Musculoskeletal Patient has history of Osteoarthritis Denies history of Gout, Rheumatoid Arthritis, Osteomyelitis Neurologic Denies history of Dementia, Neuropathy, Quadriplegia,  Paraplegia, Seizure Disorder Oncologic Patient has history of Received Chemotherapy Psychiatric Denies history of Anorexia/bulimia, Confinement Anxiety Medical And Surgical History Notes Constitutional Symptoms (General Health) Defibulator; Cancer Stage III; HTN; High Cholesterol, Oncologic Breast Cancer-Only 2 treatment of Chemo one in 2012; one in 2018 Review of Systems (ROS) Constitutional Symptoms (General Health) Complains or has symptoms of Fatigue. Denies complaints or symptoms of Fever, Chills, Marked Weight Change. Eyes The patient has no complaints or symptoms. Ear/Nose/Mouth/Throat The patient has no complaints or symptoms. Hematologic/Lymphatic The patient has no complaints or symptoms. Respiratory The patient has no complaints or symptoms. Cardiovascular The patient has no complaints or symptoms. Gastrointestinal The patient has no complaints or symptoms. Endocrine The patient has no complaints or symptoms. Genitourinary The patient has no  complaints or symptoms. Immunological Christine [name], Christine [name] ([id number]) The patient has no complaints or symptoms. Integumentary (Skin) Complains or has symptoms of Wounds. Denies complaints or symptoms of Bleeding or bruising tendency, Breakdown, Swelling. Musculoskeletal Complains or has symptoms of Muscle Pain, Muscle Weakness. Neurologic Complains or has symptoms of Numbness/parasthesias - left leg. Psychiatric Complains or has symptoms of Anxiety. Denies complaints or symptoms of Claustrophobia. Objective Constitutional sitting or standing blood pressure is within target range for patient.. pulse regular and within target range for patient.Marland Kitchen respirations regular, non-labored and within target range for patient.Marland Kitchen temperature within target range for patient.. Well- nourished and well-hydrated in no acute distress. Vitals Time Taken: 1:20 PM, Height: 62 in, Weight: 89.5 lbs, BMI: 16.4, Temperature: 98.4 F, Pulse: 60 bpm, Respiratory Rate: 16 breaths/min, Blood Pressure: 129/76 mmHg. Eyes conjunctiva clear no eyelid edema noted. pupils equal round and reactive to light and accommodation. Ears, Nose, Mouth, and Throat no gross abnormality of ear auricles or external auditory canals. normal hearing noted during conversation. mucus membranes moist. Respiratory normal breathing without difficulty. clear to auscultation bilaterally. Cardiovascular regular rate and rhythm with normal S1, S2. no clubbing, cyanosis, significant edema, Gastrointestinal (GI) soft, non-tender, non-distended, +BS. no ventral hernia noted. Musculoskeletal normal gait and posture. no significant deformity or arthritic changes, no loss or range of motion, no clubbing. Psychiatric this patient is able to make decisions and demonstrates good insight into disease process. Alert and Oriented x 3. pleasant and cooperative. General Notes: On inspection today patient actually appears to be doing rather well over at this  point in time which is good news. She also does not seem to have any evidence of purulent discharge which is also good news. With that being said I explained to the patient that I was he would not perform any debridement being that this is a cancerous ulcer. We also would not recommend aggressive therapy again due to the nature of the ulcer as well. Nonetheless I do believe that we can conservatively attend to help manage this at this point she understands completely. Fortunately there's no evidence of Christine [name], Christine [name]. ([id number]) infection at this time. Integumentary (Hair, Skin) Wound #1 status is Open. Original cause of wound was Gradually Appeared. The wound is located on the Left Breast. The wound measures 5.2cm length x 4cm width x 0.1cm depth; 16.336cm^2 area and 1.634cm^3 volume. There is Fat Layer (Subcutaneous Tissue) Exposed exposed. There is no tunneling or undermining noted. There is a medium amount of serosanguineous drainage noted. The wound margin is flat and intact. There is no granulation within the wound bed. There is a large (67-100%) amount of necrotic tissue within the wound bed including Eschar and Adherent Slough. The periwound skin appearance did  not exhibit: Callus, Crepitus, Excoriation, Induration, Rash, Scarring, Dry/Scaly, Maceration, Atrophie Blanche, Cyanosis, Ecchymosis, Hemosiderin Staining, Mottled, Pallor, Rubor, Erythema. The periwound has tenderness on palpation. Assessment Active Problems ICD-10 Malignant neoplasm of unspecified site of left female breast Non-pressure chronic ulcer of skin of other sites with fat layer exposed Tobacco use Essential (primary) hypertension Plan Wound Cleansing: Wound #1 Left Breast: May Shower, gently pat wound dry prior to applying new dressing. Primary Wound Dressing: Wound #1 Left Breast: Silver Alginate Secondary Dressing: Wound #1 Left Breast: [place] - or bandaid Dressing Change Frequency: Wound #1 Left  Breast: Change dressing every day. Follow-up Appointments: Wound #1 Left Breast: Return Appointment in 2 weeks. Currently I did discuss with the patient the pallet of nature of what we would be doing in order to take care of her wound. She completely understands. With that being said I do think silver alginate would be the best treatment choice per above. Again she's in agreement with this plan. We will subsequently see her back for reevaluation in two weeks time just to see were things stand she doing well that point that we may spread out that treatment follow-up going forward. She's in agreement with that as well. If anything changes or worsens she will contact the office for additional recommendations. Christine [name], Christine [name] ([id number]) Please see above for specific wound care orders. We will see patient for re-evaluation in 2 week(s) here in the clinic. If anything worsens or changes patient will contact our office for additional recommendations. Electronic Signature(s) Signed: 03/02/2018 4:58:18 PM By: Worthy Keeler PA-C Entered By: Worthy Keeler on 03/02/2018 16:57:15 Christine [name] ([id number]) -------------------------------------------------------------------------------- ROS/PFSH Details Patient Name: Christine [name], Christine [name]. Date of Service: 03/02/2018 1:00 PM Medical Record Number: [id number] Patient Account Number: [id number] Date of Birth/Sex: 02-18-1946 (72 y.o. Female) Treating RN: Cornell Barman Primary Care Provider: Glendon Axe Other Clinician: Referring Provider: Lavonia Drafts Treating Provider/Extender: Melburn Hake, HOYT Weeks in Treatment: 0 Information Obtained From Patient Wound History Do you currently have one or more open woundso Yes How many open wounds do you currently haveo 1 Approximately how long have you had your woundso 1.8 months How have you been treating your wound(s) until nowo neosporin Has your wound(s) ever healed and then re-openedo No Have you had any  lab work done in the past montho No Have you tested positive for an antibiotic resistant organism (MRSA, VRE)o No Constitutional Symptoms (General Health) Complaints and Symptoms: Positive for: Fatigue Negative for: Fever; Chills; Marked Weight Change Medical History: Past Medical History Notes: Defibulator; Cancer Stage III; HTN; High Cholesterol, Integumentary (Skin) Complaints and Symptoms: Positive for: Wounds Negative for: Bleeding or bruising tendency; Breakdown; Swelling Medical History: Negative for: History of Burn; History of pressure wounds Musculoskeletal Complaints and Symptoms: Positive for: Muscle Pain; Muscle Weakness Medical History: Positive for: Osteoarthritis Negative for: Gout; Rheumatoid Arthritis; Osteomyelitis Neurologic Complaints and Symptoms: Positive for: Numbness/parasthesias - left leg Medical History: Negative for: Dementia; Neuropathy; Quadriplegia; Paraplegia; Seizure Disorder Psychiatric Christine [name], Christine [name] ([id number]) Complaints and Symptoms: Positive for: Anxiety Negative for: Claustrophobia Medical History: Negative for: Anorexia/bulimia; Confinement Anxiety Eyes Complaints and Symptoms: No Complaints or Symptoms Medical History: Positive for: Cataracts Negative for: Glaucoma; Optic Neuritis Ear/Nose/Mouth/Throat Complaints and Symptoms: No Complaints or Symptoms Medical History: Negative for: Chronic sinus problems/congestion; Middle ear problems Hematologic/Lymphatic Complaints and Symptoms: No Complaints or Symptoms Medical History: Negative for: Anemia; Hemophilia; Human Immunodeficiency Virus; Lymphedema; Sickle Cell Disease Respiratory Complaints and Symptoms: No Complaints or Symptoms Medical  History: Negative for: Aspiration; Asthma; Chronic Obstructive Pulmonary Disease (COPD); Pneumothorax; Sleep Apnea; Tuberculosis Cardiovascular Complaints and Symptoms: No Complaints or Symptoms Medical History: Positive for:  Congestive Heart Failure; Coronary Artery Disease; Hypertension; Myocardial Infarction; Peripheral Arterial Disease Negative for: Angina; Arrhythmia; Deep Vein Thrombosis; Hypotension; Peripheral Venous Disease; Phlebitis; Vasculitis Gastrointestinal Complaints and Symptoms: No Complaints or Symptoms Medical History: Negative for: Cirrhosis ; Colitis; Crohnos; Hepatitis A; Hepatitis B ARYN, SAFRAN ([id number]) Endocrine Complaints and Symptoms: No Complaints or Symptoms Medical History: Negative for: Type I Diabetes; Type II Diabetes Genitourinary Complaints and Symptoms: No Complaints or Symptoms Medical History: Negative for: End Stage Renal Disease Immunological Complaints and Symptoms: No Complaints or Symptoms Medical History: Negative for: Lupus Erythematosus; Raynaudos; Scleroderma Oncologic Medical History: Positive for: Received Chemotherapy Past Medical History Notes: Breast Cancer-Only 2 treatment of Chemo one in 2012; one in 2018 HBO Extended History Items Eyes: Cataracts Immunizations Pneumococcal Vaccine: Received Pneumococcal Vaccination: Yes Implantable Devices Family and Social History Cancer: No; Diabetes: No; Heart Disease: Yes - Father,Paternal Grandparents; Hypertension: Yes - Father,Child; Kidney Disease: No; Lung Disease: No; Seizures: No; Stroke: Yes - Mother; Thyroid Problems: No; Tuberculosis: No; Current every day smoker; Marital Status - Divorced; Alcohol Use: Never; Drug Use: No History; Caffeine Use: Daily; Advanced Directives: No; Patient does not want information on Advanced Directives; Living Will: No; Medical Power of Attorney: No Electronic Signature(s) Signed: 03/02/2018 4:58:18 PM By: Worthy Keeler PA-C Signed: 03/02/2018 6:14:11 PM By: Gretta Cool, BSN, RN, CWS, Kim RN, BSN Entered By: Gretta Cool, BSN, RN, CWS, Kim on 03/02/2018 13:47:11 Christine [name]  ([id number]) -------------------------------------------------------------------------------- [place] Details Patient Name: KELLIN, FIFER. Date of Service: 03/02/2018 Medical Record Number: [id number] Patient Account Number: [id number] Date of Birth/Sex: 1946-06-16 (72 y.o. Female) Treating RN: Montey Hora Primary Care Provider: Glendon Axe Other Clinician: Referring Provider: Lavonia Drafts Treating Provider/Extender: STONE III, HOYT Weeks in Treatment: 0 Diagnosis Coding ICD-10 Codes Code Description C50.912 Malignant neoplasm of unspecified site of left female breast L98.492 Non-pressure chronic ulcer of skin of other sites with fat layer exposed Z72.0 Tobacco use I10 Essential (primary) hypertension Facility Procedures CPT4 Code: [id number] Description: 99213 - WOUND CARE VISIT-LEV 3 EST PT Modifier: Quantity: 1 Physician Procedures CPT4 Code: [id number] Description: WC PHYS LEVEL 3 o NEW PT ICD-10 Diagnosis Description C50.912 Malignant neoplasm of unspecified site of left female brea 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

## 2018-03-07 NOTE — Progress Notes (Signed)
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  INTERVAL HISTORY: Patient returns to clinic today for further evaluation.  She only took 3 days worth of letrozole, but then discontinue treatment secondary to perceived side effects.  She currently feels well and is asymptomatic.  She continues to have tenderness at the site where her malignancy has ulcerated through the skin. She has no neurologic complaints. She denies any recent fevers or illnesses. She has a good appetite and denies weight loss.  She denies any chest pain, hemoptysis, cough, or shortness of breath.  She has no nausea, vomiting, constipation, or diarrhea. She has no urinary complaints.  Patient offers no further specific complaints today.  REVIEW OF SYSTEMS:   Review of Systems  Constitutional: Negative.  Negative for fever, malaise/fatigue and weight loss.  Respiratory: Negative.  Negative for cough and shortness of breath.   Cardiovascular: Negative.  Negative for chest pain and leg swelling.  Gastrointestinal: Negative.  Negative for abdominal pain.  Genitourinary: Negative.  Negative for dysuria.  Musculoskeletal: Negative.  Negative for back pain.  Skin: Negative.  Negative for rash.  Neurological: Negative.  Negative for dizziness, focal weakness, weakness and headaches.  Psychiatric/Behavioral: The patient is nervous/anxious.     As per HPI. Otherwise, a complete review of systems is negative.  PAST MEDICAL HISTORY: Past Medical History:  Diagnosis Date  . Allergic rhinitis   . Anxiety   . Breast cancer ([place])   . Chronic systolic heart failure ([place])   . Coronary artery disease   . GERD (gastroesophageal  reflux disease)   . Heart murmur   . Hyperlipidemia   . Hypertension   . Implantable defibrillator [employer]    DOI 2006  . MI (myocardial infarction) ([place])   . Osteoarthritis     PAST SURGICAL HISTORY: Past Surgical History:  Procedure Laterality Date  . CHF exacerbation  10/07  . CORONARY STENT PLACEMENT  8/06   3 stents; myocardial infarction  . NM MYOVIEW LTD  1/08   EF 39%, no sx ischemia  . PACEMAKER PLACEMENT  2/07   Defibrillator    FAMILY HISTORY: Family History  Problem Relation Age of Onset  . Stroke Mother 77  . Heart attack Father   . Coronary artery disease Father   . Heart attack Brother   . Coronary artery disease Other   . Hypertension Other   . Cancer Neg Hx        No breast or colon cancer    ADVANCED DIRECTIVES (Y/N):  N  HEALTH MAINTENANCE: Social History   Tobacco Use  . Smoking status: Current Every Day Smoker    Packs/day: 0.50  . Smokeless tobacco: Never Used  . Tobacco comment: also uses E-cig  Substance Use Topics  . Alcohol use: No    Comment: 2-3 times per month  . Drug use: No     Colonoscopy:  PAP:  Bone density:  Lipid panel:  Allergies  Allergen Reactions  . Atorvastatin     REACTION: increased LFT's  . Lisinopril     REACTION: cough  . Oxycodone Hcl     REACTION: unspecified  . Simvastatin     REACTION: myalgias    Current Outpatient Medications  Medication Sig Dispense Refill  . albuterol (VENTOLIN HFA) 108 (90 Base) MCG/ACT inhaler INHALE 2 PUFFS BY MOUTH 4 TIMES A DAY ASNEEDED    . aspirin 81 MG tablet Take 81 mg by mouth daily.    . bisacodyl (DULCOLAX) 5 MG EC tablet Take 5 mg by mouth daily as needed for mild constipation or moderate constipation.    . carvedilol (COREG) 25 MG tablet TAKE 1 TABLET BY MOUTH TWICE A DAY 60 tablet 2  . cetirizine (ZYRTEC) 10 MG tablet TAKE 1 TABLET BY MOUTH ONCE DAILY    . Cholecalciferol (VITAMIN D) 2000 UNITS tablet Take 2,000 Units by mouth daily.    Marland Kitchen dicyclomine  (BENTYL) 20 MG tablet Take by mouth.    . furosemide (LASIX) 40 MG tablet Take 40 mg by mouth as needed.    Marland Kitchen letrozole (FEMARA) 2.5 MG tablet Take 1 tablet (2.5 mg total) by mouth daily. 90 tablet 3  . lidocaine-prilocaine (EMLA) cream Apply to affected area once 30 g 3  . loratadine (CLARITIN) 10 MG tablet Take by mouth.    Marland Kitchen LORazepam (ATIVAN) 0.5 MG tablet Take 0.5 mg by mouth 2 (two) times daily.     . Multiple Vitamin (MULTIVITAMIN) tablet Take 1 tablet by mouth daily.    . Omega-3 Fatty Acids (OMEGA 3 PO) Take by mouth daily.    Marland Kitchen omeprazole (PRILOSEC) 20 MG capsule Take 20 mg by mouth 2 (two) times daily.      . pantoprazole (PROTONIX) 40 MG tablet TAKE 1 TABLET BY MOUTH ONCE DAILY    . Polyethylene Glycol 3350 (PEG 3350) POWD Take by mouth.    . rosuvastatin (CRESTOR) 10 MG tablet Take 10 mg by mouth at bedtime.      . traMADol (ULTRAM) 50 MG tablet TAKE 1 TABLET BY MOUTH EVERY 8 HOURS AS NEEDED PAIN    . valsartan (DIOVAN) 320 MG tablet Take 320 mg by mouth daily.    Marland Kitchen HYDROcodone-acetaminophen (NORCO/VICODIN) 5-325 MG tablet Take 1 tablet by mouth every 6 (six) hours as needed for moderate pain. (Patient not taking: Reported on 03/09/2018) 30 tablet 0  . tamoxifen (NOLVADEX) 20 MG tablet Take 1 tablet (20 mg total) by mouth daily. 30 tablet 3  . traMADol (ULTRAM) 50 MG tablet Take 50 mg by mouth every 6 (six) hours as needed for moderate pain.      No current facility-administered medications for this visit.     OBJECTIVE: Vitals:   03/09/18 1058  BP: 115/72  Pulse: 61  Temp: (!) 96.7 F (35.9 C)     Body mass index is 16.52 kg/m.    ECOG FS:0 - Asymptomatic  General: Well-developed, well-nourished, no acute distress. Eyes: Pink conjunctiva, anicteric sclera. HEENT: Normocephalic, moist mucous membranes. Breast: Approximately 3 to 4 cm mass with significant skin ulceration and breakdown. Palpable left axillary lymphadenopathy. Lungs: Clear to auscultation  bilaterally. Heart: Regular rate and rhythm. No rubs, murmurs, or gallops. Abdomen: Soft, nontender, nondistended. No organomegaly noted, normoactive bowel sounds. Musculoskeletal: No edema, cyanosis, or clubbing. Neuro: Alert, answering all questions appropriately. Cranial nerves grossly intact. Skin: No rashes or petechiae noted. Psych: Normal affect.   LAB RESULTS:  Lab Results  Component Value Date   NA 139 03/09/2018   K 5.1 03/09/2018   CL 103 03/09/2018   CO2 27 03/09/2018   GLUCOSE 89 03/09/2018   BUN 10 03/09/2018   CREATININE 0.87 03/09/2018   CALCIUM 9.6 03/09/2018   PROT 6.9 03/09/2018  ALBUMIN 4.2 03/09/2018   AST 23 03/09/2018   ALT 11 03/09/2018   ALKPHOS 82 03/09/2018   BILITOT 0.7 03/09/2018   GFRNONAA >60 03/09/2018   GFRAA >60 03/09/2018    Lab Results  Component Value Date   WBC 5.9 03/09/2018   NEUTROABS 3.4 03/09/2018   HGB 12.8 03/09/2018   HCT 38.0 03/09/2018   MCV 91.5 03/09/2018   PLT 127 (L) 03/09/2018     STUDIES: No results found.  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  PLAN:    1. 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 She received one dose of chemotherapy using Taxotere and Cytoxan in 2013. She did not receive any XRT or hormonal treatment. PET scan results from July 08, 2016 reviewed independently possibly suggesting metastatic lesions in her lung, but these are too small to characterize.  This will need to be repeated in the near future.  Patient received 1 dose of Taxotere and Cytoxan again on July 30, 2016, but then did not return for any further follow-up.  Patient admits she is hesitant to undergo surgery or chemotherapy, and given her history of compliance is unclear if XRT would offer any  benefit.  She only took 3 days of letrozole and then discontinue treatment secondary to side effects.  She did agree to try tamoxifen and was given a prescription today.  Return to clinic in 4 weeks for further evaluation and to assess her toleration of tamoxifen.    2.  Breast wound: Patient was previously given a referral to wound care clinic for evaluation and treatment.  I spent a total of 30 minutes face-to-face with the patient of which greater than 50% of the visit was spent in counseling and coordination of care as detailed above.   Patient expressed understanding and was in agreement with this plan. She also understands that She can call clinic at any time with any questions, concerns, or complaints.   Cancer Staging Breast cancer of upper-inner quadrant of right female breast [facility]) Staging form: Breast, AJCC 8th Edition - Clinical: Stage IIIB (cT4b, cN1, cM0, G2, ER: Positive, PR: Positive, HER2: Negative) - Signed by Lloyd Huger, MD on 07/01/2016   Lloyd Huger, MD   03/13/2018 6:12 AM

## 2018-03-09 ENCOUNTER — Other Ambulatory Visit: Payer: Self-pay

## 2018-03-09 ENCOUNTER — Encounter: Payer: Self-pay | Admitting: Oncology

## 2018-03-09 ENCOUNTER — Other Ambulatory Visit: Payer: Self-pay | Admitting: *Deleted

## 2018-03-09 ENCOUNTER — Inpatient Hospital Stay (HOSPITAL_BASED_OUTPATIENT_CLINIC_OR_DEPARTMENT_OTHER): Payer: Medicare Other | Admitting: Oncology

## 2018-03-09 ENCOUNTER — Inpatient Hospital Stay: Payer: Medicare Other | Attending: Oncology

## 2018-03-09 VITALS — BP 115/72 | HR 61 | Temp 96.7°F | Wt 90.3 lb

## 2018-03-09 DIAGNOSIS — S21001A Unspecified open wound of right breast, initial encounter: Secondary | ICD-10-CM

## 2018-03-09 DIAGNOSIS — C50211 Malignant neoplasm of upper-inner quadrant of right female breast: Secondary | ICD-10-CM | POA: Insufficient documentation

## 2018-03-09 DIAGNOSIS — Z170 Estrogen receptor positive status [ER+]: Secondary | ICD-10-CM

## 2018-03-09 DIAGNOSIS — C50919 Malignant neoplasm of unspecified site of unspecified female breast: Secondary | ICD-10-CM

## 2018-03-09 LAB — CBC WITH DIFFERENTIAL/PLATELET
BASOS ABS: 0 10*3/uL (ref 0–0.1)
BASOS PCT: 1 %
EOS ABS: 0.3 10*3/uL (ref 0–0.7)
Eosinophils Relative: 6 %
HEMATOCRIT: 38 % (ref 35.0–47.0)
Hemoglobin: 12.8 g/dL (ref 12.0–16.0)
Lymphocytes Relative: 28 %
Lymphs Abs: 1.6 10*3/uL (ref 1.0–3.6)
MCH: 30.9 pg (ref 26.0–34.0)
MCHC: 33.8 g/dL (ref 32.0–36.0)
MCV: 91.5 fL (ref 80.0–100.0)
MONO ABS: 0.5 10*3/uL (ref 0.2–0.9)
Monocytes Relative: 8 %
NEUTROS ABS: 3.4 10*3/uL (ref 1.4–6.5)
Neutrophils Relative %: 57 %
PLATELETS: 127 10*3/uL — AB (ref 150–440)
RBC: 4.15 MIL/uL (ref 3.80–5.20)
RDW: 15.3 % — AB (ref 11.5–14.5)
WBC: 5.9 10*3/uL (ref 3.6–11.0)

## 2018-03-09 LAB — COMPREHENSIVE METABOLIC PANEL
ALBUMIN: 4.2 g/dL (ref 3.5–5.0)
ALT: 11 U/L (ref 0–44)
ANION GAP: 9 (ref 5–15)
AST: 23 U/L (ref 15–41)
Alkaline Phosphatase: 82 U/L (ref 38–126)
BILIRUBIN TOTAL: 0.7 mg/dL (ref 0.3–1.2)
BUN: 10 mg/dL (ref 8–23)
CALCIUM: 9.6 mg/dL (ref 8.9–10.3)
CO2: 27 mmol/L (ref 22–32)
CREATININE: 0.87 mg/dL (ref 0.44–1.00)
Chloride: 103 mmol/L (ref 98–111)
GFR calc Af Amer: >60 mL/min (ref >60)
GFR calc non Af Amer: >60 mL/min (ref >60)
GLUCOSE: 89 mg/dL (ref 70–99)
Potassium: 5.1 mmol/L (ref 3.5–5.1)
SODIUM: 139 mmol/L (ref 135–145)
Total Protein: 6.9 g/dL (ref 6.5–8.1)

## 2018-03-09 MED ORDER — TAMOXIFEN CITRATE 20 MG PO TABS: 20.0000 mg | ORAL_TABLET | Freq: Every day | ORAL | 3 refills | Status: DC

## 2018-03-10 LAB — CANCER ANTIGEN 27.29: CAN 27.29: 13 U/mL (ref 0.0–38.6)

## 2018-03-16 ENCOUNTER — Encounter: Payer: Medicare Other | Attending: Physician Assistant | Admitting: Physician Assistant

## 2018-03-16 DIAGNOSIS — L98492 Non-pressure chronic ulcer of skin of other sites with fat layer exposed: Secondary | ICD-10-CM | POA: Insufficient documentation

## 2018-03-16 DIAGNOSIS — Z8249 Family history of ischemic heart disease and other diseases of the circulatory system: Secondary | ICD-10-CM | POA: Insufficient documentation

## 2018-03-16 DIAGNOSIS — C50212 Malignant neoplasm of upper-inner quadrant of left female breast: Secondary | ICD-10-CM | POA: Diagnosis not present

## 2018-03-16 DIAGNOSIS — I252 Old myocardial infarction: Secondary | ICD-10-CM | POA: Diagnosis not present

## 2018-03-16 DIAGNOSIS — I2510 Atherosclerotic heart disease of native coronary artery without angina pectoris: Secondary | ICD-10-CM | POA: Insufficient documentation

## 2018-03-16 DIAGNOSIS — Z170 Estrogen receptor positive status [ER+]: Secondary | ICD-10-CM | POA: Diagnosis not present

## 2018-03-16 DIAGNOSIS — I110 Hypertensive heart disease with heart failure: Secondary | ICD-10-CM | POA: Diagnosis not present

## 2018-03-16 DIAGNOSIS — M1990 Unspecified osteoarthritis, unspecified site: Secondary | ICD-10-CM | POA: Diagnosis not present

## 2018-03-16 DIAGNOSIS — F17200 Nicotine dependence, unspecified, uncomplicated: Secondary | ICD-10-CM | POA: Diagnosis not present

## 2018-03-16 DIAGNOSIS — I739 Peripheral vascular disease, unspecified: Secondary | ICD-10-CM | POA: Diagnosis not present

## 2018-03-16 DIAGNOSIS — Z9221 Personal history of antineoplastic chemotherapy: Secondary | ICD-10-CM | POA: Insufficient documentation

## 2018-03-16 DIAGNOSIS — I509 Heart failure, unspecified: Secondary | ICD-10-CM | POA: Insufficient documentation

## 2018-03-20 NOTE — Progress Notes (Signed)
Christine, [name] ([id number]) Visit Report for 03/16/2018 Arrival Information Details Patient Name: Christine [name], Christine [name]. Date of Service: 03/16/2018 11:15 AM Medical Record Number: [id number] Patient Account Number: [id number] Date of Birth/Sex: Feb 10, 1946 (72 y.o. F) Treating RN: Montey Hora Primary Care Sherion Dooly: Glendon Axe Other Clinician: Referring Kymari Nuon: Glendon Axe Treating Harvir Patry/Extender: STONE III, HOYT Weeks in Treatment: 2 Visit Information History Since Last Visit Added or deleted any medications: Yes Patient Arrived: Ambulatory Any new allergies or adverse reactions: No Arrival Time: 11:17 Had a fall or experienced change in No Accompanied By: self activities of daily living that may affect Transfer Assistance: None risk of falls: Patient Identification Verified: Yes Signs or symptoms of abuse/neglect since last visito No Secondary Verification Process Completed: Yes Hospitalized since last visit: No Has Dressing in Place as Prescribed: Yes Pain Present Now: No Electronic Signature(s) Signed: 03/16/2018 3:15:29 PM By: Lorine Bears RCP, RRT, CHT Entered By: Lorine Bears on 03/16/2018 11:18:37 Christine [name] ([id number]) -------------------------------------------------------------------------------- Clinic Level of Care Assessment Details Patient Name: Christine [name]. Date of Service: 03/16/2018 11:15 AM Medical Record Number: [id number] Patient Account Number: [id number] Date of Birth/Sex: 13-Dec-1945 (72 y.o. F) Treating RN: Montey Hora Primary Care Kanae Ignatowski: Glendon Axe Other Clinician: Referring Melodie Ashworth: Glendon Axe Treating Amarylis Rovito/Extender: STONE III, HOYT Weeks in Treatment: 2 Clinic Level of Care Assessment Items TOOL 4 Quantity Score '[]'  - Use when only an EandM is performed on FOLLOW-UP visit 0 ASSESSMENTS - Nursing Assessment / Reassessment X - Reassessment of Co-morbidities (includes updates in patient  status) 1 10 X- 1 5 Reassessment of Adherence to Treatment Plan ASSESSMENTS - Wound and Skin Assessment / Reassessment X - Simple Wound Assessment / Reassessment - one wound 1 5 '[]'  - 0 Complex Wound Assessment / Reassessment - multiple wounds '[]'  - 0 Dermatologic / Skin Assessment (not related to wound area) ASSESSMENTS - Focused Assessment '[]'  - Circumferential Edema Measurements - multi extremities 0 '[]'  - 0 Nutritional Assessment / Counseling / Intervention '[]'  - 0 Lower Extremity Assessment (monofilament, tuning fork, pulses) '[]'  - 0 Peripheral Arterial Disease Assessment (using hand held doppler) ASSESSMENTS - Ostomy and/or Continence Assessment and Care '[]'  - Incontinence Assessment and Management 0 '[]'  - 0 Ostomy Care Assessment and Management (repouching, etc.) PROCESS - Coordination of Care X - Simple Patient / Family Education for ongoing care 1 15 '[]'  - 0 Complex (extensive) Patient / Family Education for ongoing care '[]'  - 0 Staff obtains Programmer, systems, Records, Test Results / Process Orders '[]'  - 0 Staff telephones HHA, Nursing Homes / Clarify orders / etc '[]'  - 0 Routine Transfer to another Facility (non-emergent condition) '[]'  - 0 Routine Hospital Admission (non-emergent condition) '[]'  - 0 New Admissions / Biomedical engineer / Ordering NPWT, Apligraf, etc. '[]'  - 0 Emergency Hospital Admission (emergent condition) X- 1 10 Simple Discharge Coordination LUVIA, ORZECHOWSKI ([id number]) '[]'  - 0 Complex (extensive) Discharge Coordination PROCESS - Special Needs '[]'  - Pediatric / Minor Patient Management 0 '[]'  - 0 Isolation Patient Management '[]'  - 0 Hearing / Language / Visual special needs '[]'  - 0 Assessment of Community assistance (transportation, D/C planning, etc.) '[]'  - 0 Additional assistance / Altered mentation '[]'  - 0 Support Surface(s) Assessment (bed, cushion, seat, etc.) INTERVENTIONS - Wound Cleansing / Measurement X - Simple Wound Cleansing - one wound 1 5 '[]'  -  0 Complex Wound Cleansing - multiple wounds X- 1 5 Wound Imaging (photographs - any number of wounds) '[]'  - 0 Wound Tracing (instead of photographs)  X- 1 5 Simple Wound Measurement - one wound '[]'  - 0 Complex Wound Measurement - multiple wounds INTERVENTIONS - Wound Dressings X - Small Wound Dressing one or multiple wounds 1 10 '[]'  - 0 Medium Wound Dressing one or multiple wounds '[]'  - 0 Large Wound Dressing one or multiple wounds '[]'  - 0 Application of Medications - topical '[]'  - 0 Application of Medications - injection INTERVENTIONS - Miscellaneous '[]'  - External ear exam 0 '[]'  - 0 Specimen Collection (cultures, biopsies, blood, body fluids, etc.) '[]'  - 0 Specimen(s) / Culture(s) sent or taken to Lab for analysis '[]'  - 0 Patient Transfer (multiple staff / Civil Service fast streamer / Similar devices) '[]'  - 0 Simple Staple / Suture removal (25 or less) '[]'  - 0 Complex Staple / Suture removal (26 or more) '[]'  - 0 Hypo / Hyperglycemic Management (close monitor of Blood Glucose) '[]'  - 0 Ankle / Brachial Index (ABI) - do not check if billed separately X- 1 5 Vital Signs ALEXYIA, GUARINO ([id number]) Has the patient been seen at the hospital within the last three years: Yes Total Score: 75 Level Of Care: New/Established - Level 2 Electronic Signature(s) Signed: 03/16/2018 5:00:45 PM By: Montey Hora Entered By: Montey Hora on 03/16/2018 12:40:19 Christine [name] ([id number]) -------------------------------------------------------------------------------- Encounter Discharge Information Details Patient Name: Christine, [name]. Date of Service: 03/16/2018 11:15 AM Medical Record Number: [id number] Patient Account Number: [id number] Date of Birth/Sex: Nov 15, 1945 (72 y.o. F) Treating RN: Montey Hora Primary Care Jaemarie Hochberg: Glendon Axe Other Clinician: Referring Olaf Mesa: Glendon Axe Treating Dayanis Bergquist/Extender: Melburn Hake, HOYT Weeks in Treatment: 2 Encounter Discharge Information  Items Discharge Condition: Stable Ambulatory Status: Ambulatory Discharge Destination: Home Transportation: Private Auto Accompanied By: self Schedule Follow-up Appointment: Yes Clinical Summary of Care: Electronic Signature(s) Signed: 03/16/2018 5:00:45 PM By: Montey Hora Entered By: Montey Hora on 03/16/2018 12:41:42 Christine [name] ([id number]) -------------------------------------------------------------------------------- Lower Extremity Assessment Details Patient Name: Christine [name]. Date of Service: 03/16/2018 11:15 AM Medical Record Number: [id number] Patient Account Number: [id number] Date of Birth/Sex: [birth year] (72 y.o. F) Treating RN: Cornell Barman Primary Care Jearlene Bridwell: Glendon Axe Other Clinician: Referring Shaylynn Nulty: Glendon Axe Treating Marlenne Ridge/Extender: Melburn Hake, HOYT Weeks in Treatment: 2 Electronic Signature(s) Signed: 03/16/2018 4:33:57 PM By: Gretta Cool, BSN, RN, CWS, Kim RN, BSN Entered By: Gretta Cool, BSN, RN, CWS, Kim on 03/16/2018 11:25:08 Christine [name] ([id number]) -------------------------------------------------------------------------------- Multi Wound Chart Details Patient Name: SHAWNTEE, MAINWARING. Date of Service: 03/16/2018 11:15 AM Medical Record Number: [id number] Patient Account Number: [id number] Date of Birth/Sex: Oct 22, 1945 (72 y.o. F) Treating RN: Montey Hora Primary Care Seraphim Trow: Glendon Axe Other Clinician: Referring Hester Joslin: Glendon Axe Treating Danarius Mcconathy/Extender: STONE III, HOYT Weeks in Treatment: 2 Vital Signs Height(in): 62 Pulse(bpm): 55 Weight(lbs): 88.1 Blood Pressure(mmHg): 121/71 Body Mass Index(BMI): 16 Temperature(F): 98.0 Respiratory Rate 16 (breaths/min): Photos: [1:No Photos] [N/A:N/A] Wound Location: [1:Left Breast] [N/A:N/A] Wounding Event: [1:Gradually Appeared] [N/A:N/A] Primary Etiology: [1:Malignant Wound] [N/A:N/A] Comorbid History: [1:Cataracts, Congestive Heart Failure, Coronary Artery Disease,  Hypertension, Myocardial Infarction, Peripheral Arterial Disease, Osteoarthritis, Received Chemotherapy] [N/A:N/A] Date Acquired: [1:06/16/2016] [N/A:N/A] Weeks of Treatment: [1:2] [N/A:N/A] Wound Status: [1:Open] [N/A:N/A] Measurements L x W x D [1:5x4x0.1] [N/A:N/A] (cm) Area (cm) : [1:15.708] [N/A:N/A] Volume (cm) : [1:1.571] [N/A:N/A] % Reduction in Area: [1:3.80%] [N/A:N/A] % Reduction in Volume: [1:3.90%] [N/A:N/A] Classification: [1:Full Thickness Without Exposed Support Structures] [N/A:N/A] Exudate Amount: [1:Medium] [N/A:N/A] Exudate Type: [1:Serosanguineous] [N/A:N/A] Exudate Color: [1:red, brown] [N/A:N/A] Wound Margin: [1:Flat and Intact] [N/A:N/A] Granulation Amount: [1:None Present (0%)] [N/A:N/A] Necrotic Amount: [1:Large (67-100%)] [N/A:N/A]  Necrotic Tissue: [1:Eschar, Adherent Slough] [N/A:N/A] Exposed Structures: [1:Fat Layer (Subcutaneous Tissue) Exposed: Yes Fascia: No Tendon: No Muscle: No Joint: No Bone: No] [N/A:N/A] Epithelialization: [1:None] [N/A:N/A] Periwound Skin Texture: Scarring: Yes N/A N/A Excoriation: No Induration: No Callus: No Crepitus: No Rash: No Periwound Skin Moisture: Maceration: No N/A N/A Dry/Scaly: No Periwound Skin Color: Atrophie Blanche: No N/A N/A Cyanosis: No Ecchymosis: No Erythema: No Hemosiderin Staining: No Mottled: No Pallor: No Rubor: No Tenderness on Palpation: Yes N/A N/A Wound Preparation: Ulcer Cleansing: N/A N/A Rinsed/Irrigated with Saline Topical Anesthetic Applied: None Assessment Notes: Fungating tumor N/A N/A Treatment Notes Electronic Signature(s) Signed: 03/16/2018 5:00:45 PM By: Montey Hora Entered By: Montey Hora on 03/16/2018 12:00:36 Christine [name] ([id number]) -------------------------------------------------------------------------------- Advance Details Patient Name: CATELIN, MANTHE. Date of Service: 03/16/2018 11:15 AM Medical Record Number: [id number] Patient  Account Number: [id number] Date of Birth/Sex: [birth year] (72 y.o. F) Treating RN: Montey Hora Primary Care Shekinah Pitones: Glendon Axe Other Clinician: Referring Stepehn Eckard: Glendon Axe Treating Zyona Pettaway/Extender: STONE III, HOYT Weeks in Treatment: 2 Active Inactive ` Abuse / Safety / Falls / Self Care Management Nursing Diagnoses: Potential for falls Goals: Patient will remain injury free related to falls Date Initiated: 03/02/2018 Target Resolution Date: 05/14/2018 Goal Status: Active Interventions: Assess fall risk on admission and as needed Notes: ` Orientation to the Wound Care Program Nursing Diagnoses: Knowledge deficit related to the wound healing center program Goals: Patient/caregiver will verbalize understanding of the [place] Program Date Initiated: 03/02/2018 Target Resolution Date: 05/14/2018 Goal Status: Active Interventions: Provide education on orientation to the wound center Notes: ` Pain, Acute or Chronic Nursing Diagnoses: Pain, acute or chronic: actual or potential Goals: Patient/caregiver will verbalize comfort level met Date Initiated: 03/02/2018 Target Resolution Date: 05/14/2018 Goal Status: Active Interventions: NILAH, BELCOURT ([id number]) Assess comfort goal upon admission Notes: ` Wound/Skin Impairment Nursing Diagnoses: Impaired tissue integrity Goals: Ulcer/skin breakdown will heal within 14 weeks Date Initiated: 03/02/2018 Target Resolution Date: 05/14/2018 Goal Status: Active Interventions: Assess patient/caregiver ability to obtain necessary supplies Assess patient/caregiver ability to perform ulcer/skin care regimen upon admission and as needed Assess ulceration(s) every visit Notes: Electronic Signature(s) Signed: 03/16/2018 5:00:45 PM By: Montey Hora Entered By: Montey Hora on 03/16/2018 12:00:29 Christine [name]  ([id number]) -------------------------------------------------------------------------------- Pain Assessment Details Patient Name: Christine [name]. Date of Service: 03/16/2018 11:15 AM Medical Record Number: [id number] Patient Account Number: [id number] Date of Birth/Sex: 12-25-1945 (72 y.o. F) Treating RN: Montey Hora Primary Care Judith Campillo: Glendon Axe Other Clinician: Referring Canaan Holzer: Glendon Axe Treating Kayn Haymore/Extender: STONE III, HOYT Weeks in Treatment: 2 Active Problems Location of Pain Severity and Description of Pain Patient Has Paino No Site Locations Pain Management and Medication Current Pain Management: Electronic Signature(s) Signed: 03/16/2018 3:15:29 PM By: Lorine Bears RCP, RRT, CHT Signed: 03/16/2018 5:00:45 PM By: Montey Hora Entered By: Lorine Bears on 03/16/2018 11:18:45 Christine [name] ([id number]) -------------------------------------------------------------------------------- Patient/Caregiver Education Details Patient Name: JAZIYA, OBARR. Date of Service: 03/16/2018 11:15 AM Medical Record Number: [id number] Patient Account Number: [id number] Date of Birth/Gender: [birth year] (72 y.o. F) Treating RN: Montey Hora Primary Care Physician: Glendon Axe Other Clinician: Referring Physician: Glendon Axe Treating Physician/Extender: Sharalyn Ink in Treatment: 2 Education Assessment Education Provided To: Patient Education Topics Provided Wound/Skin Impairment: Handouts: Other: wound care as ordered Methods: Demonstration, Explain/Verbal Responses: State content correctly Electronic Signature(s) Signed: 03/16/2018 5:00:45 PM By: Montey Hora Entered By: Montey Hora on 03/16/2018 12:40:51 Christine [name] ([id number]) -------------------------------------------------------------------------------- Wound Assessment  Details Patient Name: ANANYA, MCCLEESE. Date of Service: 03/16/2018 11:15  AM Medical Record Number: [id number] Patient Account Number: [id number] Date of Birth/Sex: 1945-07-27 (72 y.o. F) Treating RN: Cornell Barman Primary Care Fatina Sprankle: Glendon Axe Other Clinician: Referring Otto Caraway: Glendon Axe Treating Deandre Stansel/Extender: STONE III, HOYT Weeks in Treatment: 2 Wound Status Wound Number: 1 Primary Malignant Wound Etiology: Wound Location: Left Breast Wound Open Wounding Event: Gradually Appeared Status: Date Acquired: 06/16/2016 Comorbid Cataracts, Congestive Heart Failure, Coronary Weeks Of Treatment: 2 History: Artery Disease, Hypertension, Myocardial Clustered Wound: No Infarction, Peripheral Arterial Disease, Osteoarthritis, Received Chemotherapy Photos Photo Uploaded By: Gretta Cool, BSN, RN, CWS, Kim on 03/16/2018 16:32:46 Wound Measurements Length: (cm) 5 Width: (cm) 4 Depth: (cm) 0.1 Area: (cm) 15.708 Volume: (cm) 1.571 % Reduction in Area: 3.8% % Reduction in Volume: 3.9% Epithelialization: None Wound Description Full Thickness Without Exposed Support Classification: Structures Wound Margin: Flat and Intact Exudate Medium Amount: Exudate Type: Serosanguineous Exudate Color: red, brown Foul Odor After Cleansing: No Slough/Fibrino Yes Wound Bed Granulation Amount: None Present (0%) Exposed Structure Necrotic Amount: Large (67-100%) Fascia Exposed: No Necrotic Quality: Eschar, Adherent Slough Fat Layer (Subcutaneous Tissue) Exposed: Yes Tendon Exposed: No Muscle Exposed: No Joint Exposed: No MARDENE, LESSIG. ([id number]) Bone Exposed: No Periwound Skin Texture Texture Color No Abnormalities Noted: No No Abnormalities Noted: No Callus: No Atrophie Blanche: No Crepitus: No Cyanosis: No Excoriation: No Ecchymosis: No Induration: No Erythema: No Rash: No Hemosiderin Staining: No Scarring: Yes Mottled: No Pallor: No Moisture Rubor: No No Abnormalities Noted: No Dry / Scaly: No Temperature / Pain Maceration:  No Tenderness on Palpation: Yes Wound Preparation Ulcer Cleansing: Rinsed/Irrigated with Saline Topical Anesthetic Applied: None Assessment Notes Fungating tumor Treatment Notes Wound #1 (Left Breast) 1. Cleansed with: Clean wound with Normal Saline 4. Dressing Applied: Calcium Alginate with Silver 5. Secondary [place] Signature(s) Signed: 03/16/2018 4:33:57 PM By: Gretta Cool, BSN, RN, CWS, Kim RN, BSN Entered By: Gretta Cool, BSN, RN, CWS, Kim on 03/16/2018 11:24:59 Christine [name] ([id number]) -------------------------------------------------------------------------------- [place] Details Patient Name: PAYTIN, RAMAKRISHNAN. Date of Service: 03/16/2018 11:15 AM Medical Record Number: [id number] Patient Account Number: [id number] Date of Birth/Sex: May 15, 1946 (72 y.o. F) Treating RN: Montey Hora Primary Care Erika Slaby: Glendon Axe Other Clinician: Referring Kippy Gohman: Glendon Axe Treating Sahas Sluka/Extender: STONE III, HOYT Weeks in Treatment: 2 Vital Signs Time Taken: 11:20 Temperature (F): 98.0 Height (in): 62 Pulse (bpm): 58 Weight (lbs): 88.1 Respiratory Rate (breaths/min): 16 Body Mass Index (BMI): 16.1 Blood Pressure (mmHg): 121/71 Reference Range: 80 - 120 mg / dl Electronic Signature(s) Signed: 03/16/2018 3:15:29 PM By: Lorine Bears RCP, RRT, CHT Entered By: Lorine Bears on 03/16/2018 11:22:11

## 2018-03-20 NOTE — Progress Notes (Signed)
Christine [name], Christine [name] ([id number]) Visit Report for 03/16/2018 Chief Complaint Document Details Patient Name: Christine [name], Christine [name]. Date of Service: 03/16/2018 11:15 AM Medical Record Number: [id number] Patient Account Number: [id number] Date of Birth/Sex: 23-Jan-1946 (72 y.o. F) Treating RN: Montey Hora Primary Care Provider: Glendon Axe Other Clinician: Referring Provider: Glendon Axe Treating Provider/Extender: Melburn Hake, HOYT Weeks in Treatment: 2 Information Obtained from: Patient Chief Complaint Left breast ulcer Electronic Signature(s) Signed: 03/17/2018 8:03:16 PM By: Worthy Keeler PA-C Entered By: Worthy Keeler on 03/16/2018 11:23:42 Christine [name] ([id number]) -------------------------------------------------------------------------------- HPI Details Patient Name: Christine [name]. Date of Service: 03/16/2018 11:15 AM Medical Record Number: [id number] Patient Account Number: [id number] Date of Birth/Sex: [birth year] (72 y.o. F) Treating RN: Montey Hora Primary Care Provider: Glendon Axe Other Clinician: Referring Provider: Glendon Axe Treating Provider/Extender: STONE III, HOYT Weeks in Treatment: 2 History of Present Illness Associated Signs and Symptoms: Patient has a history of hypertension, breast cancer, she is a current tobacco user, and she does have depression. HPI Description: 03/02/18 on evaluation today patient is seen for initial evaluation here in our clinic concerning issues that she has been having with an ulcer over the left breast area involving the nipple which is a result of Carcinoma. With that being said the patient appears to have received intermittent treatment due to discontinuing follow-up at times. This was initially back in 2013 and then subsequently on July 08, 2016 she had a pet scan suggesting the possibility of metastatic lesions in her long. They were too small to characterize however. She received one dose of chemotherapy July 30, 2016 again and then subsequently did not do follow-up for any additional follow-up in the interim. She is has a 10 according to records to undergo surgery or chemotherapy. With that being said she has had the wound on her breast foot seems to be quite a while possibly even as much as a year and a half having open January 2018. 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 She was referred to [place] by Dr. Grayland Ormond regarding the ulcer itself. Her current treatment plan was discussed to not necessarily be curative although it was the only thing she really wanted to pursue at this point. Nonetheless she does state that she understands all of this mentioned above. Nonetheless she's currently been using triple antibiotic ointment along with a Band-Aid over the wound area. She does have discomfort in fact she really didn't want anybody touching the area today due to health painful that was. Fortunately there's no evidence of overt infection at this point. No fevers chills noted 03/16/18 on evaluation today patient actually appears to be doing fairly well in regard to her left breast wound. She has been tolerating the dressing changes specifically the silver cell without complication. She states she's having much less pain and overall feels much better with this then she has with anything previous. The wound not staying so wet has seem to have been great benefit for her. In general the patient states that she's having no other major problems and complications at this time which is good news. No fevers, chills, nausea, or vomiting noted at this time. Electronic Signature(s) Signed: 03/17/2018 8:03:16 PM By: Worthy Keeler PA-C Entered By: Worthy Keeler on 03/16/2018 13:42:47 Christine [name] ([id number]) -------------------------------------------------------------------------------- Physical Exam Details Patient Name: Christine [name], Christine [name]. Date of Service: 03/16/2018 11:15 AM Medical Record Number: [id number] Patient Account Number:  [id number] Date of Birth/Sex: 27-Jan-1946 (72 y.o. F) Treating RN: Montey Hora Primary Care Provider: Glendon Axe Other Clinician: Referring Provider: Glendon Axe Treating Provider/Extender: STONE III, HOYT Weeks in Treatment: 2 Constitutional Well-nourished and well-hydrated in no acute distress. Respiratory normal breathing without difficulty. clear to auscultation bilaterally. Cardiovascular regular rate and rhythm with normal S1, S2. Psychiatric this patient is able to make decisions and demonstrates good insight into disease process. Alert and Oriented x 3. pleasant and cooperative. Notes Currently the patient seems to be doing very well in regard to her wound of the left breast from the standpoint of pain as well is there being no significant odor at this time both of which have helped her a lot as far as general and overall appearance and feeling of the wound is concerned. She seems very happy with were things stand. She does see Dr. Grayland Ormond shortly who is her oncologist to discuss her treatment options and were things stand. Electronic Signature(s) Signed: 03/17/2018 8:03:16 PM By: Worthy Keeler PA-C Entered By: Worthy Keeler on 03/16/2018 13:44:26 Christine [name] ([id number]) -------------------------------------------------------------------------------- Physician Orders Details Patient Name: Christine [name], Christine [name]. Date of Service: 03/16/2018 11:15 AM Medical Record Number: [id number] Patient Account Number: [id number] Date of Birth/Sex: Jan 23, 1946 (72 y.o. F) Treating RN: Montey Hora Primary Care Provider: Glendon Axe Other Clinician: Referring Provider: Glendon Axe Treating Provider/Extender: STONE III, HOYT Weeks in Treatment: 2 Verbal / Phone Orders: No Diagnosis Coding ICD-10 Coding Code Description C50.912 Malignant neoplasm of unspecified site of left  female breast L98.492 Non-pressure chronic ulcer of skin of other sites with fat layer exposed Z72.0 Tobacco use I10 Essential (primary) hypertension Wound Cleansing Wound #1 Left Breast o May Shower, gently pat wound dry prior to applying new dressing. Primary Wound Dressing Wound #1 Left Breast o Silver Alginate Secondary Dressing Wound #1 Left Breast o [place] - or bandaid Dressing Change Frequency Wound #1 Left Breast o Change dressing every day. Follow-up Appointments Wound #1 Left Breast o Return Appointment in 3 weeks. Electronic Signature(s) Signed: 03/16/2018 5:00:45 PM By: Montey Hora Signed: 03/17/2018 8:03:16 PM By: Worthy Keeler PA-C Entered By: Montey Hora on 03/16/2018 12:03:46 Christine [name] ([id number]) -------------------------------------------------------------------------------- Problem List Details Patient Name: Christine [name], Christine [name]. Date of Service: 03/16/2018 11:15 AM Medical Record Number: [id number] Patient Account Number: [id number] Date of Birth/Sex: 1945-12-02 (72 y.o. F) Treating RN: Montey Hora Primary Care Provider: Glendon Axe Other Clinician: Referring Provider: Glendon Axe Treating Provider/Extender: Melburn Hake, HOYT Weeks in Treatment: 2 Active Problems ICD-10 Evaluated Encounter Code Description Active Date Today Diagnosis C50.912 Malignant neoplasm of unspecified site of left female breast 03/02/2018 No Yes L98.492 Non-pressure chronic ulcer of skin of other sites with fat layer 03/02/2018 No Yes exposed Z72.0 Tobacco use 03/02/2018 No Yes I10 Essential (primary) hypertension 03/02/2018 No Yes Inactive Problems Resolved Problems Electronic Signature(s) Signed: 03/17/2018 8:03:16 PM By: Worthy Keeler PA-C Entered By: Worthy Keeler on 03/16/2018 11:23:37 Christine [name] ([id number]) -------------------------------------------------------------------------------- Progress Note Details Patient Name: Christine [name]. Date of Service: 03/16/2018 11:15 AM Medical Record Number: [id number] Patient Account Number: [id number] Date of Birth/Sex: 1945-12-22 (72 y.o. F) Treating RN: Montey Hora Primary Care Provider: Glendon Axe Other Clinician: Referring Provider: Glendon Axe Treating Provider/Extender: STONE III, HOYT Weeks in Treatment: 2 Subjective Chief Complaint Information obtained from Patient Left breast ulcer History of Present Illness (HPI) The following HPI elements were documented for the patient's wound: Associated Signs and Symptoms: Patient has a history of  hypertension, breast cancer, she is a current tobacco user, and she does have depression. 03/02/18 on evaluation today patient is seen for initial evaluation here in our clinic concerning issues that she has been having with an ulcer over the left breast area involving the nipple which is a result of Carcinoma. With that being said the patient appears to have received intermittent treatment due to discontinuing follow-up at times. This was initially back in 2013 and then subsequently on July 08, 2016 she had a pet scan suggesting the possibility of metastatic lesions in her long. They were too small to characterize however. She received one dose of chemotherapy July 30, 2016 again and then subsequently did not do follow-up for any additional follow-up in the interim. She is has a 10 according to records to undergo surgery or chemotherapy. With that being said she has had the wound on her breast foot seems to be quite a while possibly even as much as a year and a half having open January 2018. 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 She was referred to [place] by Dr. Grayland Ormond regarding the ulcer itself. Her current treatment plan was discussed to not necessarily be curative although it was the only thing she really wanted to pursue at this point.  Nonetheless she does state that she understands all of this mentioned above. Nonetheless she's currently been using triple antibiotic ointment along with a Band-Aid over the wound area. She does have discomfort in fact she really didn't want anybody touching the area today due to health painful that was. Fortunately there's no evidence of overt infection at this point. No fevers chills noted 03/16/18 on evaluation today patient actually appears to be doing fairly well in regard to her left breast wound. She has been tolerating the dressing changes specifically the silver cell without complication. She states she's having much less pain and overall feels much better with this then she has with anything previous. The wound not staying so wet has seem to have been great benefit for her. In general the patient states that she's having no other major problems and complications at this time which is good news. No fevers, chills, nausea, or vomiting noted at this time. Patient History Information obtained from Patient. Family History Heart Disease - Father,Paternal Grandparents, Hypertension - Father,Child, Stroke - Mother, No family history of Cancer, Diabetes, Kidney Disease, Lung Disease, Seizures, Thyroid Problems, Tuberculosis. Social History Current every day smoker, Marital Status - Divorced, Alcohol Use - Never, Drug Use - No History, Caffeine Use - Daily. Medical And Surgical History Notes Constitutional Symptoms (General Health) Defibulator; Cancer Stage III; HTN; High Cholesterol, Oncologic Breast Cancer-Only 2 treatment of Chemo one in 2012; one in 2018 [place], Lailani F. ([id number]) Review of Systems (ROS) Constitutional Symptoms ([place]) Denies complaints or symptoms of Fever, Chills. Respiratory The patient has no complaints or symptoms. Cardiovascular The patient has no complaints or symptoms. Psychiatric The patient has no complaints or  symptoms. Objective Constitutional Well-nourished and well-hydrated in no acute distress. Vitals Time Taken: 11:20 AM, Height: 62 in, Weight: 88.1 lbs, BMI: 16.1, Temperature: 98.0 F, Pulse: 58 bpm, Respiratory Rate: 16 breaths/min, Blood Pressure: 121/71 mmHg. Respiratory normal breathing without difficulty. clear to auscultation bilaterally. Cardiovascular regular rate and rhythm with normal S1, S2. Psychiatric this patient is able to make decisions and demonstrates good insight into disease process. Alert and Oriented x 3. pleasant and cooperative. General Notes:  Currently the patient seems to be doing very well in regard to her wound of the left breast from the standpoint of pain as well is there being no significant odor at this time both of which have helped her a lot as far as general and overall appearance and feeling of the wound is concerned. She seems very happy with were things stand. She does see Dr. Grayland Ormond shortly who is her oncologist to discuss her treatment options and were things stand. Integumentary (Hair, Skin) Wound #1 status is Open. Original cause of wound was Gradually Appeared. The wound is located on the Left Breast. The wound measures 5cm length x 4cm width x 0.1cm depth; 15.708cm^2 area and 1.571cm^3 volume. There is Fat Layer (Subcutaneous Tissue) Exposed exposed. There is a medium amount of serosanguineous drainage noted. The wound margin is flat and intact. There is no granulation within the wound bed. There is a large (67-100%) amount of necrotic tissue within the wound bed including Eschar and Adherent Slough. The periwound skin appearance exhibited: Scarring. The periwound skin appearance did not exhibit: Callus, Crepitus, Excoriation, Induration, Rash, Dry/Scaly, Maceration, Atrophie Blanche, Cyanosis, Ecchymosis, Hemosiderin Staining, Mottled, Pallor, Rubor, Erythema. The periwound has tenderness on palpation. General Notes: Fungating tumor Christine [name], Christine [name] ([id number]) Assessment Active Problems ICD-10 Malignant neoplasm of unspecified site of left female breast Non-pressure chronic ulcer of skin of other sites with fat layer exposed Tobacco use Essential (primary) hypertension Plan Wound Cleansing: Wound #1 Left Breast: May Shower, gently pat wound dry prior to applying new dressing. Primary Wound Dressing: Wound #1 Left Breast: Silver Alginate Secondary Dressing: Wound #1 Left Breast: [place] - or bandaid Dressing Change Frequency: Wound #1 Left Breast: Change dressing every day. Follow-up Appointments: Wound #1 Left Breast: Return Appointment in 3 weeks. I'm gonna suggest currently that we see the patient back for a fault visit to see were things stand on a semi regular basis in order to ensure that nothing worsens or changes in the interim. She's in agreement the plan. Basically were helping to monitor and control the wound itself. We'll see what Dr. Gary Fleet plan is as far as treatment is concerned. Please see above for specific wound care orders. We will see patient for re-evaluation in 3 week(s) here in the clinic. If anything worsens or changes patient will contact our office for additional recommendations. Electronic Signature(s) Signed: 03/17/2018 8:03:16 PM By: Worthy Keeler PA-C Entered By: Worthy Keeler on 03/16/2018 13:46:25 Christine [name] ([id number]) -------------------------------------------------------------------------------- ROS/PFSH Details Patient Name: Christine [name], Christine [name]. Date of Service: 03/16/2018 11:15 AM Medical Record Number: [id number] Patient Account Number: [id number] Date of Birth/Sex: 1945-12-13 (72 y.o. F) Treating RN: Montey Hora Primary Care Provider: Glendon Axe Other Clinician: Referring Provider: Glendon Axe Treating Provider/Extender: STONE III, HOYT Weeks in Treatment: 2 Information Obtained From Patient Wound History Do you currently have one or more open  woundso Yes How many open wounds do you currently haveo 1 Approximately how long have you had your woundso 1.8 months How have you been treating your wound(s) until nowo neosporin Has your wound(s) ever healed and then re-openedo No Have you had any lab work done in the past montho No Have you tested positive for an antibiotic resistant organism (MRSA, VRE)o No Constitutional Symptoms (General Health) Complaints and Symptoms: Negative for: Fever; Chills Medical History: Past Medical History Notes: Defibulator; Cancer Stage III; HTN; High Cholesterol, Eyes Medical History: Positive for: Cataracts Negative for: Glaucoma; Optic Neuritis Ear/Nose/Mouth/Throat Medical History: Negative for:  Chronic sinus problems/congestion; Middle ear problems Hematologic/Lymphatic Medical History: Negative for: Anemia; Hemophilia; Human Immunodeficiency Virus; Lymphedema; Sickle Cell Disease Respiratory Complaints and Symptoms: No Complaints or Symptoms Medical History: Negative for: Aspiration; Asthma; Chronic Obstructive Pulmonary Disease (COPD); Pneumothorax; Sleep Apnea; Tuberculosis Cardiovascular Complaints and Symptoms: No Complaints or Symptoms Christine [name], Christine [name] ([id number]) Medical History: Positive for: Congestive Heart Failure; Coronary Artery Disease; Hypertension; Myocardial Infarction; Peripheral Arterial Disease Negative for: Angina; Arrhythmia; Deep Vein Thrombosis; Hypotension; Peripheral Venous Disease; Phlebitis; Vasculitis Gastrointestinal Medical History: Negative for: Cirrhosis ; Colitis; Crohnos; Hepatitis A; Hepatitis B Endocrine Medical History: Negative for: Type I Diabetes; Type II Diabetes Genitourinary Medical History: Negative for: End Stage Renal Disease Immunological Medical History: Negative for: Lupus Erythematosus; Raynaudos; Scleroderma Integumentary (Skin) Medical History: Negative for: History of Burn; History of pressure  wounds Musculoskeletal Medical History: Positive for: Osteoarthritis Negative for: Gout; Rheumatoid Arthritis; Osteomyelitis Neurologic Medical History: Negative for: Dementia; Neuropathy; Quadriplegia; Paraplegia; Seizure Disorder Oncologic Medical History: Positive for: Received Chemotherapy Past Medical History Notes: Breast Cancer-Only 2 treatment of Chemo one in 2012; one in 2018 Psychiatric Complaints and Symptoms: No Complaints or Symptoms Medical History: Negative for: Anorexia/bulimia; Confinement Anxiety HBO Extended History Items Eyes: Cataracts Christine [name], Christine [name] ([id number]) Immunizations Pneumococcal Vaccine: Received Pneumococcal Vaccination: Yes Implantable Devices Family and Social History Cancer: No; Diabetes: No; Heart Disease: Yes - Father,Paternal Grandparents; Hypertension: Yes - Father,Child; Kidney Disease: No; Lung Disease: No; Seizures: No; Stroke: Yes - Mother; Thyroid Problems: No; Tuberculosis: No; Current every day smoker; Marital Status - Divorced; Alcohol Use: Never; Drug Use: No History; Caffeine Use: Daily; Advanced Directives: No; Patient does not want information on Advanced Directives; Living Will: No; Medical Power of Attorney: No Physician Affirmation I have reviewed and agree with the above information. Electronic Signature(s) Signed: 03/16/2018 5:00:45 PM By: Montey Hora Signed: 03/17/2018 8:03:16 PM By: Worthy Keeler PA-C Entered By: Worthy Keeler on 03/16/2018 13:43:03 Christine [name] ([id number]) -------------------------------------------------------------------------------- SuperBill Details Patient Name: Christine [name], Christine [name]. Date of Service: 03/16/2018 Medical Record Number: [id number] Patient Account Number: [id number] Date of Birth/Sex: [birth year] (72 y.o. F) Treating RN: Montey Hora Primary Care Provider: Glendon Axe Other Clinician: Referring Provider: Glendon Axe Treating Provider/Extender: STONE III, HOYT Weeks in  Treatment: 2 Diagnosis Coding ICD-10 Codes Code Description C50.912 Malignant neoplasm of unspecified site of left female breast L98.492 Non-pressure chronic ulcer of skin of other sites with fat layer exposed Z72.0 Tobacco use I10 Essential (primary) hypertension Facility Procedures CPT4 Code: [id number] Description: [id number] - WOUND CARE VISIT-LEV 2 EST PT Modifier: Quantity: 1 Physician Procedures CPT4 Code: [id number] Description: 19622 - WC PHYS LEVEL 3 - EST PT ICD-10 Diagnosis Description C50.912 Malignant neoplasm of unspecified site of left female breast L98.492 Non-pressure chronic ulcer of skin of other sites with fat l Z72.0 Tobacco use I10 Essential (primary)  hypertension Modifier: ayer exposed Quantity: 1 Electronic Signature(s) Signed: 03/17/2018 8:03:16 PM By: Worthy Keeler PA-C Entered By: Worthy Keeler on 03/16/2018 13:45:22

## 2018-04-06 ENCOUNTER — Ambulatory Visit: Payer: Medicare Other | Admitting: Physician Assistant

## 2018-04-09 NOTE — Progress Notes (Signed)
HAWLEY, PAVIA ([id number]) Visit Report for 04/06/2018 Chief Complaint Document Details Patient Name: Christine [name], Christine [name]. Date of Service: 04/06/2018 11:15 AM Medical Record Number: [id number] Patient Account Number: [id number] Date of Birth/Sex: May 25, 1946 (72 y.o. F) Treating RN: Montey Hora Primary Care Provider: Glendon Axe Other Clinician: Referring Provider: Glendon Axe Treating Provider/Extender: Melburn Hake, HOYT Weeks in Treatment: 5 Information Obtained from: Patient Chief Complaint Left breast ulcer Electronic Signature(s) Signed: 04/08/2018 1:45:00 AM By: Worthy Keeler PA-C Entered By: Worthy Keeler on 04/06/2018 12:39:41 [name] ([id number]) -------------------------------------------------------------------------------- Problem List Details Patient Name: [name], [name]. Date of Service: 04/06/2018 11:15 AM Medical Record Number: [id number] Patient Account Number: [id number] Date of Birth/Sex: [birth year] (71 y.o. F) Treating RN: Montey Hora Primary Care Provider: Glendon Axe Other Clinician: Referring Provider: Glendon Axe Treating Provider/Extender: Melburn Hake, HOYT Weeks in Treatment: 5 Active Problems ICD-10 Evaluated Encounter Code Description Active Date Today Diagnosis C50.912 Malignant neoplasm of unspecified site of left female breast 03/02/2018 No Yes L98.492 Non-pressure chronic ulcer of skin of other sites with fat layer 03/02/2018 No Yes exposed Z72.0 Tobacco use 03/02/2018 No Yes I10 Essential (primary) hypertension 03/02/2018 No Yes Inactive Problems Resolved Problems Electronic Signature(s) Signed: 04/08/2018 1:45:00 AM By: Worthy Keeler PA-C Entered By: Worthy Keeler on 04/06/2018 12:39:37

## 2018-04-12 ENCOUNTER — Encounter: Payer: Medicare Other | Admitting: Physician Assistant

## 2018-04-12 DIAGNOSIS — L98492 Non-pressure chronic ulcer of skin of other sites with fat layer exposed: Secondary | ICD-10-CM | POA: Diagnosis not present

## 2018-04-12 NOTE — Progress Notes (Signed)
[place]  [phone] [phone]  ID: Christine [name] OB: 1945-07-30  MR#: [id number]  ZWC#:[id number]  Patient Care Team: Glendon Axe, MD as PCP - General (Internal Medicine)  CHIEF COMPLAINT: Clinical stage IIIB ER/PR positive, HER-2 negative invasive carcinoma of the upper inner quadrant of the left breast.  INTERVAL HISTORY: Patient returns to clinic today for further evaluation and assess her toleration of tamoxifen.  She is tolerating her treatments well without significant side effects.  She has noticed an improvement of her wound on her left breast.  She currently feels well and is asymptomatic. She has no neurologic complaints. She denies any recent fevers or illnesses. She has a good appetite and denies weight loss.  She denies any chest pain, hemoptysis, cough, or shortness of breath.  She has no nausea, vomiting, constipation, or diarrhea. She has no urinary complaints.  Patient offers no further specific complaints today.  REVIEW OF SYSTEMS:   Review of Systems  Constitutional: Negative.  Negative for fever, malaise/fatigue and weight loss.  Respiratory: Negative.  Negative for cough and shortness of breath.   Cardiovascular: Negative.  Negative for chest pain and leg swelling.  Gastrointestinal: Negative.  Negative for abdominal pain.  Genitourinary: Negative.  Negative for dysuria.  Musculoskeletal: Negative.  Negative for back pain.  Skin: Negative.  Negative for rash.  Neurological: Negative.  Negative for dizziness, focal weakness, weakness and headaches.  Psychiatric/Behavioral: The patient is nervous/anxious.     As per HPI. Otherwise, a complete review of systems is negative.  PAST MEDICAL HISTORY: Past Medical History:  Diagnosis Date  . Allergic rhinitis   . Anxiety   . Breast cancer ([place])   . Chronic systolic heart failure ([place])   . Coronary artery disease   . GERD (gastroesophageal reflux disease)   . Heart  murmur   . Hyperlipidemia   . Hypertension   . Implantable defibrillator [employer]    DOI 2006  . MI (myocardial infarction) ([place])   . Osteoarthritis     PAST SURGICAL HISTORY: Past Surgical History:  Procedure Laterality Date  . CHF exacerbation  10/07  . CORONARY STENT PLACEMENT  8/06   3 stents; myocardial infarction  . NM MYOVIEW LTD  1/08   EF 39%, no sx ischemia  . PACEMAKER PLACEMENT  2/07   Defibrillator    FAMILY HISTORY: Family History  Problem Relation Age of Onset  . Stroke Mother 63  . Heart attack Father   . Coronary artery disease Father   . Heart attack Brother   . Coronary artery disease Other   . Hypertension Other   . Cancer Neg Hx        No breast or colon cancer    ADVANCED DIRECTIVES (Y/N):  N  HEALTH MAINTENANCE: Social History   Tobacco Use  . Smoking status: Current Every Day Smoker    Packs/day: 0.50  . Smokeless tobacco: Never Used  . Tobacco comment: also uses E-cig  Substance Use Topics  . Alcohol use: No    Comment: 2-3 times per month  . Drug use: No     Colonoscopy:  PAP:  Bone density:  Lipid panel:  Allergies  Allergen Reactions  . Atorvastatin     REACTION: increased LFT's  . Lisinopril     REACTION: cough  . Oxycodone Hcl     REACTION: unspecified  . Simvastatin     REACTION: myalgias    Current Outpatient Medications  Medication Sig Dispense Refill  .  albuterol (VENTOLIN HFA) 108 (90 Base) MCG/ACT inhaler INHALE 2 PUFFS BY MOUTH 4 TIMES A DAY ASNEEDED    . aspirin 81 MG tablet Take 81 mg by mouth daily.    . bisacodyl (DULCOLAX) 5 MG EC tablet Take 5 mg by mouth daily as needed for mild constipation or moderate constipation.    . carvedilol (COREG) 25 MG tablet TAKE 1 TABLET BY MOUTH TWICE A DAY 60 tablet 2  . cetirizine (ZYRTEC) 10 MG tablet TAKE 1 TABLET BY MOUTH ONCE DAILY    . Cholecalciferol (VITAMIN D) 2000 UNITS tablet Take 2,000 Units by mouth daily.    . furosemide (LASIX) 40 MG tablet Take 40 mg by  mouth as needed.    Marland Kitchen HYDROcodone-acetaminophen (NORCO/VICODIN) 5-325 MG tablet Take 1 tablet by mouth every 6 (six) hours as needed for moderate pain. 30 tablet 0  . lidocaine-prilocaine (EMLA) cream Apply to affected area once 30 g 3  . loratadine (CLARITIN) 10 MG tablet Take by mouth.    Marland Kitchen LORazepam (ATIVAN) 0.5 MG tablet Take 0.5 mg by mouth 2 (two) times daily.     . Multiple Vitamin (MULTIVITAMIN) tablet Take 1 tablet by mouth daily.    . Omega-3 Fatty Acids (OMEGA 3 PO) Take by mouth daily.    Marland Kitchen omeprazole (PRILOSEC) 20 MG capsule Take 20 mg by mouth 2 (two) times daily.      . pantoprazole (PROTONIX) 40 MG tablet TAKE 1 TABLET BY MOUTH ONCE DAILY    . Polyethylene Glycol 3350 (PEG 3350) POWD Take by mouth.    . rosuvastatin (CRESTOR) 10 MG tablet Take 10 mg by mouth at bedtime.      . tamoxifen (NOLVADEX) 20 MG tablet Take 1 tablet (20 mg total) by mouth daily. 30 tablet 3  . traMADol (ULTRAM) 50 MG tablet Take 50 mg by mouth every 6 (six) hours as needed for moderate pain.     . traMADol (ULTRAM) 50 MG tablet TAKE 1 TABLET BY MOUTH EVERY 8 HOURS AS NEEDED PAIN    . valsartan (DIOVAN) 320 MG tablet Take 320 mg by mouth daily.    Marland Kitchen dicyclomine (BENTYL) 20 MG tablet Take by mouth.     No current facility-administered medications for this visit.     OBJECTIVE: Vitals:   04/15/18 1058  BP: (!) 142/77  Pulse: 64  Resp: 18  Temp: (!) 97.3 F (36.3 C)     Body mass index is 16.39 kg/m.    ECOG FS:0 - Asymptomatic  General: Well-developed, well-nourished, no acute distress. Eyes: Pink conjunctiva, anicteric sclera. HEENT: Normocephalic, moist mucous membranes. Breast: Left breast mass and axillary lymphadenopathy reduced in size.  Skin ulceration healing.  Lungs: Clear to auscultation bilaterally. Heart: Regular rate and rhythm. No rubs, murmurs, or gallops. Abdomen: Soft, nontender, nondistended. No organomegaly noted, normoactive bowel sounds. Musculoskeletal: No edema,  cyanosis, or clubbing. Neuro: Alert, answering all questions appropriately. Cranial nerves grossly intact. Skin: No rashes or petechiae noted. Psych: Normal affect.  LAB RESULTS:  Lab Results  Component Value Date   NA 139 03/09/2018   K 5.1 03/09/2018   CL 103 03/09/2018   CO2 27 03/09/2018   GLUCOSE 89 03/09/2018   BUN 10 03/09/2018   CREATININE 0.87 03/09/2018   CALCIUM 9.6 03/09/2018   PROT 6.9 03/09/2018   ALBUMIN 4.2 03/09/2018   AST 23 03/09/2018   ALT 11 03/09/2018   ALKPHOS 82 03/09/2018   BILITOT 0.7 03/09/2018   GFRNONAA >60 03/09/2018  GFRAA >60 03/09/2018    Lab Results  Component Value Date   WBC 5.9 03/09/2018   NEUTROABS 3.4 03/09/2018   HGB 12.8 03/09/2018   HCT 38.0 03/09/2018   MCV 91.5 03/09/2018   PLT 127 (L) 03/09/2018     STUDIES: No results found.  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  PLAN:    1. 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 She received one dose of chemotherapy using Taxotere and Cytoxan in 2013. She did not receive any XRT or hormonal treatment. PET scan results from July 08, 2016 reviewed independently possibly suggesting metastatic lesions in her lung, but these are too small to characterize.  This will need to be repeated in the near future.  Patient received 1 dose of Taxotere and Cytoxan again on July 30, 2016, but then did not return for any further follow-up.  Patient admits she is hesitant to undergo surgery or chemotherapy, and given her history of compliance is unclear if XRT would offer any benefit.  Patient could not tolerate letrozole therefore this was discontinued.  She is tolerating tamoxifen well.  Return to clinic in 1 month for further evaluation.    2.  Breast wound: Improving.   Appreciate wound care clinic input.  I spent a total of 30 minutes face-to-face with the patient of which greater than 50% of the visit was spent in counseling and coordination of care as detailed above.   Patient expressed understanding and was in agreement with this plan. She also understands that She can call clinic at any time with any questions, concerns, or complaints.   Cancer Staging Breast cancer of upper-inner quadrant of right female breast [facility]) Staging form: Breast, AJCC 8th Edition - Clinical: Stage IIIB (cT4b, cN1, cM0, G2, ER: Positive, PR: Positive, HER2: Negative) - Signed by Lloyd Huger, MD on 07/01/2016   Lloyd Huger, MD   04/17/2018 8:15 AM

## 2018-04-15 ENCOUNTER — Encounter: Payer: Self-pay | Admitting: Oncology

## 2018-04-15 ENCOUNTER — Inpatient Hospital Stay: Payer: Medicare Other | Attending: Oncology | Admitting: Oncology

## 2018-04-15 VITALS — BP 142/77 | HR 64 | Temp 97.3°F | Resp 18 | Wt 89.6 lb

## 2018-04-15 DIAGNOSIS — Z170 Estrogen receptor positive status [ER+]: Secondary | ICD-10-CM | POA: Insufficient documentation

## 2018-04-15 DIAGNOSIS — Z720 Tobacco use: Secondary | ICD-10-CM | POA: Insufficient documentation

## 2018-04-15 DIAGNOSIS — C50211 Malignant neoplasm of upper-inner quadrant of right female breast: Secondary | ICD-10-CM | POA: Insufficient documentation

## 2018-04-15 NOTE — Progress Notes (Signed)
Christine, [name] ([id number]) Visit Report for 04/12/2018 Arrival Information Details Patient Name: Christine [name], Christine [name]. Date of Service: 04/12/2018 2:15 PM Medical Record Number: [id number] Patient Account Number: [id number] Date of Birth/Sex: 10-31-1945 (72 y.o. F) Treating RN: Secundino Ginger Primary Care Remedios Mckone: Glendon Axe Other Clinician: Referring Horrace Hanak: Glendon Axe Treating Jachai Okazaki/Extender: STONE III, HOYT Weeks in Treatment: 5 Visit Information History Since Last Visit Added or deleted any medications: No Patient Arrived: Ambulatory Any new allergies or adverse reactions: No Arrival Time: 14:48 Had a fall or experienced change in No Accompanied By: self activities of daily living that may affect Transfer Assistance: None risk of falls: Patient Identification Verified: Yes Signs or symptoms of abuse/neglect since last visito No Secondary Verification Process Completed: Yes Hospitalized since last visit: No Implantable device outside of the clinic excluding No cellular tissue based products placed in the center since last visit: Has Dressing in Place as Prescribed: Yes Pain Present Now: No Electronic Signature(s) Signed: 04/12/2018 3:39:15 PM By: Secundino Ginger Entered By: Secundino Ginger on 04/12/2018 14:48:30 Christine [name] ([id number]) -------------------------------------------------------------------------------- Clinic Level of Care Assessment Details Patient Name: Christine [name]. Date of Service: 04/12/2018 2:15 PM Medical Record Number: [id number] Patient Account Number: [id number] Date of Birth/Sex: 03/08/1946 (72 y.o. F) Treating RN: Cornell Barman Primary Care Shell Yandow: Glendon Axe Other Clinician: Referring Antwanette Wesche: Glendon Axe Treating Fletcher Rathbun/Extender: STONE III, HOYT Weeks in Treatment: 5 Clinic Level of Care Assessment Items TOOL 4 Quantity Score '[]'$  - Use when only an EandM is performed on FOLLOW-UP visit 0 ASSESSMENTS - Nursing Assessment /  Reassessment '[]'$  - Reassessment of Co-morbidities (includes updates in patient status) 0 X- 1 5 Reassessment of Adherence to Treatment Plan ASSESSMENTS - Wound and Skin Assessment / Reassessment X - Simple Wound Assessment / Reassessment - one wound 1 5 '[]'$  - 0 Complex Wound Assessment / Reassessment - multiple wounds '[]'$  - 0 Dermatologic / Skin Assessment (not related to wound area) ASSESSMENTS - Focused Assessment '[]'$  - Circumferential Edema Measurements - multi extremities 0 '[]'$  - 0 Nutritional Assessment / Counseling / Intervention '[]'$  - 0 Lower Extremity Assessment (monofilament, tuning fork, pulses) '[]'$  - 0 Peripheral Arterial Disease Assessment (using hand held doppler) ASSESSMENTS - Ostomy and/or Continence Assessment and Care '[]'$  - Incontinence Assessment and Management 0 '[]'$  - 0 Ostomy Care Assessment and Management (repouching, etc.) PROCESS - Coordination of Care X - Simple Patient / Family Education for ongoing care 1 15 '[]'$  - 0 Complex (extensive) Patient / Family Education for ongoing care X- 1 10 Staff obtains Programmer, systems, Records, Test Results / Process Orders '[]'$  - 0 Staff telephones HHA, Nursing Homes / Clarify orders / etc '[]'$  - 0 Routine Transfer to another Facility (non-emergent condition) '[]'$  - 0 Routine Hospital Admission (non-emergent condition) '[]'$  - 0 New Admissions / Biomedical engineer / Ordering NPWT, Apligraf, etc. '[]'$  - 0 Emergency Hospital Admission (emergent condition) X- 1 10 Simple Discharge Coordination Christine, [name] ([id number]) '[]'$  - 0 Complex (extensive) Discharge Coordination PROCESS - Special Needs '[]'$  - Pediatric / Minor Patient Management 0 '[]'$  - 0 Isolation Patient Management '[]'$  - 0 Hearing / Language / Visual special needs '[]'$  - 0 Assessment of Community assistance (transportation, D/C planning, etc.) '[]'$  - 0 Additional assistance / Altered mentation '[]'$  - 0 Support Surface(s) Assessment (bed, cushion, seat, etc.) INTERVENTIONS -  Wound Cleansing / Measurement X - Simple Wound Cleansing - one wound 1 5 '[]'$  - 0 Complex Wound Cleansing - multiple wounds X- 1 5 Wound Imaging (photographs -  any number of wounds) '[]'$  - 0 Wound Tracing (instead of photographs) X- 1 5 Simple Wound Measurement - one wound '[]'$  - 0 Complex Wound Measurement - multiple wounds INTERVENTIONS - Wound Dressings '[]'$  - Small Wound Dressing one or multiple wounds 0 X- 1 15 Medium Wound Dressing one or multiple wounds '[]'$  - 0 Large Wound Dressing one or multiple wounds '[]'$  - 0 Application of Medications - topical '[]'$  - 0 Application of Medications - injection INTERVENTIONS - Miscellaneous '[]'$  - External ear exam 0 '[]'$  - 0 Specimen Collection (cultures, biopsies, blood, body fluids, etc.) '[]'$  - 0 Specimen(s) / Culture(s) sent or taken to Lab for analysis '[]'$  - 0 Patient Transfer (multiple staff / Civil Service fast streamer / Similar devices) '[]'$  - 0 Simple Staple / Suture removal (25 or less) '[]'$  - 0 Complex Staple / Suture removal (26 or more) '[]'$  - 0 Hypo / Hyperglycemic Management (close monitor of Blood Glucose) '[]'$  - 0 Ankle / Brachial Index (ABI) - do not check if billed separately X- 1 5 Vital Signs Christine [name], Christine [name] ([id number]) Has the patient been seen at the hospital within the last three years: Yes Total Score: 80 Level Of Care: New/Established - Level 3 Electronic Signature(s) Signed: 04/12/2018 3:24:13 PM By: Gretta Cool, BSN, RN, CWS, Kim RN, BSN Entered By: Gretta Cool, BSN, RN, CWS, Kim on 04/12/2018 15:09:00 Christine [name] ([id number]) -------------------------------------------------------------------------------- Encounter Discharge Information Details Patient Name: Christine [name], Christine [name]. Date of Service: 04/12/2018 2:15 PM Medical Record Number: [id number] Patient Account Number: [id number] Date of Birth/Sex: Jan 14, 1946 (72 y.o. F) Treating RN: Cornell Barman Primary Care Leslee Suire: Glendon Axe Other Clinician: Referring Lamonica Trueba: Glendon Axe Treating  Dontavius Keim/Extender: Melburn Hake, HOYT Weeks in Treatment: 5 Encounter Discharge Information Items Discharge Condition: Stable Ambulatory Status: Ambulatory Discharge Destination: Home Transportation: Private Auto Accompanied By: self Schedule Follow-up Appointment: Yes Clinical Summary of Care: Patient Declined Electronic Signature(s) Signed: 04/12/2018 3:24:13 PM By: Gretta Cool, BSN, RN, CWS, Kim RN, BSN Entered By: Gretta Cool, BSN, RN, CWS, Kim on 04/12/2018 15:11:04 Christine [name] ([id number]) -------------------------------------------------------------------------------- Lower Extremity Assessment Details Patient Name: Christine [name], Christine [name]. Date of Service: 04/12/2018 2:15 PM Medical Record Number: [id number] Patient Account Number: [id number] Date of Birth/Sex: December 04, 1945 (72 y.o. F) Treating RN: Secundino Ginger Primary Care Khayman Kirsch: Glendon Axe Other Clinician: Referring Levonia Wolfley: Glendon Axe Treating Aalaysia Liggins/Extender: Melburn Hake, HOYT Weeks in Treatment: 5 Electronic Signature(s) Signed: 04/12/2018 3:39:15 PM By: Secundino Ginger Entered By: Secundino Ginger on 04/12/2018 14:51:30 Christine [name] ([id number]) -------------------------------------------------------------------------------- Multi Wound Chart Details Patient Name: Christine [name]. Date of Service: 04/12/2018 2:15 PM Medical Record Number: [id number] Patient Account Number: [id number] Date of Birth/Sex: July 19, 1945 (72 y.o. F) Treating RN: Cornell Barman Primary Care Lakenya Riendeau: Glendon Axe Other Clinician: Referring Jamyiah Labella: Glendon Axe Treating Emberlynn Riggan/Extender: STONE III, HOYT Weeks in Treatment: 5 Vital Signs Height(in): 62 Pulse(bpm): 55 Weight(lbs): 88.1 Blood Pressure(mmHg): 125/65 Body Mass Index(BMI): 16 Temperature(F): 97.9 Respiratory Rate 16 (breaths/min): Photos: [1:No Photos] [N/A:N/A] Wound Location: [1:Left Breast] [N/A:N/A] Wounding Event: [1:Gradually Appeared] [N/A:N/A] Primary Etiology: [1:Malignant Wound]  [N/A:N/A] Comorbid History: [1:Cataracts, Congestive Heart Failure, Coronary Artery Disease, Hypertension, Myocardial Infarction, Peripheral Arterial Disease, Osteoarthritis, Received Chemotherapy] [N/A:N/A] Date Acquired: [1:06/16/2016] [N/A:N/A] Weeks of Treatment: [1:5] [N/A:N/A] Wound Status: [1:Open] [N/A:N/A] Measurements L x W x D [1:1.5x1x0.1] [N/A:N/A] (cm) Area (cm) : [1:1.178] [N/A:N/A] Volume (cm) : [1:0.118] [N/A:N/A] % Reduction in Area: [1:92.80%] [N/A:N/A] % Reduction in Volume: [1:92.80%] [N/A:N/A] Classification: [1:Full Thickness Without Exposed Support Structures] [N/A:N/A] Exudate Amount: [1:Medium] [N/A:N/A] Exudate Type: [1:Serosanguineous] [N/A:N/A] Exudate  Color: [1:red, brown] [N/A:N/A] Wound Margin: [1:Flat and Intact] [N/A:N/A] Granulation Amount: [1:None Present (0%)] [N/A:N/A] Necrotic Amount: [1:Large (67-100%)] [N/A:N/A] Necrotic Tissue: [1:Eschar, Adherent Slough] [N/A:N/A] Exposed Structures: [1:Fat Layer (Subcutaneous Tissue) Exposed: Yes Fascia: No Tendon: No Muscle: No Joint: No Bone: No] [N/A:N/A] Epithelialization: [1:None] [N/A:N/A] Periwound Skin Texture: Scarring: Yes N/A N/A Excoriation: No Induration: No Callus: No Crepitus: No Rash: No Periwound Skin Moisture: Maceration: No N/A N/A Dry/Scaly: No Periwound Skin Color: Atrophie Blanche: No N/A N/A Cyanosis: No Ecchymosis: No Erythema: No Hemosiderin Staining: No Mottled: No Pallor: No Rubor: No Tenderness on Palpation: Yes N/A N/A Wound Preparation: Ulcer Cleansing: N/A N/A Rinsed/Irrigated with Saline Topical Anesthetic Applied: None Treatment Notes Electronic Signature(s) Signed: 04/12/2018 3:24:13 PM By: Gretta Cool, BSN, RN, CWS, Kim RN, BSN Entered By: Gretta Cool, BSN, RN, CWS, Kim on 04/12/2018 15:05:11 Christine [name] ([id number]) -------------------------------------------------------------------------------- [place] Details Patient Name: Christine [name], Christine [name]. Date of Service: 04/12/2018 2:15 PM Medical Record Number: [id number] Patient Account Number: [id number] Date of Birth/Sex: December 31, 1945 (72 y.o. F) Treating RN: Cornell Barman Primary Care Mckaela Howley: Glendon Axe Other Clinician: Referring Graceanne Guin: Glendon Axe Treating Nomar Broad/Extender: STONE III, HOYT Weeks in Treatment: 5 Active Inactive ` Abuse / Safety / Falls / Self Care Management Nursing Diagnoses: Potential for falls Goals: Patient will remain injury free related to falls Date Initiated: 03/02/2018 Target Resolution Date: 05/14/2018 Goal Status: Active Interventions: Assess fall risk on admission and as needed Notes: ` Orientation to the Wound Care Program Nursing Diagnoses: Knowledge deficit related to the wound healing center program Goals: Patient/caregiver will verbalize understanding of the [facility] Date Initiated: 03/02/2018 Target Resolution Date: 05/14/2018 Goal Status: Active Interventions: Provide education on orientation to the wound center Notes: ` Pain, Acute or Chronic Nursing Diagnoses: Pain, acute or chronic: actual or potential Goals: Patient/caregiver will verbalize comfort level met Date Initiated: 03/02/2018 Target Resolution Date: 05/14/2018 Goal Status: Active Interventions: Christine [name], Christine [name] ([id number]) Assess comfort goal upon admission Notes: ` Wound/Skin Impairment Nursing Diagnoses: Impaired tissue integrity Goals: Ulcer/skin breakdown will heal within 14 weeks Date Initiated: 03/02/2018 Target Resolution Date: 05/14/2018 Goal Status: Active Interventions: Assess patient/caregiver ability to obtain necessary supplies Assess patient/caregiver ability to perform ulcer/skin care regimen upon admission and as needed Assess ulceration(s) every visit Notes: Electronic Signature(s) Signed: 04/12/2018 3:24:13 PM By: Gretta Cool, BSN, RN, CWS, Kim RN, BSN Entered By: Gretta Cool, BSN, RN, CWS, Kim on 04/12/2018  15:04:51 Christine [name] ([id number]) -------------------------------------------------------------------------------- Pain Assessment Details Patient Name: Christine [name], Christine [name]. Date of Service: 04/12/2018 2:15 PM Medical Record Number: [id number] Patient Account Number: [id number] Date of Birth/Sex: 1945-10-03 (72 y.o. F) Treating RN: Secundino Ginger Primary Care Matilde Markie: Glendon Axe Other Clinician: Referring Jamise Pentland: Glendon Axe Treating Abisola Carrero/Extender: STONE III, HOYT Weeks in Treatment: 5 Active Problems Location of Pain Severity and Description of Pain Patient Has Paino No Site Locations Pain Management and Medication Current Pain Management: Goals for Pain Management pt denies any pain at this time. Electronic Signature(s) Signed: 04/12/2018 3:39:15 PM By: Secundino Ginger Entered By: Secundino Ginger on 04/12/2018 14:48:48 Christine [name] ([id number]) -------------------------------------------------------------------------------- Patient/Caregiver Education Details Patient Name: Christine [name], Christine [name]. Date of Service: 04/12/2018 2:15 PM Medical Record Number: [id number] Patient Account Number: [id number] Date of Birth/Gender: [birth year] (73 y.o. F) Treating RN: Cornell Barman Primary Care Physician: Glendon Axe Other Clinician: Referring Physician: Glendon Axe Treating Physician/Extender: Sharalyn Ink in Treatment: 5 Education Assessment Education Provided To: Patient Education Topics Provided Wound/Skin Impairment: Handouts: Caring for Your Ulcer Methods:  Demonstration, Explain/Verbal Responses: State content correctly Electronic Signature(s) Signed: 04/12/2018 3:24:13 PM By: Gretta Cool, BSN, RN, CWS, Kim RN, BSN Entered By: Gretta Cool, BSN, RN, CWS, Kim on 04/12/2018 15:10:14 Christine [name] ([id number]) -------------------------------------------------------------------------------- Wound Assessment Details Patient Name: Christine [name], Christine [name]. Date of Service: 04/12/2018 2:15  PM Medical Record Number: [id number] Patient Account Number: [id number] Date of Birth/Sex: 01-02-1946 (72 y.o. F) Treating RN: Secundino Ginger Primary Care Chiffon Kittleson: Glendon Axe Other Clinician: Referring Bhavya Grand: Glendon Axe Treating Lanika Colgate/Extender: STONE III, HOYT Weeks in Treatment: 5 Wound Status Wound Number: 1 Primary Malignant Wound Etiology: Wound Location: Left Breast Wound Open Wounding Event: Gradually Appeared Status: Date Acquired: 06/16/2016 Comorbid Cataracts, Congestive Heart Failure, Coronary Weeks Of Treatment: 5 History: Artery Disease, Hypertension, Myocardial Clustered Wound: No Infarction, Peripheral Arterial Disease, Osteoarthritis, Received Chemotherapy Photos Photo Uploaded By: Secundino Ginger on 04/12/2018 15:29:30 Wound Measurements Length: (cm) 1.5 Width: (cm) 1 Depth: (cm) 0.1 Area: (cm) 1.178 Volume: (cm) 0.118 % Reduction in Area: 92.8% % Reduction in Volume: 92.8% Epithelialization: None Tunneling: No Undermining: No Wound Description Full Thickness Without Exposed Support Classification: Structures Wound Margin: Flat and Intact Exudate Medium Amount: Exudate Type: Serosanguineous Exudate Color: red, brown Foul Odor After Cleansing: No Slough/Fibrino Yes Wound Bed Granulation Amount: None Present (0%) Exposed Structure Necrotic Amount: Large (67-100%) Fascia Exposed: No Necrotic Quality: Eschar, Adherent Slough Fat Layer (Subcutaneous Tissue) Exposed: Yes Tendon Exposed: No Muscle Exposed: No Joint Exposed: No Christine [name], Christine [name]. ([id number]) Bone Exposed: No Periwound Skin Texture Texture Color No Abnormalities Noted: No No Abnormalities Noted: No Callus: No Atrophie Blanche: No Crepitus: No Cyanosis: No Excoriation: No Ecchymosis: No Induration: No Erythema: No Rash: No Hemosiderin Staining: No Scarring: Yes Mottled: No Pallor: No Moisture Rubor: No No Abnormalities Noted: No Dry / Scaly: No Temperature /  Pain Maceration: No Tenderness on Palpation: Yes Wound Preparation Ulcer Cleansing: Rinsed/Irrigated with Saline Topical Anesthetic Applied: None Treatment Notes Wound #1 (Left Breast) 1. Cleansed with: Clean wound with Normal Saline 2. Anesthetic Topical Lidocaine 4% cream to wound bed prior to debridement 4. Dressing Applied: Other dressing (specify in notes) Notes [place] with telfa Electronic Signature(s) Signed: 04/12/2018 3:39:15 PM By: Secundino Ginger Entered By: Secundino Ginger on 04/12/2018 14:58:02 Christine [name] ([id number]) -------------------------------------------------------------------------------- [place] Details Patient Name: Christine [name]. Date of Service: 04/12/2018 2:15 PM Medical Record Number: [id number] Patient Account Number: [id number] Date of Birth/Sex: 08-Feb-1946 (73 y.o. F) Treating RN: Secundino Ginger Primary Care Tyeler Goedken: Glendon Axe Other Clinician: Referring Rion Schnitzer: Glendon Axe Treating Murrell Dome/Extender: STONE III, HOYT Weeks in Treatment: 5 Vital Signs Time Taken: 14:49 Temperature (F): 97.9 Height (in): 62 Pulse (bpm): 55 Weight (lbs): 88.1 Respiratory Rate (breaths/min): 16 Body Mass Index (BMI): 16.1 Blood Pressure (mmHg): 125/65 Reference Range: 80 - 120 mg / dl Electronic Signature(s) Signed: 04/12/2018 3:39:15 PM By: Secundino Ginger Entered By: Secundino Ginger on 04/12/2018 14:50:28

## 2018-04-15 NOTE — Progress Notes (Signed)
JAMISEN, HAWES ([id number]) Visit Report for 04/12/2018 Chief Complaint Document Details Patient Name: Christine [name], Christine [name]. Date of Service: 04/12/2018 2:15 PM Medical Record Number: [id number] Patient Account Number: [id number] Date of Birth/Sex: September 29, 1945 (72 y.o. F) Treating RN: Cornell Barman Primary Care Provider: Glendon Axe Other Clinician: Referring Provider: Glendon Axe Treating Provider/Extender: Melburn Hake, Bridey Brookover Weeks in Treatment: 5 Information Obtained from: Patient Chief Complaint Left breast ulcer Electronic Signature(s) Signed: 04/14/2018 1:38:45 AM By: Worthy Keeler PA-C Entered By: Worthy Keeler on 04/12/2018 14:18:13 Christine [name] ([id number]) -------------------------------------------------------------------------------- HPI Details Patient Name: Christine [name]. Date of Service: 04/12/2018 2:15 PM Medical Record Number: [id number] Patient Account Number: [id number] Date of Birth/Sex: 12-22-1945 (72 y.o. F) Treating RN: Cornell Barman Primary Care Provider: Glendon Axe Other Clinician: Referring Provider: Glendon Axe Treating Provider/Extender: STONE III, Kindel Rochefort Weeks in Treatment: 5 History of Present Illness Associated Signs and Symptoms: Patient has a history of hypertension, breast cancer, she is a current tobacco user, and she does have depression. HPI Description: 03/02/18 on evaluation today patient is seen for initial evaluation here in our clinic concerning issues that she has been having with an ulcer over the left breast area involving the nipple which is a result of Carcinoma. With that being said the patient appears to have received intermittent treatment due to discontinuing follow-up at times. This was initially back in 2013 and then subsequently on July 08, 2016 she had a pet scan suggesting the possibility of metastatic lesions in her long. They were too small to characterize however. She received one dose of chemotherapy July 30, 2016  again and then subsequently did not do follow-up for any additional follow-up in the interim. She is has a 10 according to records to undergo surgery or chemotherapy. With that being said she has had the wound on her breast foot seems to be quite a while possibly even as much as a year and a half having open January 2018. 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 She was referred to [place] by Dr. Grayland Ormond regarding the ulcer itself. Her current treatment plan was discussed to not necessarily be curative although it was the only thing she really wanted to pursue at this point. Nonetheless she does state that she understands all of this mentioned above. Nonetheless she's currently been using triple antibiotic ointment along with a Band-Aid over the wound area. She does have discomfort in fact she really didn't want anybody touching the area today due to health painful that was. Fortunately there's no evidence of overt infection at this point. No fevers chills noted 03/16/18 on evaluation today patient actually appears to be doing fairly well in regard to her left breast wound. She has been tolerating the dressing changes specifically the silver cell without complication. She states she's having much less pain and overall feels much better with this then she has with anything previous. The wound not staying so wet has seem to have been great benefit for her. In general the patient states that she's having no other major problems and complications at this time which is good news. No fevers, chills, nausea, or vomiting noted at this time. 04/12/18 on evaluation today patient actually appears to be doing a little better in regard to her left breast ulcer. This actually appears to be for the most part dry and a lot of the drain/eschar overlying the surface of what was all a wound initially  appears to be doing much better. There is very  little drainage that she has noted since I last saw her about three weeks ago. Fortunately there does not appear to be any evidence of infection at this time. She seems to be tolerating the treatment that Dr. Grayland Ormond has initiated at this point without complication. Electronic Signature(s) Signed: 04/14/2018 1:38:45 AM By: Worthy Keeler PA-C Entered By: Worthy Keeler on 04/12/2018 15:11:27 Christine [name] ([id number]) -------------------------------------------------------------------------------- Physical Exam Details Patient Name: Christine [name], Christine [name]. Date of Service: 04/12/2018 2:15 PM Medical Record Number: [id number] Patient Account Number: [id number] Date of Birth/Sex: 03/15/1946 (72 y.o. F) Treating RN: Cornell Barman Primary Care Provider: Glendon Axe Other Clinician: Referring Provider: Glendon Axe Treating Provider/Extender: STONE III, Patrick Salemi Weeks in Treatment: 5 Constitutional Well-nourished and well-hydrated in no acute distress. Respiratory normal breathing without difficulty. clear to auscultation bilaterally. Cardiovascular regular rate and rhythm with normal S1, S2. Psychiatric this patient is able to make decisions and demonstrates good insight into disease process. Alert and Oriented x 3. pleasant and cooperative. Notes Patient's wound bed currently again shows mainly eschar/dried actually date noted over the surface of what was open as far as the wound is concerned. She has a very small area of drainage that was noted on the dressing that she had on upon arrival today. This was minimal. Nonetheless I think that the wound bed does appear to be doing better from the standpoint of opening. She states it actually feels tremendously better. Nonetheless I'm still not sure what the end result of this in the current treatment that she is undergoing with Dr. Grayland Ormond is going to be. Electronic Signature(s) Signed: 04/14/2018 1:38:45 AM By: Worthy Keeler PA-C Entered By:  Worthy Keeler on 04/12/2018 15:12:13 Christine [name] ([id number]) -------------------------------------------------------------------------------- Physician Orders Details Patient Name: EMERLYN, MEHLHOFF. Date of Service: 04/12/2018 2:15 PM Medical Record Number: [id number] Patient Account Number: [id number] Date of Birth/Sex: 11/19/1945 (72 y.o. F) Treating RN: Cornell Barman Primary Care Provider: Glendon Axe Other Clinician: Referring Provider: Glendon Axe Treating Provider/Extender: STONE III, Zhara Gieske Weeks in Treatment: 5 Verbal / Phone Orders: No Diagnosis Coding ICD-10 Coding Code Description C50.912 Malignant neoplasm of unspecified site of left female breast L98.492 Non-pressure chronic ulcer of skin of other sites with fat layer exposed Z72.0 Tobacco use I10 Essential (primary) hypertension Wound Cleansing Wound #1 Left Breast o May Shower, gently pat wound dry prior to applying new dressing. Primary Wound Dressing Wound #1 Left Breast o Silver Alginate Secondary Dressing Wound #1 Left Breast o [place] - or bandaid Dressing Change Frequency Wound #1 Left Breast o Change dressing every other day. Follow-up Appointments Wound #1 Left Breast o Return Appointment in 1 month Electronic Signature(s) Signed: 04/12/2018 3:24:13 PM By: Gretta Cool, BSN, RN, CWS, Kim RN, BSN Signed: 04/14/2018 1:38:45 AM By: Worthy Keeler PA-C Entered By: Gretta Cool, BSN, RN, CWS, Kim on 04/12/2018 15:07:32 Christine [name] ([id number]) -------------------------------------------------------------------------------- Problem List Details Patient Name: VERLON, CARCIONE. Date of Service: 04/12/2018 2:15 PM Medical Record Number: [id number] Patient Account Number: [id number] Date of Birth/Sex: 1946-05-26 (72 y.o. F) Treating RN: Cornell Barman Primary Care Provider: Glendon Axe Other Clinician: Referring Provider: Glendon Axe Treating Provider/Extender: Melburn Hake, Elmire Amrein Weeks in  Treatment: 5 Active Problems ICD-10 Evaluated Encounter Code Description Active Date Today Diagnosis C50.912 Malignant neoplasm of unspecified site of left female breast 03/02/2018 No Yes L98.492 Non-pressure chronic ulcer of skin of other sites with fat layer 03/02/2018 No Yes exposed Z72.0  Tobacco use 03/02/2018 No Yes I10 Essential (primary) hypertension 03/02/2018 No Yes Inactive Problems Resolved Problems Electronic Signature(s) Signed: 04/14/2018 1:38:45 AM By: Worthy Keeler PA-C Entered By: Worthy Keeler on 04/12/2018 14:18:09 Christine [name] ([id number]) -------------------------------------------------------------------------------- Progress Note Details Patient Name: Christine [name]. Date of Service: 04/12/2018 2:15 PM Medical Record Number: [id number] Patient Account Number: [id number] Date of Birth/Sex: Jun 14, 1946 (72 y.o. F) Treating RN: Cornell Barman Primary Care Provider: Glendon Axe Other Clinician: Referring Provider: Glendon Axe Treating Provider/Extender: STONE III, Milany Geck Weeks in Treatment: 5 Subjective Chief Complaint Information obtained from Patient Left breast ulcer History of Present Illness (HPI) The following HPI elements were documented for the patient's wound: Associated Signs and Symptoms: Patient has a history of hypertension, breast cancer, she is a current tobacco user, and she does have depression. 03/02/18 on evaluation today patient is seen for initial evaluation here in our clinic concerning issues that she has been having with an ulcer over the left breast area involving the nipple which is a result of Carcinoma. With that being said the patient appears to have received intermittent treatment due to discontinuing follow-up at times. This was initially back in 2013 and then subsequently on July 08, 2016 she had a pet scan suggesting the possibility of metastatic lesions in her long. They were too small to characterize however. She received  one dose of chemotherapy July 30, 2016 again and then subsequently did not do follow-up for any additional follow-up in the interim. She is has a 10 according to records to undergo surgery or chemotherapy. With that being said she has had the wound on her breast foot seems to be quite a while possibly even as much as a year and a half having open January 2018. 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 She was referred to [place] by Dr. Grayland Ormond regarding the ulcer itself. Her current treatment plan was discussed to not necessarily be curative although it was the only thing she really wanted to pursue at this point. Nonetheless she does state that she understands all of this mentioned above. Nonetheless she's currently been using triple antibiotic ointment along with a Band-Aid over the wound area. She does have discomfort in fact she really didn't want anybody touching the area today due to health painful that was. Fortunately there's no evidence of overt infection at this point. No fevers chills noted 03/16/18 on evaluation today patient actually appears to be doing fairly well in regard to her left breast wound. She has been tolerating the dressing changes specifically the silver cell without complication. She states she's having much less pain and overall feels much better with this then she has with anything previous. The wound not staying so wet has seem to have been great benefit for her. In general the patient states that she's having no other major problems and complications at this time which is good news. No fevers, chills, nausea, or vomiting noted at this time. 04/12/18 on evaluation today patient actually appears to be doing a little better in regard to her left breast ulcer. This actually appears to be for the most part dry and a lot of the drain/eschar overlying the surface of what was all a wound initially  appears to be doing much better. There is very little drainage that she has noted since I last saw her about three weeks ago. Fortunately there does not appear to be any evidence of  infection at this time. She seems to be tolerating the treatment that Dr. Grayland Ormond has initiated at this point without complication. Patient History Information obtained from Patient. Family History Heart Disease - Father,Paternal Grandparents, Hypertension - Father,Child, Stroke - Mother, No family history of Cancer, Diabetes, Kidney Disease, Lung Disease, Seizures, Thyroid Problems, Tuberculosis. Social History Current every day smoker, Marital Status - Divorced, Alcohol Use - Never, Drug Use - No History, Caffeine Use - Daily. JOHANA, HOPKINSON ([id number]) Medical And Surgical History Notes Constitutional Symptoms (General Health) Defibulator; Cancer Stage III; HTN; High Cholesterol, Oncologic Breast Cancer-Only 2 treatment of Chemo one in 2012; one in 2018 Review of Systems (ROS) Constitutional Symptoms (General Health) Denies complaints or symptoms of Fever, Chills. Respiratory The patient has no complaints or symptoms. Cardiovascular The patient has no complaints or symptoms. Psychiatric The patient has no complaints or symptoms. Objective Constitutional Well-nourished and well-hydrated in no acute distress. Vitals Time Taken: 2:49 PM, Height: 62 in, Weight: 88.1 lbs, BMI: 16.1, Temperature: 97.9 F, Pulse: 55 bpm, Respiratory Rate: 16 breaths/min, Blood Pressure: 125/65 mmHg. Respiratory normal breathing without difficulty. clear to auscultation bilaterally. Cardiovascular regular rate and rhythm with normal S1, S2. Psychiatric this patient is able to make decisions and demonstrates good insight into disease process. Alert and Oriented x 3. pleasant and cooperative. General Notes: Patient's wound bed currently again shows mainly eschar/dried actually date noted over the surface of what was  open as far as the wound is concerned. She has a very small area of drainage that was noted on the dressing that she had on upon arrival today. This was minimal. Nonetheless I think that the wound bed does appear to be doing better from the standpoint of opening. She states it actually feels tremendously better. Nonetheless I'm still not sure what the end result of this in the current treatment that she is undergoing with Dr. Grayland Ormond is going to be. Integumentary (Hair, Skin) Wound #1 status is Open. Original cause of wound was Gradually Appeared. The wound is located on the Left Breast. The wound measures 1.5cm length x 1cm width x 0.1cm depth; 1.178cm^2 area and 0.118cm^3 volume. There is Fat Layer (Subcutaneous Tissue) Exposed exposed. There is no tunneling or undermining noted. There is a medium amount of serosanguineous drainage noted. The wound margin is flat and intact. There is no granulation within the wound bed. There is a large (67-100%) amount of necrotic tissue within the wound bed including Eschar and Adherent Slough. The periwound skin appearance exhibited: Scarring. The periwound skin appearance did not exhibit: Callus, Crepitus, Excoriation, Induration, Rash, Dry/Scaly, Maceration, Atrophie Blanche, Cyanosis, Ecchymosis, Hemosiderin Staining, Mottled, Pallor, Rubor, KIMARIA, STRUTHERS. ([id number]) Erythema. The periwound has tenderness on palpation. Assessment Active Problems ICD-10 Malignant neoplasm of unspecified site of left female breast Non-pressure chronic ulcer of skin of other sites with fat layer exposed Tobacco use Essential (primary) hypertension Plan Wound Cleansing: Wound #1 Left Breast: May Shower, gently pat wound dry prior to applying new dressing. Primary Wound Dressing: Wound #1 Left Breast: Silver Alginate Secondary Dressing: Wound #1 Left Breast: [place] - or bandaid Dressing Change Frequency: Wound #1 Left Breast: Change dressing every other  day. Follow-up Appointments: Wound #1 Left Breast: Return Appointment in 1 month I'm gonna see the patient back for reevaluation in one months time to see were things stand. She's in agreement with plan. If anything changes in the meantime shall contact the office and let me know. Otherwise I'm glad that she seems to  be feeling better. Please see above for specific wound care orders. We will see patient for re-evaluation in 4 week(s) here in the clinic. If anything worsens or changes patient will contact our office for additional recommendations. Electronic Signature(s) Signed: 04/14/2018 1:38:45 AM By: Worthy Keeler PA-C Entered By: Worthy Keeler on 04/12/2018 15:13:05 Christine [name] ([id number]) -------------------------------------------------------------------------------- ROS/PFSH Details Patient Name: HAEDYN, BREAU. Date of Service: 04/12/2018 2:15 PM Medical Record Number: [id number] Patient Account Number: [id number] Date of Birth/Sex: 1945-11-11 (72 y.o. F) Treating RN: Cornell Barman Primary Care Provider: Glendon Axe Other Clinician: Referring Provider: Glendon Axe Treating Provider/Extender: STONE III, Mychele Seyller Weeks in Treatment: 5 Information Obtained From Patient Wound History Do you currently have one or more open woundso Yes How many open wounds do you currently haveo 1 Approximately how long have you had your woundso 1.8 months How have you been treating your wound(s) until nowo neosporin Has your wound(s) ever healed and then re-openedo No Have you had any lab work done in the past montho No Have you tested positive for an antibiotic resistant organism (MRSA, VRE)o No Constitutional Symptoms (General Health) Complaints and Symptoms: Negative for: Fever; Chills Medical History: Past Medical History Notes: Defibulator; Cancer Stage III; HTN; High Cholesterol, Eyes Medical History: Positive for: Cataracts Negative for: Glaucoma; Optic  Neuritis Ear/Nose/Mouth/Throat Medical History: Negative for: Chronic sinus problems/congestion; Middle ear problems Hematologic/Lymphatic Medical History: Negative for: Anemia; Hemophilia; Human Immunodeficiency Virus; Lymphedema; Sickle Cell Disease Respiratory Complaints and Symptoms: No Complaints or Symptoms Medical History: Negative for: Aspiration; Asthma; Chronic Obstructive Pulmonary Disease (COPD); Pneumothorax; Sleep Apnea; Tuberculosis Cardiovascular Complaints and Symptoms: No Complaints or Symptoms JAQUELYNE, FIRKUS ([id number]) Medical History: Positive for: Congestive Heart Failure; Coronary Artery Disease; Hypertension; Myocardial Infarction; Peripheral Arterial Disease Negative for: Angina; Arrhythmia; Deep Vein Thrombosis; Hypotension; Peripheral Venous Disease; Phlebitis; Vasculitis Gastrointestinal Medical History: Negative for: Cirrhosis ; Colitis; Crohnos; Hepatitis A; Hepatitis B Endocrine Medical History: Negative for: Type I Diabetes; Type II Diabetes Genitourinary Medical History: Negative for: End Stage Renal Disease Immunological Medical History: Negative for: Lupus Erythematosus; Raynaudos; Scleroderma Integumentary (Skin) Medical History: Negative for: History of Burn; History of pressure wounds Musculoskeletal Medical History: Positive for: Osteoarthritis Negative for: Gout; Rheumatoid Arthritis; Osteomyelitis Neurologic Medical History: Negative for: Dementia; Neuropathy; Quadriplegia; Paraplegia; Seizure Disorder Oncologic Medical History: Positive for: Received Chemotherapy Past Medical History Notes: Breast Cancer-Only 2 treatment of Chemo one in 2012; one in 2018 Psychiatric Complaints and Symptoms: No Complaints or Symptoms Medical History: Negative for: Anorexia/bulimia; Confinement Anxiety HBO Extended History Items Eyes: Cataracts ALOURA, MATSUOKA ([id number]) Immunizations Pneumococcal Vaccine: Received Pneumococcal  Vaccination: Yes Implantable Devices Family and Social History Cancer: No; Diabetes: No; Heart Disease: Yes - Father,Paternal Grandparents; Hypertension: Yes - Father,Child; Kidney Disease: No; Lung Disease: No; Seizures: No; Stroke: Yes - Mother; Thyroid Problems: No; Tuberculosis: No; Current every day smoker; Marital Status - Divorced; Alcohol Use: Never; Drug Use: No History; Caffeine Use: Daily; Advanced Directives: No; Patient does not want information on Advanced Directives; Living Will: No; Medical Power of Attorney: No Physician Affirmation I have reviewed and agree with the above information. Electronic Signature(s) Signed: 04/12/2018 3:24:13 PM By: Gretta Cool, BSN, RN, CWS, Kim RN, BSN Signed: 04/14/2018 1:38:45 AM By: Worthy Keeler PA-C Entered By: Worthy Keeler on 04/12/2018 15:11:43 Christine [name] ([id number]) -------------------------------------------------------------------------------- SuperBill Details Patient Name: MELONEY, FELD. Date of Service: 04/12/2018 Medical Record Number: [id number] Patient Account Number: [id number] Date of Birth/Sex: 1945/07/10 (72 y.o. F) Treating RN: Gretta Cool,  [place] Primary Care Provider: Glendon Axe Other Clinician: Referring Provider: Glendon Axe Treating Provider/Extender: STONE III, Sharae Zappulla Weeks in Treatment: 5 Diagnosis Coding ICD-10 Codes Code Description C50.912 Malignant neoplasm of unspecified site of left female breast L98.492 Non-pressure chronic ulcer of skin of other sites with fat layer exposed Z72.0 Tobacco use I10 Essential (primary) hypertension Facility Procedures CPT4 Code: [id number] Description: 99213 - WOUND CARE VISIT-LEV 3 EST PT Modifier: Quantity: 1 Physician Procedures CPT4 Code: [id number] Description: 37357 - WC PHYS LEVEL 3 - EST PT ICD-10 Diagnosis Description C50.912 Malignant neoplasm of unspecified site of left female breast L98.492 Non-pressure chronic ulcer of skin of other sites with fat l Z72.0  Tobacco use I10 Essential (primary)  hypertension Modifier: ayer exposed Quantity: 1 Electronic Signature(s) Signed: 04/14/2018 1:38:45 AM By: Worthy Keeler PA-C Entered By: Worthy Keeler on 04/12/2018 15:13:47

## 2018-04-15 NOTE — Progress Notes (Signed)
Patient here today for follow up breast cancer. Patient reports she is tolerating Tamoxifen well.

## 2018-05-10 ENCOUNTER — Ambulatory Visit: Payer: Medicare Other | Admitting: Physician Assistant

## 2018-05-16 NOTE — Progress Notes (Deleted)
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  INTERVAL HISTORY: Patient returns to clinic today for further evaluation and assess her toleration of tamoxifen.  She is tolerating her treatments well without significant side effects.  She has noticed an improvement of her wound on her left breast.  She currently feels well and is asymptomatic. She has no neurologic complaints. She denies any recent fevers or illnesses. She has a good appetite and denies weight loss.  She denies any chest pain, hemoptysis, cough, or shortness of breath.  She has no nausea, vomiting, constipation, or diarrhea. She has no urinary complaints.  Patient offers no further specific complaints today.  REVIEW OF SYSTEMS:   Review of Systems  Constitutional: Negative.  Negative for fever, malaise/fatigue and weight loss.  Respiratory: Negative.  Negative for cough and shortness of breath.   Cardiovascular: Negative.  Negative for chest pain and leg swelling.  Gastrointestinal: Negative.  Negative for abdominal pain.  Genitourinary: Negative.  Negative for dysuria.  Musculoskeletal: Negative.  Negative for back pain.  Skin: Negative.  Negative for rash.  Neurological: Negative.  Negative for dizziness, focal weakness, weakness and headaches.  Psychiatric/Behavioral: The patient is nervous/anxious.     As per HPI. Otherwise, a complete review of systems is negative.  PAST MEDICAL HISTORY: Past Medical History:  Diagnosis Date  . Allergic rhinitis   . Anxiety   . Breast cancer ([place])   . Chronic systolic heart failure ([place])   . Coronary artery disease   . GERD (gastroesophageal reflux disease)   . Heart  murmur   . Hyperlipidemia   . Hypertension   . Implantable defibrillator [employer]    DOI 2006  . MI (myocardial infarction) ([place])   . Osteoarthritis     PAST SURGICAL HISTORY: Past Surgical History:  Procedure Laterality Date  . CHF exacerbation  10/07  . CORONARY STENT PLACEMENT  8/06   3 stents; myocardial infarction  . NM MYOVIEW LTD  1/08   EF 39%, no sx ischemia  . PACEMAKER PLACEMENT  2/07   Defibrillator    FAMILY HISTORY: Family History  Problem Relation Age of Onset  . Stroke Mother 39  . Heart attack Father   . Coronary artery disease Father   . Heart attack Brother   . Coronary artery disease Other   . Hypertension Other   . Cancer Neg Hx        No breast or colon cancer    ADVANCED DIRECTIVES (Y/N):  N  HEALTH MAINTENANCE: Social History   Tobacco Use  . Smoking status: Current Every Day Smoker    Packs/day: 0.50  . Smokeless tobacco: Never Used  . Tobacco comment: also uses E-cig  Substance Use Topics  . Alcohol use: No    Comment: 2-3 times per month  . Drug use: No     Colonoscopy:  PAP:  Bone density:  Lipid panel:  Allergies  Allergen Reactions  . Atorvastatin     REACTION: increased LFT's  . Lisinopril     REACTION: cough  . Oxycodone Hcl     REACTION: unspecified  . Simvastatin     REACTION: myalgias    Current Outpatient Medications  Medication Sig Dispense Refill  .  albuterol (VENTOLIN HFA) 108 (90 Base) MCG/ACT inhaler INHALE 2 PUFFS BY MOUTH 4 TIMES A DAY ASNEEDED    . aspirin 81 MG tablet Take 81 mg by mouth daily.    . bisacodyl (DULCOLAX) 5 MG EC tablet Take 5 mg by mouth daily as needed for mild constipation or moderate constipation.    . carvedilol (COREG) 25 MG tablet TAKE 1 TABLET BY MOUTH TWICE A DAY 60 tablet 2  . cetirizine (ZYRTEC) 10 MG tablet TAKE 1 TABLET BY MOUTH ONCE DAILY    . Cholecalciferol (VITAMIN D) 2000 UNITS tablet Take 2,000 Units by mouth daily.    Marland Kitchen dicyclomine (BENTYL) 20 MG tablet Take by  mouth.    . furosemide (LASIX) 40 MG tablet Take 40 mg by mouth as needed.    Marland Kitchen HYDROcodone-acetaminophen (NORCO/VICODIN) 5-325 MG tablet Take 1 tablet by mouth every 6 (six) hours as needed for moderate pain. 30 tablet 0  . lidocaine-prilocaine (EMLA) cream Apply to affected area once 30 g 3  . loratadine (CLARITIN) 10 MG tablet Take by mouth.    Marland Kitchen LORazepam (ATIVAN) 0.5 MG tablet Take 0.5 mg by mouth 2 (two) times daily.     . Multiple Vitamin (MULTIVITAMIN) tablet Take 1 tablet by mouth daily.    . Omega-3 Fatty Acids (OMEGA 3 PO) Take by mouth daily.    Marland Kitchen omeprazole (PRILOSEC) 20 MG capsule Take 20 mg by mouth 2 (two) times daily.      . pantoprazole (PROTONIX) 40 MG tablet TAKE 1 TABLET BY MOUTH ONCE DAILY    . Polyethylene Glycol 3350 (PEG 3350) POWD Take by mouth.    . rosuvastatin (CRESTOR) 10 MG tablet Take 10 mg by mouth at bedtime.      . tamoxifen (NOLVADEX) 20 MG tablet Take 1 tablet (20 mg total) by mouth daily. 30 tablet 3  . traMADol (ULTRAM) 50 MG tablet Take 50 mg by mouth every 6 (six) hours as needed for moderate pain.     . traMADol (ULTRAM) 50 MG tablet TAKE 1 TABLET BY MOUTH EVERY 8 HOURS AS NEEDED PAIN    . valsartan (DIOVAN) 320 MG tablet Take 320 mg by mouth daily.     No current facility-administered medications for this visit.     OBJECTIVE: There were no vitals filed for this visit.   There is no height or weight on file to calculate BMI.    ECOG FS:0 - Asymptomatic  General: Well-developed, well-nourished, no acute distress. Eyes: Pink conjunctiva, anicteric sclera. HEENT: Normocephalic, moist mucous membranes. Breast: Left breast mass and axillary lymphadenopathy reduced in size.  Skin ulceration healing.  Lungs: Clear to auscultation bilaterally. Heart: Regular rate and rhythm. No rubs, murmurs, or gallops. Abdomen: Soft, nontender, nondistended. No organomegaly noted, normoactive bowel sounds. Musculoskeletal: No edema, cyanosis, or clubbing. Neuro:  Alert, answering all questions appropriately. Cranial nerves grossly intact. Skin: No rashes or petechiae noted. Psych: Normal affect.  LAB RESULTS:  Lab Results  Component Value Date   NA 139 03/09/2018   K 5.1 03/09/2018   CL 103 03/09/2018   CO2 27 03/09/2018   GLUCOSE 89 03/09/2018   BUN 10 03/09/2018   CREATININE 0.87 03/09/2018   CALCIUM 9.6 03/09/2018   PROT 6.9 03/09/2018   ALBUMIN 4.2 03/09/2018   AST 23 03/09/2018   ALT 11 03/09/2018   ALKPHOS 82 03/09/2018   BILITOT 0.7 03/09/2018   GFRNONAA >60 03/09/2018   GFRAA >60 03/09/2018    Lab Results  Component  Value Date   WBC 5.9 03/09/2018   NEUTROABS 3.4 03/09/2018   HGB 12.8 03/09/2018   HCT 38.0 03/09/2018   MCV 91.5 03/09/2018   PLT 127 (L) 03/09/2018     STUDIES: No results found.  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  PLAN:    1. 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 She received one dose of chemotherapy using Taxotere and Cytoxan in 2013. She did not receive any XRT or hormonal treatment. PET scan results from July 08, 2016 reviewed independently possibly suggesting metastatic lesions in her lung, but these are too small to characterize.  This will need to be repeated in the near future.  Patient received 1 dose of Taxotere and Cytoxan again on July 30, 2016, but then did not return for any further follow-up.  Patient admits she is hesitant to undergo surgery or chemotherapy, and given her history of compliance is unclear if XRT would offer any benefit.  Patient could not tolerate letrozole therefore this was discontinued.  She is tolerating tamoxifen well.  Return to clinic in 1 month for further evaluation.    2.  Breast wound: Improving.  Appreciate wound care clinic  input.  I spent a total of 30 minutes face-to-face with the patient of which greater than 50% of the visit was spent in counseling and coordination of care as detailed above.   Patient expressed understanding and was in agreement with this plan. She also understands that She can call clinic at any time with any questions, concerns, or complaints.   Cancer Staging Breast cancer of upper-inner quadrant of right female breast [name]) Staging form: Breast, AJCC 8th Edition - Clinical: Stage IIIB (cT4b, cN1, cM0, G2, ER: Positive, PR: Positive, HER2: Negative) - Signed by Lloyd Huger, MD on 07/01/2016   Lloyd Huger, MD   05/16/2018 2:09 PM

## 2018-05-18 ENCOUNTER — Inpatient Hospital Stay: Payer: Medicare Other | Admitting: Oncology

## 2018-06-01 ENCOUNTER — Encounter: Payer: Medicare Other | Attending: Physician Assistant | Admitting: Physician Assistant

## 2018-06-01 DIAGNOSIS — I2510 Atherosclerotic heart disease of native coronary artery without angina pectoris: Secondary | ICD-10-CM | POA: Diagnosis not present

## 2018-06-01 DIAGNOSIS — L98492 Non-pressure chronic ulcer of skin of other sites with fat layer exposed: Secondary | ICD-10-CM | POA: Diagnosis not present

## 2018-06-01 DIAGNOSIS — I509 Heart failure, unspecified: Secondary | ICD-10-CM | POA: Diagnosis not present

## 2018-06-01 DIAGNOSIS — E7800 Pure hypercholesterolemia, unspecified: Secondary | ICD-10-CM | POA: Diagnosis not present

## 2018-06-01 DIAGNOSIS — Z853 Personal history of malignant neoplasm of breast: Secondary | ICD-10-CM | POA: Insufficient documentation

## 2018-06-01 DIAGNOSIS — I110 Hypertensive heart disease with heart failure: Secondary | ICD-10-CM | POA: Diagnosis not present

## 2018-06-01 DIAGNOSIS — I252 Old myocardial infarction: Secondary | ICD-10-CM | POA: Insufficient documentation

## 2018-06-01 DIAGNOSIS — F17200 Nicotine dependence, unspecified, uncomplicated: Secondary | ICD-10-CM | POA: Insufficient documentation

## 2018-06-03 NOTE — Progress Notes (Signed)
JAYLE, SOLARZ ([id number]) Visit Report for 06/01/2018 Chief Complaint Document Details Patient Name: Christine [name], Christine [name]. Date of Service: 06/01/2018 2:00 PM Medical Record Number: [id number] Patient Account Number: [id number] Date of Birth/Sex: [birth year] (72 y.o. F) Treating RN: Montey Hora Primary Care Provider: Glendon Axe Other Clinician: Referring Provider: Glendon Axe Treating Provider/Extender: Melburn Hake, Myonna Chisom Weeks in Treatment: 13 Information Obtained from: Patient Chief Complaint Left breast ulcer Electronic Signature(s) Signed: 06/01/2018 6:07:41 PM By: Worthy Keeler PA-C Entered By: Worthy Keeler on 06/01/2018 14:07:44 Christine [name] ([id number]) -------------------------------------------------------------------------------- HPI Details Patient Name: Christine [name]. Date of Service: 06/01/2018 2:00 PM Medical Record Number: [id number] Patient Account Number: [id number] Date of Birth/Sex: 1946-05-09 (73 y.o. F) Treating RN: Montey Hora Primary Care Provider: Glendon Axe Other Clinician: Referring Provider: Glendon Axe Treating Provider/Extender: STONE III, Ladaja Yusupov Weeks in Treatment: 13 History of Present Illness Associated Signs and Symptoms: Patient has a history of hypertension, breast cancer, she is a current tobacco user, and she does have depression. HPI Description: 03/02/18 on evaluation today patient is seen for initial evaluation here in our clinic concerning issues that she has been having with an ulcer over the left breast area involving the nipple which is a result of Carcinoma. With that being said the patient appears to have received intermittent treatment due to discontinuing follow-up at times. This was initially back in 2013 and then subsequently on July 08, 2016 she had a pet scan suggesting the possibility of metastatic lesions in her long. They were too small to characterize however. She received one dose of chemotherapy July 30, 2016 again and then subsequently did not do follow-up for any additional follow-up in the interim. She is has a 10 according to records to undergo surgery or chemotherapy. With that being said she has had the wound on her breast foot seems to be quite a while possibly even as much as a year and a half having open January 2018. 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 She was referred to [place] by Dr. Grayland Ormond regarding the ulcer itself. Her current treatment plan was discussed to not necessarily be curative although it was the only thing she really wanted to pursue at this point. Nonetheless she does state that she understands all of this mentioned above. Nonetheless she's currently been using triple antibiotic ointment along with a Band-Aid over the wound area. She does have discomfort in fact she really didn't want anybody touching the area today due to health painful that was. Fortunately there's no evidence of overt infection at this point. No fevers chills noted 03/16/18 on evaluation today patient actually appears to be doing fairly well in regard to her left breast wound. She has been tolerating the dressing changes specifically the silver cell without complication. She states she's having much less pain and overall feels much better with this then she has with anything previous. The wound not staying so wet has seem to have been great benefit for her. In general the patient states that she's having no other major problems and complications at this time which is good news. No fevers, chills, nausea, or vomiting noted at this time. 04/12/18 on evaluation today patient actually appears to be doing a little better in regard to her left breast ulcer. This actually appears to be for the most part dry and a lot of the drain/eschar overlying the surface of what was all a wound initially  appears to be doing much better. There  is very little drainage that she has noted since I last saw her about three weeks ago. Fortunately there does not appear to be any evidence of infection at this time. She seems to be tolerating the treatment that Dr. Grayland Ormond has initiated at this point without complication. 06/01/18 on evaluation today patient actually appears to be doing much better in regard to the left chest wall ulcer which has been associated with a cancerous lesion. Nonetheless she did have some eschar on the surface of the wound region which when I gently cleanse the area pretty much came off completely and there were only two small areas it even shows evidence of an opening at this time. Overall she seems to be doing excellent which is great news. I'm not really sure what the status of her cancer is per se she is going back to see Dr. Grayland Ormond sometime soon in order to see whether or not another PET scan will be ordered. Electronic Signature(s) Signed: 06/01/2018 6:07:41 PM By: Worthy Keeler PA-C Entered By: Worthy Keeler on 06/01/2018 17:30:44 Christine [name] ([id number]) -------------------------------------------------------------------------------- Physical Exam Details Patient Name: Christine [name], Christine [name]. Date of Service: 06/01/2018 2:00 PM Medical Record Number: [id number] Patient Account Number: [id number] Date of Birth/Sex: January 13, 1946 (72 y.o. F) Treating RN: Montey Hora Primary Care Provider: Glendon Axe Other Clinician: Referring Provider: Glendon Axe Treating Provider/Extender: STONE III, Diana Armijo Weeks in Treatment: 72 Constitutional Well-nourished and well-hydrated in no acute distress. Respiratory normal breathing without difficulty. clear to auscultation bilaterally. Cardiovascular regular rate and rhythm with normal S1, S2. Psychiatric this patient is able to make decisions and demonstrates good insight into disease process. Alert and Oriented x 3. pleasant and cooperative. Notes Patient's  wound again currently show signs of almost complete epithelialization and appears to be doing excellent no debridement was performed today again due to the nature of this wound nor was it even necessary. I just mechanically clean the wound with saline and gauze. Electronic Signature(s) Signed: 06/01/2018 6:07:41 PM By: Worthy Keeler PA-C Entered By: Worthy Keeler on 06/01/2018 17:31:07 Christine [name] ([id number]) -------------------------------------------------------------------------------- Physician Orders Details Patient Name: Christine [name], Christine [name]. Date of Service: 06/01/2018 2:00 PM Medical Record Number: [id number] Patient Account Number: [id number] Date of Birth/Sex: March 06, 1946 (72 y.o. F) Treating RN: Montey Hora Primary Care Provider: Glendon Axe Other Clinician: Referring Provider: Glendon Axe Treating Provider/Extender: STONE III, Damante Spragg Weeks in Treatment: 13 Verbal / Phone Orders: No Diagnosis Coding ICD-10 Coding Code Description C50.912 Malignant neoplasm of unspecified site of left female breast L98.492 Non-pressure chronic ulcer of skin of other sites with fat layer exposed Z72.0 Tobacco use I10 Essential (primary) hypertension Wound Cleansing Wound #1 Left Breast o May Shower, gently pat wound dry prior to applying new dressing. Primary Wound Dressing Wound #1 Left Breast o Silver Alginate Secondary Dressing Wound #1 Left Breast o Gideon - or bandaid Dressing Change Frequency Wound #1 Left Breast o Change dressing every other day. Follow-up Appointments Wound #1 Left Breast o Return Appointment in 1 month Electronic Signature(s) Signed: 06/01/2018 5:03:08 PM By: Montey Hora Signed: 06/01/2018 6:07:41 PM By: Worthy Keeler PA-C Entered By: Montey Hora on 06/01/2018 15:26:37 Christine [name] ([id number]) -------------------------------------------------------------------------------- Problem List Details Patient Name: MALARY, AYLESWORTH. Date of Service: 06/01/2018 2:00 PM Medical Record Number: [id number] Patient Account Number: [id number] Date of Birth/Sex: 1945-06-24 (72 y.o. F) Treating RN: Montey Hora Primary Care Provider: Glendon Axe Other Clinician: Referring  Provider: Glendon Axe Treating Provider/Extender: STONE III, Tomeka Kantner Weeks in Treatment: 13 Active Problems ICD-10 Evaluated Encounter Code Description Active Date Today Diagnosis C50.912 Malignant neoplasm of unspecified site of left female breast 03/02/2018 No Yes L98.492 Non-pressure chronic ulcer of skin of other sites with fat layer 03/02/2018 No Yes exposed Z72.0 Tobacco use 03/02/2018 No Yes I10 Essential (primary) hypertension 03/02/2018 No Yes Inactive Problems Resolved Problems Electronic Signature(s) Signed: 06/01/2018 6:07:41 PM By: Worthy Keeler PA-C Entered By: Worthy Keeler on 06/01/2018 14:07:40 Christine [name] ([id number]) -------------------------------------------------------------------------------- Progress Note Details Patient Name: Christine [name]. Date of Service: 06/01/2018 2:00 PM Medical Record Number: [id number] Patient Account Number: [id number] Date of Birth/Sex: 01/27/1946 (72 y.o. F) Treating RN: Montey Hora Primary Care Provider: Glendon Axe Other Clinician: Referring Provider: Glendon Axe Treating Provider/Extender: STONE III, Kelsay Haggard Weeks in Treatment: 13 Subjective Chief Complaint Information obtained from Patient Left breast ulcer History of Present Illness (HPI) The following HPI elements were documented for the patient's wound: Associated Signs and Symptoms: Patient has a history of hypertension, breast cancer, she is a current tobacco user, and she does have depression. 03/02/18 on evaluation today patient is seen for initial evaluation here in our clinic concerning issues that she has been having with an ulcer over the left breast area involving the nipple which is a result of  Carcinoma. With that being said the patient appears to have received intermittent treatment due to discontinuing follow-up at times. This was initially back in 2013 and then subsequently on July 08, 2016 she had a pet scan suggesting the possibility of metastatic lesions in her long. They were too small to characterize however. She received one dose of chemotherapy July 30, 2016 again and then subsequently did not do follow-up for any additional follow-up in the interim. She is has a 10 according to records to undergo surgery or chemotherapy. With that being said she has had the wound on her breast foot seems to be quite a while possibly even as much as a year and a half having open January 2018. 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 She was referred to [place] by Dr. Grayland Ormond regarding the ulcer itself. Her current treatment plan was discussed to not necessarily be curative although it was the only thing she really wanted to pursue at this point. Nonetheless she does state that she understands all of this mentioned above. Nonetheless she's currently been using triple antibiotic ointment along with a Band-Aid over the wound area. She does have discomfort in fact she really didn't want anybody touching the area today due to health painful that was. Fortunately there's no evidence of overt infection at this point. No fevers chills noted 03/16/18 on evaluation today patient actually appears to be doing fairly well in regard to her left breast wound. She has been tolerating the dressing changes specifically the silver cell without complication. She states she's having much less pain and overall feels much better with this then she has with anything previous. The wound not staying so wet has seem to have been great benefit for her. In general the patient states that she's having no other major problems and complications at  this time which is good news. No fevers, chills, nausea, or vomiting noted at this time. 04/12/18 on evaluation today patient actually appears to be doing a little better in regard to her left breast ulcer. This actually appears to be for the  most part dry and a lot of the drain/eschar overlying the surface of what was all a wound initially appears to be doing much better. There is very little drainage that she has noted since I last saw her about three weeks ago. Fortunately there does not appear to be any evidence of infection at this time. She seems to be tolerating the treatment that Dr. Grayland Ormond has initiated at this point without complication. 06/01/18 on evaluation today patient actually appears to be doing much better in regard to the left chest wall ulcer which has been associated with a cancerous lesion. Nonetheless she did have some eschar on the surface of the wound region which when I gently cleanse the area pretty much came off completely and there were only two small areas it even shows evidence of an opening at this time. Overall she seems to be doing excellent which is great news. I'm not really sure what the status of her cancer is per se she is going back to see Dr. Grayland Ormond sometime soon in order to see whether or not another PET scan will be ordered. Patient History Information obtained from Patient. Christine [name], Christine [name] ([id number]) Family History Heart Disease - Father,Paternal Grandparents, Hypertension - Father,Child, Stroke - Mother, No family history of Cancer, Diabetes, Kidney Disease, Lung Disease, Seizures, Thyroid Problems, Tuberculosis. Social History Current every day smoker, Marital Status - Divorced, Alcohol Use - Never, Drug Use - No History, Caffeine Use - Daily. Medical And Surgical History Notes Constitutional Symptoms (General Health) Defibulator; Cancer Stage III; HTN; High Cholesterol, Oncologic Breast Cancer-Only 2 treatment of Chemo one in 2012; one in  2018 Review of Systems (ROS) Constitutional Symptoms (General Health) Denies complaints or symptoms of Fever, Chills. Respiratory The patient has no complaints or symptoms. Cardiovascular The patient has no complaints or symptoms. Psychiatric The patient has no complaints or symptoms. Objective Constitutional Well-nourished and well-hydrated in no acute distress. Vitals Time Taken: 2:14 PM, Height: 62 in, Weight: 88.1 lbs, BMI: 16.1, Temperature: 98.3 F, Pulse: 63 bpm, Respiratory Rate: 16 breaths/min, Blood Pressure: 160/86 mmHg. Respiratory normal breathing without difficulty. clear to auscultation bilaterally. Cardiovascular regular rate and rhythm with normal S1, S2. Psychiatric this patient is able to make decisions and demonstrates good insight into disease process. Alert and Oriented x 3. pleasant and cooperative. General Notes: Patient's wound again currently show signs of almost complete epithelialization and appears to be doing excellent no debridement was performed today again due to the nature of this wound nor was it even necessary. I just mechanically clean the wound with saline and gauze. Integumentary (Hair, Skin) Wound #1 status is Open. Original cause of wound was Gradually Appeared. The wound is located on the Left Breast. The wound measures 1cm length x 1cm width x 0.1cm depth; 0.785cm^2 area and 0.079cm^3 volume. There is Fat Christine [name], Christine [name]. ([id number]) (Subcutaneous Tissue) Exposed exposed. There is no tunneling or undermining noted. There is a medium amount of serosanguineous drainage noted. The wound margin is flat and intact. There is no granulation within the wound bed. There is a large (67-100%) amount of necrotic tissue within the wound bed including Eschar and Adherent Slough. The periwound skin appearance exhibited: Scarring. The periwound skin appearance did not exhibit: Callus, Crepitus, Excoriation, Induration, Rash, Dry/Scaly, Maceration,  Atrophie Blanche, Cyanosis, Ecchymosis, Hemosiderin Staining, Mottled, Pallor, Rubor, Erythema. The periwound has tenderness on palpation. Assessment Active Problems ICD-10 Malignant neoplasm of unspecified site of left female breast Non-pressure chronic ulcer of skin of other  sites with fat layer exposed Tobacco use Essential (primary) hypertension Plan Wound Cleansing: Wound #1 Left Breast: May Shower, gently pat wound dry prior to applying new dressing. Primary Wound Dressing: Wound #1 Left Breast: Silver Alginate Secondary Dressing: Wound #1 Left Breast: [place] - or bandaid Dressing Change Frequency: Wound #1 Left Breast: Change dressing every other day. Follow-up Appointments: Wound #1 Left Breast: Return Appointment in 1 month I'm gonna recommend that we continue with the above wound care measures for the next month. We are seeing on a one month follow-up cycle she's in agreement with this plan. Will see were things stand at that point. Please see above for specific wound care orders. We will see patient for re-evaluation in 1 month(s) here in the clinic. If anything worsens or changes patient will contact our office for additional recommendations. Electronic Signature(s) Signed: 06/01/2018 6:07:41 PM By: Worthy Keeler PA-C Entered By: Worthy Keeler on 06/01/2018 17:31:28 Christine [name], Christine [name] ([id number]) [address] [place] ([id number]) -------------------------------------------------------------------------------- ROS/PFSH Details Patient Name: Christine [name], Christine [name]. Date of Service: 06/01/2018 2:00 PM Medical Record Number: [id number] Patient Account Number: [id number] Date of Birth/Sex: Oct 31, 1945 (72 y.o. F) Treating RN: Montey Hora Primary Care Provider: Glendon Axe Other Clinician: Referring Provider: Glendon Axe Treating Provider/Extender: STONE III, Sloane Junkin Weeks in Treatment: 13 Information Obtained From Patient Wound History Do you currently have one or  more open woundso Yes How many open wounds do you currently haveo 1 Approximately how long have you had your woundso 1.8 months How have you been treating your wound(s) until nowo neosporin Has your wound(s) ever healed and then re-openedo No Have you had any lab work done in the past montho No Have you tested positive for an antibiotic resistant organism (MRSA, VRE)o No Constitutional Symptoms (General Health) Complaints and Symptoms: Negative for: Fever; Chills Medical History: Past Medical History Notes: Defibulator; Cancer Stage III; HTN; High Cholesterol, Eyes Medical History: Positive for: Cataracts Negative for: Glaucoma; Optic Neuritis Ear/Nose/Mouth/Throat Medical History: Negative for: Chronic sinus problems/congestion; Middle ear problems Hematologic/Lymphatic Medical History: Negative for: Anemia; Hemophilia; Human Immunodeficiency Virus; Lymphedema; Sickle Cell Disease Respiratory Complaints and Symptoms: No Complaints or Symptoms Medical History: Negative for: Aspiration; Asthma; Chronic Obstructive Pulmonary Disease (COPD); Pneumothorax; Sleep Apnea; Tuberculosis Cardiovascular Complaints and Symptoms: No Complaints or Symptoms Christine [name], Christine [name] ([id number]) Medical History: Positive for: Congestive Heart Failure; Coronary Artery Disease; Hypertension; Myocardial Infarction; Peripheral Arterial Disease Negative for: Angina; Arrhythmia; Deep Vein Thrombosis; Hypotension; Peripheral Venous Disease; Phlebitis; Vasculitis Gastrointestinal Medical History: Negative for: Cirrhosis ; Colitis; Crohnos; Hepatitis A; Hepatitis B Endocrine Medical History: Negative for: Type I Diabetes; Type II Diabetes Genitourinary Medical History: Negative for: End Stage Renal Disease Immunological Medical History: Negative for: Lupus Erythematosus; Raynaudos; Scleroderma Integumentary (Skin) Medical History: Negative for: History of Burn; History of pressure  wounds Musculoskeletal Medical History: Positive for: Osteoarthritis Negative for: Gout; Rheumatoid Arthritis; Osteomyelitis Neurologic Medical History: Negative for: Dementia; Neuropathy; Quadriplegia; Paraplegia; Seizure Disorder Oncologic Medical History: Positive for: Received Chemotherapy Past Medical History Notes: Breast Cancer-Only 2 treatment of Chemo one in 2012; one in 2018 Psychiatric Complaints and Symptoms: No Complaints or Symptoms Medical History: Negative for: Anorexia/bulimia; Confinement Anxiety HBO Extended History Items Christine [name], Christine [name] ([id number]) Eyes: Cataracts Immunizations Pneumococcal Vaccine: Received Pneumococcal Vaccination: Yes Implantable Devices Family and Social History Cancer: No; Diabetes: No; Heart Disease: Yes - Father,Paternal Grandparents; Hypertension: Yes - Father,Child; Kidney Disease: No; Lung Disease: No; Seizures: No; Stroke: Yes - Mother; Thyroid Problems: No; Tuberculosis: No; Current every  day smoker; Marital Status - Divorced; Alcohol Use: Never; Drug Use: No History; Caffeine Use: Daily; Advanced Directives: No; Patient does not want information on Advanced Directives; Living Will: No; Medical Power of Attorney: No Physician Affirmation I have reviewed and agree with the above information. Electronic Signature(s) Signed: 06/01/2018 6:07:41 PM By: Worthy Keeler PA-C Signed: 06/02/2018 5:14:34 PM By: Montey Hora Entered By: Worthy Keeler on 06/01/2018 17:30:57 Christine [name] ([id number]) -------------------------------------------------------------------------------- SuperBill Details Patient Name: Christine [name], Christine [name]. Date of Service: 06/01/2018 Medical Record Number: [id number] Patient Account Number: [id number] Date of Birth/Sex: [birth year] (72 y.o. F) Treating RN: Montey Hora Primary Care Provider: Glendon Axe Other Clinician: Referring Provider: Glendon Axe Treating Provider/Extender: STONE III, Eleonor Ocon Weeks  in Treatment: 13 Diagnosis Coding ICD-10 Codes Code Description C50.912 Malignant neoplasm of unspecified site of left female breast L98.492 Non-pressure chronic ulcer of skin of other sites with fat layer exposed Z72.0 Tobacco use I10 Essential (primary) hypertension Facility Procedures CPT4 Code: [id number] Description: [phone] - WOUND CARE VISIT-LEV 2 EST PT Modifier: Quantity: 1 Physician Procedures CPT4 Code: [id number] Description: 52074 - WC PHYS LEVEL 4 - EST PT ICD-10 Diagnosis Description C50.912 Malignant neoplasm of unspecified site of left female breast L98.492 Non-pressure chronic ulcer of skin of other sites with fat l Z72.0 Tobacco use I10 Essential (primary)  hypertension Modifier: ayer exposed Quantity: 1 Electronic Signature(s) Signed: 06/01/2018 6:07:41 PM By: Worthy Keeler PA-C Entered By: Worthy Keeler on 06/01/2018 17:31:40

## 2018-06-29 ENCOUNTER — Ambulatory Visit: Payer: Medicare Other | Admitting: Physician Assistant

## 2018-07-01 DIAGNOSIS — Z780 Asymptomatic menopausal state: Secondary | ICD-10-CM | POA: Insufficient documentation

## 2018-07-02 ENCOUNTER — Telehealth: Payer: Self-pay

## 2018-07-02 ENCOUNTER — Telehealth: Payer: Self-pay | Admitting: *Deleted

## 2018-07-02 NOTE — Telephone Encounter (Signed)
Patient cd reporting that her PCP has ordered a BMD to be done at [facility] on [address] and wanted Dr Grayland Ormond to know

## 2018-07-02 NOTE — Telephone Encounter (Signed)
Error

## 2018-07-02 NOTE — Telephone Encounter (Signed)
error 

## 2018-07-09 ENCOUNTER — Encounter: Payer: Medicare Other | Attending: Physician Assistant | Admitting: Physician Assistant

## 2018-07-09 DIAGNOSIS — Z09 Encounter for follow-up examination after completed treatment for conditions other than malignant neoplasm: Secondary | ICD-10-CM | POA: Insufficient documentation

## 2018-07-09 DIAGNOSIS — Z872 Personal history of diseases of the skin and subcutaneous tissue: Secondary | ICD-10-CM | POA: Insufficient documentation

## 2018-07-09 DIAGNOSIS — Z853 Personal history of malignant neoplasm of breast: Secondary | ICD-10-CM | POA: Insufficient documentation

## 2018-07-09 DIAGNOSIS — F17200 Nicotine dependence, unspecified, uncomplicated: Secondary | ICD-10-CM | POA: Diagnosis not present

## 2018-07-09 DIAGNOSIS — I10 Essential (primary) hypertension: Secondary | ICD-10-CM | POA: Insufficient documentation

## 2018-07-09 DIAGNOSIS — Z9221 Personal history of antineoplastic chemotherapy: Secondary | ICD-10-CM | POA: Diagnosis not present

## 2018-07-10 ENCOUNTER — Other Ambulatory Visit: Payer: Self-pay | Admitting: Oncology

## 2018-07-10 NOTE — Progress Notes (Signed)
Christine [name], Christine [name] ([id number]) Visit Report for 07/09/2018 Arrival Information Details Patient Name: Christine [name], Christine [name]. Date of Service: 07/09/2018 2:15 PM Medical Record Number: [id number] Patient Account Number: [id number] Date of Birth/Sex: 1945/09/22 (73 y.o. F) Treating RN: Cornell Barman Primary Care Mariadelosang Wynns: Glendon Axe Other Clinician: Referring Lasandra Batley: Glendon Axe Treating Dail Lerew/Extender: Melburn Hake, HOYT Weeks in Treatment: 18 Visit Information History Since Last Visit Added or deleted any medications: Yes Patient Arrived: Ambulatory Any new allergies or adverse reactions: No Arrival Time: 14:23 Had a fall or experienced change in No Accompanied By: self activities of daily living that may affect Transfer Assistance: None risk of falls: Patient Identification Verified: Yes Signs or symptoms of abuse/neglect since last visito No Secondary Verification Process Completed: Yes Hospitalized since last visit: No Implantable device outside of the clinic excluding No cellular tissue based products placed in the center since last visit: Has Dressing in Place as Prescribed: Yes Pain Present Now: No Electronic Signature(s) Signed: 07/09/2018 3:57:43 PM By: Lorine Bears RCP, RRT, CHT Entered By: Lorine Bears on 07/09/2018 14:24:31 Christine [name] ([id number]) -------------------------------------------------------------------------------- Clinic Level of Care Assessment Details Patient Name: Christine [name]. Date of Service: 07/09/2018 2:15 PM Medical Record Number: [id number] Patient Account Number: [id number] Date of Birth/Sex: June 26, 1945 (73 y.o. F) Treating RN: Cornell Barman Primary Care Kaysee Hergert: Glendon Axe Other Clinician: Referring Caleesi Kohl: Glendon Axe Treating Denice Cardon/Extender: Melburn Hake, HOYT Weeks in Treatment: 18 Clinic Level of Care Assessment Items TOOL 4 Quantity Score []  - Use when only an EandM is performed on FOLLOW-UP  visit 0 ASSESSMENTS - Nursing Assessment / Reassessment []  - Reassessment of Co-morbidities (includes updates in patient status) 0 X- 1 5 Reassessment of Adherence to Treatment Plan ASSESSMENTS - Wound and Skin Assessment / Reassessment X - Simple Wound Assessment / Reassessment - one wound 1 5 []  - 0 Complex Wound Assessment / Reassessment - multiple wounds []  - 0 Dermatologic / Skin Assessment (not related to wound area) ASSESSMENTS - Focused Assessment []  - Circumferential Edema Measurements - multi extremities 0 []  - 0 Nutritional Assessment / Counseling / Intervention []  - 0 Lower Extremity Assessment (monofilament, tuning fork, pulses) []  - 0 Peripheral Arterial Disease Assessment (using hand held doppler) ASSESSMENTS - Ostomy and/or Continence Assessment and Care []  - Incontinence Assessment and Management 0 []  - 0 Ostomy Care Assessment and Management (repouching, etc.) PROCESS - Coordination of Care X - Simple Patient / Family Education for ongoing care 1 15 []  - 0 Complex (extensive) Patient / Family Education for ongoing care []  - 0 Staff obtains Programmer, systems, Records, Test Results / Process Orders []  - 0 Staff telephones HHA, Nursing Homes / Clarify orders / etc []  - 0 Routine Transfer to another Facility (non-emergent condition) []  - 0 Routine Hospital Admission (non-emergent condition) []  - 0 New Admissions / Biomedical engineer / Ordering NPWT, Apligraf, etc. []  - 0 Emergency Hospital Admission (emergent condition) X- 1 10 Simple Discharge Coordination Christine [name], Christine [name]. ([id number]) []  - 0 Complex (extensive) Discharge Coordination PROCESS - Special Needs []  - Pediatric / Minor Patient Management 0 []  - 0 Isolation Patient Management []  - 0 Hearing / Language / Visual special needs []  - 0 Assessment of Community assistance (transportation, D/C planning, etc.) []  - 0 Additional assistance / Altered mentation []  - 0 Support Surface(s) Assessment (bed,  cushion, seat, etc.) INTERVENTIONS - Wound Cleansing / Measurement []  - Simple Wound Cleansing - one wound 0 []  - 0 Complex Wound Cleansing - multiple wounds X- 1  5 Wound Imaging (photographs - any number of wounds) []  - 0 Wound Tracing (instead of photographs) X- 1 5 Simple Wound Measurement - one wound []  - 0 Complex Wound Measurement - multiple wounds INTERVENTIONS - Wound Dressings []  - Small Wound Dressing one or multiple wounds 0 []  - 0 Medium Wound Dressing one or multiple wounds []  - 0 Large Wound Dressing one or multiple wounds []  - 0 Application of Medications - topical []  - 0 Application of Medications - injection INTERVENTIONS - Miscellaneous []  - External ear exam 0 []  - 0 Specimen Collection (cultures, biopsies, blood, body fluids, etc.) []  - 0 Specimen(s) / Culture(s) sent or taken to Lab for analysis []  - 0 Patient Transfer (multiple staff / Civil Service fast streamer / Similar devices) []  - 0 Simple Staple / Suture removal (25 or less) []  - 0 Complex Staple / Suture removal (26 or more) []  - 0 Hypo / Hyperglycemic Management (close monitor of Blood Glucose) []  - 0 Ankle / Brachial Index (ABI) - do not check if billed separately X- 1 5 Vital Signs Christine [name], Christine [name] ([id number]) Has the patient been seen at the hospital within the last three years: Yes Total Score: 50 Level Of Care: New/Established - Level 2 Electronic Signature(s) Signed: 07/09/2018 4:20:21 PM By: Gretta Cool, BSN, RN, CWS, Kim RN, BSN Entered By: Gretta Cool, BSN, RN, CWS, Kim on 07/09/2018 14:57:21 Christine [name] ([id number]) -------------------------------------------------------------------------------- Lower Extremity Assessment Details Patient Name: Christine [name], Christine [name]. Date of Service: 07/09/2018 2:15 PM Medical Record Number: [id number] Patient Account Number: [id number] Date of Birth/Sex: Dec 01, 1945 (73 y.o. F) Treating RN: Secundino Ginger Primary Care Mahati Vajda: Glendon Axe Other Clinician: Referring Aicia Babinski:  Glendon Axe Treating Kateryn Marasigan/Extender: Melburn Hake, HOYT Weeks in Treatment: 18 Electronic Signature(s) Signed: 07/09/2018 3:23:26 PM By: Secundino Ginger Entered By: Secundino Ginger on 07/09/2018 14:36:18 Christine [name] ([id number]) -------------------------------------------------------------------------------- Multi Wound Chart Details Patient Name: Christine [name]. Date of Service: 07/09/2018 2:15 PM Medical Record Number: [id number] Patient Account Number: [id number] Date of Birth/Sex: 02-23-1946 (73 y.o. F) Treating RN: Cornell Barman Primary Care Millissa Deese: Glendon Axe Other Clinician: Referring Londyn Hotard: Glendon Axe Treating Yaser Harvill/Extender: STONE III, HOYT Weeks in Treatment: 18 Vital Signs Height(in): 62 Pulse(bpm): 60 Weight(lbs): 88.1 Blood Pressure(mmHg): 138/62 Body Mass Index(BMI): 16 Temperature(F): 97.8 Respiratory Rate 16 (breaths/min): Photos: [1:No Photos] [N/A:N/A] Wound Location: [1:Left Breast] [N/A:N/A] Wounding Event: [1:Gradually Appeared] [N/A:N/A] Primary Etiology: [1:Malignant Wound] [N/A:N/A] Comorbid History: [1:Cataracts, Congestive Heart Failure, Coronary Artery Disease, Hypertension, Myocardial Infarction, Peripheral Arterial Disease, Osteoarthritis, Received Chemotherapy] [N/A:N/A] Date Acquired: [1:06/16/2016] [N/A:N/A] Weeks of Treatment: [1:18] [N/A:N/A] Wound Status: [1:Healed - Epithelialized] [N/A:N/A] Measurements L x W x D [1:0x0x0] [N/A:N/A] (cm) Area (cm) : [1:0] [N/A:N/A] Volume (cm) : [1:0] [N/A:N/A] % Reduction in Area: [1:100.00%] [N/A:N/A] % Reduction in Volume: [1:100.00%] [N/A:N/A] Classification: [1:Full Thickness Without Exposed Support Structures] [N/A:N/A] Exudate Amount: [1:None Present] [N/A:N/A] Wound Margin: [1:Flat and Intact] [N/A:N/A] Granulation Amount: [1:None Present (0%)] [N/A:N/A] Necrotic Amount: [1:Small (1-33%)] [N/A:N/A] Necrotic Tissue: [1:Eschar] [N/A:N/A] Exposed Structures: [1:Fat Layer (Subcutaneous  Tissue) Exposed: Yes Fascia: No Tendon: No Muscle: No Joint: No Bone: No] [N/A:N/A] Epithelialization: [1:None] [N/A:N/A] Periwound Skin Texture: [1:Scarring: Yes Excoriation: No] [N/A:N/A] Induration: No Callus: No Crepitus: No Rash: No Periwound Skin Moisture: Maceration: No N/A N/A Dry/Scaly: No Periwound Skin Color: Atrophie Blanche: No N/A N/A Cyanosis: No Ecchymosis: No Erythema: No Hemosiderin Staining: No Mottled: No Pallor: No Rubor: No Tenderness on Palpation: No N/A N/A Wound Preparation: Ulcer Cleansing: N/A N/A Rinsed/Irrigated with Saline Topical Anesthetic Applied:  None Treatment Notes Electronic Signature(s) Signed: 07/09/2018 4:20:21 PM By: Gretta Cool, BSN, RN, CWS, Kim RN, BSN Entered By: Gretta Cool, BSN, RN, CWS, Kim on 07/09/2018 14:55:58 Christine [name] ([id number]) -------------------------------------------------------------------------------- [place] Details Patient Name: Christine [name], Christine [name]. Date of Service: 07/09/2018 2:15 PM Medical Record Number: [id number] Patient Account Number: [id number] Date of Birth/Sex: Apr 24, 1946 (73 y.o. F) Treating RN: Cornell Barman Primary Care Rogena Deupree: Glendon Axe Other Clinician: Referring Johnathen Testa: Glendon Axe Treating Ezrael Sam/Extender: Melburn Hake, HOYT Weeks in Treatment: 18 Active Inactive Electronic Signature(s) Signed: 07/09/2018 4:20:21 PM By: Gretta Cool, BSN, RN, CWS, Kim RN, BSN Entered By: Gretta Cool, BSN, RN, CWS, Kim on 07/09/2018 14:55:44 Christine [name] ([id number]) -------------------------------------------------------------------------------- Pain Assessment Details Patient Name: Christine [name], Christine [name]. Date of Service: 07/09/2018 2:15 PM Medical Record Number: [id number] Patient Account Number: [id number] Date of Birth/Sex: [birth year] (73 y.o. F) Treating RN: Cornell Barman Primary Care Iasha Mccalister: Glendon Axe Other Clinician: Referring Marysue Fait: Glendon Axe Treating Goldy Calandra/Extender: STONE III,  HOYT Weeks in Treatment: 18 Active Problems Location of Pain Severity and Description of Pain Patient Has Paino No Site Locations Pain Management and Medication Current Pain Management: Electronic Signature(s) Signed: 07/09/2018 3:57:43 PM By: Lorine Bears RCP, RRT, CHT Signed: 07/09/2018 4:20:21 PM By: Gretta Cool, BSN, RN, CWS, Kim RN, BSN Entered By: Lorine Bears on 07/09/2018 14:24:40 Christine [name] ([id number]) -------------------------------------------------------------------------------- Patient/Caregiver Education Details Patient Name: Christine [name], Christine [name]. Date of Service: 07/09/2018 2:15 PM Medical Record Number: [id number] Patient Account Number: [id number] Date of Birth/Gender: [birth year] (73 y.o. F) Treating RN: Cornell Barman Primary Care Physician: Glendon Axe Other Clinician: Referring Physician: Glendon Axe Treating Physician/Extender: Sharalyn Ink in Treatment: 18 Education Assessment Education Provided To: Patient Education Topics Provided Basic Hygiene: Wound/Skin Impairment: Handouts: Other: protect and wash with antibacterial soap Methods: Explain/Verbal Responses: State content correctly Electronic Signature(s) Signed: 07/09/2018 4:20:21 PM By: Gretta Cool, BSN, RN, CWS, Kim RN, BSN Entered By: Gretta Cool, BSN, RN, CWS, Kim on 07/09/2018 14:58:15 Christine [name] ([id number]) -------------------------------------------------------------------------------- Wound Assessment Details Patient Name: Christine [name], Christine [name]. Date of Service: 07/09/2018 2:15 PM Medical Record Number: [id number] Patient Account Number: [id number] Date of Birth/Sex: Dec 30, 1945 (73 y.o. F) Treating RN: Cornell Barman Primary Care Hugh Garrow: Glendon Axe Other Clinician: Referring Laksh Hinners: Glendon Axe Treating Elhadj Girton/Extender: STONE III, HOYT Weeks in Treatment: 18 Wound Status Wound Number: 1 Primary Malignant Wound Etiology: Wound Location: Left Breast Wound  Healed - Epithelialized Wounding Event: Gradually Appeared Status: Date Acquired: 06/16/2016 Comorbid Cataracts, Congestive Heart Failure, Coronary Weeks Of Treatment: 18 History: Artery Disease, Hypertension, Myocardial Clustered Wound: No Infarction, Peripheral Arterial Disease, Osteoarthritis, Received Chemotherapy Photos Photo Uploaded By: Secundino Ginger on 07/09/2018 15:20:28 Wound Measurements Length: (cm) 0 % Reduc Width: (cm) 0 % Reduc Depth: (cm) 0 Epithel Area: (cm) 0 Tunnel Volume: (cm) 0 Underm tion in Area: 100% tion in Volume: 100% ialization: None ing: No ining: No Wound Description Full Thickness Without Exposed Support Classification: Structures Wound Margin: Flat and Intact Exudate None Present Amount: Foul Odor After Cleansing: No Slough/Fibrino Yes Wound Bed Granulation Amount: None Present (0%) Exposed Structure Necrotic Amount: Small (1-33%) Fascia Exposed: No Necrotic Quality: Eschar Fat Layer (Subcutaneous Tissue) Exposed: Yes Tendon Exposed: No Muscle Exposed: No Joint Exposed: No Bone Exposed: No Christine [name], Christine [name]. ([id number]) Periwound Skin Texture Texture Color No Abnormalities Noted: No No Abnormalities Noted: No Callus: No Atrophie Blanche: No Crepitus: No Cyanosis: No Excoriation: No Ecchymosis: No Induration: No Erythema: No Rash: No Hemosiderin Staining: No Scarring: Yes Mottled: No Pallor: No Moisture  Rubor: No No Abnormalities Noted: No Dry / Scaly: No Maceration: No Wound Preparation Ulcer Cleansing: Rinsed/Irrigated with Saline Topical Anesthetic Applied: None Electronic Signature(s) Signed: 07/09/2018 4:20:21 PM By: Gretta Cool, BSN, RN, CWS, Kim RN, BSN Entered By: Gretta Cool, BSN, RN, CWS, Kim on 07/09/2018 14:55:24 Christine [name] ([id number]) -------------------------------------------------------------------------------- [place] Details Patient Name: Christine [name]. Date of Service: 07/09/2018 2:15 PM Medical Record  Number: [id number] Patient Account Number: [id number] Date of Birth/Sex: 1945/08/08 (73 y.o. F) Treating RN: Cornell Barman Primary Care Wyatt Galvan: Glendon Axe Other Clinician: Referring Emory Gallentine: Glendon Axe Treating Koralee Wedeking/Extender: STONE III, HOYT Weeks in Treatment: 18 Vital Signs Time Taken: 14:25 Temperature (F): 97.8 Height (in): 62 Pulse (bpm): 64 Weight (lbs): 88.1 Respiratory Rate (breaths/min): 16 Body Mass Index (BMI): 16.1 Blood Pressure (mmHg): 138/62 Reference Range: 80 - 120 mg / dl Airway Electronic Signature(s) Signed: 07/09/2018 3:57:43 PM By: Lorine Bears RCP, RRT, CHT Entered By: Lorine Bears on 07/09/2018 14:28:52

## 2018-07-13 NOTE — Progress Notes (Signed)
Christine [name], Christine [name] ([id number]) Visit Report for 07/09/2018 Chief Complaint Document Details Patient Name: Christine [name], Christine [name]. Date of Service: 07/09/2018 2:15 PM Medical Record Number: [id number] Patient Account Number: [id number] Date of Birth/Sex: 1945/09/14 (73 y.o. F) Treating RN: Cornell Barman Primary Care Provider: Glendon Axe Other Clinician: Referring Provider: Glendon Axe Treating Provider/Extender: Melburn Hake, HOYT Weeks in Treatment: 18 Information Obtained from: Patient Chief Complaint Left breast ulcer Electronic Signature(s) Signed: 07/09/2018 5:23:14 PM By: Worthy Keeler PA-C Entered By: Worthy Keeler on 07/09/2018 16:24:28 Christine [name] ([id number]) -------------------------------------------------------------------------------- HPI Details Patient Name: Christine [name]. Date of Service: 07/09/2018 2:15 PM Medical Record Number: [id number] Patient Account Number: [id number] Date of Birth/Sex: 10/22/1945 (73 y.o. F) Treating RN: Cornell Barman Primary Care Provider: Glendon Axe Other Clinician: Referring Provider: Glendon Axe Treating Provider/Extender: STONE III, HOYT Weeks in Treatment: 18 History of Present Illness Associated Signs and Symptoms: Patient has a history of hypertension, breast cancer, she is a current tobacco user, and she does have depression. HPI Description: 03/02/18 on evaluation today patient is seen for initial evaluation here in our clinic concerning issues that she has been having with an ulcer over the left breast area involving the nipple which is a result of Carcinoma. With that being said the patient appears to have received intermittent treatment due to discontinuing follow-up at times. This was initially back in 2013 and then subsequently on July 08, 2016 she had a pet scan suggesting the possibility of metastatic lesions in her long. They were too small to characterize however. She received one dose of chemotherapy July 30, 2016  again and then subsequently did not do follow-up for any additional follow-up in the interim. She is has a 10 according to records to undergo surgery or chemotherapy. With that being said she has had the wound on her breast foot seems to be quite a while possibly even as much as a year and a half having open January 2018. 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 She was referred to [place] by Dr. Grayland Ormond regarding the ulcer itself. Her current treatment plan was discussed to not necessarily be curative although it was the only thing she really wanted to pursue at this point. Nonetheless she does state that she understands all of this mentioned above. Nonetheless she's currently been using triple antibiotic ointment along with a Band-Aid over the wound area. She does have discomfort in fact she really didn't want anybody touching the area today due to health painful that was. Fortunately there's no evidence of overt infection at this point. No fevers chills noted 03/16/18 on evaluation today patient actually appears to be doing fairly well in regard to her left breast wound. She has been tolerating the dressing changes specifically the silver cell without complication. She states she's having much less pain and overall feels much better with this then she has with anything previous. The wound not staying so wet has seem to have been great benefit for her. In general the patient states that she's having no other major problems and complications at this time which is good news. No fevers, chills, nausea, or vomiting noted at this time. 04/12/18 on evaluation today patient actually appears to be doing a little better in regard to her left breast ulcer. This actually appears to be for the most part dry and a lot of the drain/eschar overlying the surface of what was all a wound initially  appears to be doing much better. There is very  little drainage that she has noted since I last saw her about three weeks ago. Fortunately there does not appear to be any evidence of infection at this time. She seems to be tolerating the treatment that Dr. Grayland Ormond has initiated at this point without complication. 06/01/18 on evaluation today patient actually appears to be doing much better in regard to the left chest wall ulcer which has been associated with a cancerous lesion. Nonetheless she did have some eschar on the surface of the wound region which when I gently cleanse the area pretty much came off completely and there were only two small areas it even shows evidence of an opening at this time. Overall she seems to be doing excellent which is great news. I'm not really sure what the status of her cancer is per se she is going back to see Dr. Grayland Ormond sometime soon in order to see whether or not another PET scan will be ordered. 07/09/18 on evaluation today patient presents for follow-up concerning her chest wall ulcer which again was a cancerous lesion in nature. Fortunately this appears to have completely healed and has done excellent since I first began seeing her. There is no signs of an opening at this point. Electronic Signature(s) Signed: 07/09/2018 5:23:14 PM By: Worthy Keeler PA-C Entered By: Worthy Keeler on 07/09/2018 16:24:34 Christine [name] ([id number]) -------------------------------------------------------------------------------- Physical Exam Details Patient Name: Christine [name], Christine [name]. Date of Service: 07/09/2018 2:15 PM Medical Record Number: [id number] Patient Account Number: [id number] Date of Birth/Sex: Dec 28, 1945 (73 y.o. F) Treating RN: Cornell Barman Primary Care Provider: Glendon Axe Other Clinician: Referring Provider: Glendon Axe Treating Provider/Extender: STONE III, HOYT Weeks in Treatment: 57 Constitutional Well-nourished and well-hydrated in no acute distress. Respiratory normal breathing without  difficulty. Psychiatric this patient is able to make decisions and demonstrates good insight into disease process. Alert and Oriented x 3. pleasant and cooperative. Notes Currently patient's wound bed again shows signs of complete epithelialization there does not appear to be evidence of opening at this time this is excellent news. Electronic Signature(s) Signed: 07/09/2018 5:23:14 PM By: Worthy Keeler PA-C Entered By: Worthy Keeler on 07/09/2018 16:30:04 Christine [name] ([id number]) -------------------------------------------------------------------------------- Physician Orders Details Patient Name: Christine [name], Christine [name]. Date of Service: 07/09/2018 2:15 PM Medical Record Number: [id number] Patient Account Number: [id number] Date of Birth/Sex: 1946-05-15 (73 y.o. F) Treating RN: Cornell Barman Primary Care Provider: Glendon Axe Other Clinician: Referring Provider: Glendon Axe Treating Provider/Extender: STONE III, HOYT Weeks in Treatment: 18 Verbal / Phone Orders: No Diagnosis Coding Wound Cleansing o Other: - Dial antibacterial soap Discharge From [place] o Discharge from [place] - treatment complete Electronic Signature(s) Signed: 07/09/2018 4:20:21 PM By: Gretta Cool, BSN, RN, CWS, Kim RN, BSN Signed: 07/09/2018 5:23:14 PM By: Worthy Keeler PA-C Entered By: Gretta Cool, BSN, RN, CWS, Kim on 07/09/2018 14:56:53 Christine [name] ([id number]) -------------------------------------------------------------------------------- Problem List Details Patient Name: Christine [name], Christine [name]. Date of Service: 07/09/2018 2:15 PM Medical Record Number: [id number] Patient Account Number: [id number] Date of Birth/Sex: 12-17-1945 (73 y.o. F) Treating RN: Cornell Barman Primary Care Provider: Glendon Axe Other Clinician: Referring Provider: Glendon Axe Treating Provider/Extender: Melburn Hake, HOYT Weeks in Treatment: 18 Active Problems ICD-10 Evaluated Encounter Code Description Active  Date Today Diagnosis C50.912 Malignant neoplasm of unspecified site of left female breast 03/02/2018 No Yes L98.492 Non-pressure chronic ulcer of skin of other sites with fat layer 03/02/2018 No Yes exposed Z72.0  Tobacco use 03/02/2018 No Yes I10 Essential (primary) hypertension 03/02/2018 No Yes Inactive Problems Resolved Problems Electronic Signature(s) Signed: 07/09/2018 5:23:14 PM By: Worthy Keeler PA-C Entered By: Worthy Keeler on 07/09/2018 16:24:08 Christine [name] ([id number]) -------------------------------------------------------------------------------- Progress Note Details Patient Name: Christine [name], Christine [name]. Date of Service: 07/09/2018 2:15 PM Medical Record Number: [id number] Patient Account Number: [id number] Date of Birth/Sex: Oct 13, 1945 (73 y.o. F) Treating RN: Cornell Barman Primary Care Provider: Glendon Axe Other Clinician: Referring Provider: Glendon Axe Treating Provider/Extender: Melburn Hake, HOYT Weeks in Treatment: 18 Subjective Chief Complaint Information obtained from Patient Left breast ulcer History of Present Illness (HPI) The following HPI elements were documented for the patient's wound: Associated Signs and Symptoms: Patient has a history of hypertension, breast cancer, she is a current tobacco user, and she does have depression. 03/02/18 on evaluation today patient is seen for initial evaluation here in our clinic concerning issues that she has been having with an ulcer over the left breast area involving the nipple which is a result of Carcinoma. With that being said the patient appears to have received intermittent treatment due to discontinuing follow-up at times. This was initially back in 2013 and then subsequently on July 08, 2016 she had a pet scan suggesting the possibility of metastatic lesions in her long. They were too small to characterize however. She received one dose of chemotherapy July 30, 2016 again and then subsequently did not do  follow-up for any additional follow-up in the interim. She is has a 10 according to records to undergo surgery or chemotherapy. With that being said she has had the wound on her breast foot seems to be quite a while possibly even as much as a year and a half having open January 2018. 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 She was referred to [place] by Dr. Grayland Ormond regarding the ulcer itself. Her current treatment plan was discussed to not necessarily be curative although it was the only thing she really wanted to pursue at this point. Nonetheless she does state that she understands all of this mentioned above. Nonetheless she's currently been using triple antibiotic ointment along with a Band-Aid over the wound area. She does have discomfort in fact she really didn't want anybody touching the area today due to health painful that was. Fortunately there's no evidence of overt infection at this point. No fevers chills noted 03/16/18 on evaluation today patient actually appears to be doing fairly well in regard to her left breast wound. She has been tolerating the dressing changes specifically the silver cell without complication. She states she's having much less pain and overall feels much better with this then she has with anything previous. The wound not staying so wet has seem to have been great benefit for her. In general the patient states that she's having no other major problems and complications at this time which is good news. No fevers, chills, nausea, or vomiting noted at this time. 04/12/18 on evaluation today patient actually appears to be doing a little better in regard to her left breast ulcer. This actually appears to be for the most part dry and a lot of the drain/eschar overlying the surface of what was all a wound initially appears to be doing much better. There is very little drainage that she has noted since  I last saw her about three weeks ago. Fortunately there does not appear to be any evidence of  infection at this time. She seems to be tolerating the treatment that Dr. Grayland Ormond has initiated at this point without complication. 06/01/18 on evaluation today patient actually appears to be doing much better in regard to the left chest wall ulcer which has been associated with a cancerous lesion. Nonetheless she did have some eschar on the surface of the wound region which when I gently cleanse the area pretty much came off completely and there were only two small areas it even shows evidence of an opening at this time. Overall she seems to be doing excellent which is great news. I'm not really sure what the status of her cancer is per se she is going back to see Dr. Grayland Ormond sometime soon in order to see whether or not another PET scan will be ordered. 07/09/18 on evaluation today patient presents for follow-up concerning her chest wall ulcer which again was a cancerous lesion in nature. Fortunately this appears to have completely healed and has done excellent since I first began seeing her. There is no signs of an opening at this point. Christine [name], Christine [name] ([id number]) Patient History Information obtained from Patient. Family History Heart Disease - Father,Paternal Grandparents, Hypertension - Father,Child, Stroke - Mother, No family history of Cancer, Diabetes, Kidney Disease, Lung Disease, Seizures, Thyroid Problems, Tuberculosis. Social History Current every day smoker, Marital Status - Divorced, Alcohol Use - Never, Drug Use - No History, Caffeine Use - Daily. Medical And Surgical History Notes Constitutional Symptoms (General Health) Defibulator; Cancer Stage III; HTN; High Cholesterol, Oncologic Breast Cancer-Only 2 treatment of Chemo one in 2012; one in 2018 Review of Systems (ROS) Constitutional Symptoms (General Health) Denies complaints or symptoms of Fever, Chills. Respiratory The  patient has no complaints or symptoms. Cardiovascular The patient has no complaints or symptoms. Psychiatric The patient has no complaints or symptoms. Objective Constitutional Well-nourished and well-hydrated in no acute distress. Vitals Time Taken: 2:25 PM, Height: 62 in, Weight: 88.1 lbs, BMI: 16.1, Temperature: 97.8 F, Pulse: 64 bpm, Respiratory Rate: 16 breaths/min, Blood Pressure: 138/62 mmHg. Respiratory normal breathing without difficulty. Psychiatric this patient is able to make decisions and demonstrates good insight into disease process. Alert and Oriented x 3. pleasant and cooperative. General Notes: Currently patient's wound bed again shows signs of complete epithelialization there does not appear to be evidence of opening at this time this is excellent news. Integumentary (Hair, Skin) Wound #1 status is Healed - Epithelialized. Original cause of wound was Gradually Appeared. The wound is located on the Left Breast. The wound measures 0cm length x 0cm width x 0cm depth; 0cm^2 area and 0cm^3 volume. There is Fat Christine [name], Christine [name]. ([id number]) (Subcutaneous Tissue) Exposed exposed. There is no tunneling or undermining noted. There is a none present amount of drainage noted. The wound margin is flat and intact. There is no granulation within the wound bed. There is a small (1-33%) amount of necrotic tissue within the wound bed including Eschar. The periwound skin appearance exhibited: Scarring. The periwound skin appearance did not exhibit: Callus, Crepitus, Excoriation, Induration, Rash, Dry/Scaly, Maceration, Atrophie Blanche, Cyanosis, Ecchymosis, Hemosiderin Staining, Mottled, Pallor, Rubor, Erythema. Assessment Active Problems ICD-10 Malignant neoplasm of unspecified site of left female breast Non-pressure chronic ulcer of skin of other sites with fat layer exposed Tobacco use Essential (primary) hypertension Plan Wound Cleansing: Other: - Dial antibacterial  soap Discharge From [facility] Services: Discharge from [place] - treatment complete My suggestion at this time is gonna be that we discontinue wound care  follow-up as she seems to be doing so well the patient is in agreement that plan. We will subsequently see were things stand at follow-up. If anything changes or worsens meantime she will contact the office and let me know. Electronic Signature(s) Signed: 07/09/2018 5:23:14 PM By: Worthy Keeler PA-C Entered By: Worthy Keeler on 07/09/2018 16:30:39 Christine [name] ([id number]) -------------------------------------------------------------------------------- ROS/PFSH Details Patient Name: Christine [name], Christine [name]. Date of Service: 07/09/2018 2:15 PM Medical Record Number: [id number] Patient Account Number: [id number] Date of Birth/Sex: 1946/01/25 (73 y.o. F) Treating RN: Cornell Barman Primary Care Provider: Glendon Axe Other Clinician: Referring Provider: Glendon Axe Treating Provider/Extender: STONE III, HOYT Weeks in Treatment: 18 Information Obtained From Patient Wound History Do you currently have one or more open woundso Yes How many open wounds do you currently haveo 1 Approximately how long have you had your woundso 1.8 months How have you been treating your wound(s) until nowo neosporin Has your wound(s) ever healed and then re-openedo No Have you had any lab work done in the past montho No Have you tested positive for an antibiotic resistant organism (MRSA, VRE)o No Constitutional Symptoms (General Health) Complaints and Symptoms: Negative for: Fever; Chills Medical History: Past Medical History Notes: Defibulator; Cancer Stage III; HTN; High Cholesterol, Eyes Medical History: Positive for: Cataracts Negative for: Glaucoma; Optic Neuritis Ear/Nose/Mouth/Throat Medical History: Negative for: Chronic sinus problems/congestion; Middle ear problems Hematologic/Lymphatic Medical History: Negative for: Anemia; Hemophilia;  Human Immunodeficiency Virus; Lymphedema; Sickle Cell Disease Respiratory Complaints and Symptoms: No Complaints or Symptoms Medical History: Negative for: Aspiration; Asthma; Chronic Obstructive Pulmonary Disease (COPD); Pneumothorax; Sleep Apnea; Tuberculosis Cardiovascular Complaints and Symptoms: No Complaints or Symptoms Christine [name], Christine [name] ([id number]) Medical History: Positive for: Congestive Heart Failure; Coronary Artery Disease; Hypertension; Myocardial Infarction; Peripheral Arterial Disease Negative for: Angina; Arrhythmia; Deep Vein Thrombosis; Hypotension; Peripheral Venous Disease; Phlebitis; Vasculitis Gastrointestinal Medical History: Negative for: Cirrhosis ; Colitis; Crohnos; Hepatitis A; Hepatitis B Endocrine Medical History: Negative for: Type I Diabetes; Type II Diabetes Genitourinary Medical History: Negative for: End Stage Renal Disease Immunological Medical History: Negative for: Lupus Erythematosus; Raynaudos; Scleroderma Integumentary (Skin) Medical History: Negative for: History of Burn; History of pressure wounds Musculoskeletal Medical History: Positive for: Osteoarthritis Negative for: Gout; Rheumatoid Arthritis; Osteomyelitis Neurologic Medical History: Negative for: Dementia; Neuropathy; Quadriplegia; Paraplegia; Seizure Disorder Oncologic Medical History: Positive for: Received Chemotherapy Past Medical History Notes: Breast Cancer-Only 2 treatment of Chemo one in 2012; one in 2018 Psychiatric Complaints and Symptoms: No Complaints or Symptoms Medical History: Negative for: Anorexia/bulimia; Confinement Anxiety HBO Extended History Items Christine [name], Christine [name] ([id number]) Eyes: Cataracts Immunizations Pneumococcal Vaccine: Received Pneumococcal Vaccination: Yes Implantable Devices Family and Social History Cancer: No; Diabetes: No; Heart Disease: Yes - Father,Paternal Grandparents; Hypertension: Yes - Father,Child; Kidney Disease: No;  Lung Disease: No; Seizures: No; Stroke: Yes - Mother; Thyroid Problems: No; Tuberculosis: No; Current every day smoker; Marital Status - Divorced; Alcohol Use: Never; Drug Use: No History; Caffeine Use: Daily; Advanced Directives: No; Patient does not want information on Advanced Directives; Living Will: No; Medical Power of Attorney: No Physician Affirmation I have reviewed and agree with the above information. Electronic Signature(s) Signed: 07/09/2018 5:23:14 PM By: Worthy Keeler PA-C Signed: 07/12/2018 4:45:46 PM By: Gretta Cool, BSN, RN, CWS, Kim RN, BSN Entered By: Worthy Keeler on 07/09/2018 16:29:44 Christine [name] ([id number]) -------------------------------------------------------------------------------- SuperBill Details Patient Name: Christine [name], Christine [name]. Date of Service: 07/09/2018 Medical Record Number: [id number] Patient Account Number: [id number] Date of Birth/Sex: [birth year] (73 y.o. F)  Treating RN: Cornell Barman Primary Care Provider: Glendon Axe Other Clinician: Referring Provider: Glendon Axe Treating Provider/Extender: STONE III, HOYT Weeks in Treatment: 18 Diagnosis Coding ICD-10 Codes Code Description C50.912 Malignant neoplasm of unspecified site of left female breast L98.492 Non-pressure chronic ulcer of skin of other sites with fat layer exposed Z72.0 Tobacco use I10 Essential (primary) hypertension Facility Procedures CPT4 Code: [id number] Description: [phone] - WOUND CARE VISIT-LEV 2 EST PT Modifier: Quantity: 1 Physician Procedures CPT4 Code: [id number] Description: 05697 - WC PHYS LEVEL 2 - EST PT ICD-10 Diagnosis Description C50.912 Malignant neoplasm of unspecified site of left female breast L98.492 Non-pressure chronic ulcer of skin of other sites with fat l Z72.0 Tobacco use I10 Essential (primary)  hypertension Modifier: ayer exposed Quantity: 1 Electronic Signature(s) Signed: 07/09/2018 5:23:14 PM By: Worthy Keeler PA-C Entered By: Worthy Keeler on  07/09/2018 16:37:23

## 2018-08-30 ENCOUNTER — Telehealth: Payer: Self-pay | Admitting: *Deleted

## 2018-08-30 MED ORDER — TAMOXIFEN CITRATE 20 MG PO TABS: 20.0000 mg | ORAL_TABLET | Freq: Every day | ORAL | 0 refills | Status: DC

## 2018-08-30 NOTE — Telephone Encounter (Signed)
Patient called for 90 day prescription to be sent to [facility] and I explained to her that I could only give her a 30 day supply until she comes in for an appointment as she did not show up for her last appointment and has no future appointment scheduled. She agreed to schedule an appointment for 4/13 and I sent 30 day prescription to [facility] for her

## 2018-09-20 ENCOUNTER — Other Ambulatory Visit: Payer: Self-pay | Admitting: *Deleted

## 2018-09-20 MED ORDER — TAMOXIFEN CITRATE 20 MG PO TABS: 20.0000 mg | ORAL_TABLET | Freq: Every day | ORAL | 0 refills | Status: DC

## 2018-09-20 NOTE — Telephone Encounter (Signed)
30 day supply sent in.  Patient need a web-ex or televisit prior to any further refills.

## 2018-09-27 ENCOUNTER — Ambulatory Visit: Payer: Medicare Other | Admitting: Oncology

## 2018-12-15 ENCOUNTER — Telehealth: Payer: Self-pay | Admitting: *Deleted

## 2018-12-15 NOTE — Telephone Encounter (Signed)
Ok.  Thank you. Will await her call to schedule an appointment.

## 2018-12-15 NOTE — Telephone Encounter (Signed)
Pt needs at minimal a video visit.  In person would be acceptable too.

## 2018-12-15 NOTE — Telephone Encounter (Signed)
Patient called stating that Dr Grayland Ormond spoke with patient NCM from her insurance company and was told he was sending in her cancer medicine to [place]. But they do not have it and she can not reach them. She has No Showed and cancelled several appts and has no follow up appts scheduled for future. Please advise

## 2018-12-15 NOTE — Telephone Encounter (Signed)
I called and spoke with patient and explained that she needs an appointment before he can order medication for her. She states she is willing to see Dr Grayland Ormond in person, but just not right now as she is dealing with the loss of a family member. She refused a video visit stating she does not have a computer. She states she will call back to schedule an appointment

## 2018-12-24 ENCOUNTER — Other Ambulatory Visit: Payer: Self-pay

## 2018-12-24 ENCOUNTER — Inpatient Hospital Stay: Payer: Medicare Other | Attending: Oncology | Admitting: Oncology

## 2018-12-24 ENCOUNTER — Encounter: Payer: Self-pay | Admitting: Oncology

## 2018-12-24 VITALS — BP 97/63 | HR 73 | Temp 97.5°F | Wt 87.0 lb

## 2018-12-24 DIAGNOSIS — Z9119 Patient's noncompliance with other medical treatment and regimen: Secondary | ICD-10-CM | POA: Insufficient documentation

## 2018-12-24 DIAGNOSIS — Z170 Estrogen receptor positive status [ER+]: Secondary | ICD-10-CM | POA: Diagnosis not present

## 2018-12-24 DIAGNOSIS — C50211 Malignant neoplasm of upper-inner quadrant of right female breast: Secondary | ICD-10-CM | POA: Diagnosis present

## 2018-12-24 DIAGNOSIS — Z720 Tobacco use: Secondary | ICD-10-CM | POA: Insufficient documentation

## 2018-12-24 DIAGNOSIS — R911 Solitary pulmonary nodule: Secondary | ICD-10-CM | POA: Insufficient documentation

## 2018-12-24 MED ORDER — TAMOXIFEN CITRATE 20 MG PO TABS: 20.0000 mg | ORAL_TABLET | Freq: Every day | ORAL | 3 refills | Status: DC

## 2018-12-24 NOTE — Progress Notes (Signed)
Patient stated that she had been doing well with no complaints. Patient stated that she had not taken her Tamoxifen for 3 months since she ran out.

## 2018-12-26 NOTE — Progress Notes (Signed)
[place]  [phone] [phone]  ID: Christine [name] OB: [birth year]  MR#: [id number]  XFG#:[id number]  Patient Care Team: Glendon Axe, MD as PCP - General (Internal Medicine)  CHIEF COMPLAINT: Clinical stage IIIB ER/PR positive, HER-2 negative invasive carcinoma of the upper inner quadrant of the left breast.  INTERVAL HISTORY: Patient returns to clinic today for routine evaluation.  She has been noncompliant with follow-up and has not been seen since October 2019.  She also ran out of her tamoxifen several months ago.  She currently feels well and is asymptomatic.  Her left breast wound is now completely resolved.  She has no neurologic complaints. She denies any recent fevers or illnesses. She has a good appetite and denies weight loss.  She denies any chest pain, hemoptysis, cough, or shortness of breath.  She has no nausea, vomiting, constipation, or diarrhea. She has no urinary complaints.  Patient offers no specific complaints today.  REVIEW OF SYSTEMS:   Review of Systems  Constitutional: Negative.  Negative for fever, malaise/fatigue and weight loss.  Respiratory: Negative.  Negative for cough and shortness of breath.   Cardiovascular: Negative.  Negative for chest pain and leg swelling.  Gastrointestinal: Negative.  Negative for abdominal pain.  Genitourinary: Negative.  Negative for dysuria.  Musculoskeletal: Negative.  Negative for back pain.  Skin: Negative.  Negative for rash.  Neurological: Negative.  Negative for dizziness, focal weakness, weakness and headaches.  Psychiatric/Behavioral: The patient is nervous/anxious.     As per HPI. Otherwise, a complete review of systems is negative.  PAST MEDICAL HISTORY: Past Medical History:  Diagnosis Date  . Allergic rhinitis   . Anxiety   . Breast cancer ([place])   . Chronic systolic heart failure ([place])   . Coronary artery disease   . GERD (gastroesophageal reflux disease)   .  Heart murmur   . Hyperlipidemia   . Hypertension   . Implantable defibrillator St Jude    DOI 2006  . MI (myocardial infarction) ([place])   . Osteoarthritis     PAST SURGICAL HISTORY: Past Surgical History:  Procedure Laterality Date  . CHF exacerbation  10/07  . CORONARY STENT PLACEMENT  8/06   3 stents; myocardial infarction  . NM MYOVIEW LTD  1/08   EF 39%, no sx ischemia  . PACEMAKER PLACEMENT  2/07   Defibrillator    FAMILY HISTORY: Family History  Problem Relation Age of Onset  . Stroke Mother 38  . Heart attack Father   . Coronary artery disease Father   . Heart attack Brother   . Coronary artery disease Other   . Hypertension Other   . Cancer Neg Hx        No breast or colon cancer    ADVANCED DIRECTIVES (Y/N):  N  HEALTH MAINTENANCE: Social History   Tobacco Use  . Smoking status: Current Every Day Smoker    Packs/day: 0.50  . Smokeless tobacco: Never Used  . Tobacco comment: also uses E-cig  Substance Use Topics  . Alcohol use: No    Comment: 2-3 times per month  . Drug use: No     Colonoscopy:  PAP:  Bone density:  Lipid panel:  Allergies  Allergen Reactions  . Atorvastatin     REACTION: increased LFT's  . Lisinopril     REACTION: cough  . Oxycodone Hcl     REACTION: unspecified  . Simvastatin     REACTION: myalgias    Current Outpatient Medications  Medication Sig Dispense Refill  . albuterol (VENTOLIN HFA) 108 (90 Base) MCG/ACT inhaler INHALE 2 PUFFS BY MOUTH 4 TIMES A DAY ASNEEDED    . aspirin 81 MG tablet Take 81 mg by mouth daily.    . carvedilol (COREG) 25 MG tablet TAKE 1 TABLET BY MOUTH TWICE A DAY 60 tablet 2  . Cholecalciferol (VITAMIN D) 2000 UNITS tablet Take 2,000 Units by mouth daily.    Marland Kitchen lidocaine-prilocaine (EMLA) cream Apply to affected area once 30 g 3  . loratadine (CLARITIN) 10 MG tablet Take by mouth.    Marland Kitchen LORazepam (ATIVAN) 0.5 MG tablet Take 0.5 mg by mouth 2 (two) times daily.     . Multiple Vitamin  (MULTIVITAMIN) tablet Take 1 tablet by mouth daily.    . pantoprazole (PROTONIX) 40 MG tablet TAKE 1 TABLET BY MOUTH ONCE DAILY    . Polyethylene Glycol 3350 (PEG 3350) POWD Take by mouth.    . rosuvastatin (CRESTOR) 10 MG tablet Take 10 mg by mouth at bedtime.      . valsartan (DIOVAN) 320 MG tablet Take 320 mg by mouth daily.    . bisacodyl (DULCOLAX) 5 MG EC tablet Take 5 mg by mouth daily as needed for mild constipation or moderate constipation.    . dicyclomine (BENTYL) 20 MG tablet Take by mouth.    . tamoxifen (NOLVADEX) 20 MG tablet Take 1 tablet (20 mg total) by mouth daily. 90 tablet 3   No current facility-administered medications for this visit.     OBJECTIVE: Vitals:   12/24/18 0956  BP: 97/63  Pulse: 73  Temp: (!) 97.5 F (36.4 C)     Body mass index is 15.91 kg/m.    ECOG FS:0 - Asymptomatic  General: Well-developed, well-nourished, no acute distress. Eyes: Pink conjunctiva, anicteric sclera. HEENT: Normocephalic, moist mucous membranes, clear oropharnyx. Breast: ICD/pacemaker noticed in chest wall.  Left breast without lumps or masses with well healing wound. Lungs: Clear to auscultation bilaterally. Heart: Regular rate and rhythm. No rubs, murmurs, or gallops. Abdomen: Soft, nontender, nondistended. No organomegaly noted, normoactive bowel sounds. Musculoskeletal: No edema, cyanosis, or clubbing. Neuro: Alert, answering all questions appropriately. Cranial nerves grossly intact. Skin: No rashes or petechiae noted. Psych: Normal affect.  LAB RESULTS:  Lab Results  Component Value Date   NA 139 03/09/2018   K 5.1 03/09/2018   CL 103 03/09/2018   CO2 27 03/09/2018   GLUCOSE 89 03/09/2018   BUN 10 03/09/2018   CREATININE 0.87 03/09/2018   CALCIUM 9.6 03/09/2018   PROT 6.9 03/09/2018   ALBUMIN 4.2 03/09/2018   AST 23 03/09/2018   ALT 11 03/09/2018   ALKPHOS 82 03/09/2018   BILITOT 0.7 03/09/2018   GFRNONAA >60 03/09/2018   GFRAA >60 03/09/2018     Lab Results  Component Value Date   WBC 5.9 03/09/2018   NEUTROABS 3.4 03/09/2018   HGB 12.8 03/09/2018   HCT 38.0 03/09/2018   MCV 91.5 03/09/2018   PLT 127 (L) 03/09/2018     STUDIES: No results found.  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  PLAN:    1. 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 She received one dose of chemotherapy using Taxotere and Cytoxan in 2013. She did not receive any XRT or hormonal treatment. PET scan  results from July 08, 2016 reviewed independently possibly suggesting metastatic lesions in her lung, but these are too small to characterize. Patient received 1 dose of Taxotere and Cytoxan again on July 30, 2016, but then did not return for any further follow-up.  Patient admits she is hesitant to undergo surgery or chemotherapy, and given her history of compliance is unclear if XRT would offer any benefit.  Patient could not tolerate letrozole therefore this was discontinued.  She was tolerating tamoxifen with improvement of her disease, but ran out of medication nearly 3 months ago.  She was given a refill today.  Patient has not had a mammogram in several years and this is been ordered.  Return to clinic in 6 months for further evaluation.   2.  Breast wound: Resolved.    I spent a total of 30 minutes face-to-face with the patient of which greater than 50% of the visit was spent in counseling and coordination of care as detailed above.   Patient expressed understanding and was in agreement with this plan. She also understands that She can call clinic at any time with any questions, concerns, or complaints.   Cancer Staging Breast cancer of upper-inner quadrant of right female breast [facility]) Staging form: Breast, AJCC 8th Edition  - Clinical: Stage IIIB (cT4b, cN1, cM0, G2, ER: Positive, PR: Positive, HER2: Negative) - Signed by Lloyd Huger, MD on 07/01/2016   Lloyd Huger, MD   12/26/2018 9:00 AM

## 2018-12-29 ENCOUNTER — Other Ambulatory Visit: Payer: Self-pay | Admitting: Oncology

## 2018-12-29 DIAGNOSIS — Z170 Estrogen receptor positive status [ER+]: Secondary | ICD-10-CM

## 2018-12-29 DIAGNOSIS — C50211 Malignant neoplasm of upper-inner quadrant of right female breast: Secondary | ICD-10-CM

## 2018-12-29 MED ORDER — TAMOXIFEN CITRATE 20 MG PO TABS: 20.0000 mg | ORAL_TABLET | Freq: Every day | ORAL | 3 refills | Status: AC

## 2018-12-29 NOTE — Addendum Note (Signed)
Addended by: Wayna Chalet on: 12/29/2018 10:58 AM   Modules accepted: Orders

## 2018-12-30 ENCOUNTER — Telehealth: Payer: Self-pay | Admitting: *Deleted

## 2018-12-30 NOTE — Telephone Encounter (Signed)
Patient called to let you know that she had her breast imaging in [place], she gave those images to Dr. Dierdre Highman office at the time she transferred care back locally. Please advise as how to proceed.

## 2018-12-30 NOTE — Telephone Encounter (Signed)
That was over 2 years ago. She'll have to help [place] get the images either from [facility] or the original imaging center.

## 2018-12-31 ENCOUNTER — Telehealth: Payer: Self-pay

## 2018-12-31 NOTE — Telephone Encounter (Signed)
Called patient to let her know that she needed to call [facility] and sign a release of information so they could send a copy of her images to [facility]. Patient understood and stated that she would go ahead and go next week.

## 2019-02-01 ENCOUNTER — Telehealth (INDEPENDENT_AMBULATORY_CARE_PROVIDER_SITE_OTHER): Payer: Self-pay | Admitting: Vascular Surgery

## 2019-02-01 NOTE — Telephone Encounter (Signed)
Patient was last seen on 03/31/17 she was scheduled then for LE u/s and ABI and see Schnier, patient no showed for the appt. Patient needs to have the left LE duplex and ABI and see either Arna Medici or Dr. Delana Meyer per Eulogio Ditch NP.

## 2019-02-08 ENCOUNTER — Other Ambulatory Visit (INDEPENDENT_AMBULATORY_CARE_PROVIDER_SITE_OTHER): Payer: Self-pay | Admitting: Nurse Practitioner

## 2019-02-08 DIAGNOSIS — M79605 Pain in left leg: Secondary | ICD-10-CM

## 2019-02-09 ENCOUNTER — Ambulatory Visit (INDEPENDENT_AMBULATORY_CARE_PROVIDER_SITE_OTHER): Payer: Medicare Other

## 2019-02-09 ENCOUNTER — Encounter (INDEPENDENT_AMBULATORY_CARE_PROVIDER_SITE_OTHER): Payer: Self-pay | Admitting: Nurse Practitioner

## 2019-02-09 ENCOUNTER — Encounter (INDEPENDENT_AMBULATORY_CARE_PROVIDER_SITE_OTHER): Payer: Self-pay

## 2019-02-09 ENCOUNTER — Ambulatory Visit (INDEPENDENT_AMBULATORY_CARE_PROVIDER_SITE_OTHER): Payer: Medicare Other | Admitting: Nurse Practitioner

## 2019-02-09 ENCOUNTER — Other Ambulatory Visit: Payer: Self-pay

## 2019-02-09 VITALS — BP 127/69 | HR 65 | Resp 14 | Wt 89.8 lb

## 2019-02-09 DIAGNOSIS — M79605 Pain in left leg: Secondary | ICD-10-CM | POA: Diagnosis not present

## 2019-02-09 DIAGNOSIS — I70222 Atherosclerosis of native arteries of extremities with rest pain, left leg: Secondary | ICD-10-CM

## 2019-02-09 DIAGNOSIS — M7989 Other specified soft tissue disorders: Secondary | ICD-10-CM | POA: Diagnosis not present

## 2019-02-09 DIAGNOSIS — E7849 Other hyperlipidemia: Secondary | ICD-10-CM

## 2019-02-09 DIAGNOSIS — Z720 Tobacco use: Secondary | ICD-10-CM | POA: Diagnosis not present

## 2019-02-11 ENCOUNTER — Telehealth (INDEPENDENT_AMBULATORY_CARE_PROVIDER_SITE_OTHER): Payer: Self-pay

## 2019-02-11 ENCOUNTER — Other Ambulatory Visit: Payer: Self-pay

## 2019-02-11 ENCOUNTER — Other Ambulatory Visit
Admission: RE | Admit: 2019-02-11 | Discharge: 2019-02-11 | Disposition: A | Discharge status: Home / Self Care | Payer: Medicare Other | Source: Ambulatory Visit | Attending: Vascular Surgery | Admitting: Vascular Surgery

## 2019-02-11 DIAGNOSIS — Z20828 Contact with and (suspected) exposure to other viral communicable diseases: Secondary | ICD-10-CM | POA: Diagnosis not present

## 2019-02-11 DIAGNOSIS — Z01812 Encounter for preprocedural laboratory examination: Secondary | ICD-10-CM | POA: Insufficient documentation

## 2019-02-11 NOTE — Telephone Encounter (Signed)
Spoke with the patient to give her the procedure appts with Dr. Delana Meyer. Patient is scheduled for bilateral leg angiograms at the [facility] on  02/15/2019 with a 6:45 am arrival time and 02/22/2019 with a 6:45 am arrival time. Patient will do her Covid test for her 02/15/2019 angio today 02/11/2019 between 12:30-2:30 pm at the [place].  Pre-procedure instructions were discussed and they will be mailed to the patient.

## 2019-02-12 LAB — SARS CORONAVIRUS 2 (TAT 6-24 HRS): SARS Coronavirus 2: NEGATIVE

## 2019-02-13 ENCOUNTER — Encounter (INDEPENDENT_AMBULATORY_CARE_PROVIDER_SITE_OTHER): Payer: Self-pay | Admitting: Nurse Practitioner

## 2019-02-13 DIAGNOSIS — I70229 Atherosclerosis of native arteries of extremities with rest pain, unspecified extremity: Secondary | ICD-10-CM | POA: Insufficient documentation

## 2019-02-13 DIAGNOSIS — M7989 Other specified soft tissue disorders: Secondary | ICD-10-CM | POA: Insufficient documentation

## 2019-02-13 NOTE — Progress Notes (Signed)
SUBJECTIVE:  Patient ID: Christine [name], female    DOB: [birth year], 73 y.o.   MRN: [id number] Chief Complaint  Patient presents with  . Follow-up    ultrasound follow up    HPI  Christine [name] is a 73 y.o. female The patient returns to the office for followup and review of the noninvasive studies. There has been a significant deterioration in the lower extremity symptoms.  The patient notes interval shortening of their claudication distance and development of mild rest pain symptoms. No new ulcers or wounds have occurred since the last visit.  There have been no significant changes to the patient's overall health care.  The patient denies amaurosis fugax or recent TIA symptoms. There are no recent neurological changes noted. The patient denies history of DVT, PE or superficial thrombophlebitis. The patient denies recent episodes of angina or shortness of breath.   ABI's Rt=0.95 and Lt=0.49 (previous ABI's not available ) Duplex US of the lower extremity arterial system shows monophasic waveforms in the bilateral tibial arteries.  Th left lower extremity has severely dampened waveforms of the first toe with flat waveforms of the rest of the digits.  The right lower extremity has dampened 1st and 2nd digit waveforms with flat waveforms for the rest of the digits.    Left venous reflux reveals no evidence of chronic venous insufficiency, DVT or superficial venous thrombosis.    Past Medical History:  Diagnosis Date  . Allergic rhinitis   . Anxiety   . Breast cancer ([place])   . Chronic systolic heart failure ([place])   . Coronary artery disease   . GERD (gastroesophageal reflux disease)   . Heart murmur   . Hyperlipidemia   . Hypertension   . Implantable defibrillator St Jude    DOI 2006  . MI (myocardial infarction) ([place])   . Osteoarthritis     Past Surgical History:  Procedure Laterality Date  . CHF exacerbation  10/07  . CORONARY STENT PLACEMENT  8/06   3 stents; myocardial  infarction  . NM MYOVIEW LTD  1/08   EF 39%, no sx ischemia  . PACEMAKER PLACEMENT  2/07   Defibrillator    Social History   Socioeconomic History  . Marital status: Divorced    Spouse name: Not on file  . Number of children: 1  . Years of education: Not on file  . Highest education level: Not on file  Occupational History  . Occupation: Artist    Comment: Disabled  Social Needs  . Financial resource strain: Not on file  . Food insecurity    Worry: Not on file    Inability: Not on file  . Transportation needs    Medical: Not on file    Non-medical: Not on file  Tobacco Use  . Smoking status: Current Every Day Smoker    Packs/day: 0.50  . Smokeless tobacco: Never Used  . Tobacco comment: also uses E-cig  Substance and Sexual Activity  . Alcohol use: No    Comment: 2-3 times per month  . Drug use: No  . Sexual activity: Not on file  Lifestyle  . Physical activity    Days per week: Not on file    Minutes per session: Not on file  . Stress: Not on file  Relationships  . Social Herbalist on phone: Not on file    Gets together: Not on file    Attends religious service: Not on file  Active member of club or organization: Not on file    Attends meetings of clubs or organizations: Not on file    Relationship status: Not on file  . Intimate partner violence    Fear of current or ex partner: Not on file    Emotionally abused: Not on file    Physically abused: Not on file    Forced sexual activity: Not on file  Other Topics Concern  . Not on file  Social History Narrative   Pt is disabled but is hoping to back to work part time.   Has been divorced twice.   Gets regular exercise.    Family History  Problem Relation Age of Onset  . Stroke Mother 50  . Heart attack Father   . Coronary artery disease Father   . Heart attack Brother   . Coronary artery disease Other   . Hypertension Other   . Cancer Neg Hx        No breast or colon cancer     Allergies  Allergen Reactions  . Atorvastatin Other (See Comments)    increased LFT's  . Lisinopril Cough  . Oxycodone Hcl Itching and Other (See Comments)    Restless & jittery  . Simvastatin Other (See Comments)    myalgias     Review of Systems   Review of Systems: Negative Unless Checked Constitutional: [] Weight loss  [] Fever  [] Chills Cardiac: [] Chest pain   []  Atrial Fibrillation  [] Palpitations   [] Shortness of breath when laying flat   [] Shortness of breath with exertion. [] Shortness of breath at rest Vascular:  [x] Pain in legs with walking   [] Pain in legs with standing [x] Pain in legs when laying flat   [] Claudication    [] Pain in feet when laying flat    [] History of DVT   [] Phlebitis   [x] Swelling in legs   [] Varicose veins   [] Non-healing ulcers Pulmonary:   [] Uses home oxygen   [] Productive cough   [] Hemoptysis   [] Wheeze  [] COPD   [] Asthma Neurologic:  [] Dizziness   [] Seizures  [] Blackouts [] History of stroke   [] History of TIA  [] Aphasia   [] Temporary Blindness   [] Weakness or numbness in arm   [] Weakness or numbness in leg Musculoskeletal:   [] Joint swelling   [] Joint pain   [] Low back pain  []  History of Knee Replacement [] Arthritis [] back Surgeries  []  Spinal Stenosis    Hematologic:  [] Easy bruising  [] Easy bleeding   [x] Hypercoagulable state   [] Anemic Gastrointestinal:  [] Diarrhea   [] Vomiting  [x] Gastroesophageal reflux/heartburn   [] Difficulty swallowing. [] Abdominal pain Genitourinary:  [] Chronic kidney disease   [] Difficult urination  [] Anuric   [] Blood in urine [] Frequent urination  [] Burning with urination   [] Hematuria Skin:  [] Rashes   [] Ulcers [] Wounds Psychological:  [] History of anxiety   []  History of major depression  []  Memory Difficulties      OBJECTIVE:   Physical Exam  BP 127/69 (BP Location: Right Arm)   Pulse 65   Resp 14   Wt 89 lb 12.8 oz (40.7 kg)   BMI 16.42 kg/m   Gen: WD/WN, NAD Head: [place]/AT, No temporalis wasting.   Ear/Nose/Throat: Hearing grossly intact, nares w/o erythema or drainage Eyes: PER, EOMI, sclera nonicteric.  Neck: Supple, no masses.  No JVD.  Pulmonary:  Good air movement, no use of accessory muscles.  Cardiac: RRR Vascular:  Vessel Right Left  Radial Palpable Palpable  Dorsalis Pedis Not Palpable Not Palpable  Posterior Tibial Not Palpable Not  Palpable   Gastrointestinal: soft, non-distended. No guarding/no peritoneal signs.  Musculoskeletal: M/S 5/5 throughout.  No deformity or atrophy.  Neurologic: Pain and light touch intact in extremities.  Symmetrical.  Speech is fluent. Motor exam as listed above. Psychiatric: Judgment intact, Mood & affect appropriate for pt's clinical situation. Dermatologic: No Venous rashes. No Ulcers Noted.  No changes consistent with cellulitis. Lymph : No Cervical lymphadenopathy, no lichenification or skin changes of chronic lymphedema.       ASSESSMENT AND PLAN:  1. Atherosclerosis of native artery of left lower extremity with rest pain (HCC) Recommend:  The patient has evidence of severe atherosclerotic changes of both lower extremities with rest pain that is associated with preulcerative changes and impending tissue loss of the foot.  This represents a limb threatening ischemia and places the patient at the risk for limb loss.  Patient should undergo angiography of the lower extremities with the hope for intervention for limb salvage.  The risks and benefits as well as the alternative therapies was discussed in detail with the patient.  All questions were answered.  Patient agrees to proceed with angiography.  The patient will follow up with me in the office after the procedure.       2. Leg swelling No surgery or intervention at this point in time.    I have reviewed my discussion with the patient regarding venous insufficiency and secondary lymph edema and why it  causes symptoms. I have discussed with the patient the chronic skin changes  that accompany these problems and the long term sequela such as ulceration and infection.  Patient will continue wearing graduated compression stockings class 1 (20-30 mmHg) on a daily basis a prescription was given to the patient to keep this updated. The patient will  put the stockings on first thing in the morning and removing them in the evening. The patient is instructed specifically not to sleep in the stockings.  In addition, behavioral modification including elevation during the day will be continued.  Diet and salt restriction was also discussed.  Previous duplex ultrasound of the lower extremities shows normal deep venous system, superficial reflux was not present.        3. Other hyperlipidemia Continue statin as ordered and reviewed, no changes at this time   4. Current tobacco use Smoking cessation was discussed, 3-10 minutes spent on this topic specifically    Current Outpatient Medications on File Prior to Visit  Medication Sig Dispense Refill  . albuterol (VENTOLIN HFA) 108 (90 Base) MCG/ACT inhaler Inhale 2 puffs into the lungs 4 (four) times daily as needed (wheezing/shortness of breath).     . bisacodyl (DULCOLAX) 5 MG EC tablet Take 5 mg by mouth daily as needed for mild constipation or moderate constipation.    . carvedilol (COREG) 25 MG tablet TAKE 1 TABLET BY MOUTH TWICE A DAY (Patient taking differently: Take 25 mg by mouth 2 (two) times daily. ) 60 tablet 2  . Cholecalciferol (VITAMIN D) 2000 UNITS tablet Take 2,000 Units by mouth daily.    Marland Kitchen loratadine (CLARITIN) 10 MG tablet Take 10 mg by mouth daily as needed for allergies.     Marland Kitchen LORazepam (ATIVAN) 0.5 MG tablet Take 0.25 mg by mouth 2 (two) times daily.     . pantoprazole (PROTONIX) 40 MG tablet Take 40 mg by mouth daily.     . Polyethylene Glycol 3350 (PEG 3350) POWD Take 17 g by mouth daily as needed (constipation.).     Marland Kitchen  rosuvastatin (CRESTOR) 10 MG tablet Take 10 mg by mouth daily.     . tamoxifen  (NOLVADEX) 20 MG tablet Take 1 tablet (20 mg total) by mouth daily. (Patient taking differently: Take 20 mg by mouth daily at 12 noon. ) 90 tablet 3  . valsartan (DIOVAN) 320 MG tablet Take 320 mg by mouth daily at 12 noon.     . dicyclomine (BENTYL) 20 MG tablet Take by mouth.     No current facility-administered medications on file prior to visit.     There are no Patient Instructions on file for this visit. No follow-ups on file.   Kris Hartmann, NP  This note was completed with Sales executive.  Any errors are purely unintentional.

## 2019-02-14 ENCOUNTER — Telehealth (INDEPENDENT_AMBULATORY_CARE_PROVIDER_SITE_OTHER): Payer: Self-pay | Admitting: Nurse Practitioner

## 2019-02-14 ENCOUNTER — Telehealth (INDEPENDENT_AMBULATORY_CARE_PROVIDER_SITE_OTHER): Payer: Self-pay | Admitting: Vascular Surgery

## 2019-02-14 NOTE — Telephone Encounter (Signed)
Patient is scheduled for LEA tomorrow 02/15/19 and called Friday and left message saying was she supposed to be taking and antibiotic? Also asking about pain control for after the procedure. Stated she take Tramadol now but that's not going to cut it. Please advise. AS, CMA

## 2019-02-14 NOTE — Telephone Encounter (Signed)
Patient has been advised of the below.  Patient requesting Hydrocodone to be sent in now because she is in extreme pain. Per [place]-Patient can continue to take Tramadol for pain. Patient has been made aware. AS, CMA

## 2019-02-14 NOTE — Telephone Encounter (Signed)
Patient left message on nurse line stating that she wanted to know when she was going to see Schnier and who was doing her procedure tomorrow.  I called patient back and advised her that he would see her tomorrow and he was doing the procedure.  Patient stated she thought she should cancel the procedure because she wasn't sure if she had medical insurance. I advised her that we were showing in our system that she had been e-verified for [facility] and that she did not require prior auth with her insurance. Patient in the end decided to keep tomorrows apt. AS< CMA

## 2019-02-14 NOTE — Telephone Encounter (Signed)
The patient is generally not prescribed an antibiotic prior to the procedure.  We generally will give antibiotics during the procedure.  As far as pain control after the procedure they will give her medication during the procedure to help with pain as well as after.  Depending on her level of pain after we can reassess what pain medication may be needed at that time however I cannot state what pain medicine will be given until that point.

## 2019-02-15 ENCOUNTER — Other Ambulatory Visit: Payer: Self-pay

## 2019-02-15 ENCOUNTER — Ambulatory Visit
Admission: RE | Admit: 2019-02-15 | Discharge: 2019-02-15 | Disposition: A | Discharge status: Home / Self Care | Payer: Medicare Other | Attending: Vascular Surgery | Admitting: Vascular Surgery

## 2019-02-15 ENCOUNTER — Encounter
Admission: RE | Disposition: A | Discharge status: Home / Self Care | Payer: Self-pay | Source: Home / Self Care | Attending: Vascular Surgery

## 2019-02-15 ENCOUNTER — Other Ambulatory Visit (INDEPENDENT_AMBULATORY_CARE_PROVIDER_SITE_OTHER): Payer: Self-pay | Admitting: Nurse Practitioner

## 2019-02-15 DIAGNOSIS — I110 Hypertensive heart disease with heart failure: Secondary | ICD-10-CM | POA: Diagnosis not present

## 2019-02-15 DIAGNOSIS — I70222 Atherosclerosis of native arteries of extremities with rest pain, left leg: Secondary | ICD-10-CM | POA: Diagnosis not present

## 2019-02-15 DIAGNOSIS — F419 Anxiety disorder, unspecified: Secondary | ICD-10-CM | POA: Diagnosis not present

## 2019-02-15 DIAGNOSIS — F17210 Nicotine dependence, cigarettes, uncomplicated: Secondary | ICD-10-CM | POA: Insufficient documentation

## 2019-02-15 DIAGNOSIS — M1990 Unspecified osteoarthritis, unspecified site: Secondary | ICD-10-CM | POA: Diagnosis not present

## 2019-02-15 DIAGNOSIS — I2510 Atherosclerotic heart disease of native coronary artery without angina pectoris: Secondary | ICD-10-CM | POA: Insufficient documentation

## 2019-02-15 DIAGNOSIS — K219 Gastro-esophageal reflux disease without esophagitis: Secondary | ICD-10-CM | POA: Diagnosis not present

## 2019-02-15 DIAGNOSIS — E785 Hyperlipidemia, unspecified: Secondary | ICD-10-CM | POA: Diagnosis not present

## 2019-02-15 DIAGNOSIS — Z79899 Other long term (current) drug therapy: Secondary | ICD-10-CM | POA: Diagnosis not present

## 2019-02-15 DIAGNOSIS — M79605 Pain in left leg: Secondary | ICD-10-CM | POA: Diagnosis not present

## 2019-02-15 DIAGNOSIS — Z853 Personal history of malignant neoplasm of breast: Secondary | ICD-10-CM | POA: Insufficient documentation

## 2019-02-15 DIAGNOSIS — I70211 Atherosclerosis of native arteries of extremities with intermittent claudication, right leg: Secondary | ICD-10-CM

## 2019-02-15 DIAGNOSIS — I701 Atherosclerosis of renal artery: Secondary | ICD-10-CM | POA: Diagnosis not present

## 2019-02-15 DIAGNOSIS — I252 Old myocardial infarction: Secondary | ICD-10-CM | POA: Insufficient documentation

## 2019-02-15 DIAGNOSIS — Z95810 Presence of automatic (implantable) cardiac defibrillator: Secondary | ICD-10-CM | POA: Diagnosis not present

## 2019-02-15 DIAGNOSIS — I70223 Atherosclerosis of native arteries of extremities with rest pain, bilateral legs: Secondary | ICD-10-CM | POA: Insufficient documentation

## 2019-02-15 DIAGNOSIS — I5022 Chronic systolic (congestive) heart failure: Secondary | ICD-10-CM | POA: Diagnosis not present

## 2019-02-15 DIAGNOSIS — I70229 Atherosclerosis of native arteries of extremities with rest pain, unspecified extremity: Secondary | ICD-10-CM

## 2019-02-15 HISTORY — PX: LOWER EXTREMITY ANGIOGRAPHY: CATH118251

## 2019-02-15 LAB — CREATININE, SERUM
Creatinine, Ser: 1 mg/dL (ref 0.44–1.00)
GFR calc Af Amer: >60 mL/min (ref >60)
GFR calc non Af Amer: 57 mL/min — ABNORMAL LOW (ref >60)

## 2019-02-15 LAB — BUN: BUN: 12 mg/dL (ref 8–23)

## 2019-02-15 SURGERY — LOWER EXTREMITY ANGIOGRAPHY
Anesthesia: Moderate Sedation | Site: Leg Lower | Laterality: Left

## 2019-02-15 MED ORDER — SODIUM CHLORIDE 0.9 % IV SOLN: 250.0000 mL | INTRAVENOUS | Status: DC | PRN

## 2019-02-15 MED ORDER — CLOPIDOGREL BISULFATE 75 MG PO TABS: 75.0000 mg | ORAL_TABLET | Freq: Every day | ORAL | 4 refills | Status: DC

## 2019-02-15 MED ORDER — HEPARIN SODIUM (PORCINE) 1000 UNIT/ML IJ SOLN
INTRAMUSCULAR | Status: DC | PRN
  Administered 2019-02-15: 4000 [IU] via INTRAVENOUS

## 2019-02-15 MED ORDER — HYDRALAZINE HCL 20 MG/ML IJ SOLN: 5.0000 mg | INTRAMUSCULAR | Status: DC | PRN

## 2019-02-15 MED ORDER — LABETALOL HCL 5 MG/ML IV SOLN: 10.0000 mg | INTRAVENOUS | Status: DC | PRN

## 2019-02-15 MED ORDER — SODIUM CHLORIDE 0.9 % IV SOLN: INTRAVENOUS | Status: DC

## 2019-02-15 MED ORDER — CEFAZOLIN SODIUM-DEXTROSE 2-4 GM/100ML-% IV SOLN
2.0000 g | Freq: Once | INTRAVENOUS | Status: AC
  Administered 2019-02-15: 2 g via INTRAVENOUS

## 2019-02-15 MED ORDER — MIDAZOLAM HCL 2 MG/2ML IJ SOLN
INTRAMUSCULAR | Status: DC | PRN
  Administered 2019-02-15: 1 mg via INTRAVENOUS
  Administered 2019-02-15: 2 mg via INTRAVENOUS

## 2019-02-15 MED ORDER — SODIUM CHLORIDE 0.9% FLUSH: 3.0000 mL | Freq: Two times a day (BID) | INTRAVENOUS | Status: DC

## 2019-02-15 MED ORDER — ONDANSETRON HCL 4 MG/2ML IJ SOLN: 4.0000 mg | Freq: Four times a day (QID) | INTRAMUSCULAR | Status: DC | PRN

## 2019-02-15 MED ORDER — CLOPIDOGREL BISULFATE 75 MG PO TABS
ORAL_TABLET | ORAL | Status: AC
  Filled 2019-02-15: qty 4

## 2019-02-15 MED ORDER — FAMOTIDINE 20 MG PO TABS: 40.0000 mg | ORAL_TABLET | Freq: Once | ORAL | Status: DC | PRN

## 2019-02-15 MED ORDER — SODIUM CHLORIDE 0.9% FLUSH: 3.0000 mL | INTRAVENOUS | Status: DC | PRN

## 2019-02-15 MED ORDER — HYDROMORPHONE HCL 1 MG/ML IJ SOLN: 1.0000 mg | Freq: Once | INTRAMUSCULAR | Status: DC | PRN

## 2019-02-15 MED ORDER — CLOPIDOGREL BISULFATE 300 MG PO TABS
300.0000 mg | ORAL_TABLET | Freq: Once | ORAL | Status: AC
  Administered 2019-02-15: 300 mg via ORAL

## 2019-02-15 MED ORDER — MIDAZOLAM HCL 2 MG/ML PO SYRP: 8.0000 mg | ORAL_SOLUTION | Freq: Once | ORAL | Status: DC | PRN

## 2019-02-15 MED ORDER — MORPHINE SULFATE (PF) 4 MG/ML IV SOLN: 2.0000 mg | INTRAVENOUS | Status: DC | PRN

## 2019-02-15 MED ORDER — METHYLPREDNISOLONE SODIUM SUCC 125 MG IJ SOLR: 125.0000 mg | Freq: Once | INTRAMUSCULAR | Status: DC | PRN

## 2019-02-15 MED ORDER — MIDAZOLAM HCL 5 MG/5ML IJ SOLN
INTRAMUSCULAR | Status: AC
  Filled 2019-02-15: qty 10

## 2019-02-15 MED ORDER — CEFAZOLIN SODIUM-DEXTROSE 2-4 GM/100ML-% IV SOLN
INTRAVENOUS | Status: AC
  Filled 2019-02-15: qty 100

## 2019-02-15 MED ORDER — IODIXANOL 320 MG/ML IV SOLN
INTRAVENOUS | Status: DC | PRN
  Administered 2019-02-15: 11:00:00 50 mL via INTRA_ARTERIAL

## 2019-02-15 MED ORDER — ACETAMINOPHEN 325 MG PO TABS: 650.0000 mg | ORAL_TABLET | ORAL | Status: DC | PRN

## 2019-02-15 MED ORDER — FENTANYL CITRATE (PF) 100 MCG/2ML IJ SOLN
INTRAMUSCULAR | Status: AC
  Filled 2019-02-15: qty 4

## 2019-02-15 MED ORDER — HYDROCODONE-ACETAMINOPHEN 5-325 MG PO TABS: 1.0000 | ORAL_TABLET | Freq: Four times a day (QID) | ORAL | 0 refills | Status: DC | PRN

## 2019-02-15 MED ORDER — DIPHENHYDRAMINE HCL 50 MG/ML IJ SOLN: 50.0000 mg | Freq: Once | INTRAMUSCULAR | Status: DC | PRN

## 2019-02-15 MED ORDER — HEPARIN SODIUM (PORCINE) 1000 UNIT/ML IJ SOLN
INTRAMUSCULAR | Status: AC
  Filled 2019-02-15: qty 1

## 2019-02-15 MED ORDER — FENTANYL CITRATE (PF) 100 MCG/2ML IJ SOLN
INTRAMUSCULAR | Status: DC | PRN
  Administered 2019-02-15 (×2): 50 ug via INTRAVENOUS

## 2019-02-15 SURGICAL SUPPLY — 21 items
CATH BEACON 5 .035 65 KMP TIP (CATHETERS) ×2 IMPLANT
CATH PIG 70CM (CATHETERS) ×2 IMPLANT
CATH VS15FR (CATHETERS) ×2 IMPLANT
DEVICE CLOSURE MYNXGRIP 5F (Vascular Products) ×2 IMPLANT
DEVICE CLOSURE MYNXGRIP 6/7F (Vascular Products) ×2 IMPLANT
DEVICE PRESTO INFLATION (MISCELLANEOUS) ×2 IMPLANT
DEVICE TORQUE .025-.038 (MISCELLANEOUS) ×2 IMPLANT
GLIDEWIRE ADV .035X260CM (WIRE) ×2 IMPLANT
GUIDEWIRE ANGLED .035 180CM (WIRE) ×2 IMPLANT
NDL ENTRY 21GA 7CM ECHOTIP (NEEDLE) IMPLANT
NEEDLE ENTRY 21GA 7CM ECHOTIP (NEEDLE) ×3 IMPLANT
PACK ANGIOGRAPHY (CUSTOM PROCEDURE TRAY) ×3 IMPLANT
SET INTRO CAPELLA COAXIAL (SET/KITS/TRAYS/PACK) ×2 IMPLANT
SHEATH BRITE TIP 5FRX11 (SHEATH) ×4 IMPLANT
SHEATH BRITE TIP 6FRX11 (SHEATH) ×2 IMPLANT
SHEATH HIGHFLEX ANSEL 6FRX55 (SHEATH) ×2 IMPLANT
STENT LIFESTREAM 7X16X80 (Permanent Stent) ×2 IMPLANT
SYR MEDRAD MARK 7 150ML (SYRINGE) ×2 IMPLANT
TUBING CONTRAST HIGH PRESS 72 (TUBING) ×2 IMPLANT
WIRE J 3MM .035X145CM (WIRE) ×2 IMPLANT
WIRE MAGIC TOR.035 180C (WIRE) ×2 IMPLANT

## 2019-02-15 NOTE — H&P (Signed)
[facility] History & Physical Update  The patient was interviewed and re-examined.  The patient's previous History and Physical has been reviewed and is unchanged.  There is no change in the plan of care. We plan to proceed with the scheduled procedure.  Hortencia Pilar, MD  02/15/2019, 9:41 AM

## 2019-02-15 NOTE — Op Note (Signed)
[facility] Percutaneous Study/Intervention Procedural Note    Surgeon(s): Nurse, children's: None  Pre-operative Diagnosis: 1.  Atherosclerotic occlusive disease bilateral lower extremities with mild rest pain of the left lower extremity  Post-operative diagnosis: 1.  Atherosclerotic occlusive disease bilateral lower extremities with mild rest pain of the left lower extremity;  2.  Bilateral greater than 90% renal artery stenosis   Procedure(s) Performed: 1. Ultrasound guidance for vascular access both right and left common femoral artery 2. Catheter placement into right renal artery from left femoral approach 3. Aortogram and selective right renal angiogram 4. Balloon expandable stent placement to right renal artery with a 7 mm diameter x 16 mm length lifestream stent 5. Minx closure device bilateral femoral arteries  Contrast: 50 cc  EBL: Less than 10 cc  Fluoro Time: 10.8 minutes  Moderate conscious sedation: Continuous ECG pulse oximetry and cardiopulmonary monitoring was performed throughout the entire procedure by the interventional radiology nurse total sedation time was 1 hour.  Parenteral Versed and fentanyl were used.  Indications: The patient is a 73 year old woman with worsening rest pain left foot more than right.  Noninvasive studies as well as physical examination confirmed a ischemic left lower extremity.  Angiography with the hope for intervention is recommended for limb salvage. Risks and benefits are discussed and informed consent is obtained.  Procedure: The patient was identified and appropriate procedural time out was performed. The patient was then placed supine on the table and prepped and draped in the usual sterile fashion.Moderate conscious sedation was administered with a face to face encounter with the patient throughout the procedure with  my supervision of the RN administering medicines and monitoring the patients vital signs and mental status throughout from the start of the procedure until the patient was taken to the recovery room  Ultrasound was used to evaluate the right common femoral artery. It was patent but demonstrates moderate calcific disease. A digital ultrasound image was acquired. A micropuncture needle was used to access the right common femoral artery under direct ultrasound guidance and a permanent image was performed. A microwire followed by micro-sheath was then placed.  A 0.035 J wire was advanced with significant resistance and under fluoroscopy I could not get the wire to advance past the mid external iliac level.  I was able to place a 5Fr sheath and hand-injection of contrast demonstrated greater than 90% stenosis at multiple levels throughout the external iliac artery.  Attempts at crossing knees with an advantage wire were not successful and therefore I elected to place a sheath on the left.  Also this decision was made because initial hand-injection all demonstrates greater than 90% stenoses of both common iliacs situation that will require kissing balloons and stents and access from both common femorals.  I turned my attention to the left groin ultrasound was placed in a sterile sleeve.  Images recorded the permanent record.  We left common femoral artery identified again it shows moderate calcific disease.  Microneedle is inserted and a microwire followed by a micro-sheath.  Over the J-wire did not advance the advantage wire did under fluoroscopy and then a 5 French sheath was placed.  The advantage wire was then negotiated into the descending thoracic aorta and a pigtail catheter was placed at the level of T12..   AP aortogram was then placed which demonstrated multiple hemodynamically significant lesions.  The right renal artery demonstrates a greater than 90% ostial stenosis the renal arteries otherwise  quite large  and the nephrogram appears to be slightly larger than average and fills well.  In contradistinction the left renal artery is quite small it demonstrates a greater than 90% stenosis at its origin as well but the nephrogram is fairly small and poorly filling.  There are to greater than 90% stenoses within the infrarenal aorta with poststenotic dilatation located between them.  Iliac disease is noted as above from the hand-injection it is confirmed with an aortic injection multiple greater than 90% stenoses in both common iliac arteries as well as both external iliac arteries.  Of note previously placed distal left external iliac artery stent is widely patent the hemodynamically significant lesions are proximal to the stent in the left external iliac artery.   The patient was then systemically heparinized with 4000 units of intravenous heparin. I used a V S1 catheter to cannulate the right renal artery and selective imaging was performed. This confirmed a 90% stenosis of the origin of the right renal artery.  At this point I selected the Magic torque wire and crossed the lesion without difficulty.   I then selected a 7 mm diameter x 16 mm length balloon expandable lifestream stent and brought this across the lesion.  This was deployed encompassing the lesion with its proximal extent going back into the aorta for a mm or two.  This was inflated to 10 ATM and the waist resolved.  Completion angiogram showed well-placed stent extending into the aorta approximately 3 mm.  Is fully expanded.  There is less than 5% residual stenosis.   The sheath was removed. Oblique arteriogram was performed of the right and left femoral artery and minx closure device was deployed left side first and then the right in the usual fashion with excellent hemostatic result. The patient was taken to the recovery room in stable condition having tolerated the procedure well.  Findings:  Aortogram/Renal  Arteries: AP aortogram was then placed which demonstrated multiple hemodynamically significant lesions.  The right renal artery demonstrates a greater than 90% ostial stenosis the renal arteries otherwise quite large and the nephrogram appears to be slightly larger than average and fills well.  In contradistinction the left renal artery is quite small it demonstrates a greater than 90% stenosis at its origin as well but the nephrogram is fairly small and poorly filling.  There are to greater than 90% stenoses within the infrarenal aorta with poststenotic dilatation located between them.  Iliac disease is noted as above from the hand-injection it is confirmed with an aortic injection multiple greater than 90% stenoses in both common iliac arteries as well as both external iliac arteries.  Of note previously placed distal left external iliac artery stent is widely patent the hemodynamically significant lesions are proximal to the stent in the left external iliac artery.  As noted there is bilateral greater than 90% stenosis at the origin of both renal arteries.  The right renal is quite large and the nephrogram demonstrates a very large kidney in comparison the left renal is quite small and the left nephrogram is quite faint and much smaller and measurement.  Therefore selected to treat the right prevent loss of renal function.  As noted stent 7 x 16 was placed it was deployed in excellent position.  Follow-up imaging demonstrated less than 5% residual stenosis with rapid filling of the right kidney.  With respect to her aortoiliac disease this is so extensive her aorta will require reconstruction.  I will plan to obtain a CT scan so  that appropriate aortic measurements can be made I will then likely stent the aorta and reconstruct iliac arteries bilaterally simultaneously.  Hopefully this will alleviate her rest pain.    Condition:  Stable  Complications: None   Christine [name] 02/15/2019 10:51  AM  This note was created with Dragon Medical transcription system. Any errors in dictation are purely unintentional.

## 2019-02-16 ENCOUNTER — Encounter: Payer: Self-pay | Admitting: Vascular Surgery

## 2019-02-17 ENCOUNTER — Other Ambulatory Visit (INDEPENDENT_AMBULATORY_CARE_PROVIDER_SITE_OTHER): Payer: Self-pay | Admitting: Nurse Practitioner

## 2019-02-18 ENCOUNTER — Other Ambulatory Visit: Payer: Medicare Other

## 2019-02-18 ENCOUNTER — Telehealth (INDEPENDENT_AMBULATORY_CARE_PROVIDER_SITE_OTHER): Payer: Self-pay | Admitting: Vascular Surgery

## 2019-02-18 NOTE — Telephone Encounter (Signed)
I spoke with Eulogio Ditch NP and she said its fine for the patient use either one to help with constipation and also she recommended that the patient can use miralax with plenty of water or drink coffee. Patient has been inform with medical advice.

## 2019-02-22 ENCOUNTER — Encounter: Admission: RE | Payer: Self-pay | Source: Home / Self Care

## 2019-02-22 ENCOUNTER — Ambulatory Visit: Admission: RE | Admit: 2019-02-22 | Payer: Medicare Other | Source: Home / Self Care | Admitting: Vascular Surgery

## 2019-02-22 SURGERY — LOWER EXTREMITY ANGIOGRAPHY
Anesthesia: Moderate Sedation | Site: Leg Lower | Laterality: Right

## 2019-02-24 ENCOUNTER — Encounter (INDEPENDENT_AMBULATORY_CARE_PROVIDER_SITE_OTHER): Payer: Self-pay | Admitting: Vascular Surgery

## 2019-02-24 ENCOUNTER — Other Ambulatory Visit: Payer: Self-pay

## 2019-02-24 ENCOUNTER — Ambulatory Visit (INDEPENDENT_AMBULATORY_CARE_PROVIDER_SITE_OTHER): Payer: Medicare Other | Admitting: Vascular Surgery

## 2019-02-24 VITALS — BP 138/80 | HR 69 | Resp 16 | Wt 90.2 lb

## 2019-02-24 DIAGNOSIS — J449 Chronic obstructive pulmonary disease, unspecified: Secondary | ICD-10-CM

## 2019-02-24 DIAGNOSIS — E7849 Other hyperlipidemia: Secondary | ICD-10-CM

## 2019-02-24 DIAGNOSIS — I2510 Atherosclerotic heart disease of native coronary artery without angina pectoris: Secondary | ICD-10-CM

## 2019-02-24 DIAGNOSIS — I70223 Atherosclerosis of native arteries of extremities with rest pain, bilateral legs: Secondary | ICD-10-CM

## 2019-02-24 DIAGNOSIS — I10 Essential (primary) hypertension: Secondary | ICD-10-CM

## 2019-02-24 NOTE — Progress Notes (Signed)
MRN : [id number]  Christine [name] is a 73 y.o. (1946-04-13) female who presents with chief complaint of  Chief Complaint  Patient presents with   Follow-up    [facility] 1 week   .  History of Present Illness:   The patient returns to the office for followup of increased leg pain. There has been a significant deterioration in the lower extremity symptoms.  The patient notes interval shortening of their claudication distance and development of mild rest pain symptoms. No new ulcers or wounds have occurred since the last visit.  There have been no significant changes to the patient's overall health care.  The patient denies amaurosis fugax or recent TIA symptoms. There are no recent neurological changes noted. The patient denies history of DVT, PE or superficial thrombophlebitis. The patient denies recent episodes of angina or shortness of breath.    Current Meds  Medication Sig   albuterol (VENTOLIN HFA) 108 (90 Base) MCG/ACT inhaler Inhale 2 puffs into the lungs 4 (four) times daily as needed (wheezing/shortness of breath).    aspirin EC 81 MG tablet Take 81 mg by mouth daily.   bisacodyl (DULCOLAX) 5 MG EC tablet Take 5 mg by mouth daily as needed for mild constipation or moderate constipation.   carvedilol (COREG) 25 MG tablet TAKE 1 TABLET BY MOUTH TWICE A DAY (Patient taking differently: Take 25 mg by mouth 2 (two) times daily. )   Cholecalciferol (VITAMIN D) 2000 UNITS tablet Take 2,000 Units by mouth daily.   clopidogrel (PLAVIX) 75 MG tablet Take 1 tablet (75 mg total) by mouth daily.   Flaxseed, Linseed, (FLAXSEED OIL) 1000 MG CAPS Take 1,000 mg by mouth daily.   furosemide (LASIX) 40 MG tablet Take 40 mg by mouth daily as needed (fluid retention/swelling).   HYDROcodone-acetaminophen (NORCO) 5-325 MG tablet Take 1-2 tablets by mouth every 6 (six) hours as needed for severe pain.   loratadine (CLARITIN) 10 MG tablet Take 10 mg by mouth daily as needed for allergies.      LORazepam (ATIVAN) 0.5 MG tablet Take 0.25 mg by mouth 2 (two) times daily.    Multiple Vitamin (ANTIOXIDANT FORMULA PO) Take 1 tablet by mouth daily.   Multiple Vitamins-Minerals (HEALTHY EYES/LUTEIN PO) Take 1 tablet by mouth daily.   pantoprazole (PROTONIX) 40 MG tablet Take 40 mg by mouth daily.    Polyethylene Glycol 3350 (PEG 3350) POWD Take 17 g by mouth daily as needed (constipation.).    rosuvastatin (CRESTOR) 10 MG tablet Take 10 mg by mouth daily.    tamoxifen (NOLVADEX) 20 MG tablet Take 1 tablet (20 mg total) by mouth daily. (Patient taking differently: Take 20 mg by mouth daily at 12 noon. )   tetrahydrozoline 0.05 % ophthalmic solution Place 1 drop into both eyes 3 (three) times daily as needed (dry/irritated eyes).   traMADol (ULTRAM) 50 MG tablet Take 25 mg by mouth 2 (two) times daily.   valsartan (DIOVAN) 320 MG tablet Take 320 mg by mouth daily at 12 noon.     Past Medical History:  Diagnosis Date   Allergic rhinitis    Anxiety    Breast cancer ([place])    Chronic systolic heart failure (HCC)    Coronary artery disease    GERD (gastroesophageal reflux disease)    Heart murmur    Hyperlipidemia    Hypertension    Implantable defibrillator [employer] Jude    DOI 2006   MI (myocardial infarction) ([place])    Osteoarthritis  Past Surgical History:  Procedure Laterality Date   CHF exacerbation  10/07   CORONARY STENT PLACEMENT  8/06   3 stents; myocardial infarction   LOWER EXTREMITY ANGIOGRAPHY Left 02/15/2019   Procedure: LOWER EXTREMITY ANGIOGRAPHY;  Surgeon: Katha Cabal, MD;  Location: [facility];  Service: Cardiovascular;  Laterality: Left;   NM MYOVIEW LTD  1/08   EF 39%, no sx ischemia   PACEMAKER PLACEMENT  2/07   Defibrillator   RENAL ARTERY STENT Left     Social History Social History   Tobacco Use   Smoking status: Current Every Day Smoker    Packs/day: 0.50   Smokeless tobacco: Never Used   Tobacco  comment: also uses E-cig  Substance Use Topics   Alcohol use: No    Comment: 2-3 times per month   Drug use: No    Family History Family History  Problem Relation Age of Onset   Stroke Mother 55   Heart attack Father    Coronary artery disease Father    Heart attack Brother    Coronary artery disease Other    Hypertension Other    Cancer Neg Hx        No breast or colon cancer    Allergies  Allergen Reactions   Atorvastatin Other (See Comments)    increased LFT's   Lisinopril Cough   Oxycodone Hcl Itching and Other (See Comments)    Restless & jittery   Simvastatin Other (See Comments)    myalgias     REVIEW OF SYSTEMS (Negative unless checked)  Constitutional: [] Weight loss  [] Fever  [] Chills Cardiac: [] Chest pain   [] Chest pressure   [] Palpitations   [] Shortness of breath when laying flat   [] Shortness of breath with exertion. Vascular:  [x] Pain in legs with walking   [x] Pain in legs at rest  [] History of DVT   [] Phlebitis   [] Swelling in legs   [] Varicose veins   [] Non-healing ulcers Pulmonary:   [] Uses home oxygen   [] Productive cough   [] Hemoptysis   [] Wheeze  [] COPD   [] Asthma Neurologic:  [] Dizziness   [] Seizures   [] History of stroke   [] History of TIA  [] Aphasia   [] Vissual changes   [] Weakness or numbness in arm   [] Weakness or numbness in leg Musculoskeletal:   [] Joint swelling   [] Joint pain   [] Low back pain Hematologic:  [] Easy bruising  [] Easy bleeding   [] Hypercoagulable state   [] Anemic Gastrointestinal:  [] Diarrhea   [] Vomiting  [] Gastroesophageal reflux/heartburn   [] Difficulty swallowing. Genitourinary:  [] Chronic kidney disease   [] Difficult urination  [] Frequent urination   [] Blood in urine Skin:  [] Rashes   [] Ulcers  Psychological:  [] History of anxiety   []  History of major depression.  Physical Examination  Vitals:   02/24/19 1017  BP: 138/80  Pulse: 69  Resp: 16  Weight: 90 lb 3.2 oz (40.9 kg)   Body mass index is 16.5  kg/m. Gen: WD/WN, NAD Head: [place]/AT, No temporalis wasting.  Ear/Nose/Throat: Hearing grossly intact, nares w/o erythema or drainage Eyes: PER, EOMI, sclera nonicteric.  Neck: Supple, no large masses.   Pulmonary:  Good air movement, no audible wheezing bilaterally, no use of accessory muscles.  Cardiac: RRR, no JVD Vascular:  Vessel Right Left  PT Not Palpable Not Palpable  DP Not Palpable Not Palpable  Gastrointestinal: Non-distended. No guarding/no peritoneal signs.  Musculoskeletal: M/S 5/5 throughout.  No deformity or atrophy.  Neurologic: CN 2-12 intact. Symmetrical.  Speech is fluent.  Motor exam as listed above. Psychiatric: Judgment intact, Mood & affect appropriate for pt's clinical situation. Dermatologic: No rashes or ulcers noted.  No changes consistent with cellulitis. Lymph : No lichenification or skin changes of chronic lymphedema.  CBC Lab Results  Component Value Date   WBC 5.9 03/09/2018   HGB 12.8 03/09/2018   HCT 38.0 03/09/2018   MCV 91.5 03/09/2018   PLT 127 (L) 03/09/2018    BMET    Component Value Date/Time   NA 139 03/09/2018 0949   NA 142 05/03/2014 1036   NA 141 01/17/2014 1551   K 5.1 03/09/2018 0949   K 3.6 01/17/2014 1551   CL 103 03/09/2018 0949   CL 107 01/17/2014 1551   CO2 27 03/09/2018 0949   CO2 27 01/17/2014 1551   GLUCOSE 89 03/09/2018 0949   GLUCOSE 86 01/17/2014 1551   BUN 12 02/15/2019 0735   BUN 6 (L) 05/03/2014 1036   BUN 6 (L) 01/17/2014 1551   CREATININE 1.00 02/15/2019 0735   CREATININE 0.94 04/03/2014 1336   CALCIUM 9.6 03/09/2018 0949   CALCIUM 8.5 01/17/2014 1551   GFRNONAA 57 (L) 02/15/2019 0735   GFRNONAA >60 04/03/2014 1336   GFRNONAA >60 01/17/2014 1551   GFRAA >60 02/15/2019 0735   GFRAA >60 04/03/2014 1336   GFRAA >60 01/17/2014 1551   Estimated Creatinine Clearance: 33.3 mL/min (by C-G formula based on SCr of 1 mg/dL).  COAG Lab Results  Component Value Date   INR 1.0 05/03/2014   INR 1.1 01/17/2014     INR CANCELED 04/03/2011    Radiology Vas [place] Abi With/wo Tbi  Result Date: 02/14/2019 LOWER EXTREMITY DOPPLER STUDY Indications: Rest pain.  Performing Technologist: Almira Coaster RVS  Examination Guidelines: A complete evaluation includes at minimum, Doppler waveform signals and systolic blood pressure reading at the level of bilateral brachial, anterior tibial, and posterior tibial arteries, when vessel segments are accessible. Bilateral testing is considered an integral part of a complete examination. Photoelectric Plethysmograph (PPG) waveforms and toe systolic pressure readings are included as required and additional duplex testing as needed. Limited examinations for reoccurring indications may be performed as noted.  ABI Findings: +---------+------------------+-----+----------+--------+  Right     Rt Pressure (mmHg) Index Waveform   Comment   +---------+------------------+-----+----------+--------+  Brachial  135                                           +---------+------------------+-----+----------+--------+  ATA       128                0.95  monophasic           +---------+------------------+-----+----------+--------+  PTA       56                 0.41  monophasic           +---------+------------------+-----+----------+--------+  Great Toe 75                 0.56  Abnormal             +---------+------------------+-----+----------+--------+ +---------+------------------+-----+----------+-------+  Left      Lt Pressure (mmHg) Index Waveform   Comment  +---------+------------------+-----+----------+-------+  ATA       66                 0.49  monophasic          +---------+------------------+-----+----------+-------+  PTA       28                 0.21  monophasic          +---------+------------------+-----+----------+-------+  Great Toe 47                 0.35  Abnormal            +---------+------------------+-----+----------+-------+ +-------+-----------+-----------+------------+------------+   ABI/TBI Today's ABI Today's TBI Previous ABI Previous TBI  +-------+-----------+-----------+------------+------------+  Right   .95         .56                                    +-------+-----------+-----------+------------+------------+  Left    .49         .35                                    +-------+-----------+-----------+------------+------------+ OES Findings: +----------+---------------+--------+-------+  Right Toes Pressure (mmHg) Waveform Comment  +----------+---------------+--------+-------+  1st Digit  8               Abnormal          +----------+---------------+--------+-------+  2nd Digit  2               Abnormal          +----------+---------------+--------+-------+  3rd Digit  0               Abnormal          +----------+---------------+--------+-------+  4th Digit  0               Abnormal          +----------+---------------+--------+-------+  5th Digit  1               Abnormal          +----------+---------------+--------+-------+  +---------+---------------+--------+-------+  Left Toes Pressure (mmHg) Waveform Comment  +---------+---------------+--------+-------+  1st Digit 2               Abnormal          +---------+---------------+--------+-------+  2nd Digit 0               Absent            +---------+---------------+--------+-------+  3rd Digit 0               Absent            +---------+---------------+--------+-------+  4th Digit 0               Absent            +---------+---------------+--------+-------+  5th Digit 0               Absent            +---------+---------------+--------+-------+    Summary: Right: Resting right ankle-brachial index is within normal range. No evidence of significant right lower extremity arterial disease. The right toe-brachial index is abnormal. ABIs are unreliable. Left: Resting left ankle-brachial index indicates severe left lower extremity arterial disease. The left toe-brachial index is abnormal.  *See table(s) above for measurements and  observations.  Electronically signed by Hortencia Pilar MD on 02/14/2019 at 8:19:37 AM.    Final    Vas [place] Lower Extremity Venous Reflux  Result Date: 02/14/2019  Lower Venous Reflux Study Indications: Pain, and Swelling.  Performing Technologist: Almira Coaster RVS  Examination Guidelines: A complete evaluation includes B-mode imaging, spectral Doppler, color Doppler, and power Doppler as needed of all accessible portions of each vessel. Bilateral testing is considered an integral part of a complete examination. Limited examinations for reoccurring indications may be performed as noted. The reflux portion of the exam is performed with the patient in reverse Trendelenburg.  +---------+---------------+---------+-----------+----------+--------------+  LEFT      Compressibility Phasicity Spontaneity Properties Thrombus Aging  +---------+---------------+---------+-----------+----------+--------------+  CFV       Full            Yes       Yes                                    +---------+---------------+---------+-----------+----------+--------------+  SFJ       Full            Yes       Yes                                    +---------+---------------+---------+-----------+----------+--------------+  FV Prox   Full            Yes       Yes                                    +---------+---------------+---------+-----------+----------+--------------+  FV Mid    Full            Yes       Yes                                    +---------+---------------+---------+-----------+----------+--------------+  FV Distal Full            Yes       Yes                                    +---------+---------------+---------+-----------+----------+--------------+  PFV       Full            Yes       Yes                                    +---------+---------------+---------+-----------+----------+--------------+  POP       Full            Yes       Yes                                     +---------+---------------+---------+-----------+----------+--------------+  GSV       Full            Yes       Yes                                    +---------+---------------+---------+-----------+----------+--------------+     Summary: Left:  There is no evidence of deep vein thrombosis in the lower extremity. There is no evidence of superficial venous thrombosis.There is no evidence of chronic venous insufficiency. Ultrasound characteristics of enlarged lymph nodes noted in the groin.  *See table(s) above for measurements and observations. Electronically signed by Hortencia Pilar MD on 02/14/2019 at 8:19:32 AM.    Final      Assessment/Plan 1. Atherosclerosis of native artery of both lower extremities with rest pain ([place]) Recommend:  The patient has evidence of severe atherosclerotic changes of both lower extremities with rest pain that is associated with preulcerative changes and impending tissue loss of the foot.  This represents a limb threatening ischemia and places the patient at the risk for limb loss.  Patient should undergo CT angiography of the lower extremities.  The risks and benefits as well as the alternative therapies was discussed in detail with the patient.  All questions were answered.  Patient agrees to proceed with CT angiography.  The patient will follow up with me in the office after the procedure.   - CT Angio Abd/Pel w/ and/or w/o; Future  2. Essential hypertension Continue antihypertensive medications as already ordered, these medications have been reviewed and there are no changes at this time.   3. Atherosclerosis of native coronary artery of native heart without angina pectoris Continue cardiac and antihypertensive medications as already ordered and reviewed, no changes at this time.  Continue statin as ordered and reviewed, no changes at this time  Nitrates PRN for chest pain   4. Chronic obstructive pulmonary disease, unspecified COPD type ([place]) Continue  pulmonary medications and aerosols as already ordered, these medications have been reviewed and there are no changes at this time.  5. Other hyperlipidemia Continue statin as ordered and reviewed, no changes at this time    Hortencia Pilar, MD  02/24/2019 10:26 AM

## 2019-02-28 ENCOUNTER — Ambulatory Visit (INDEPENDENT_AMBULATORY_CARE_PROVIDER_SITE_OTHER): Payer: Medicare Other | Admitting: Vascular Surgery

## 2019-03-07 ENCOUNTER — Ambulatory Visit (INDEPENDENT_AMBULATORY_CARE_PROVIDER_SITE_OTHER): Payer: Medicare Other | Admitting: Vascular Surgery

## 2019-03-07 ENCOUNTER — Other Ambulatory Visit: Payer: Self-pay

## 2019-03-07 ENCOUNTER — Encounter (INDEPENDENT_AMBULATORY_CARE_PROVIDER_SITE_OTHER): Payer: Self-pay | Admitting: Vascular Surgery

## 2019-03-07 VITALS — BP 122/73 | HR 63 | Resp 16 | Wt 90.6 lb

## 2019-03-07 DIAGNOSIS — I2510 Atherosclerotic heart disease of native coronary artery without angina pectoris: Secondary | ICD-10-CM

## 2019-03-07 DIAGNOSIS — I10 Essential (primary) hypertension: Secondary | ICD-10-CM

## 2019-03-07 DIAGNOSIS — J449 Chronic obstructive pulmonary disease, unspecified: Secondary | ICD-10-CM

## 2019-03-07 DIAGNOSIS — I701 Atherosclerosis of renal artery: Secondary | ICD-10-CM

## 2019-03-07 DIAGNOSIS — I70223 Atherosclerosis of native arteries of extremities with rest pain, bilateral legs: Secondary | ICD-10-CM

## 2019-03-07 NOTE — Progress Notes (Signed)
MRN : [id number]  Christine [name] is a 73 y.o. (1946/03/28) female who presents with chief complaint of  Chief Complaint  Patient presents with   Follow-up    patient has questions for provider  .  History of Present Illness:  The patient returns to the office for followup and review of the noninvasive studies. There has been a significant deterioration in the lower extremity symptoms.  The patient notes interval shortening of their claudication distance and development of mild rest pain symptoms. No new ulcers or wounds have occurred since the last visit.  There have been no significant changes to the patient's overall health care.  The patient denies amaurosis fugax or recent TIA symptoms. There are no recent neurological changes noted. The patient denies history of DVT, PE or superficial thrombophlebitis. The patient denies recent episodes of angina or shortness of breath.    Current Meds  Medication Sig   albuterol (VENTOLIN HFA) 108 (90 Base) MCG/ACT inhaler Inhale 2 puffs into the lungs 4 (four) times daily as needed (wheezing/shortness of breath).    aspirin EC 81 MG tablet Take 81 mg by mouth daily.   bisacodyl (DULCOLAX) 5 MG EC tablet Take 5 mg by mouth daily as needed for mild constipation or moderate constipation.   carvedilol (COREG) 25 MG tablet TAKE 1 TABLET BY MOUTH TWICE A DAY (Patient taking differently: Take 25 mg by mouth 2 (two) times daily. )   Cholecalciferol (VITAMIN D) 2000 UNITS tablet Take 2,000 Units by mouth daily.   clopidogrel (PLAVIX) 75 MG tablet Take 1 tablet (75 mg total) by mouth daily.   Flaxseed, Linseed, (FLAXSEED OIL) 1000 MG CAPS Take 1,000 mg by mouth daily.   furosemide (LASIX) 40 MG tablet Take 40 mg by mouth daily as needed (fluid retention/swelling).   HYDROcodone-acetaminophen (NORCO) 5-325 MG tablet Take 1-2 tablets by mouth every 6 (six) hours as needed for severe pain.   loratadine (CLARITIN) 10 MG tablet Take 10 mg by  mouth daily as needed for allergies.    LORazepam (ATIVAN) 0.5 MG tablet Take 0.25 mg by mouth 2 (two) times daily.    Multiple Vitamin (ANTIOXIDANT FORMULA PO) Take 1 tablet by mouth daily.   Multiple Vitamins-Minerals (HEALTHY EYES/LUTEIN PO) Take 1 tablet by mouth daily.   pantoprazole (PROTONIX) 40 MG tablet Take 40 mg by mouth daily.    Polyethylene Glycol 3350 (PEG 3350) POWD Take 17 g by mouth daily as needed (constipation.).    rosuvastatin (CRESTOR) 10 MG tablet Take 10 mg by mouth daily.    tamoxifen (NOLVADEX) 20 MG tablet Take 1 tablet (20 mg total) by mouth daily. (Patient taking differently: Take 20 mg by mouth daily at 12 noon. )   traMADol (ULTRAM) 50 MG tablet Take 25 mg by mouth 2 (two) times daily.   valsartan (DIOVAN) 320 MG tablet Take 320 mg by mouth daily at 12 noon.     Past Medical History:  Diagnosis Date   Allergic rhinitis    Anxiety    Breast cancer ([place])    Chronic systolic heart failure (HCC)    Coronary artery disease    GERD (gastroesophageal reflux disease)    Heart murmur    Hyperlipidemia    Hypertension    Implantable defibrillator [employer]    DOI 2006   MI (myocardial infarction) [facility])    Osteoarthritis     Past Surgical History:  Procedure Laterality Date   CHF exacerbation  10/07   CORONARY  STENT PLACEMENT  8/06   3 stents; myocardial infarction   LOWER EXTREMITY ANGIOGRAPHY Left 02/15/2019   Procedure: LOWER EXTREMITY ANGIOGRAPHY;  Surgeon: Katha Cabal, MD;  Location: [facility];  Service: Cardiovascular;  Laterality: Left;   NM MYOVIEW LTD  1/08   EF 39%, no sx ischemia   PACEMAKER PLACEMENT  2/07   Defibrillator   RENAL ARTERY STENT Left     Social History Social History   Tobacco Use   Smoking status: Current Every Day Smoker    Packs/day: 0.50   Smokeless tobacco: Never Used   Tobacco comment: also uses E-cig  Substance Use Topics   Alcohol use: No    Comment: 2-3 times per  month   Drug use: No    Family History Family History  Problem Relation Age of Onset   Stroke Mother 60   Heart attack Father    Coronary artery disease Father    Heart attack Brother    Coronary artery disease Other    Hypertension Other    Cancer Neg Hx        No breast or colon cancer    Allergies  Allergen Reactions   Atorvastatin Other (See Comments)    increased LFT's   Lisinopril Cough   Oxycodone Hcl Itching and Other (See Comments)    Restless & jittery   Simvastatin Other (See Comments)    myalgias     REVIEW OF SYSTEMS (Negative unless checked)  Constitutional: [] Weight loss  [] Fever  [] Chills Cardiac: [] Chest pain   [] Chest pressure   [] Palpitations   [] Shortness of breath when laying flat   [] Shortness of breath with exertion. Vascular:  [x] Pain in legs with walking   [x] Pain in legs at rest  [] History of DVT   [] Phlebitis   [] Swelling in legs   [] Varicose veins   [] Non-healing ulcers Pulmonary:   [] Uses home oxygen   [] Productive cough   [] Hemoptysis   [] Wheeze  [] COPD   [] Asthma Neurologic:  [] Dizziness   [] Seizures   [] History of stroke   [] History of TIA  [] Aphasia   [] Vissual changes   [] Weakness or numbness in arm   [] Weakness or numbness in leg Musculoskeletal:   [] Joint swelling   [] Joint pain   [] Low back pain Hematologic:  [] Easy bruising  [] Easy bleeding   [] Hypercoagulable state   [] Anemic Gastrointestinal:  [] Diarrhea   [] Vomiting  [] Gastroesophageal reflux/heartburn   [] Difficulty swallowing. Genitourinary:  [] Chronic kidney disease   [] Difficult urination  [] Frequent urination   [] Blood in urine Skin:  [] Rashes   [] Ulcers  Psychological:  [] History of anxiety   []  History of major depression.  Physical Examination  Vitals:   03/07/19 0929  BP: 122/73  Pulse: 63  Resp: 16  Weight: 90 lb 9.6 oz (41.1 kg)   Body mass index is 16.57 kg/m. Gen: WD/WN, NAD Head: [place]/AT, No temporalis wasting.  Ear/Nose/Throat: Hearing grossly  intact, nares w/o erythema or drainage Eyes: PER, EOMI, sclera nonicteric.  Neck: Supple, no large masses.   Pulmonary:  Good air movement, no audible wheezing bilaterally, no use of accessory muscles.  Cardiac: RRR, no JVD Vascular:  Vessel Right Left  Radial Palpable Palpable  PT Not Palpable Not Palpable  DP Not Palpable Not Palpable  Gastrointestinal: Non-distended. No guarding/no peritoneal signs.  Musculoskeletal: M/S 5/5 throughout.  No deformity or atrophy.  Neurologic: CN 2-12 intact. Symmetrical.  Speech is fluent. Motor exam as listed above. Psychiatric: Judgment intact, Mood & affect appropriate  for pt's clinical situation. Dermatologic: No rashes or ulcers noted.  No changes consistent with cellulitis. Lymph : No lichenification or skin changes of chronic lymphedema.  CBC Lab Results  Component Value Date   WBC 5.9 03/09/2018   HGB 12.8 03/09/2018   HCT 38.0 03/09/2018   MCV 91.5 03/09/2018   PLT 127 (L) 03/09/2018    BMET    Component Value Date/Time   NA 139 03/09/2018 0949   NA 142 05/03/2014 1036   NA 141 01/17/2014 1551   K 5.1 03/09/2018 0949   K 3.6 01/17/2014 1551   CL 103 03/09/2018 0949   CL 107 01/17/2014 1551   CO2 27 03/09/2018 0949   CO2 27 01/17/2014 1551   GLUCOSE 89 03/09/2018 0949   GLUCOSE 86 01/17/2014 1551   BUN 12 02/15/2019 0735   BUN 6 (L) 05/03/2014 1036   BUN 6 (L) 01/17/2014 1551   CREATININE 1.00 02/15/2019 0735   CREATININE 0.94 04/03/2014 1336   CALCIUM 9.6 03/09/2018 0949   CALCIUM 8.5 01/17/2014 1551   GFRNONAA 57 (L) 02/15/2019 0735   GFRNONAA >60 04/03/2014 1336   GFRNONAA >60 01/17/2014 1551   GFRAA >60 02/15/2019 0735   GFRAA >60 04/03/2014 1336   GFRAA >60 01/17/2014 1551   Estimated Creatinine Clearance: 33.5 mL/min (by C-G formula based on SCr of 1 mg/dL).  COAG Lab Results  Component Value Date   INR 1.0 05/03/2014   INR 1.1 01/17/2014   INR CANCELED 04/03/2011    Radiology Vas [place] Abi With/wo  Tbi  Result Date: 02/14/2019 LOWER EXTREMITY DOPPLER STUDY Indications: Rest pain.  Performing Technologist: Almira Coaster RVS  Examination Guidelines: A complete evaluation includes at minimum, Doppler waveform signals and systolic blood pressure reading at the level of bilateral brachial, anterior tibial, and posterior tibial arteries, when vessel segments are accessible. Bilateral testing is considered an integral part of a complete examination. Photoelectric Plethysmograph (PPG) waveforms and toe systolic pressure readings are included as required and additional duplex testing as needed. Limited examinations for reoccurring indications may be performed as noted.  ABI Findings: +---------+------------------+-----+----------+--------+  Right     Rt Pressure (mmHg) Index Waveform   Comment   +---------+------------------+-----+----------+--------+  Brachial  135                                           +---------+------------------+-----+----------+--------+  ATA       128                0.95  monophasic           +---------+------------------+-----+----------+--------+  PTA       56                 0.41  monophasic           +---------+------------------+-----+----------+--------+  Great Toe 75                 0.56  Abnormal             +---------+------------------+-----+----------+--------+ +---------+------------------+-----+----------+-------+  Left      Lt Pressure (mmHg) Index Waveform   Comment  +---------+------------------+-----+----------+-------+  ATA       66                 0.49  monophasic          +---------+------------------+-----+----------+-------+  PTA  28                 0.21  monophasic          +---------+------------------+-----+----------+-------+  Great Toe 47                 0.35  Abnormal            +---------+------------------+-----+----------+-------+ +-------+-----------+-----------+------------+------------+  ABI/TBI Today's ABI Today's TBI Previous ABI Previous TBI   +-------+-----------+-----------+------------+------------+  Right   .95         .56                                    +-------+-----------+-----------+------------+------------+  Left    .49         .35                                    +-------+-----------+-----------+------------+------------+ OES Findings: +----------+---------------+--------+-------+  Right Toes Pressure (mmHg) Waveform Comment  +----------+---------------+--------+-------+  1st Digit  8               Abnormal          +----------+---------------+--------+-------+  2nd Digit  2               Abnormal          +----------+---------------+--------+-------+  3rd Digit  0               Abnormal          +----------+---------------+--------+-------+  4th Digit  0               Abnormal          +----------+---------------+--------+-------+  5th Digit  1               Abnormal          +----------+---------------+--------+-------+  +---------+---------------+--------+-------+  Left Toes Pressure (mmHg) Waveform Comment  +---------+---------------+--------+-------+  1st Digit 2               Abnormal          +---------+---------------+--------+-------+  2nd Digit 0               Absent            +---------+---------------+--------+-------+  3rd Digit 0               Absent            +---------+---------------+--------+-------+  4th Digit 0               Absent            +---------+---------------+--------+-------+  5th Digit 0               Absent            +---------+---------------+--------+-------+    Summary: Right: Resting right ankle-brachial index is within normal range. No evidence of significant right lower extremity arterial disease. The right toe-brachial index is abnormal. ABIs are unreliable. Left: Resting left ankle-brachial index indicates severe left lower extremity arterial disease. The left toe-brachial index is abnormal.  *See table(s) above for measurements and observations.  Electronically signed by Hortencia Pilar MD on  02/14/2019 at 8:19:37 AM.    Final    Vas [place] Lower Extremity Venous Reflux  Result Date: 02/14/2019  Lower Venous Reflux Study Indications: Pain,  and Swelling.  Performing Technologist: Almira Coaster RVS  Examination Guidelines: A complete evaluation includes B-mode imaging, spectral Doppler, color Doppler, and power Doppler as needed of all accessible portions of each vessel. Bilateral testing is considered an integral part of a complete examination. Limited examinations for reoccurring indications may be performed as noted. The reflux portion of the exam is performed with the patient in reverse Trendelenburg.  +---------+---------------+---------+-----------+----------+--------------+  LEFT      Compressibility Phasicity Spontaneity Properties Thrombus Aging  +---------+---------------+---------+-----------+----------+--------------+  CFV       Full            Yes       Yes                                    +---------+---------------+---------+-----------+----------+--------------+  SFJ       Full            Yes       Yes                                    +---------+---------------+---------+-----------+----------+--------------+  FV Prox   Full            Yes       Yes                                    +---------+---------------+---------+-----------+----------+--------------+  FV Mid    Full            Yes       Yes                                    +---------+---------------+---------+-----------+----------+--------------+  FV Distal Full            Yes       Yes                                    +---------+---------------+---------+-----------+----------+--------------+  PFV       Full            Yes       Yes                                    +---------+---------------+---------+-----------+----------+--------------+  POP       Full            Yes       Yes                                    +---------+---------------+---------+-----------+----------+--------------+  GSV       Full            Yes        Yes                                    +---------+---------------+---------+-----------+----------+--------------+     Summary: Left: There is no evidence of deep  vein thrombosis in the lower extremity. There is no evidence of superficial venous thrombosis.There is no evidence of chronic venous insufficiency. Ultrasound characteristics of enlarged lymph nodes noted in the groin.  *See table(s) above for measurements and observations. Electronically signed by Hortencia Pilar MD on 02/14/2019 at 8:19:32 AM.    Final      Assessment/Plan 1. Atherosclerosis of native artery of both lower extremities with rest pain ([place]) Recommend:  The patient is status post angiogram.  The patient reports that the claudication symptoms and leg rest pain are unchanged and continue to be a major issue.   The patient continues to voice lifestyle limiting changes at this point in time.  Patient should undergo noninvasive studies as ordered. The patient will follow up with me after the studies.   In the mean time the patient should continue walking and an exercise program.  The patient should continue antiplatelet therapy and aggressive treatment of the lipid abnormalities  Smoking cessation was again discussed  The patient should continue wearing graduated compression socks 10-15 mmHg strength to control the mild edema.    2. Renal artery stenosis (HCC) continue to monitor hypertension and renal function  3. Essential hypertension Continue antihypertensive medications as already ordered, these medications have been reviewed and there are no changes at this time.   4. Atherosclerosis of native coronary artery of native heart without angina pectoris Continue cardiac and antihypertensive medications as already ordered and reviewed, no changes at this time.  Continue statin as ordered and reviewed, no changes at this time  Nitrates PRN for chest pain   5. Chronic obstructive pulmonary disease,  unspecified COPD type ([place]) Continue pulmonary medications and aerosols as already ordered, these medications have been reviewed and there are no changes at this time.     Hortencia Pilar, MD  03/07/2019 9:34 AM

## 2019-03-08 ENCOUNTER — Other Ambulatory Visit: Payer: Self-pay

## 2019-03-08 ENCOUNTER — Ambulatory Visit
Admission: RE | Admit: 2019-03-08 | Discharge: 2019-03-08 | Disposition: A | Discharge status: Home / Self Care | Payer: Medicare Other | Source: Ambulatory Visit | Attending: Vascular Surgery | Admitting: Vascular Surgery

## 2019-03-08 DIAGNOSIS — I70223 Atherosclerosis of native arteries of extremities with rest pain, bilateral legs: Secondary | ICD-10-CM | POA: Diagnosis not present

## 2019-03-08 MED ORDER — IOHEXOL 350 MG/ML SOLN
75.0000 mL | Freq: Once | INTRAVENOUS | Status: AC | PRN
  Administered 2019-03-08: 75 mL via INTRAVENOUS

## 2019-03-10 ENCOUNTER — Other Ambulatory Visit: Payer: Self-pay

## 2019-03-10 ENCOUNTER — Encounter (INDEPENDENT_AMBULATORY_CARE_PROVIDER_SITE_OTHER): Payer: Self-pay | Admitting: Vascular Surgery

## 2019-03-10 ENCOUNTER — Ambulatory Visit (INDEPENDENT_AMBULATORY_CARE_PROVIDER_SITE_OTHER): Payer: Medicare Other | Admitting: Vascular Surgery

## 2019-03-10 VITALS — BP 135/72 | HR 62 | Resp 10 | Ht 62.0 in | Wt 91.0 lb

## 2019-03-10 DIAGNOSIS — I10 Essential (primary) hypertension: Secondary | ICD-10-CM

## 2019-03-10 DIAGNOSIS — I714 Abdominal aortic aneurysm, without rupture, unspecified: Secondary | ICD-10-CM

## 2019-03-10 DIAGNOSIS — I2510 Atherosclerotic heart disease of native coronary artery without angina pectoris: Secondary | ICD-10-CM

## 2019-03-10 DIAGNOSIS — I70223 Atherosclerosis of native arteries of extremities with rest pain, bilateral legs: Secondary | ICD-10-CM

## 2019-03-10 DIAGNOSIS — I701 Atherosclerosis of renal artery: Secondary | ICD-10-CM

## 2019-03-10 NOTE — Progress Notes (Signed)
MRN : [id number]  Christine [name] is a 73 y.o. (January 15, 1946) female who presents with chief complaint of  Chief Complaint  Patient presents with   Follow-up  .  History of Present Illness:   The patient is seen for follow up evaluation of atherosclerosis with rest pain in association with an AAA.  She is status post CTA. There were no problems or complications related to the CT scan. The patient denies interval development of abdominal or back pain. No new discoloration of the toes.   The patient has a history of coronary artery disease, no recent episodes of angina or shortness of breath. The patient denies interval anaurosis fugax. There is o recent history of TIA symptoms or focal motor deficits. The patient denies PAD or claudication symptoms. There is a history of hyperlipidemia which is being treated with a statin.   CT angiography of the abdomen and pelvis shows an infrarenal AAA 3.4 cm associated with multiple >70% stenosis.  Current Meds  Medication Sig   albuterol (VENTOLIN HFA) 108 (90 Base) MCG/ACT inhaler Inhale 2 puffs into the lungs 4 (four) times daily as needed (wheezing/shortness of breath).    aspirin EC 81 MG tablet Take 81 mg by mouth daily.   bisacodyl (DULCOLAX) 5 MG EC tablet Take 5 mg by mouth daily as needed for mild constipation or moderate constipation.   carvedilol (COREG) 25 MG tablet TAKE 1 TABLET BY MOUTH TWICE A DAY (Patient taking differently: Take 25 mg by mouth 2 (two) times daily. )   Cholecalciferol (VITAMIN D) 2000 UNITS tablet Take 2,000 Units by mouth daily.   clopidogrel (PLAVIX) 75 MG tablet Take 1 tablet (75 mg total) by mouth daily.   dicyclomine (BENTYL) 20 MG tablet Take by mouth.   Flaxseed, Linseed, (FLAXSEED OIL) 1000 MG CAPS Take 1,000 mg by mouth daily.   furosemide (LASIX) 40 MG tablet Take 40 mg by mouth daily as needed (fluid retention/swelling).   HYDROcodone-acetaminophen (NORCO) 5-325 MG tablet Take 1-2 tablets by mouth  every 6 (six) hours as needed for severe pain.   loratadine (CLARITIN) 10 MG tablet Take 10 mg by mouth daily as needed for allergies.    LORazepam (ATIVAN) 0.5 MG tablet Take 0.25 mg by mouth 2 (two) times daily.    Multiple Vitamin (ANTIOXIDANT FORMULA PO) Take 1 tablet by mouth daily.   Multiple Vitamins-Minerals (HEALTHY EYES/LUTEIN PO) Take 1 tablet by mouth daily.   pantoprazole (PROTONIX) 40 MG tablet Take 40 mg by mouth daily.    Polyethylene Glycol 3350 (PEG 3350) POWD Take 17 g by mouth daily as needed (constipation.).    rosuvastatin (CRESTOR) 10 MG tablet Take 10 mg by mouth daily.    tamoxifen (NOLVADEX) 20 MG tablet Take 1 tablet (20 mg total) by mouth daily. (Patient taking differently: Take 20 mg by mouth daily at 12 noon. )   tetrahydrozoline 0.05 % ophthalmic solution Place 1 drop into both eyes 3 (three) times daily as needed (dry/irritated eyes).   traMADol (ULTRAM) 50 MG tablet Take 25 mg by mouth 2 (two) times daily.   valsartan (DIOVAN) 320 MG tablet Take 320 mg by mouth daily at 12 noon.     Past Medical History:  Diagnosis Date   Allergic rhinitis    Anxiety    Breast cancer (HCC)    Chronic systolic heart failure (HCC)    Coronary artery disease    GERD (gastroesophageal reflux disease)    Heart murmur  Hyperlipidemia    Hypertension    Implantable defibrillator [employer] Jude    DOI 2006   MI (myocardial infarction) [facility])    Osteoarthritis     Past Surgical History:  Procedure Laterality Date   CHF exacerbation  10/07   CORONARY STENT PLACEMENT  8/06   3 stents; myocardial infarction   LOWER EXTREMITY ANGIOGRAPHY Left 02/15/2019   Procedure: LOWER EXTREMITY ANGIOGRAPHY;  Surgeon: Katha Cabal, MD;  Location: [facility];  Service: Cardiovascular;  Laterality: Left;   NM MYOVIEW LTD  1/08   EF 39%, no sx ischemia   PACEMAKER PLACEMENT  2/07   Defibrillator   RENAL ARTERY STENT Left     Social History Social  History   Tobacco Use   Smoking status: Current Every Day Smoker    Packs/day: 0.50   Smokeless tobacco: Never Used   Tobacco comment: also uses E-cig  Substance Use Topics   Alcohol use: No    Comment: 2-3 times per month   Drug use: No    Family History Family History  Problem Relation Age of Onset   Stroke Mother 67   Heart attack Father    Coronary artery disease Father    Heart attack Brother    Coronary artery disease Other    Hypertension Other    Cancer Neg Hx        No breast or colon cancer    Allergies  Allergen Reactions   Atorvastatin Other (See Comments)    increased LFT's   Lisinopril Cough   Oxycodone Hcl Itching and Other (See Comments)    Restless & jittery   Simvastatin Other (See Comments)    myalgias     REVIEW OF SYSTEMS (Negative unless checked)  Constitutional: [] Weight loss  [] Fever  [] Chills Cardiac: [] Chest pain   [] Chest pressure   [] Palpitations   [] Shortness of breath when laying flat   [] Shortness of breath with exertion. Vascular:  [x] Pain in legs with walking   [x] Pain in legs at rest  [] History of DVT   [] Phlebitis   [] Swelling in legs   [] Varicose veins   [] Non-healing ulcers Pulmonary:   [] Uses home oxygen   [] Productive cough   [] Hemoptysis   [] Wheeze  [] COPD   [] Asthma Neurologic:  [] Dizziness   [] Seizures   [] History of stroke   [] History of TIA  [] Aphasia   [] Vissual changes   [] Weakness or numbness in arm   [] Weakness or numbness in leg Musculoskeletal:   [] Joint swelling   [] Joint pain   [] Low back pain Hematologic:  [] Easy bruising  [] Easy bleeding   [] Hypercoagulable state   [] Anemic Gastrointestinal:  [] Diarrhea   [] Vomiting  [] Gastroesophageal reflux/heartburn   [] Difficulty swallowing. Genitourinary:  [x] Chronic kidney disease   [] Difficult urination  [] Frequent urination   [] Blood in urine Skin:  [] Rashes   [] Ulcers  Psychological:  [] History of anxiety   []  History of major depression.  Physical  Examination  Vitals:   03/10/19 0913  BP: 135/72  Pulse: 62  Resp: 10  Weight: 91 lb (41.3 kg)  Height: 5\' 2"  (1.575 m)   Body mass index is 16.64 kg/m. Gen: WD/WN, NAD Head: [place]/AT, No temporalis wasting.  Ear/Nose/Throat: Hearing grossly intact, nares w/o erythema or drainage Eyes: PER, EOMI, sclera nonicteric.  Neck: Supple, no large masses.   Pulmonary:  Good air movement, no audible wheezing bilaterally, no use of accessory muscles.  Cardiac: RRR, no JVD Vascular: both feet are cool to touch and there  is >3 second cap refill Vessel Right Left  PT Not Palpable Not Palpable  DP Not Palpable Not Palpable  Gastrointestinal: Non-distended. No guarding/no peritoneal signs.  Musculoskeletal: M/S 5/5 throughout.  No deformity or atrophy.  Neurologic: CN 2-12 intact. Symmetrical.  Speech is fluent. Motor exam as listed above. Psychiatric: Judgment intact, Mood & affect appropriate for pt's clinical situation. Dermatologic: No rashes or ulcers noted.  No changes consistent with cellulitis. Lymph : No lichenification or skin changes of chronic lymphedema.  CBC Lab Results  Component Value Date   WBC 5.9 03/09/2018   HGB 12.8 03/09/2018   HCT 38.0 03/09/2018   MCV 91.5 03/09/2018   PLT 127 (L) 03/09/2018    BMET    Component Value Date/Time   NA 139 03/09/2018 0949   NA 142 05/03/2014 1036   NA 141 01/17/2014 1551   K 5.1 03/09/2018 0949   K 3.6 01/17/2014 1551   CL 103 03/09/2018 0949   CL 107 01/17/2014 1551   CO2 27 03/09/2018 0949   CO2 27 01/17/2014 1551   GLUCOSE 89 03/09/2018 0949   GLUCOSE 86 01/17/2014 1551   BUN 12 02/15/2019 0735   BUN 6 (L) 05/03/2014 1036   BUN 6 (L) 01/17/2014 1551   CREATININE 1.00 02/15/2019 0735   CREATININE 0.94 04/03/2014 1336   CALCIUM 9.6 03/09/2018 0949   CALCIUM 8.5 01/17/2014 1551   GFRNONAA 57 (L) 02/15/2019 0735   GFRNONAA >60 04/03/2014 1336   GFRNONAA >60 01/17/2014 1551   GFRAA >60 02/15/2019 0735   GFRAA >60  04/03/2014 1336   GFRAA >60 01/17/2014 1551   CrCl cannot be calculated (Patient's most recent lab result is older than the maximum 21 days allowed.).  COAG Lab Results  Component Value Date   INR 1.0 05/03/2014   INR 1.1 01/17/2014   INR CANCELED 04/03/2011    Radiology Vas [place] Abi With/wo Tbi  Result Date: 02/14/2019 LOWER EXTREMITY DOPPLER STUDY Indications: Rest pain.  Performing Technologist: Almira Coaster RVS  Examination Guidelines: A complete evaluation includes at minimum, Doppler waveform signals and systolic blood pressure reading at the level of bilateral brachial, anterior tibial, and posterior tibial arteries, when vessel segments are accessible. Bilateral testing is considered an integral part of a complete examination. Photoelectric Plethysmograph (PPG) waveforms and toe systolic pressure readings are included as required and additional duplex testing as needed. Limited examinations for reoccurring indications may be performed as noted.  ABI Findings: +---------+------------------+-----+----------+--------+  Right     Rt Pressure (mmHg) Index Waveform   Comment   +---------+------------------+-----+----------+--------+  Brachial  135                                           +---------+------------------+-----+----------+--------+  ATA       128                0.95  monophasic           +---------+------------------+-----+----------+--------+  PTA       56                 0.41  monophasic           +---------+------------------+-----+----------+--------+  Great Toe 75                 0.56  Abnormal             +---------+------------------+-----+----------+--------+ +---------+------------------+-----+----------+-------+  Left      Lt Pressure (mmHg) Index Waveform   Comment  +---------+------------------+-----+----------+-------+  ATA       66                 0.49  monophasic          +---------+------------------+-----+----------+-------+  PTA       28                 0.21   monophasic          +---------+------------------+-----+----------+-------+  Great Toe 47                 0.35  Abnormal            +---------+------------------+-----+----------+-------+ +-------+-----------+-----------+------------+------------+  ABI/TBI Today's ABI Today's TBI Previous ABI Previous TBI  +-------+-----------+-----------+------------+------------+  Right   .95         .56                                    +-------+-----------+-----------+------------+------------+  Left    .49         .35                                    +-------+-----------+-----------+------------+------------+ OES Findings: +----------+---------------+--------+-------+  Right Toes Pressure (mmHg) Waveform Comment  +----------+---------------+--------+-------+  1st Digit  8               Abnormal          +----------+---------------+--------+-------+  2nd Digit  2               Abnormal          +----------+---------------+--------+-------+  3rd Digit  0               Abnormal          +----------+---------------+--------+-------+  4th Digit  0               Abnormal          +----------+---------------+--------+-------+  5th Digit  1               Abnormal          +----------+---------------+--------+-------+  +---------+---------------+--------+-------+  Left Toes Pressure (mmHg) Waveform Comment  +---------+---------------+--------+-------+  1st Digit 2               Abnormal          +---------+---------------+--------+-------+  2nd Digit 0               Absent            +---------+---------------+--------+-------+  3rd Digit 0               Absent            +---------+---------------+--------+-------+  4th Digit 0               Absent            +---------+---------------+--------+-------+  5th Digit 0               Absent            +---------+---------------+--------+-------+    Summary: Right: Resting right ankle-brachial index is within normal range. No evidence of significant right lower extremity arterial  disease. The right toe-brachial index is abnormal. ABIs are unreliable.  Left: Resting left ankle-brachial index indicates severe left lower extremity arterial disease. The left toe-brachial index is abnormal.  *See table(s) above for measurements and observations.  Electronically signed by Hortencia Pilar MD on 02/14/2019 at 8:19:37 AM.    Final    Vas [place] Lower Extremity Venous Reflux  Result Date: 02/14/2019  Lower Venous Reflux Study Indications: Pain, and Swelling.  Performing Technologist: Almira Coaster RVS  Examination Guidelines: A complete evaluation includes B-mode imaging, spectral Doppler, color Doppler, and power Doppler as needed of all accessible portions of each vessel. Bilateral testing is considered an integral part of a complete examination. Limited examinations for reoccurring indications may be performed as noted. The reflux portion of the exam is performed with the patient in reverse Trendelenburg.  +---------+---------------+---------+-----------+----------+--------------+  LEFT      Compressibility Phasicity Spontaneity Properties Thrombus Aging  +---------+---------------+---------+-----------+----------+--------------+  CFV       Full            Yes       Yes                                    +---------+---------------+---------+-----------+----------+--------------+  SFJ       Full            Yes       Yes                                    +---------+---------------+---------+-----------+----------+--------------+  FV Prox   Full            Yes       Yes                                    +---------+---------------+---------+-----------+----------+--------------+  FV Mid    Full            Yes       Yes                                    +---------+---------------+---------+-----------+----------+--------------+  FV Distal Full            Yes       Yes                                    +---------+---------------+---------+-----------+----------+--------------+  PFV       Full             Yes       Yes                                    +---------+---------------+---------+-----------+----------+--------------+  POP       Full            Yes       Yes                                    +---------+---------------+---------+-----------+----------+--------------+  GSV       Full  Yes       Yes                                    +---------+---------------+---------+-----------+----------+--------------+     Summary: Left: There is no evidence of deep vein thrombosis in the lower extremity. There is no evidence of superficial venous thrombosis.There is no evidence of chronic venous insufficiency. Ultrasound characteristics of enlarged lymph nodes noted in the groin.  *See table(s) above for measurements and observations. Electronically signed by Hortencia Pilar MD on 02/14/2019 at 8:19:32 AM.    Final    Ct Angio Abd/pel W/ And/or W/o  Result Date: 03/08/2019 CLINICAL DATA:  History of peripheral vascular disease with bilateral lower extremity claudication. Known aortoiliac atherosclerosis by prior arteriography. EXAM: CT ANGIOGRAPHY ABDOMEN AND PELVIS WITH CONTRAST AND WITHOUT CONTRAST TECHNIQUE: Multidetector CT imaging of the abdomen and pelvis was performed using the standard protocol during bolus administration of intravenous contrast. Multiplanar reconstructed images and MIPs were obtained and reviewed to evaluate the vascular anatomy. CONTRAST:  49mL OMNIPAQUE IOHEXOL 350 MG/ML SOLN COMPARISON:  CT of the chest, abdomen and pelvis on 09/29/2011 FINDINGS: VASCULAR Aorta: The aorta demonstrates severe atherosclerosis. Above the diaphragmatic hiatus the distal thoracic aorta reaches maximal diameter of approximately 3.1 cm and demonstrates posterior mural thrombus. The proximal abdominal aorta just above the celiac origin demonstrates irregular shape and measures approximately 2.8 x 3.4 cm. Circumferential mural thrombus and noncalcified plaque present. Below the renal arteries and  above the level of the IMA origin, irregular calcified plaque is present as well as tortuosity of the aortic lumen. There is some ulcerated plaque present at this level and aortic stenosis of approximately 60-70% focally. The aorta then bulges slightly to maximal diameter of approximately 2.1 cm at the level of the IMA origin. The distal aorta is heavily calcified and open. No evidence of aortic dissection. Celiac: Atherosclerosis causes approximately 75% irregular stenosis at the level of the proximal celiac axis. SMA: Normally patent origin. Calcified and noncalcified plaque in the trunk of the SMA causes maximal 30% stenosis. Renals: Patent proximal right renal artery stent present which appears in appropriate position. Distal right renal artery branches are normally patent. A single left renal artery is diffusely small in caliber and demonstrates high-grade disease at its origin with likely greater than 90% stenosis/subtotal occlusive disease present. IMA: The IMA appears open with likely stenosis at its origin which is difficult to accurately grade. Inflow: Heavily calcified plaque at the origin of the right common iliac artery causes approximately 50-70% stenosis. Diffuse calcified and partially ulcerated plaque present throughout the common iliac artery without high-grade stenosis. The right external iliac artery shows diffuse calcified plaque with maximal stenosis of approximately 70% throughout much of its proximal and mid segment. The distal segment is more open. The left common iliac artery demonstrates diffuse disease with predominately calcified plaque but also noncalcified plaque. Proximal common iliac artery demonstrates stenosis of approximately 75% narrowing. Milder disease is present in the rest of the common iliac artery. The left external iliac artery demonstrates diffuse disease with maximal stenosis of approximately 70% in its mid segment. There is a mid to distal stent present in the external  iliac artery extending to the inguinal ligament and demonstrating normal patency. Proximal Outflow: Common femoral arteries are patent bilaterally in demonstrate diffuse atherosclerosis. Femoral bifurcations are patent bilaterally. There is significant disease at the origin of the left SFA  with approximately 75-90% stenosis extending into the proximal SFA. Review of the MIP images confirms the above findings. NON-VASCULAR Lower chest: No acute abnormality. Hepatobiliary: Gallstones and high density biliary sludge present in the gallbladder lumen. The liver is unremarkable. No biliary ductal dilatation identified. Pancreas: Unremarkable. No pancreatic ductal dilatation or surrounding inflammatory changes. Spleen: Normal in size without focal abnormality. Adrenals/Urinary Tract: On axial images the left kidney is clearly atrophic compared to the right with smaller overall volume and evidence of cortical atrophy. This is felt to be likely on the basis of the high-grade occlusive disease of the left renal artery with likely chronic subtotal occlusion. The left kidney remains perfused with open arterial branches identified. Delayed imaging also demonstrates excretion of contrast in the left renal collecting system. The bladder is moderately distended. Stomach/Bowel: Visualized bowel shows no evidence of obstruction, ileus or inflammation. No free air identified. No obvious bowel lesions without oral contrast administered. Lymphatic: No enlarged lymph nodes identified in the abdomen or pelvis. Reproductive: Uterus and bilateral adnexa are unremarkable. Other: There is a small amount of free fluid in the dependent pelvis. No hernias are identified. Musculoskeletal: Advanced degenerative disc disease present at L4-5 with associated anterolisthesis of L4 on L5 of approximately 10 mm. IMPRESSION: VASCULAR 1. Severe aortic atherosclerosis. Mild aneurysmal dilatation of the lower descending thoracic aorta extending into the  proximal abdominal aorta with maximal diameter of 3.4 cm. 2. Mid aortic stenosis caused by predominately calcified plaque between the level of the renal arteries and IMA. Degree of narrowing is likely in the 60-70% range by CTA. 3. Right iliac arteries demonstrate at least moderate disease of the common iliac artery and more significant disease of the proximal to mid external iliac artery. 4. Left iliac arteries demonstrate 75% proximal stenosis of the left common iliac artery and diffuse disease of the external iliac artery reaching roughly 70% in maximal stenosis up to the point of a patent mid to distal external iliac artery stent. 5. Significant disease of the proximal left SFA. 6. Open previously placed proximal right renal artery stent. 7. Severe disease of the proximal right renal artery with likely critical narrowing and potentially subtotal occlusive disease which results and small caliber appearance of the artery and chronic atrophy of the left kidney relative to the right. NON-VASCULAR 1. Cholelithiasis with gallstones and biliary sludge in the gallbladder. 2. Small amount of free fluid in the pelvis. 3. Degenerative disc disease at L4-5 with associated anterolisthesis of L4 on L5 of approximately 10 mm. Electronically Signed   By: Aletta Edouard M.D.   On: 03/08/2019 14:22     Assessment/Plan 1. AAA (abdominal aortic aneurysm) without rupture ([place]) Recommend: The aneurysm is associated with ASO and rest pain and therefore should undergo repair. Patient is status post CT scan of the abdominal aorta. The patient is a candidate for endovascular repair.   The patient will continue antiplatelet therapy as prescribed (since the patient is undergoing endovascular repair as opposed to open repair) as well as aggressive management of hyperlipidemia. Exercise is again strongly encouraged.   The patient is reminded that lifetime routine surveillance is a necessity with an endograft.   The risks and  benefits of AAA repair are reviewed with the patient.  All questions are answered.  Alternative therapies are also discussed.  The patient agrees to proceed with endovascular aneurysm repair.   A total of 35 minutes was spent with this patient and greater than 50% was spent in counseling and coordination  of care with the patient.  Discussion included the treatment options for vascular disease including indications for surgery and intervention.  Also discussed is the appropriate timing of treatment.  In addition medical therapy was discussed.  Patient will follow-up with me in the office after the surgery.  2. Atherosclerosis of native artery of both lower extremities with rest pain ([place]) See #1  3. Renal artery stenosis (HCC) Continue to monitor renal function and her blood pressure  4. Atherosclerosis of native coronary artery of native heart without angina pectoris Continue cardiac and antihypertensive medications as already ordered and reviewed, no changes at this time.  Continue statin as ordered and reviewed, no changes at this time  Nitrates PRN for chest pain   5. Essential hypertension Continue antihypertensive medications as already ordered, these medications have been reviewed and there are no changes at this time.    Hortencia Pilar, MD  03/10/2019 9:28 AM

## 2019-03-13 DIAGNOSIS — I714 Abdominal aortic aneurysm, without rupture, unspecified: Secondary | ICD-10-CM | POA: Insufficient documentation

## 2019-03-13 DIAGNOSIS — I701 Atherosclerosis of renal artery: Secondary | ICD-10-CM | POA: Insufficient documentation

## 2019-03-15 ENCOUNTER — Telehealth (INDEPENDENT_AMBULATORY_CARE_PROVIDER_SITE_OTHER): Payer: Self-pay

## 2019-03-15 ENCOUNTER — Telehealth (INDEPENDENT_AMBULATORY_CARE_PROVIDER_SITE_OTHER): Payer: Self-pay | Admitting: Vascular Surgery

## 2019-03-15 ENCOUNTER — Other Ambulatory Visit (INDEPENDENT_AMBULATORY_CARE_PROVIDER_SITE_OTHER): Payer: Self-pay | Admitting: Nurse Practitioner

## 2019-03-15 MED ORDER — HYDROCODONE-ACETAMINOPHEN 5-325 MG PO TABS: 1.0000 | ORAL_TABLET | Freq: Four times a day (QID) | ORAL | 0 refills | Status: DC | PRN

## 2019-03-15 NOTE — Telephone Encounter (Signed)
Patient returned my call and was given the information regarding her appt with Dr. Clayborn Bigness at [facility] for cardiac clearance for surgery. Patient's appt is on 03/17/2019 @ 3:15 pm with Dr. Clayborn Bigness.

## 2019-03-15 NOTE — Telephone Encounter (Signed)
It has been sent!

## 2019-03-15 NOTE — Telephone Encounter (Signed)
I attempted to contact the patient regarding an appt that was made for her at [facility] with Dr. Clayborn Bigness for cardiac clearance. Patient's voice mail box was full. I attempted to contact her emergency contact Brynda Rim and a message was left to have the patient contact me at [phone].

## 2019-04-13 ENCOUNTER — Other Ambulatory Visit (INDEPENDENT_AMBULATORY_CARE_PROVIDER_SITE_OTHER): Payer: Self-pay | Admitting: Nurse Practitioner

## 2019-04-14 NOTE — Telephone Encounter (Signed)
Patient called requesting refill

## 2019-04-14 NOTE — Telephone Encounter (Signed)
I called to check on the cardiac clearance for the patient and where she Is in the process. Patient was seen by Dr. Etta Quill office on 03/17/2019 and was scheduled for stress and other tests. Patient called back and canceled those tests and stated she would call to reschedule, per [facility] the patient has not called back.

## 2019-04-15 NOTE — Telephone Encounter (Signed)
At this time the patient has elected not to undergo surgery for her PAD.  Since the patient is not undergoing surgery, she can talk with her PCP about managing the chronic pain or we can place a referral to a pain clinic.

## 2019-04-15 NOTE — Telephone Encounter (Signed)
I spoke with the patient and she would like to schedule an appointment to speak with Dr Delana Meyer, The patient has been schedule to come in 04/18/2019 @10 :15

## 2019-04-17 NOTE — Progress Notes (Signed)
MRN : [id number]  Christine [name] is a 73 y.o. (1946/05/09) female who presents with chief complaint of No chief complaint on file. Marland Kitchen  History of Present Illness:   The patient is seen for follow up evaluation of atherosclerosis with rest pain in association with a small AAA.  She is status post CTA. There were no problems or complications related to the CT scan. The patient denies interval development of abdominal or back pain. No new discoloration of the toes.   She continues to have severe pain of the left foot.  The patient has a history of coronary artery disease, no recent episodes of angina or shortness of breath. The patient denies interval anaurosis fugax. There is o recent history of TIA symptoms or focal motor deficits.   There is a history of hyperlipidemia which is being treated with a statin.   CT angiography of the abdomen and pelvis shows an infrarenal AAA 3.4 cm associated with multiple >70% stenosis.  No outpatient medications have been marked as taking for the 04/18/19 encounter (Appointment) with Delana Meyer, Dolores Lory, MD.    Past Medical History:  Diagnosis Date  . Allergic rhinitis   . Anxiety   . Breast cancer ([place])   . Chronic systolic heart failure ([place])   . Coronary artery disease   . GERD (gastroesophageal reflux disease)   . Heart murmur   . Hyperlipidemia   . Hypertension   . Implantable defibrillator St Jude    DOI 2006  . MI (myocardial infarction) ([place])   . Osteoarthritis     Past Surgical History:  Procedure Laterality Date  . CHF exacerbation  10/07  . CORONARY STENT PLACEMENT  8/06   3 stents; myocardial infarction  . LOWER EXTREMITY ANGIOGRAPHY Left 02/15/2019   Procedure: LOWER EXTREMITY ANGIOGRAPHY;  Surgeon: Katha Cabal, MD;  Location: [facility];  Service: Cardiovascular;  Laterality: Left;  . NM MYOVIEW LTD  1/08   EF 39%, no sx ischemia  . PACEMAKER PLACEMENT  2/07   Defibrillator  . RENAL ARTERY STENT Left      Social History Social History   Tobacco Use  . Smoking status: Current Every Day Smoker    Packs/day: 0.50  . Smokeless tobacco: Never Used  . Tobacco comment: also uses E-cig  Substance Use Topics  . Alcohol use: No    Comment: 2-3 times per month  . Drug use: No    Family History Family History  Problem Relation Age of Onset  . Stroke Mother 74  . Heart attack Father   . Coronary artery disease Father   . Heart attack Brother   . Coronary artery disease Other   . Hypertension Other   . Cancer Neg Hx        No breast or colon cancer    Allergies  Allergen Reactions  . Atorvastatin Other (See Comments)    increased LFT's  . Lisinopril Cough  . Oxycodone Hcl Itching and Other (See Comments)    Restless & jittery  . Simvastatin Other (See Comments)    myalgias     REVIEW OF SYSTEMS (Negative unless checked)  Constitutional: [] Weight loss  [] Fever  [] Chills Cardiac: [] Chest pain   [] Chest pressure   [] Palpitations   [] Shortness of breath when laying flat   [] Shortness of breath with exertion. Vascular:  [x] Pain in legs with walking   [x] Pain in legs at rest  [] History of DVT   [] Phlebitis   [] Swelling in  legs   [] Varicose veins   [] Non-healing ulcers Pulmonary:   [] Uses home oxygen   [] Productive cough   [] Hemoptysis   [] Wheeze  [x] COPD   [] Asthma Neurologic:  [] Dizziness   [] Seizures   [] History of stroke   [] History of TIA  [] Aphasia   [] Vissual changes   [] Weakness or numbness in arm   [] Weakness or numbness in leg Musculoskeletal:   [] Joint swelling   [x] Joint pain   [] Low back pain Hematologic:  [] Easy bruising  [] Easy bleeding   [] Hypercoagulable state   [] Anemic Gastrointestinal:  [] Diarrhea   [] Vomiting  [] Gastroesophageal reflux/heartburn   [] Difficulty swallowing. Genitourinary:  [] Chronic kidney disease   [] Difficult urination  [] Frequent urination   [] Blood in urine Skin:  [] Rashes   [] Ulcers  Psychological:  [] History of anxiety   []  History of major  depression.  Physical Examination  There were no vitals filed for this visit. There is no height or weight on file to calculate BMI. Gen: WD/WN, NAD Head: [place]/AT, No temporalis wasting.  Ear/Nose/Throat: Hearing grossly intact, nares w/o erythema or drainage Eyes: PER, EOMI, sclera nonicteric.  Neck: Supple, no large masses.   Pulmonary:  Good air movement, no audible wheezing bilaterally, no use of accessory muscles.  Cardiac: RRR, no JVD Vascular:  Vessel Right Left  Radial Palpable Palpable  PT Not Palpable Not Palpable  DP Not Palpable Not Palpable  Gastrointestinal: Non-distended. No guarding/no peritoneal signs.  Musculoskeletal: M/S 5/5 throughout.  No deformity or atrophy.  Neurologic: CN 2-12 intact. Symmetrical.  Speech is fluent. Motor exam as listed above. Psychiatric: Judgment intact, Mood & affect appropriate for pt's clinical situation. Dermatologic: No rashes or ulcers noted.  No changes consistent with cellulitis. Lymph : No lichenification or skin changes of chronic lymphedema.  CBC Lab Results  Component Value Date   WBC 5.9 03/09/2018   HGB 12.8 03/09/2018   HCT 38.0 03/09/2018   MCV 91.5 03/09/2018   PLT 127 (L) 03/09/2018    BMET    Component Value Date/Time   NA 139 03/09/2018 0949   NA 142 05/03/2014 1036   NA 141 01/17/2014 1551   K 5.1 03/09/2018 0949   K 3.6 01/17/2014 1551   CL 103 03/09/2018 0949   CL 107 01/17/2014 1551   CO2 27 03/09/2018 0949   CO2 27 01/17/2014 1551   GLUCOSE 89 03/09/2018 0949   GLUCOSE 86 01/17/2014 1551   BUN 12 02/15/2019 0735   BUN 6 (L) 05/03/2014 1036   BUN 6 (L) 01/17/2014 1551   CREATININE 1.00 02/15/2019 0735   CREATININE 0.94 04/03/2014 1336   CALCIUM 9.6 03/09/2018 0949   CALCIUM 8.5 01/17/2014 1551   GFRNONAA 57 (L) 02/15/2019 0735   GFRNONAA >60 04/03/2014 1336   GFRNONAA >60 01/17/2014 1551   GFRAA >60 02/15/2019 0735   GFRAA >60 04/03/2014 1336   GFRAA >60 01/17/2014 1551   CrCl cannot be  calculated (Patient's most recent lab result is older than the maximum 21 days allowed.).  COAG Lab Results  Component Value Date   INR 1.0 05/03/2014   INR 1.1 01/17/2014   INR CANCELED 04/03/2011    Radiology No results found.   Assessment/Plan 1. Atherosclerosis of native artery of both lower extremities with rest pain ([place]) Recommend:  The aneurysm is associated with ASO and rest pain and therefore should undergo repair. Patient is status post CT scan of the abdominal aorta. The patient is a candidate for endovascular repair.   The patient will continue  antiplatelet therapy as prescribed (since the patient is undergoing endovascular repair as opposed to open repair) as well as aggressive management of hyperlipidemia. Exercise is again strongly encouraged.   The patient is reminded that lifetime routine surveillance is a necessity with an endograft.   The risks and benefits of AAA repair are reviewed with the patient.  All questions are answered.  Alternative therapies are also discussed.  The patient agrees to proceed with endovascular aneurysm repair.  2. AAA (abdominal aortic aneurysm) without rupture ([place]) Recommend: The aneurysm is associated with ASO and rest pain and therefore should undergo repair. Patient is status post CT scan of the abdominal aorta. The patient is a candidate for endovascular repair.   The patient will continue antiplatelet therapy as prescribed (since the patient is undergoing endovascular repair as opposed to open repair) as well as aggressive management of hyperlipidemia. Exercise is again strongly encouraged.   The patient is reminded that lifetime routine surveillance is a necessity with an endograft.   The risks and benefits of AAA repair are reviewed with the patient.  All questions are answered.  Alternative therapies are also discussed.  The patient agrees to proceed with endovascular aneurysm repair.  3. Renal artery stenosis ([facility])  Recommend:  The patient has evidence of atherosclerotic changes of the renal artery s/p right renal artery stent.  At this time the patient's blood pressure is fairly well controlled.  Patient does not need further angiography of the renal artery given the good control of her hypertension.    However, if at any point the patient's BP becomes acutely worse then intervention would be strongly encouraged.  The patient voices understanding of this plan and agrees.     4. Atherosclerosis of native coronary artery of native heart without angina pectoris Continue cardiac and antihypertensive medications as already ordered and reviewed, no changes at this time.  Continue statin as ordered and reviewed, no changes at this time  Nitrates PRN for chest pain   5. Essential hypertension Continue antihypertensive medications as already ordered, these medications have been reviewed and there are no changes at this time.   6. Gastroesophageal reflux disease without esophagitis Continue PPI as already ordered, this medication has been reviewed and there are no changes at this time.  Avoidence of caffeine and alcohol  Moderate elevation of the head of the bed    Christine Pilar, MD  04/17/2019 11:48 AM

## 2019-04-18 ENCOUNTER — Other Ambulatory Visit: Payer: Self-pay

## 2019-04-18 ENCOUNTER — Ambulatory Visit (INDEPENDENT_AMBULATORY_CARE_PROVIDER_SITE_OTHER): Payer: Medicare Other | Admitting: Vascular Surgery

## 2019-04-18 ENCOUNTER — Encounter (INDEPENDENT_AMBULATORY_CARE_PROVIDER_SITE_OTHER): Payer: Self-pay | Admitting: Vascular Surgery

## 2019-04-18 VITALS — BP 163/81 | HR 71 | Resp 16 | Wt 93.0 lb

## 2019-04-18 DIAGNOSIS — I70223 Atherosclerosis of native arteries of extremities with rest pain, bilateral legs: Secondary | ICD-10-CM

## 2019-04-18 DIAGNOSIS — I701 Atherosclerosis of renal artery: Secondary | ICD-10-CM

## 2019-04-18 DIAGNOSIS — K219 Gastro-esophageal reflux disease without esophagitis: Secondary | ICD-10-CM

## 2019-04-18 DIAGNOSIS — I714 Abdominal aortic aneurysm, without rupture, unspecified: Secondary | ICD-10-CM

## 2019-04-18 DIAGNOSIS — I2510 Atherosclerotic heart disease of native coronary artery without angina pectoris: Secondary | ICD-10-CM

## 2019-04-18 DIAGNOSIS — I10 Essential (primary) hypertension: Secondary | ICD-10-CM

## 2019-04-25 ENCOUNTER — Encounter: Payer: Self-pay | Admitting: Emergency Medicine

## 2019-04-25 ENCOUNTER — Emergency Department
Admission: EM | Admit: 2019-04-25 | Discharge: 2019-04-25 | Disposition: A | Discharge status: Home / Self Care | Payer: Medicare Other | Attending: Student in an Organized Health Care Education/Training Program | Admitting: Student in an Organized Health Care Education/Training Program

## 2019-04-25 ENCOUNTER — Emergency Department: Payer: Medicare Other

## 2019-04-25 ENCOUNTER — Other Ambulatory Visit: Payer: Self-pay

## 2019-04-25 DIAGNOSIS — Z853 Personal history of malignant neoplasm of breast: Secondary | ICD-10-CM | POA: Diagnosis not present

## 2019-04-25 DIAGNOSIS — S93492A Sprain of other ligament of left ankle, initial encounter: Secondary | ICD-10-CM | POA: Diagnosis not present

## 2019-04-25 DIAGNOSIS — J449 Chronic obstructive pulmonary disease, unspecified: Secondary | ICD-10-CM | POA: Diagnosis not present

## 2019-04-25 DIAGNOSIS — Y999 Unspecified external cause status: Secondary | ICD-10-CM | POA: Diagnosis not present

## 2019-04-25 DIAGNOSIS — X501XXA Overexertion from prolonged static or awkward postures, initial encounter: Secondary | ICD-10-CM | POA: Insufficient documentation

## 2019-04-25 DIAGNOSIS — Y93K1 Activity, walking an animal: Secondary | ICD-10-CM | POA: Insufficient documentation

## 2019-04-25 DIAGNOSIS — Z950 Presence of cardiac pacemaker: Secondary | ICD-10-CM | POA: Insufficient documentation

## 2019-04-25 DIAGNOSIS — I110 Hypertensive heart disease with heart failure: Secondary | ICD-10-CM | POA: Diagnosis not present

## 2019-04-25 DIAGNOSIS — Y929 Unspecified place or not applicable: Secondary | ICD-10-CM | POA: Diagnosis not present

## 2019-04-25 DIAGNOSIS — I2510 Atherosclerotic heart disease of native coronary artery without angina pectoris: Secondary | ICD-10-CM | POA: Insufficient documentation

## 2019-04-25 DIAGNOSIS — S99912A Unspecified injury of left ankle, initial encounter: Secondary | ICD-10-CM | POA: Diagnosis present

## 2019-04-25 DIAGNOSIS — I5022 Chronic systolic (congestive) heart failure: Secondary | ICD-10-CM | POA: Insufficient documentation

## 2019-04-25 MED ORDER — HYDROCODONE-ACETAMINOPHEN 5-325 MG PO TABS: 0.5000 | ORAL_TABLET | Freq: Four times a day (QID) | ORAL | 0 refills | Status: DC | PRN

## 2019-04-25 NOTE — Discharge Instructions (Addendum)
Please use walker to ambulate and use splint as needed for comfort.  Rest ice and elevate the left lower extremity.  Return to the ER for any increasing pain worsening symptoms or urgent changes in health.

## 2019-04-25 NOTE — ED Triage Notes (Signed)
First Nurse Note:  Per EMS patient rolled her left ankle while walking her dog.  Patient states she did not fall, just injured her ankle.  Patient took pain medication she had at home that helped with her pain.

## 2019-04-25 NOTE — ED Provider Notes (Signed)
[facility] EMERGENCY DEPARTMENT Provider Note   CSN: [id number] Arrival date & time: 04/25/19  2122     History   Chief Complaint Chief Complaint  Patient presents with  . Ankle Pain    HPI Christine [name] is a 73 y.o. female presents emergency department evaluation of left ankle pain.  Patient states she rolled her left ankle while walking her dog.  Mechanical fall.  No dizziness or lightheadedness.  No chest pain or shortness of breath.  She has a history of vascular claudication.  She denies any worsening foot pain, numbness or tingling.  She complains of pain along the lateral aspect of the ankle.  She is ambulatory but with a limp.  Typically ambulates no assistive devices.  She takes tramadol at baseline for pain but this is not helping.     HPI  Past Medical History:  Diagnosis Date  . Allergic rhinitis   . Anxiety   . Breast cancer ([place])   . Chronic systolic heart failure ([place])   . Coronary artery disease   . GERD (gastroesophageal reflux disease)   . Heart murmur   . Hyperlipidemia   . Hypertension   . Implantable defibrillator St Jude    DOI 2006  . MI (myocardial infarction) ([place])   . Osteoarthritis     Patient Active Problem List   Diagnosis Date Noted  . Renal artery stenosis ([place]) 03/13/2019  . AAA (abdominal aortic aneurysm) without rupture ([place]) 03/13/2019  . Atherosclerosis of native arteries of extremity with rest pain ([place]) 02/13/2019  . Leg swelling 02/13/2019  . Postmenopausal 07/01/2018  . Skin ulcer with fat layer exposed ([place]) 01/16/2018  . Weakness generalized 04/10/2017  . Major depressive disorder, recurrent, moderate ([place]) 04/09/2017  . Pure hypercholesterolemia 04/09/2017  . Pain in both lower extremities 03/31/2017  . Breast cancer of upper-inner quadrant of right female breast ([place]) 07/01/2016  . Moderate single current episode of major depressive disorder ([place]) 07/16/2015  . Headache 10/31/2014  . Chronic  obstructive pulmonary disease ([place]) 04/05/2014  . Current tobacco use 04/05/2014  . Anxiety 11/25/2013  . Breast cancer ([place]) 11/25/2013  . Depression 11/25/2013  . Thrombus 03/17/2011  . CARDIOMYOPATHY, ISCHEMIC 11/09/2008  . ICD - IN SITU 11/09/2008  . ANEMIA 07/30/2007  . ABNORMAL THYROID FUNCTION TESTS 07/30/2007  . Hyperlipidemia 11/16/2006  . Essential hypertension 11/16/2006  . Coronary atherosclerosis 11/16/2006  . FAILURE, SYSTOLIC HEART, CHRONIC 99991111  . ALLERGIC RHINITIS 11/16/2006  . GERD 11/16/2006  . Primary osteoarthritis involving multiple joints 11/16/2006  . BACK PAIN 11/16/2006    Past Surgical History:  Procedure Laterality Date  . CHF exacerbation  10/07  . CORONARY STENT PLACEMENT  8/06   3 stents; myocardial infarction  . LOWER EXTREMITY ANGIOGRAPHY Left 02/15/2019   Procedure: LOWER EXTREMITY ANGIOGRAPHY;  Surgeon: Katha Cabal, MD;  Location: [facility];  Service: Cardiovascular;  Laterality: Left;  . NM MYOVIEW LTD  1/08   EF 39%, no sx ischemia  . PACEMAKER PLACEMENT  2/07   Defibrillator  . RENAL ARTERY STENT Left      OB History   No obstetric history on file.      Home Medications    Prior to Admission medications   Medication Sig Start Date End Date Taking? Authorizing Provider  albuterol (VENTOLIN HFA) 108 (90 Base) MCG/ACT inhaler Inhale 2 puffs into the lungs 4 (four) times daily as needed (wheezing/shortness of breath).  03/31/17   [provider]  aspirin EC 81 MG tablet Take 81 mg by mouth daily.    [provider]  bisacodyl (DULCOLAX) 5 MG EC tablet Take 5 mg by mouth daily as needed for mild constipation or moderate constipation.    [provider]  carvedilol (COREG) 25 MG tablet TAKE 1 TABLET BY MOUTH TWICE A DAY Patient taking differently: Take 25 mg by mouth 2 (two) times daily.  09/09/10   Venia Carbon, MD  Cholecalciferol (VITAMIN D) 2000 UNITS tablet Take 2,000 Units by  mouth daily.    [provider]  clopidogrel (PLAVIX) 75 MG tablet Take 1 tablet (75 mg total) by mouth daily. 02/16/19   Schnier, Dolores Lory, MD  dicyclomine (BENTYL) 20 MG tablet Take by mouth. 11/28/16 03/10/19  [provider]  Flaxseed, Linseed, (FLAXSEED OIL) 1000 MG CAPS Take 1,000 mg by mouth daily.    [provider]  furosemide (LASIX) 40 MG tablet Take 40 mg by mouth daily as needed (fluid retention/swelling).    [provider]  HYDROcodone-acetaminophen (NORCO) 5-325 MG tablet Take 0.5 tablets by mouth every 6 (six) hours as needed for moderate pain. 04/25/19   Duanne Guess, PA-C  loratadine (CLARITIN) 10 MG tablet Take 10 mg by mouth daily as needed for allergies.     [provider]  LORazepam (ATIVAN) 0.5 MG tablet Take 0.25 mg by mouth 2 (two) times daily.  03/13/17   [provider]  Multiple Vitamin (ANTIOXIDANT FORMULA PO) Take 1 tablet by mouth daily.    [provider]  Multiple Vitamins-Minerals (HEALTHY EYES/LUTEIN PO) Take 1 tablet by mouth daily.    [provider]  pantoprazole (PROTONIX) 40 MG tablet Take 40 mg by mouth daily.  02/25/17   [provider]  Polyethylene Glycol 3350 (PEG 3350) POWD Take 17 g by mouth daily as needed (constipation.).     [provider]  rosuvastatin (CRESTOR) 10 MG tablet Take 10 mg by mouth daily.     [provider]  tamoxifen (NOLVADEX) 20 MG tablet Take 1 tablet (20 mg total) by mouth daily. Patient taking differently: Take 20 mg by mouth daily at 12 noon.  12/29/18   Lloyd Huger, MD  tetrahydrozoline 0.05 % ophthalmic solution Place 1 drop into both eyes 3 (three) times daily as needed (dry/irritated eyes).    [provider]  traMADol (ULTRAM) 50 MG tablet Take 25 mg by mouth 2 (two) times daily.    [provider]  valsartan (DIOVAN) 320 MG tablet Take 320 mg by mouth daily at 12 noon.     [provider]     Family History Family History  Problem Relation Age of Onset  . Stroke Mother 20  . Heart attack Father   . Coronary artery disease Father   . Heart attack Brother   . Coronary artery disease Other   . Hypertension Other   . Cancer Neg Hx        No breast or colon cancer    Social History Social History   Tobacco Use  . Smoking status: Current Every Day Smoker    Packs/day: 0.50  . Smokeless tobacco: Never Used  . Tobacco comment: also uses E-cig  Substance Use Topics  . Alcohol use: No    Comment: 2-3 times per month  . Drug use: No     Allergies   Atorvastatin, Lisinopril, Oxycodone hcl, and Simvastatin   Review of Systems Review of Systems  Constitutional: Negative.   Cardiovascular: Negative for chest pain and leg swelling.  Gastrointestinal: Negative for abdominal pain.  Musculoskeletal: Positive for gait problem and joint swelling. Negative for back pain and neck pain.  Skin: Negative for color change, rash and wound.  Neurological: Negative for dizziness, syncope and weakness.  Psychiatric/Behavioral: Negative for confusion and hallucinations.  All other systems reviewed and are negative.    Physical Exam Updated Vital Signs BP 123/60 (BP Location: Left Arm)   Pulse 66   Temp 97.8 F (36.6 C) (Oral)   Resp 16   Ht 5\' 2"  (1.575 m)   Wt 43.1 kg   SpO2 100%   BMI 17.38 kg/m   Physical Exam Constitutional:      Appearance: She is well-developed.  HENT:     Head: Normocephalic and atraumatic.     Right Ear: External ear normal.     Left Ear: External ear normal.     Nose: Nose normal.  Eyes:     Conjunctiva/sclera: Conjunctivae normal.     Pupils: Pupils are equal, round, and reactive to light.  Neck:     Musculoskeletal: Normal range of motion.  Cardiovascular:     Rate and Rhythm: Normal rate.  Pulmonary:     Effort: Pulmonary effort is normal. No respiratory distress.     Breath sounds: Normal breath sounds.  Abdominal:      Palpations: Abdomen is soft.     Tenderness: There is no abdominal tenderness.  Musculoskeletal: Normal range of motion.        General: No deformity.     Comments: Mild tenderness over the lateral aspect of the left ankle lateral malleolus.  1+ dorsalis pedis pulses.  Sensation intact distally.  No skin breakdown noted.  No wounds.  Skin:    General: Skin is warm and dry.     Findings: No rash.  Neurological:     Mental Status: She is alert and oriented to person, place, and time.     Cranial Nerves: No cranial nerve deficit.     Coordination: Coordination normal.  Psychiatric:        Behavior: Behavior normal.      ED Treatments / Results  Labs (all labs ordered are listed, but only abnormal results are displayed) Labs Reviewed - No data to display  EKG None  Radiology Dg Ankle Complete Left  Result Date: 04/25/2019 CLINICAL DATA:  Pain, trauma EXAM: LEFT ANKLE COMPLETE - 3+ VIEW COMPARISON:  None. FINDINGS: There is no evidence of fracture, dislocation, or joint effusion. There is no evidence of arthropathy or other focal bone abnormality. There is diffuse osteopenia. IMPRESSION: No acute osseous abnormality. Electronically Signed   By: Prudencio Pair M.D.   On: 04/25/2019 22:31    Procedures Procedures (including critical care time)  Medications Ordered in ED Medications - No data to display   Initial Impression / Assessment and Plan / ED Course  I have reviewed the triage vital signs and the nursing notes.  Pertinent labs & imaging results that were available during my care of the patient were reviewed by me and considered in my medical decision making (see chart for details).        73 year old female with lateral ankle sprain.  She is placed into a prefabricated ankle brace, given a walker.  She will rest ice and elevate.  Norco, half a tablet as needed for pain.  She will follow orthopedics if no improvement 1 week.  Final Clinical Impressions(s) /  ED Diagnoses    Final diagnoses:  Sprain of anterior talofibular ligament of left ankle, initial encounter    ED Discharge Orders         Ordered    HYDROcodone-acetaminophen (NORCO) 5-325 MG tablet  Every 6 hours PRN     04/25/19 2252           Renata Caprice 04/25/19 2256    Merlyn Lot, MD 04/25/19 2304

## 2019-05-03 ENCOUNTER — Telehealth (INDEPENDENT_AMBULATORY_CARE_PROVIDER_SITE_OTHER): Payer: Self-pay

## 2019-05-03 NOTE — Telephone Encounter (Signed)
Spoke with the patient to get her scheduled for her procedure with Dr. Delana Meyer. Patient stated she was too frazzled to schedule anything, she didn't know who was coming or not. Patient stated she would call me when she was ready to schedule her procedure with Dr. Delana Meyer.

## 2019-05-13 ENCOUNTER — Other Ambulatory Visit (INDEPENDENT_AMBULATORY_CARE_PROVIDER_SITE_OTHER): Payer: Self-pay | Admitting: Vascular Surgery

## 2019-08-11 ENCOUNTER — Telehealth (INDEPENDENT_AMBULATORY_CARE_PROVIDER_SITE_OTHER): Payer: Self-pay

## 2019-08-11 NOTE — Telephone Encounter (Signed)
Please call patient to schedule appointment she has been made aware with medical advice

## 2019-08-11 NOTE — Telephone Encounter (Signed)
The patient has known severe atherosclerotic disease and was originally scheduled to have intervention to try to help the cause of the pain, however the patient decided to cancel her procedure.  We should bring her in to do studies to see if the disease is worsening.  She should have an aorta illiac duplex.  We can discuss pain medication at the follow up, however giving her pain medication to stop the pain but not fixing her issues with blood flow may have detrimental results such as amputation.  GS or ME

## 2019-08-18 ENCOUNTER — Encounter (INDEPENDENT_AMBULATORY_CARE_PROVIDER_SITE_OTHER): Payer: Medicare Other

## 2019-08-18 ENCOUNTER — Ambulatory Visit (INDEPENDENT_AMBULATORY_CARE_PROVIDER_SITE_OTHER): Payer: Medicare Other | Admitting: Nurse Practitioner

## 2019-11-22 ENCOUNTER — Telehealth: Payer: Self-pay | Admitting: Internal Medicine

## 2019-11-22 NOTE — Telephone Encounter (Signed)
Patient wants Dr. Caryl Comes to complete forms for dmv review.    Overdue visit .  Scheduled from recall 02/02/20 at 10 am. Added to wait List .

## 2019-11-30 ENCOUNTER — Telehealth: Payer: Self-pay | Admitting: Oncology

## 2019-11-30 ENCOUNTER — Telehealth: Payer: Self-pay | Admitting: Internal Medicine

## 2019-11-30 NOTE — Telephone Encounter (Signed)
Patient phoned stating that she is stage 4 breast cancer and reports having an open wound on her breast. She reports that she is scheduled with the wound clinic on 7-6 but wanted to know if [facility] MD wanted to see her as well. Message sent to team to inform.

## 2019-11-30 NOTE — Telephone Encounter (Signed)
Patient c/o recurring wound near ppm site causing bleeding and pain in breast area .   Patient wants sooner than scheduled August 17th at 1020. Added to Wait List .  Please call to discuss.

## 2019-12-01 NOTE — Telephone Encounter (Signed)
To Device Clinic for further evaluation.

## 2019-12-01 NOTE — Telephone Encounter (Signed)
Attempted to call pt back to discuss.  ICD @EOL  in 2016, pt elected not to have replaced or removed.  It was implanted in 2007.  Pt of Dr. Caryl Comes in [place].    Attempted to reach pt to get more info/ request picture be sent.  No answer, VM full not accepting messages.

## 2019-12-01 NOTE — Telephone Encounter (Signed)
Spoke with pt.  She reports having been treated for breast cancer in the left breast.  The area that is bleeding is to the R of her left nipple.  She reports she is being cared for at wound center for several wounds that have been related to the treatment she received for breast cancer.  She is wondering if the treatment and wounds have affected the ICD that was not removed when it hit EOL in 2016.  Advised pt this would be assessed by Dr. Caryl Comes at upcoming office visit.

## 2019-12-01 NOTE — Telephone Encounter (Signed)
Follow Up:     Pt is returning your call. 

## 2019-12-04 NOTE — Progress Notes (Deleted)
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  INTERVAL HISTORY: Patient returns to clinic today for routine evaluation.  She has been noncompliant with follow-up and has not been seen since October 2019.  She also ran out of her tamoxifen several months ago.  She currently feels well and is asymptomatic.  Her left breast wound is now completely resolved.  She has no neurologic complaints. She denies any recent fevers or illnesses. She has a good appetite and denies weight loss.  She denies any chest pain, hemoptysis, cough, or shortness of breath.  She has no nausea, vomiting, constipation, or diarrhea. She has no urinary complaints.  Patient offers no specific complaints today.  REVIEW OF SYSTEMS:   Review of Systems  Constitutional: Negative.  Negative for fever, malaise/fatigue and weight loss.  Respiratory: Negative.  Negative for cough and shortness of breath.   Cardiovascular: Negative.  Negative for chest pain and leg swelling.  Gastrointestinal: Negative.  Negative for abdominal pain.  Genitourinary: Negative.  Negative for dysuria.  Musculoskeletal: Negative.  Negative for back pain.  Skin: Negative.  Negative for rash.  Neurological: Negative.  Negative for dizziness, focal weakness, weakness and headaches.  Psychiatric/Behavioral: The patient is nervous/anxious.     As per HPI. Otherwise, a complete review of systems is negative.  PAST MEDICAL HISTORY: Past Medical History:  Diagnosis Date  . Allergic rhinitis   . Anxiety   . Breast cancer ([place])   . Chronic systolic heart failure ([place])   . Coronary artery disease   . GERD (gastroesophageal reflux disease)   .  Heart murmur   . Hyperlipidemia   . Hypertension   . Implantable defibrillator [employer] Jude    DOI 2006  . MI (myocardial infarction) ([place])   . Osteoarthritis     PAST SURGICAL HISTORY: Past Surgical History:  Procedure Laterality Date  . CHF exacerbation  10/07  . CORONARY STENT PLACEMENT  8/06   3 stents; myocardial infarction  . LOWER EXTREMITY ANGIOGRAPHY Left 02/15/2019   Procedure: LOWER EXTREMITY ANGIOGRAPHY;  Surgeon: Katha Cabal, MD;  Location: [facility];  Service: Cardiovascular;  Laterality: Left;  . NM MYOVIEW LTD  1/08   EF 39%, no sx ischemia  . PACEMAKER PLACEMENT  2/07   Defibrillator  . RENAL ARTERY STENT Left     FAMILY HISTORY: Family History  Problem Relation Age of Onset  . Stroke Mother 73  . Heart attack Father   . Coronary artery disease Father   . Heart attack Brother   . Coronary artery disease Other   . Hypertension Other   . Cancer Neg Hx        No breast or colon cancer    ADVANCED DIRECTIVES (Y/N):  N  HEALTH MAINTENANCE: Social History   Tobacco Use  . Smoking status: Current Every Day Smoker    Packs/day: 0.50  . Smokeless tobacco: Never Used  . Tobacco comment: also uses E-cig  Vaping Use  . Vaping Use: Some days  Substance Use Topics  . Alcohol use: No    Comment: 2-3 times per month  . Drug use: No     Colonoscopy:  PAP:  Bone density:  Lipid panel:  Allergies  Allergen Reactions  . Atorvastatin Other (See Comments)    increased LFT's  . Lisinopril Cough  . Oxycodone Hcl Itching and Other (See Comments)    Restless & jittery  . Simvastatin Other (See Comments)    myalgias    Current Outpatient Medications  Medication Sig Dispense Refill  . albuterol (VENTOLIN HFA) 108 (90 Base) MCG/ACT inhaler Inhale 2 puffs into the lungs 4 (four) times daily as needed (wheezing/shortness of breath).     Marland Kitchen aspirin EC 81 MG tablet Take 81 mg by mouth daily.    . bisacodyl (DULCOLAX) 5 MG EC tablet Take 5 mg by  mouth daily as needed for mild constipation or moderate constipation.    . carvedilol (COREG) 25 MG tablet TAKE 1 TABLET BY MOUTH TWICE A DAY (Patient taking differently: Take 25 mg by mouth 2 (two) times daily. ) 60 tablet 2  . Cholecalciferol (VITAMIN D) 2000 UNITS tablet Take 2,000 Units by mouth daily.    . clopidogrel (PLAVIX) 75 MG tablet TAKE 1 TABLET BY MOUTH ONCE DAILY 30 tablet 4  . dicyclomine (BENTYL) 20 MG tablet Take by mouth.    . Flaxseed, Linseed, (FLAXSEED OIL) 1000 MG CAPS Take 1,000 mg by mouth daily.    . furosemide (LASIX) 40 MG tablet Take 40 mg by mouth daily as needed (fluid retention/swelling).    Marland Kitchen HYDROcodone-acetaminophen (NORCO) 5-325 MG tablet Take 0.5 tablets by mouth every 6 (six) hours as needed for moderate pain. 10 tablet 0  . loratadine (CLARITIN) 10 MG tablet Take 10 mg by mouth daily as needed for allergies.     Marland Kitchen LORazepam (ATIVAN) 0.5 MG tablet Take 0.25 mg by mouth 2 (two) times daily.     . Multiple Vitamin (ANTIOXIDANT FORMULA PO) Take 1 tablet by mouth daily.    . Multiple Vitamins-Minerals (HEALTHY EYES/LUTEIN PO) Take 1 tablet by mouth daily.    . pantoprazole (PROTONIX) 40 MG tablet Take 40 mg by mouth daily.     . Polyethylene Glycol 3350 (PEG 3350) POWD Take 17 g by mouth daily as needed (constipation.).     Marland Kitchen rosuvastatin (CRESTOR) 10 MG tablet Take 10 mg by mouth daily.     . tamoxifen (NOLVADEX) 20 MG tablet Take 1 tablet (20 mg total) by mouth daily. (Patient taking differently: Take 20 mg by mouth daily at 12 noon. ) 90 tablet 3  . tetrahydrozoline 0.05 % ophthalmic solution Place 1 drop into both eyes 3 (three) times daily as needed (dry/irritated eyes).    . traMADol (ULTRAM) 50 MG tablet Take 25 mg by mouth 2 (two) times daily.    . valsartan (DIOVAN) 320 MG tablet Take 320 mg by mouth daily at 12 noon.      No current facility-administered medications for this visit.    OBJECTIVE: There were no vitals filed for this visit.   There is  no height or weight on file to calculate BMI.    ECOG FS:0 - Asymptomatic  General: Well-developed, well-nourished, no acute distress. Eyes: Pink conjunctiva, anicteric sclera. HEENT: Normocephalic, moist mucous membranes, clear oropharnyx. Breast: ICD/pacemaker noticed in chest wall.  Left breast without lumps or masses with well healing wound. Lungs: Clear to auscultation bilaterally. Heart: Regular rate and rhythm. No rubs, murmurs, or gallops. Abdomen: Soft, nontender, nondistended. No organomegaly noted, normoactive bowel sounds. Musculoskeletal: No edema, cyanosis, or clubbing. Neuro: Alert, answering all questions appropriately. Cranial nerves grossly intact. Skin: No rashes or petechiae noted. Psych: Normal affect.  LAB RESULTS:  Lab Results  Component Value Date   NA 139 03/09/2018   K 5.1 03/09/2018   CL 103 03/09/2018   CO2 27 03/09/2018   GLUCOSE 89 03/09/2018   BUN 12 02/15/2019   CREATININE 1.00 02/15/2019   CALCIUM 9.6 03/09/2018   PROT 6.9 03/09/2018   ALBUMIN 4.2 03/09/2018   AST 23 03/09/2018   ALT 11 03/09/2018   ALKPHOS 82 03/09/2018   BILITOT 0.7 03/09/2018   GFRNONAA 57 (L) 02/15/2019   GFRAA >60 02/15/2019    Lab Results  Component Value Date   WBC 5.9 03/09/2018   NEUTROABS 3.4 03/09/2018   HGB 12.8 03/09/2018   HCT 38.0 03/09/2018   MCV 91.5 03/09/2018   PLT 127 (L) 03/09/2018     STUDIES: No results found.  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  PLAN:    1. 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 She received one dose of chemotherapy using Taxotere and Cytoxan in 2013. She did not receive any XRT or hormonal treatment. PET scan results from July 08, 2016 reviewed independently possibly suggesting metastatic  lesions in her lung, but these are too small to characterize. Patient received 1 dose of Taxotere and Cytoxan again on July 30, 2016, but then did not return for any further follow-up.  Patient admits she is hesitant to undergo surgery or chemotherapy, and given her history of compliance is unclear if XRT would offer any benefit.  Patient could not tolerate letrozole therefore this was discontinued.  She was tolerating tamoxifen with improvement of her disease, but ran out of medication nearly 3 months ago.  She was given a refill today.  Patient has not had a mammogram in several years and this is been ordered.  Return to clinic in 6 months for further evaluation.   2.  Breast wound: Resolved.    I spent a total of 30 minutes face-to-face with the patient of which greater than 50% of the visit was spent in counseling and coordination of care as detailed above.   Patient expressed understanding and was in agreement with this plan. She also understands that She can call clinic at any time with any questions, concerns, or complaints.   Cancer Staging Breast cancer of upper-inner quadrant of right female breast [facility]) Staging form: Breast, AJCC 8th Edition - Clinical: Stage IIIB (cT4b, cN1, cM0, G2, ER: Positive, PR: Positive, HER2: Negative) - Signed by Lloyd Huger, MD on 07/01/2016   Lloyd Huger, MD   12/04/2019 9:09 AM

## 2019-12-05 ENCOUNTER — Other Ambulatory Visit (INDEPENDENT_AMBULATORY_CARE_PROVIDER_SITE_OTHER): Payer: Self-pay | Admitting: Vascular Surgery

## 2019-12-05 NOTE — Telephone Encounter (Signed)
Noted- will assess DMV forms at her office visit with Dr. Caryl Comes.

## 2019-12-08 ENCOUNTER — Inpatient Hospital Stay: Payer: Medicare Other | Admitting: Oncology

## 2019-12-20 ENCOUNTER — Encounter: Payer: Medicare Other | Attending: Physician Assistant | Admitting: Physician Assistant

## 2019-12-20 ENCOUNTER — Other Ambulatory Visit: Payer: Self-pay

## 2019-12-20 DIAGNOSIS — Z885 Allergy status to narcotic agent status: Secondary | ICD-10-CM | POA: Diagnosis not present

## 2019-12-20 DIAGNOSIS — I2510 Atherosclerotic heart disease of native coronary artery without angina pectoris: Secondary | ICD-10-CM | POA: Diagnosis not present

## 2019-12-20 DIAGNOSIS — I110 Hypertensive heart disease with heart failure: Secondary | ICD-10-CM | POA: Insufficient documentation

## 2019-12-20 DIAGNOSIS — I70209 Unspecified atherosclerosis of native arteries of extremities, unspecified extremity: Secondary | ICD-10-CM | POA: Insufficient documentation

## 2019-12-20 DIAGNOSIS — M1990 Unspecified osteoarthritis, unspecified site: Secondary | ICD-10-CM | POA: Insufficient documentation

## 2019-12-20 DIAGNOSIS — I509 Heart failure, unspecified: Secondary | ICD-10-CM | POA: Diagnosis not present

## 2019-12-20 DIAGNOSIS — Z8249 Family history of ischemic heart disease and other diseases of the circulatory system: Secondary | ICD-10-CM | POA: Insufficient documentation

## 2019-12-20 DIAGNOSIS — L98492 Non-pressure chronic ulcer of skin of other sites with fat layer exposed: Secondary | ICD-10-CM | POA: Insufficient documentation

## 2019-12-20 DIAGNOSIS — F17200 Nicotine dependence, unspecified, uncomplicated: Secondary | ICD-10-CM | POA: Diagnosis not present

## 2019-12-20 DIAGNOSIS — I252 Old myocardial infarction: Secondary | ICD-10-CM | POA: Insufficient documentation

## 2019-12-20 DIAGNOSIS — C50912 Malignant neoplasm of unspecified site of left female breast: Secondary | ICD-10-CM | POA: Insufficient documentation

## 2019-12-20 DIAGNOSIS — E7800 Pure hypercholesterolemia, unspecified: Secondary | ICD-10-CM | POA: Diagnosis not present

## 2019-12-20 NOTE — Progress Notes (Signed)
[name], Christine ([id number]) Visit Report for 12/20/2019 Abuse/Suicide Risk Screen Details Patient Name: Christine [name], Christine [name]. Date of Service: 12/20/2019 9:15 AM Medical Record Number: [id number] Patient Account Number: [id number] Date of Birth/Sex: 1945/07/20 (73 y.o. F) Treating RN: Army Melia Primary Care Kayin Kettering: Harrel Lemon Other Clinician: Referring Khris Jansson: Glendon Axe Treating Khamiya Varin/Extender: STONE III, HOYT Weeks in Treatment: 0 Abuse/Suicide Risk Screen Items Answer ABUSE RISK SCREEN: Has anyone close to you tried to hurt or harm you recentlyo No Do you feel uncomfortable with anyone in your familyo No Has anyone forced you do things that you didnot want to doo No Electronic Signature(s) Signed: 12/20/2019 11:51:43 AM By: Army Melia Entered By: Army Melia on 12/20/2019 09:22:17 Christine [name] ([id number]) -------------------------------------------------------------------------------- Activities of Daily Living Details Patient Name: Christine, [name]. Date of Service: 12/20/2019 9:15 AM Medical Record Number: [id number] Patient Account Number: [id number] Date of Birth/Sex: May 31, 1946 (73 y.o. F) Treating RN: Army Melia Primary Care Larrisa Cravey: Harrel Lemon Other Clinician: Referring Maxen Rowland: Glendon Axe Treating Anthonymichael Munday/Extender: Melburn Hake, HOYT Weeks in Treatment: 0 Activities of Daily Living Items Answer Activities of Daily Living (Please select one for each item) Drive Automobile Completely Able Take Medications Completely Able Use Telephone Completely Able Care for Appearance Completely Able Use Toilet Completely Able Bath / Shower Completely Able Dress Self Completely Able Feed Self Completely Able Walk Completely Able Get In / Out Bed Completely Able Housework Completely Able Prepare Meals Completely Able Handle Money Completely Able Shop for Self Completely Able Electronic Signature(s) Signed: 12/20/2019 11:51:43 AM By: Army Melia Entered By:  Army Melia on 12/20/2019 09:22:29 Christine [name] ([id number]) -------------------------------------------------------------------------------- Education Screening Details Patient Name: [name], [name]. Date of Service: 12/20/2019 9:15 AM Medical Record Number: [id number] Patient Account Number: [id number] Date of Birth/Sex: 1946-02-24 (73 y.o. F) Treating RN: Army Melia Primary Care Lissie Hinesley: Harrel Lemon Other Clinician: Referring Sherrilynn Gudgel: Glendon Axe Treating Kingsley Farace/Extender: Melburn Hake, HOYT Weeks in Treatment: 0 Primary Learner Assessed: Patient Learning Preferences/Education Level/Primary Language Learning Preference: Explanation Highest Education Level: High School Preferred Language: English Cognitive Barrier Language Barrier: No Translator Needed: No Memory Deficit: No Emotional Barrier: No Cultural/Religious Beliefs Affecting Medical Care: No Physical Barrier Impaired Vision: No Impaired Hearing: No Decreased Hand dexterity: No Knowledge/Comprehension Knowledge Level: High Comprehension Level: High Ability to understand written instructions: High Ability to understand verbal instructions: High Motivation Anxiety Level: Calm Cooperation: Cooperative Education Importance: Acknowledges Need Interest in Health Problems: Asks Questions Perception: Coherent Willingness to Engage in Self-Management High Activities: Readiness to Engage in Self-Management High Activities: Electronic Signature(s) Signed: 12/20/2019 11:51:43 AM By: Army Melia Entered By: Army Melia on 12/20/2019 09:22:55 Christine [name] ([id number]) -------------------------------------------------------------------------------- Fall Risk Assessment Details Patient Name: Christine [name]. Date of Service: 12/20/2019 9:15 AM Medical Record Number: [id number] Patient Account Number: [id number] Date of Birth/Sex: 09-21-1945 (73 y.o. F) Treating RN: Army Melia Primary Care Aaryn Sermon: Harrel Lemon Other Clinician: Referring Felishia Wartman: Glendon Axe Treating Arpi Diebold/Extender: Melburn Hake, HOYT Weeks in Treatment: 0 Fall Risk Assessment Items Have you had 2 or more falls in the last 12 monthso 0 No Have you had any fall that resulted in injury in the last 12 monthso 0 No FALLS RISK SCREEN History of falling - immediate or within 3 months 0 No Secondary diagnosis (Do you have 2 or more medical diagnoseso) 0 No Ambulatory aid None/bed rest/wheelchair/nurse 0 No Crutches/cane/walker 0 No Furniture 0 No Intravenous therapy Access/Saline/Heparin Lock 0 No Gait/Transferring Normal/ bed rest/ wheelchair 0 No Weak (short steps with  or without shuffle, stooped but able to lift head while walking, may 0 No seek support from furniture) Impaired (short steps with shuffle, may have difficulty arising from chair, head down, impaired 0 No balance) Mental Status Oriented to own ability 0 No Electronic Signature(s) Signed: 12/20/2019 11:51:43 AM By: Army Melia Entered By: Army Melia on 12/20/2019 09:23:00 Christine [name] ([id number]) -------------------------------------------------------------------------------- Nutrition Risk Screening Details Patient Name: Christine [name]. Date of Service: 12/20/2019 9:15 AM Medical Record Number: [id number] Patient Account Number: [id number] Date of Birth/Sex: 26-Nov-1945 (73 y.o. F) Treating RN: Army Melia Primary Care Alejah Aristizabal: Harrel Lemon Other Clinician: Referring Marvens Hollars: Glendon Axe Treating Derrick Orris/Extender: STONE III, HOYT Weeks in Treatment: 0 Height (in): 62 Weight (lbs): 88 Body Mass Index (BMI): 16.1 Nutrition Risk Screening Items Score Screening NUTRITION RISK SCREEN: I have an illness or condition that made me change the kind and/or amount of food I eat 0 No I eat fewer than two meals per day 0 No I eat few fruits and vegetables, or milk products 0 No I have three or more drinks of beer, liquor or wine almost every day  0 No I have tooth or mouth problems that make it hard for me to eat 0 No I don't always have enough money to buy the food I need 0 No I eat alone most of the time 0 No I take three or more different prescribed or over-the-counter drugs a day 0 No Without wanting to, I have lost or gained 10 pounds in the last six months 0 No I am not always physically able to shop, cook and/or feed myself 0 No Nutrition Protocols Good Risk Protocol 0 No interventions needed Moderate Risk Protocol High Risk Proctocol Risk Level: Good Risk Score: 0 Electronic Signature(s) Signed: 12/20/2019 11:51:43 AM By: Army Melia Entered By: Army Melia on 12/20/2019 09:23:06

## 2019-12-20 NOTE — Progress Notes (Signed)
Christine [name], Christine [name] ([id number]) Visit Report for 12/20/2019 Allergy List Details Patient Name: Christine [name], Christine [name]. Date of Service: 12/20/2019 9:15 AM Medical Record Number: [id number] Patient Account Number: [id number] Date of Birth/Sex: 08/27/1945 (73 y.o. F) Treating RN: Army Melia Primary Care Ernestyne Caldwell: Harrel Lemon Other Clinician: Referring Samora Jernberg: Glendon Axe Treating Cianni Manny/Extender: STONE III, HOYT Weeks in Treatment: 0 Allergies Active Allergies oxycodone Reaction: restless Allergy Notes Electronic Signature(s) Signed: 12/20/2019 11:51:43 AM By: Army Melia Entered By: Army Melia on 12/20/2019 09:19:10 Christine [name] ([id number]) -------------------------------------------------------------------------------- Arrival Information Details Patient Name: Christine [name], Christine [name]. Date of Service: 12/20/2019 9:15 AM Medical Record Number: [id number] Patient Account Number: [id number] Date of Birth/Sex: 05-Jul-1945 (73 y.o. F) Treating RN: Army Melia Primary Care Haedyn Breau: Harrel Lemon Other Clinician: Referring Gwenlyn Hottinger: Glendon Axe Treating Helaina Stefano/Extender: Melburn Hake, HOYT Weeks in Treatment: 0 Visit Information Patient Arrived: Ambulatory Arrival Time: 09:17 Accompanied By: self Transfer Assistance: None Patient Identification Verified: Yes History Since Last Visit Added or deleted any medications: No Any new allergies or adverse reactions: No Had a fall or experienced change in activities of daily living that may affect risk of falls: No Signs or symptoms of abuse/neglect since last visito No Hospitalized since last visit: No Electronic Signature(s) Signed: 12/20/2019 11:51:43 AM By: Army Melia Entered By: Army Melia on 12/20/2019 09:17:48 Christine [name] ([id number]) -------------------------------------------------------------------------------- Clinic Level of Care Assessment Details Patient Name: Christine [name]. Date of Service: 12/20/2019 9:15 AM Medical  Record Number: [id number] Patient Account Number: [id number] Date of Birth/Sex: 08/23/1945 (73 y.o. F) Treating RN: Army Melia Primary Care Ardit Danh: Harrel Lemon Other Clinician: Referring Derrick Orris: Glendon Axe Treating Drey Shaff/Extender: Melburn Hake, HOYT Weeks in Treatment: 0 Clinic Level of Care Assessment Items TOOL 2 Quantity Score []  - Use when only an EandM is performed on the INITIAL visit 0 ASSESSMENTS - Nursing Assessment / Reassessment X - General Physical Exam (combine w/ comprehensive assessment (listed just below) when performed on new 1 20 pt. evals) X- 1 25 Comprehensive Assessment (HX, ROS, Risk Assessments, Wounds Hx, etc.) ASSESSMENTS - Wound and Skin Assessment / Reassessment X - Simple Wound Assessment / Reassessment - one wound 1 5 []  - 0 Complex Wound Assessment / Reassessment - multiple wounds []  - 0 Dermatologic / Skin Assessment (not related to wound area) ASSESSMENTS - Ostomy and/or Continence Assessment and Care []  - Incontinence Assessment and Management 0 []  - 0 Ostomy Care Assessment and Management (repouching, etc.) PROCESS - Coordination of Care X - Simple Patient / Family Education for ongoing care 1 15 []  - 0 Complex (extensive) Patient / Family Education for ongoing care X- 1 10 Staff obtains Programmer, systems, Records, Test Results / Process Orders []  - 0 Staff telephones HHA, Nursing Homes / Clarify orders / etc []  - 0 Routine Transfer to another Facility (non-emergent condition) []  - 0 Routine Hospital Admission (non-emergent condition) X- 1 15 New Admissions / Biomedical engineer / Ordering NPWT, Apligraf, etc. []  - 0 Emergency Hospital Admission (emergent condition) X- 1 10 Simple Discharge Coordination []  - 0 Complex (extensive) Discharge Coordination PROCESS - Special Needs []  - Pediatric / Minor Patient Management 0 []  - 0 Isolation Patient Management []  - 0 Hearing / Language / Visual special needs []  - 0 Assessment of  Community assistance (transportation, D/C planning, etc.) []  - 0 Additional assistance / Altered mentation []  - 0 Support Surface(s) Assessment (bed, cushion, seat, etc.) INTERVENTIONS - Wound Cleansing / Measurement X - Wound Imaging (photographs - any number of wounds) 1 5 []  -  0 Wound Tracing (instead of photographs) X- 1 5 Simple Wound Measurement - one wound []  - 0 Complex Wound Measurement - multiple wounds Christine [name], LOPEZMARTINEZ. ([id number]) X- 1 5 Simple Wound Cleansing - one wound []  - 0 Complex Wound Cleansing - multiple wounds INTERVENTIONS - Wound Dressings []  - Small Wound Dressing one or multiple wounds 0 X- 1 15 Medium Wound Dressing one or multiple wounds []  - 0 Large Wound Dressing one or multiple wounds []  - 0 Application of Medications - injection INTERVENTIONS - Miscellaneous []  - External ear exam 0 []  - 0 Specimen Collection (cultures, biopsies, blood, body fluids, etc.) []  - 0 Specimen(s) / Culture(s) sent or taken to Lab for analysis []  - 0 Patient Transfer (multiple staff / Civil Service fast streamer / Similar devices) []  - 0 Simple Staple / Suture removal (25 or less) []  - 0 Complex Staple / Suture removal (26 or more) []  - 0 Hypo / Hyperglycemic Management (close monitor of Blood Glucose) []  - 0 Ankle / Brachial Index (ABI) - do not check if billed separately Has the patient been seen at the hospital within the last three years: Yes Total Score: 130 Level Of Care: New/Established - Level 4 Electronic Signature(s) Signed: 12/20/2019 11:51:43 AM By: Army Melia Entered By: Army Melia on 12/20/2019 09:40:27 Christine [name] ([id number]) -------------------------------------------------------------------------------- Encounter Discharge Information Details Patient Name: Christine [name]. Date of Service: 12/20/2019 9:15 AM Medical Record Number: [id number] Patient Account Number: [id number] Date of Birth/Sex: [birth year] (73 y.o. F) Treating RN: Army Melia Primary  Care Veryl Winemiller: Harrel Lemon Other Clinician: Referring Arin Vanosdol: Glendon Axe Treating Xochilth Standish/Extender: Melburn Hake, HOYT Weeks in Treatment: 0 Encounter Discharge Information Items Post Procedure Vitals Discharge Condition: Stable Temperature (F): 98.1 Ambulatory Status: Ambulatory Pulse (bpm): 66 Discharge Destination: Home Respiratory Rate (breaths/min): 16 Transportation: Private Auto Blood Pressure (mmHg): 121/58 Accompanied By: self Schedule Follow-up Appointment: No Clinical Summary of Care: Electronic Signature(s) Signed: 12/20/2019 11:51:43 AM By: Army Melia Entered By: Army Melia on 12/20/2019 09:42:24 Christine [name] ([id number]) -------------------------------------------------------------------------------- Lower Extremity Assessment Details Patient Name: Christine [name]. Date of Service: 12/20/2019 9:15 AM Medical Record Number: [id number] Patient Account Number: [id number] Date of Birth/Sex: [birth year] (73 y.o. F) Treating RN: Army Melia Primary Care Deette Revak: Harrel Lemon Other Clinician: Referring Zylie Mumaw: Glendon Axe Treating Sarahy Creedon/Extender: Melburn Hake, HOYT Weeks in Treatment: 0 Electronic Signature(s) Signed: 12/20/2019 11:51:43 AM By: Army Melia Entered By: Army Melia on 12/20/2019 09:27:40 Christine [name] ([id number]) -------------------------------------------------------------------------------- Multi Wound Chart Details Patient Name: Christine [name]. Date of Service: 12/20/2019 9:15 AM Medical Record Number: [id number] Patient Account Number: [id number] Date of Birth/Sex: 10/18/1945 (73 y.o. F) Treating RN: Army Melia Primary Care Kosta Schnitzler: Harrel Lemon Other Clinician: Referring Laysha Childers: Glendon Axe Treating Luevenia Mcavoy/Extender: STONE III, HOYT Weeks in Treatment: 0 Vital Signs Height(in): 62 Pulse(bpm): 60 Weight(lbs): 6 Blood Pressure(mmHg): 121/58 Body Mass Index(BMI): 16 Temperature(F): 98.1 Respiratory  Rate(breaths/min): 16 Photos: [N/A:N/A] Wound Location: Left Breast N/A N/A Wounding Event: Gradually Appeared N/A N/A Primary Etiology: Malignant Wound N/A N/A Comorbid History: Cataracts, Congestive Heart Failure, N/A N/A Coronary Artery Disease, Hypertension, Myocardial Infarction, Peripheral Arterial Disease, Osteoarthritis, Received Chemotherapy Date Acquired: 06/17/2019 N/A N/A Weeks of Treatment: 0 N/A N/A Wound Status: Open N/A N/A Measurements L x W x D (cm) 3x2.5x0.1 N/A N/A Area (cm) : 5.89 N/A N/A Volume (cm) : 0.589 N/A N/A Classification: Full Thickness Without Exposed N/A N/A Support Structures Exudate Amount: Medium N/A N/A Exudate Type: Serosanguineous N/A N/A Exudate Color: red, brown N/A  N/A Granulation Amount: Small (1-33%) N/A N/A Granulation Quality: Pink N/A N/A Necrotic Amount: Large (67-100%) N/A N/A Necrotic Tissue: Eschar N/A N/A Exposed Structures: Fat Layer (Subcutaneous Tissue) N/A N/A Exposed: Yes Fascia: No Tendon: No Muscle: No Joint: No Bone: No Epithelialization: None N/A N/A Treatment Notes Electronic Signature(s) Signed: 12/20/2019 11:51:43 AM By: Army Melia Entered By: Army Melia on 12/20/2019 09:29:10 Christine [name] ([id number]) -------------------------------------------------------------------------------- [place] Details Patient Name: Christine [name], Christine [name]. Date of Service: 12/20/2019 9:15 AM Medical Record Number: [id number] Patient Account Number: [id number] Date of Birth/Sex: 1945-09-22 (73 y.o. F) Treating RN: Army Melia Primary Care Harrison Paulson: Harrel Lemon Other Clinician: Referring Yaelis Scharfenberg: Glendon Axe Treating Misheel Gowans/Extender: Melburn Hake, HOYT Weeks in Treatment: 0 Active Inactive Malignancy/Atypical Etiology Nursing Diagnoses: Knowledge deficit related to disease process and management of atypical ulcer etiology Goals: Patient/caregiver will verbalize understanding of disease process and  disease management of malignancy Date Initiated: 12/20/2019 Target Resolution Date: 01/20/2020 Goal Status: Active Interventions: Assess patient and family medical history for signs and symptoms of malignancy/atypical etiology upon admission Provide education on malignant ulcerations Notes: Orientation to the Wound Care Program Nursing Diagnoses: Knowledge deficit related to the wound healing center program Goals: Patient/caregiver will verbalize understanding of the [place] Program Date Initiated: 12/20/2019 Target Resolution Date: 01/20/2020 Goal Status: Active Interventions: Provide education on orientation to the wound center Notes: Wound/Skin Impairment Nursing Diagnoses: Impaired tissue integrity Goals: Ulcer/skin breakdown will have a volume reduction of 30% by week 4 Date Initiated: 12/20/2019 Target Resolution Date: 01/20/2020 Goal Status: Active Interventions: Assess ulceration(s) every visit Notes: Electronic Signature(s) Signed: 12/20/2019 11:51:43 AM By: Army Melia Entered By: Army Melia on 12/20/2019 09:28:57 Christine [name] ([id number]) -------------------------------------------------------------------------------- Pain Assessment Details Patient Name: Christine [name]. Date of Service: 12/20/2019 9:15 AM Medical Record Number: [id number] Patient Account Number: [id number] Date of Birth/Sex: 01-17-1946 (73 y.o. F) Treating RN: Army Melia Primary Care Jadore Veals: Harrel Lemon Other Clinician: Referring Jaeliana Lococo: Glendon Axe Treating Phillip Sandler/Extender: Melburn Hake, HOYT Weeks in Treatment: 0 Active Problems Location of Pain Severity and Description of Pain Patient Has Paino Yes Site Locations Pain Location: Pain in Ulcers Rate the pain. Current Pain Level: 8 Pain Management and Medication Current Pain Management: Electronic Signature(s) Signed: 12/20/2019 11:51:43 AM By: Army Melia Entered By: Army Melia on 12/20/2019 09:18:11 Christine [name]  ([id number]) -------------------------------------------------------------------------------- Patient/Caregiver Education Details Patient Name: Christine [name]. Date of Service: 12/20/2019 9:15 AM Medical Record Number: [id number] Patient Account Number: [id number] Date of Birth/Gender: 05-07-1946 (73 y.o. F) Treating RN: Army Melia Primary Care Physician: Harrel Lemon Other Clinician: Referring Physician: Glendon Axe Treating Physician/Extender: Sharalyn Ink in Treatment: 0 Education Assessment Education Provided To: Patient Education Topics Provided Wound/Skin Impairment: Handouts: Caring for Your Ulcer Methods: Demonstration, Explain/Verbal Responses: State content correctly Electronic Signature(s) Signed: 12/20/2019 11:51:43 AM By: Army Melia Entered By: Army Melia on 12/20/2019 09:41:44 Christine [name] ([id number]) -------------------------------------------------------------------------------- Wound Assessment Details Patient Name: Christine [name]. Date of Service: 12/20/2019 9:15 AM Medical Record Number: [id number] Patient Account Number: [id number] Date of Birth/Sex: [birth year] (73 y.o. F) Treating RN: Army Melia Primary Care Taci Sterling: Harrel Lemon Other Clinician: Referring Maysie Parkhill: Glendon Axe Treating Taelyr Jantz/Extender: STONE III, HOYT Weeks in Treatment: 0 Wound Status Wound Number: 2 Primary Malignant Wound Etiology: Wound Location: Left Breast Wound Open Wounding Event: Gradually Appeared Status: Date Acquired: 06/17/2019 Comorbid Cataracts, Congestive Heart Failure, Coronary Artery Weeks Of Treatment: 0 History: Disease, Hypertension, Myocardial Infarction, Peripheral Clustered Wound: No Arterial Disease, Osteoarthritis, Received  Chemotherapy Photos Wound Measurements Length: (cm) 3 Width: (cm) 2.5 Depth: (cm) 0.1 Area: (cm) 5.89 Volume: (cm) 0.589 % Reduction in Area: % Reduction in Volume: Epithelialization: None Tunneling:  No Undermining: No Wound Description Classification: Full Thickness Without Exposed Support Structu Exudate Amount: Medium Exudate Type: Serosanguineous Exudate Color: red, brown res Foul Odor After Cleansing: No Slough/Fibrino Yes Wound Bed Granulation Amount: Small (1-33%) Exposed Structure Granulation Quality: Pink Fascia Exposed: No Necrotic Amount: Large (67-100%) Fat Layer (Subcutaneous Tissue) Exposed: Yes Necrotic Quality: Eschar Tendon Exposed: No Muscle Exposed: No Joint Exposed: No Bone Exposed: No Treatment Notes Wound #2 (Left Breast) Notes scell, BFD Electronic Signature(s) Signed: 12/20/2019 11:51:43 AM By: Cammy Brochure ([id number]) Entered By: Army Melia on 12/20/2019 09:27:30 Christine [name] ([id number]) -------------------------------------------------------------------------------- Vitals Details Patient Name: Christine [name]. Date of Service: 12/20/2019 9:15 AM Medical Record Number: [id number] Patient Account Number: [id number] Date of Birth/Sex: [birth year] (73 y.o. F) Treating RN: Army Melia Primary Care Richar Dunklee: Harrel Lemon Other Clinician: Referring Selyna Klahn: Glendon Axe Treating Sherrine Salberg/Extender: Melburn Hake, HOYT Weeks in Treatment: 0 Vital Signs Time Taken: 09:18 Temperature (F): 98.1 Height (in): 62 Pulse (bpm): 66 Source: Stated Respiratory Rate (breaths/min): 16 Weight (lbs): 88 Blood Pressure (mmHg): 121/58 Source: Stated Reference Range: 80 - 120 mg / dl Body Mass Index (BMI): 16.1 Electronic Signature(s) Signed: 12/20/2019 11:51:43 AM By: Army Melia Entered By: Army Melia on 12/20/2019 09:18:57

## 2019-12-20 NOTE — Progress Notes (Signed)
PEIGHTON, MEHRA ([id number]) Visit Report for 12/20/2019 Chief Complaint Document Details Patient Name: Christine [name], Christine [name]. Date of Service: 12/20/2019 9:15 AM Medical Record Number: [id number] Patient Account Number: [id number] Date of Birth/Sex: [birth year] (73 y.o. F) Treating RN: Cornell Barman Primary Care Provider: Harrel Lemon Other Clinician: Referring Provider: Glendon Axe Treating Provider/Extender: Melburn Hake, Nhu Glasby Weeks in Treatment: 0 Information Obtained from: Patient Chief Complaint Left breast ulcer Electronic Signature(s) Signed: 12/20/2019 9:29:34 AM By: Worthy Keeler PA-C Entered By: Worthy Keeler on 12/20/2019 09:29:34 Christine [name] ([id number]) -------------------------------------------------------------------------------- Debridement Details Patient Name: Christine [name]. Date of Service: 12/20/2019 9:15 AM Medical Record Number: [id number] Patient Account Number: [id number] Date of Birth/Sex: [birth year] (73 y.o. F) Treating RN: Army Melia Primary Care Provider: Harrel Lemon Other Clinician: Referring Provider: Glendon Axe Treating Provider/Extender: Melburn Hake, Decklan Mau Weeks in Treatment: 0 Debridement Performed for Wound #2 Left Breast Assessment: Performed By: Physician STONE III, Doha Boling E., PA-C Debridement Type: Chemical/Enzymatic/Mechanical Agent Used: gauze and saline Level of Consciousness (Pre- Awake and Alert procedure): Pre-procedure Verification/Time Out Yes - 09:37 Taken: Start Time: 09:37 Pain Control: Lidocaine Instrument: Other : gauze and saline Bleeding: Minimum Hemostasis Achieved: Pressure End Time: 09:39 Response to Treatment: Procedure was tolerated well Level of Consciousness (Post- Awake and Alert procedure): Post Debridement Measurements of Total Wound Length: (cm) 3 Width: (cm) 2.5 Depth: (cm) 0.1 Volume: (cm) 0.589 Character of Wound/Ulcer Post Debridement: Stable Post Procedure Diagnosis Same as  Pre-procedure Electronic Signature(s) Signed: 12/20/2019 11:51:43 AM By: Army Melia Signed: 12/20/2019 5:02:19 PM By: Worthy Keeler PA-C Entered By: Army Melia on 12/20/2019 09:39:51 Christine [name] ([id number]) -------------------------------------------------------------------------------- HPI Details Patient Name: Christine [name]. Date of Service: 12/20/2019 9:15 AM Medical Record Number: [id number] Patient Account Number: [id number] Date of Birth/Sex: August 14, 1945 (73 y.o. F) Treating RN: Cornell Barman Primary Care Provider: Harrel Lemon Other Clinician: Referring Provider: Glendon Axe Treating Provider/Extender: Melburn Hake, Arriona Prest Weeks in Treatment: 0 History of Present Illness Associated Signs and Symptoms: Patient has a history of hypertension, breast cancer, she is a current tobacco user, and she does have depression. HPI Description: 03/02/18 on evaluation today patient is seen for initial evaluation here in our clinic concerning issues that she has been having with an ulcer over the left breast area involving the nipple which is a result of Carcinoma. With that being said the patient appears to have received intermittent treatment due to discontinuing follow-up at times. This was initially back in 2013 and then subsequently on July 08, 2016 she had a pet scan suggesting the possibility of metastatic lesions in her long. They were too small to characterize however. She received one dose of chemotherapy July 30, 2016 again and then subsequently did not do follow-up for any additional follow-up in the interim. She is has a 10 according to records to undergo surgery or chemotherapy. With that being said she has had the wound on her breast foot seems to be quite a while possibly even as much as a year and a half having open January 2018. 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 She was referred to [place] by Dr.  Grayland Ormond regarding the ulcer itself. Her current treatment plan was discussed to not necessarily be curative although it was the only thing she really wanted to pursue at this point. Nonetheless she does state that she understands all of this mentioned above. Nonetheless she's currently been using triple antibiotic ointment  along with a Band-Aid over the wound area. She does have discomfort in fact she really didn't want anybody touching the area today due to health painful that was. Fortunately there's no evidence of overt infection at this point. No fevers chills noted 03/16/18 on evaluation today patient actually appears to be doing fairly well in regard to her left breast wound. She has been tolerating the dressing changes specifically the silver cell without complication. She states she's having much less pain and overall feels much better with this then she has with anything previous. The wound not staying so wet has seem to have been great benefit for her. In general the patient states that she's having no other major problems and complications at this time which is good news. No fevers, chills, nausea, or vomiting noted at this time. 04/12/18 on evaluation today patient actually appears to be doing a little better in regard to her left breast ulcer. This actually appears to be for the most part dry and a lot of the drain/eschar overlying the surface of what was all a wound initially appears to be doing much better. There is very little drainage that she has noted since I last saw her about three weeks ago. Fortunately there does not appear to be any evidence of infection at this time. She seems to be tolerating the treatment that Dr. Grayland Ormond has initiated at this point without complication. 06/01/18 on evaluation today patient actually appears to be doing much better in regard to the left chest wall ulcer which has been associated with a cancerous lesion. Nonetheless she did have some eschar on  the surface of the wound region which when I gently cleanse the area pretty much came off completely and there were only two small areas it even shows evidence of an opening at this time. Overall she seems to be doing excellent which is great news. I'm not really sure what the status of her cancer is per se she is going back to see Dr. Grayland Ormond sometime soon in order to see whether or not another PET scan will be ordered. 07/09/18 on evaluation today patient presents for follow-up concerning her chest wall ulcer which again was a cancerous lesion in nature. Fortunately this appears to have completely healed and has done excellent since I first began seeing her. There is no signs of an opening at this point. Readmission: 12/20/2019 on evaluation today patient appears to be doing really about the same as when I saw her back in 2019 as well with regard to the wound on her left breast region. Again this is a cancerous lesion and mainly she develops issues with draining but then will crust up around the wound and cause it to have some trouble as far as itching and discomfort in general is concerned. She does not really know when this began to drain more after I discharged her on 16 June 2018. Nonetheless she continued to have problems with that apparently started to drain some time afterward. Right now she states she is coming back in to have me look at it to see if there is anything we can do to help and subsequently also because the DMV checks up on her regularly to make sure she is still sufficiently able to safely drive. Therefore she is trying to catch up with all of her doctors and ensure that everything is in order as they will be checking in with her shortly. Electronic Signature(s) Signed: 12/20/2019 10:44:06 AM By: Worthy Keeler  PA-C Entered By: Worthy Keeler on 12/20/2019 10:44:05 Christine [name]  ([id number]) -------------------------------------------------------------------------------- Physical Exam Details Patient Name: LAKECIA, DESCHAMPS. Date of Service: 12/20/2019 9:15 AM Medical Record Number: [id number] Patient Account Number: [id number] Date of Birth/Sex: [birth year] (73 y.o. F) Treating RN: Cornell Barman Primary Care Provider: Harrel Lemon Other Clinician: Referring Provider: Glendon Axe Treating Provider/Extender: Melburn Hake, Latavion Halls Weeks in Treatment: 0 Constitutional sitting or standing blood pressure is within target range for patient.. pulse regular and within target range for patient.Marland Kitchen respirations regular, non- labored and within target range for patient.Marland Kitchen temperature within target range for patient.. Well-nourished and well-hydrated in no acute distress. Eyes conjunctiva clear no eyelid edema noted. pupils equal round and reactive to light and accommodation. Ears, Nose, Mouth, and Throat no gross abnormality of ear auricles or external auditory canals. normal hearing noted during conversation. mucus membranes moist. Respiratory normal breathing without difficulty. Cardiovascular no clubbing, cyanosis, significant edema, <3 sec cap refill. Musculoskeletal normal gait and posture. no significant deformity or arthritic changes, no loss or range of motion, no clubbing. Psychiatric this patient is able to make decisions and demonstrates good insight into disease process. Alert and Oriented x 3. pleasant and cooperative. Notes Upon inspection patient's wound actually did have quite a bit of dry and crusty drainage noted at the site of the cancerous lesion. This again is right where the defibrillator is as well over the left breast area unfortunately. I was able to however gently and mechanically using a sterile Q-tip and gauze remove the dried crusted drainage from the surface of the wound which in the past has done well to allow for some epithelial growth and some of the  area underneath this was new epithelial tissue already. With that being said I think that this will definitely help her to feel a lot better to as far as the itching and discomfort she has been experiencing is concerned. No sharp debridement was performed and there was no significant bleeding if anything just something minimal here. Electronic Signature(s) Signed: 12/20/2019 10:45:11 AM By: Worthy Keeler PA-C Entered By: Worthy Keeler on 12/20/2019 10:45:10 Christine [name] ([id number]) -------------------------------------------------------------------------------- Physician Orders Details Patient Name: ZYRIAH, MASK. Date of Service: 12/20/2019 9:15 AM Medical Record Number: [id number] Patient Account Number: [id number] Date of Birth/Sex: Nov 23, 1945 (73 y.o. F) Treating RN: Army Melia Primary Care Provider: Harrel Lemon Other Clinician: Referring Provider: Glendon Axe Treating Provider/Extender: Melburn Hake, Raj Landress Weeks in Treatment: 0 Verbal / Phone Orders: No Diagnosis Coding ICD-10 Coding Code Description C50.912 Malignant neoplasm of unspecified site of left female breast L98.492 Non-pressure chronic ulcer of skin of other sites with fat layer exposed Z72.0 Tobacco use I10 Essential (primary) hypertension Wound Cleansing Wound #2 Left Breast o Other: - Dial antibacterial soap Primary Wound Dressing Wound #2 Left Breast o Silver Alginate Secondary Dressing Wound #2 Left Breast o Boardered Foam Dressing Dressing Change Frequency Wound #2 Left Breast o Three times weekly Follow-up Appointments Wound #2 Left Breast o Return Appointment in 1 month Electronic Signature(s) Signed: 12/20/2019 11:51:43 AM By: Army Melia Signed: 12/20/2019 5:02:19 PM By: Worthy Keeler PA-C Entered By: Army Melia on 12/20/2019 09:38:22 Christine [name] ([id number]) -------------------------------------------------------------------------------- Problem List Details Patient Name:  AVEN, CEGIELSKI. Date of Service: 12/20/2019 9:15 AM Medical Record Number: [id number] Patient Account Number: [id number] Date of Birth/Sex: [birth year] (73 y.o. 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 Date of Service: 12/20/2019 9:15 AM Medical Record Number: [id number] Patient Account Number: [id number] Date of Birth/Sex: Jan 05, 1946 (73 y.o. F) Treating RN: Cornell Barman Primary Care Provider: Harrel Lemon Other Clinician: Referring Provider: Glendon Axe Treating Provider/Extender: Melburn Hake, Punam Broussard Weeks in Treatment: 0 Subjective Chief Complaint Information obtained from Patient Left breast ulcer History of Present Illness (HPI) The following HPI elements were documented for the patient's wound: Associated Signs and Symptoms: Patient has a history of hypertension, breast cancer, she is a current tobacco user, and she does have depression. 03/02/18 on evaluation today patient is seen for initial evaluation here in our clinic concerning issues that she has been having with an ulcer over the left breast area involving the nipple which is a result of Carcinoma. With that being said  the patient appears to have received intermittent treatment due to discontinuing follow-up at times. This was initially back in 2013 and then subsequently on July 08, 2016 she had a pet scan suggesting the possibility of metastatic lesions in her long. They were too small to characterize however. She received one dose of chemotherapy July 30, 2016 again and then subsequently did not do follow-up for any additional follow-up in the interim. She is has a 10 according to records to undergo surgery or chemotherapy. With that being said she has had the wound on her breast foot seems to be quite a while possibly even as much as a year and a half having open January 2018. 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 She was referred to [place] by Dr. Grayland Ormond regarding the ulcer itself. Her current treatment plan was discussed to not necessarily be curative although it was the only thing she really wanted to pursue at this point. Nonetheless she does state that she understands all of this mentioned above. Nonetheless she's currently been using triple antibiotic ointment along with a Band-Aid over the wound area. She does have discomfort in fact she really didn't want anybody touching the area today due to health painful that was. Fortunately there's no evidence of overt infection at this point. No fevers chills noted 03/16/18 on evaluation today patient actually appears to be doing fairly well in regard to her left breast wound. She has been tolerating the dressing changes specifically the silver cell without complication. She states she's having much less pain and overall feels much better with this then she has with anything previous. The wound not staying so wet has seem to have been great benefit for her. In general the patient states that she's having no other major problems and complications at this time which is good news. No  fevers, chills, nausea, or vomiting noted at this time. 04/12/18 on evaluation today patient actually appears to be doing a little better in regard to her left breast ulcer. This actually appears to be for the most part dry and a lot of the drain/eschar overlying  the surface of what was all a wound initially appears to be doing much better. There is very little drainage that she has noted since I last saw her about three weeks ago. Fortunately there does not appear to be any evidence of infection at this time. She seems to be tolerating the treatment that Dr. Grayland Ormond has initiated at this point without complication. 06/01/18 on evaluation today patient actually appears to be doing much better in regard to the left chest wall ulcer which has been associated with a cancerous lesion. Nonetheless she did have some eschar on the surface of the wound region which when I gently cleanse the area pretty much came off completely and there were only two small areas it even shows evidence of an opening at this time. Overall she seems to be doing excellent which is great news. I'm not really sure what the status of her cancer is per se she is going back to see Dr. Grayland Ormond sometime soon in order to see whether or not another PET scan will be ordered. 07/09/18 on evaluation today patient presents for follow-up concerning her chest wall ulcer which again was a cancerous lesion in nature. Fortunately this appears to have completely healed and has done excellent since I first began seeing her. There is no signs of an opening at this point. Readmission: 12/20/2019 on evaluation today patient appears to be doing really about the same as when I saw her back in 2019 as well with regard to the wound on her left breast region. Again this is a cancerous lesion and mainly she develops issues with draining but then will crust up around the wound and cause it to have some trouble as far as itching and discomfort in general is  concerned. She does not really know when this began to drain more after I discharged her on 16 June 2018. Nonetheless she continued to have problems with that apparently started to drain some time afterward. Right now she states she is coming back in to have me look at it to see if there is anything we can do to help and subsequently also because the DMV checks up on her regularly to make sure she is still sufficiently able to safely drive. Therefore she is trying to catch up with all of her doctors and ensure that everything is in order as they will be checking in with her shortly. Patient History Information obtained from Patient. Allergies oxycodone (Reaction: restless) Family History Heart Disease - Father,Paternal Grandparents, Hypertension - Father,Child, Stroke - Mother, No family history of Cancer, Diabetes, Kidney Disease, Lung Disease, Seizures, Thyroid Problems, Tuberculosis. Social History ANINA, SCHNAKE ([id number]) Current every day smoker, Marital Status - Divorced, Alcohol Use - Never, Drug Use - No History, Caffeine Use - Daily. Medical History Eyes Patient has history of Cataracts Denies history of Glaucoma, Optic Neuritis Ear/Nose/Mouth/Throat Denies history of Chronic sinus problems/congestion, Middle ear problems Hematologic/Lymphatic Denies history of Anemia, Hemophilia, Human Immunodeficiency Virus, Lymphedema, Sickle Cell Disease Respiratory Denies history of Aspiration, Asthma, Chronic Obstructive Pulmonary Disease (COPD), Pneumothorax, Sleep Apnea, Tuberculosis Cardiovascular Patient has history of Congestive Heart Failure, Coronary Artery Disease, Hypertension, Myocardial Infarction, Peripheral Arterial Disease Denies history of Angina, Arrhythmia, Deep Vein Thrombosis, Hypotension, Peripheral Venous Disease, Phlebitis, Vasculitis Gastrointestinal Denies history of Cirrhosis , Colitis, Crohn s, Hepatitis A, Hepatitis B, Hepatitis C Endocrine Denies history  of Type I Diabetes, Type II Diabetes Genitourinary Denies history of End Stage Renal Disease Immunological Denies history of Lupus Erythematosus, Raynaud  s, Scleroderma Integumentary (Skin) Denies history of History of Burn, History of pressure wounds Musculoskeletal Patient has history of Osteoarthritis Denies history of Gout, Rheumatoid Arthritis, Osteomyelitis Neurologic Denies history of Dementia, Neuropathy, Quadriplegia, Paraplegia, Seizure Disorder Oncologic Patient has history of Received Chemotherapy Denies history of Received Radiation Psychiatric Denies history of Anorexia/bulimia, Confinement Anxiety Medical And Surgical History Notes Constitutional Symptoms (General Health) Defibulator; Cancer Stage III; HTN; High Cholesterol, Oncologic Breast Cancer-Only 2 treatment of Chemo one in 2012; one in 2018 Review of Systems (ROS) Constitutional Symptoms (General Health) Denies complaints or symptoms of Fatigue, Fever, Chills, Marked Weight Change. Eyes Denies complaints or symptoms of Dry Eyes, Vision Changes, Glasses / Contacts. Ear/Nose/Mouth/Throat Denies complaints or symptoms of Difficult clearing ears, Sinusitis. Hematologic/Lymphatic Denies complaints or symptoms of Bleeding / Clotting Disorders, Human Immunodeficiency Virus. Respiratory Denies complaints or symptoms of Chronic or frequent coughs, Shortness of Breath. Cardiovascular Denies complaints or symptoms of Chest pain, LE edema. Gastrointestinal Denies complaints or symptoms of Frequent diarrhea, Nausea, Vomiting. Endocrine Denies complaints or symptoms of Hepatitis, Thyroid disease, Polydypsia (Excessive Thirst). Genitourinary Denies complaints or symptoms of Kidney failure/ Dialysis, Incontinence/dribbling. Immunological Denies complaints or symptoms of Hives, Itching. Integumentary (Skin) Denies complaints or symptoms of Wounds, Bleeding or bruising tendency, Breakdown,  Swelling. Musculoskeletal Denies complaints or symptoms of Muscle Pain, Muscle Weakness. Neurologic Denies complaints or symptoms of Numbness/parasthesias, Focal/Weakness. Psychiatric Denies complaints or symptoms of Anxiety, Claustrophobia. SEASON, ASTACIO F. ([id number]) Objective Constitutional sitting or standing blood pressure is within target range for patient.. pulse regular and within target range for patient.Marland Kitchen respirations regular, non- labored and within target range for patient.Marland Kitchen temperature within target range for patient.. Well-nourished and well-hydrated in no acute distress. Vitals Time Taken: 9:18 AM, Height: 62 in, Source: Stated, Weight: 88 lbs, Source: Stated, BMI: 16.1, Temperature: 98.1 F, Pulse: 66 bpm, Respiratory Rate: 16 breaths/min, Blood Pressure: 121/58 mmHg. Eyes conjunctiva clear no eyelid edema noted. pupils equal round and reactive to light and accommodation. Ears, Nose, Mouth, and Throat no gross abnormality of ear auricles or external auditory canals. normal hearing noted during conversation. mucus membranes moist. Respiratory normal breathing without difficulty. Cardiovascular no clubbing, cyanosis, significant edema, Musculoskeletal normal gait and posture. no significant deformity or arthritic changes, no loss or range of motion, no clubbing. Psychiatric this patient is able to make decisions and demonstrates good insight into disease process. Alert and Oriented x 3. pleasant and cooperative. General Notes: Upon inspection patient's wound actually did have quite a bit of dry and crusty drainage noted at the site of the cancerous lesion. This again is right where the defibrillator is as well over the left breast area unfortunately. I was able to however gently and mechanically using a sterile Q-tip and gauze remove the dried crusted drainage from the surface of the wound which in the past has done well to allow for some epithelial growth and some of the  area underneath this was new epithelial tissue already. With that being said I think that this will definitely help her to feel a lot better to as far as the itching and discomfort she has been experiencing is concerned. No sharp debridement was performed and there was no significant bleeding if anything just something minimal here. Integumentary (Hair, Skin) Wound #2 status is Open. Original cause of wound was Gradually Appeared. The wound is located on the Left Breast. The wound measures 3cm length x 2.5cm width x 0.1cm depth; 5.89cm^2 area and 0.589cm^3 volume. There is Fat Layer (Subcutaneous Tissue)  Exposed exposed. There is no tunneling or undermining noted. There is a medium amount of serosanguineous drainage noted. There is small (1-33%) pink granulation within the wound bed. There is a large (67-100%) amount of necrotic tissue within the wound bed including Eschar. Assessment Active Problems ICD-10 Malignant neoplasm of unspecified site of left female breast Non-pressure chronic ulcer of skin of other sites with fat layer exposed Tobacco use Essential (primary) hypertension Procedures Wound #2 Pre-procedure diagnosis of Wound #2 is a Malignant Wound located on the Left Breast . There was a Chemical/Enzymatic/Mechanical debridement performed by STONE III, Quintavious Rinck E., PA-C. With the following instrument(s): gauze and saline after achieving pain control using Lidocaine. Other agent used was gauze and saline. A time out was conducted at 09:37, prior to the start of the procedure. A Minimum amount of bleeding was controlled with Pressure. The procedure was tolerated well. Post Debridement Measurements: 3cm length x 2.5cm width x 0.1cm depth; 0.589cm^3 volume. Character of Wound/Ulcer Post Debridement is stable. Post procedure Diagnosis Wound #2: Same as Pre-Procedure YITZEL, SHASTEEN ([id number]) Plan Wound Cleansing: Wound #2 Left Breast: Other: - Dial antibacterial soap Primary Wound  Dressing: Wound #2 Left Breast: Silver Alginate Secondary Dressing: Wound #2 Left Breast: Boardered Foam Dressing Dressing Change Frequency: Wound #2 Left Breast: Three times weekly Follow-up Appointments: Wound #2 Left Breast: Return Appointment in 1 month 1. I would recommend that we go ahead and at this point continue with the alginate dressing that previously did well as far as keeping the area clean and dry and allowing it to heal appropriately. 2. I would recommend a border foam dressing to cover as I think this will be the easiest thing for her and the patient is in agreement with that plan. We will see patient back for reevaluation in 4 weeks here in the clinic. If anything worsens or changes patient will contact our office for additional recommendations. Electronic Signature(s) Signed: 12/20/2019 10:45:33 AM By: Worthy Keeler PA-C Entered By: Worthy Keeler on 12/20/2019 10:45:32 Christine [name] ([id number]) -------------------------------------------------------------------------------- ROS/PFSH Details Patient Name: PAITLYN, MCCLATCHEY. Date of Service: 12/20/2019 9:15 AM Medical Record Number: [id number] Patient Account Number: [id number] Date of Birth/Sex: 10-03-1945 (73 y.o. F) Treating RN: Army Melia Primary Care Provider: Harrel Lemon Other Clinician: Referring Provider: Glendon Axe Treating Provider/Extender: Melburn Hake, Juanitta Earnhardt Weeks in Treatment: 0 Information Obtained From Patient Constitutional Symptoms (General Health) Complaints and Symptoms: Negative for: Fatigue; Fever; Chills; Marked Weight Change Medical History: Past Medical History Notes: Defibulator; Cancer Stage III; HTN; High Cholesterol, Eyes Complaints and Symptoms: Negative for: Dry Eyes; Vision Changes; Glasses / Contacts Medical History: Positive for: Cataracts Negative for: Glaucoma; Optic Neuritis Ear/Nose/Mouth/Throat Complaints and Symptoms: Negative for: Difficult clearing ears;  Sinusitis Medical History: Negative for: Chronic sinus problems/congestion; Middle ear problems Hematologic/Lymphatic Complaints and Symptoms: Negative for: Bleeding / Clotting Disorders; Human Immunodeficiency Virus Medical History: Negative for: Anemia; Hemophilia; Human Immunodeficiency Virus; Lymphedema; Sickle Cell Disease Respiratory Complaints and Symptoms: Negative for: Chronic or frequent coughs; Shortness of Breath Medical History: Negative for: Aspiration; Asthma; Chronic Obstructive Pulmonary Disease (COPD); Pneumothorax; Sleep Apnea; Tuberculosis Cardiovascular Complaints and Symptoms: Negative for: Chest pain; LE edema Medical History: Positive for: Congestive Heart Failure; Coronary Artery Disease; Hypertension; Myocardial Infarction; Peripheral Arterial Disease Negative for: Angina; Arrhythmia; Deep Vein Thrombosis; Hypotension; Peripheral Venous Disease; Phlebitis; Vasculitis Gastrointestinal Complaints and Symptoms: Negative for: Frequent diarrhea; Nausea; Vomiting Medical History: Negative for: Cirrhosis ; Colitis; Crohnos; Hepatitis A; Hepatitis B; Hepatitis C Renzulli, Marykatherine F. (  [id number]) Endocrine Complaints and Symptoms: Negative for: Hepatitis; Thyroid disease; Polydypsia (Excessive Thirst) Medical History: Negative for: Type I Diabetes; Type II Diabetes Genitourinary Complaints and Symptoms: Negative for: Kidney failure/ Dialysis; Incontinence/dribbling Medical History: Negative for: End Stage Renal Disease Immunological Complaints and Symptoms: Negative for: Hives; Itching Medical History: Negative for: Lupus Erythematosus; Raynaudos; Scleroderma Integumentary (Skin) Complaints and Symptoms: Negative for: Wounds; Bleeding or bruising tendency; Breakdown; Swelling Medical History: Negative for: History of Burn; History of pressure wounds Musculoskeletal Complaints and Symptoms: Negative for: Muscle Pain; Muscle Weakness Medical History: Positive  for: Osteoarthritis Negative for: Gout; Rheumatoid Arthritis; Osteomyelitis Neurologic Complaints and Symptoms: Negative for: Numbness/parasthesias; Focal/Weakness Medical History: Negative for: Dementia; Neuropathy; Quadriplegia; Paraplegia; Seizure Disorder Psychiatric Complaints and Symptoms: Negative for: Anxiety; Claustrophobia Medical History: Negative for: Anorexia/bulimia; Confinement Anxiety Oncologic Medical History: Positive for: Received Chemotherapy Negative for: Received Radiation Past Medical History Notes: Breast Cancer-Only 2 treatment of Chemo one in 2012; one in 2018 HBO Extended History Items Eyes: Cataracts Immunizations CARAL, WHAN ([id number]) Pneumococcal Vaccine: Received Pneumococcal Vaccination: Yes Implantable Devices Yes Family and Social History Cancer: No; Diabetes: No; Heart Disease: Yes - Father,Paternal Grandparents; Hypertension: Yes - Father,Child; Kidney Disease: No; Lung Disease: No; Seizures: No; Stroke: Yes - Mother; Thyroid Problems: No; Tuberculosis: No; Current every day smoker; Marital Status - Divorced; Alcohol Use: Never; Drug Use: No History; Caffeine Use: Daily; Financial Concerns: No; Food, Clothing or Shelter Needs: No; Support System Lacking: No; Transportation Concerns: No Electronic Signature(s) Signed: 12/20/2019 11:51:43 AM By: Army Melia Signed: 12/20/2019 5:02:19 PM By: Worthy Keeler PA-C Entered By: Army Melia on 12/20/2019 09:22:08 Christine [name] ([id number]) -------------------------------------------------------------------------------- SuperBill Details Patient Name: SHARY, LAMOS. Date of Service: 12/20/2019 Medical Record Number: [id number] Patient Account Number: [id number] Date of Birth/Sex: [birth year] (73 y.o. F) Treating RN: Cornell Barman Primary Care Provider: Harrel Lemon Other Clinician: Referring Provider: Glendon Axe Treating Provider/Extender: Melburn Hake, Denham Mose Weeks in Treatment: 0 Diagnosis  Coding ICD-10 Codes Code Description C50.912 Malignant neoplasm of unspecified site of left female breast L98.492 Non-pressure chronic ulcer of skin of other sites with fat layer exposed Z72.0 Tobacco use I10 Essential (primary) hypertension Facility Procedures CPT4 Code: 91505697 Description: 99214 - WOUND CARE VISIT-LEV 4 EST PT Modifier: Quantity: 1 CPT4 Code: 94801655 Description: 37482 - DEBRIDE W/O ANES NON SELECT Modifier: Quantity: 1 Physician Procedures CPT4 Code: 7078675 Description: 44920 - WC PHYS LEVEL 3 - EST PT Modifier: Quantity: 1 CPT4 Code: Description: ICD-10 Diagnosis Description C50.912 Malignant neoplasm of unspecified site of left female breast L98.492 Non-pressure chronic ulcer of skin of other sites with fat layer expos Z72.0 Tobacco use I10 Essential (primary) hypertension Modifier: ed Quantity: Electronic Signature(s) Signed: 12/20/2019 10:46:13 AM By: Worthy Keeler PA-C Entered By: Worthy Keeler on 12/20/2019 10:46:13

## 2020-01-17 ENCOUNTER — Ambulatory Visit: Payer: Medicare Other | Admitting: Physician Assistant

## 2020-01-22 ENCOUNTER — Other Ambulatory Visit: Payer: Self-pay | Admitting: Oncology

## 2020-01-22 DIAGNOSIS — Z170 Estrogen receptor positive status [ER+]: Secondary | ICD-10-CM

## 2020-01-31 ENCOUNTER — Encounter: Payer: Self-pay | Admitting: Internal Medicine

## 2020-01-31 ENCOUNTER — Other Ambulatory Visit: Payer: Self-pay

## 2020-01-31 ENCOUNTER — Ambulatory Visit (INDEPENDENT_AMBULATORY_CARE_PROVIDER_SITE_OTHER): Payer: Medicare Other | Admitting: Internal Medicine

## 2020-01-31 VITALS — BP 142/80 | HR 66 | Ht 62.0 in | Wt 87.0 lb

## 2020-01-31 DIAGNOSIS — Z95810 Presence of automatic (implantable) cardiac defibrillator: Secondary | ICD-10-CM | POA: Diagnosis not present

## 2020-01-31 DIAGNOSIS — I255 Ischemic cardiomyopathy: Secondary | ICD-10-CM | POA: Diagnosis not present

## 2020-01-31 DIAGNOSIS — I5022 Chronic systolic (congestive) heart failure: Secondary | ICD-10-CM

## 2020-01-31 DIAGNOSIS — I10 Essential (primary) hypertension: Secondary | ICD-10-CM

## 2020-01-31 NOTE — Patient Instructions (Signed)
Medication Instructions:  - Your physician recommends that you continue on your current medications as directed. Please refer to the Current Medication list given to you today.  *If you need a refill on your cardiac medications before your next appointment, please call your pharmacy*   Lab Work: - none ordered  If you have labs (blood work) drawn today and your tests are completely normal, you will receive your results only by: . MyChart Message (if you have MyChart) OR . A paper copy in the mail If you have any lab test that is abnormal or we need to change your treatment, we will call you to review the results.   Testing/Procedures: - none ordered   Follow-Up: At [facility], you and your health needs are our priority.  As part of our continuing mission to provide you with exceptional heart care, we have created designated Provider Care Teams.  These Care Teams include your primary Cardiologist (physician) and Advanced Practice Providers (APPs -  Physician Assistants and Nurse Practitioners) who all work together to provide you with the care you need, when you need it.  We recommend signing up for the patient portal called "MyChart".  Sign up information is provided on this After Visit Summary.  MyChart is used to connect with patients for Virtual Visits (Telemedicine).  Patients are able to view lab/test results, encounter notes, upcoming appointments, etc.  Non-urgent messages can be sent to your provider as well.   To learn more about what you can do with MyChart, go to [email or web address].    Your next appointment:   As needed   The format for your next appointment:   n/a  Provider:   Steven Klein, MD   Other Instructions n/a  

## 2020-01-31 NOTE — Progress Notes (Signed)
Patient Care Team: Baxter Hire, MD as PCP - General (Internal Medicine)   HPI  Christine [name] is a 74 y.o. female Seen in followup for an ICD has been EOS for a number of years.    She has a history of breast cancer and had been discussions ongoing for years as to what to do about her cancer surgery and possible repositioning of her defibrillator.  Have not seen her since 2019.  Notes were reviewed from the wound clinic and there is an ongoing malignant draining from her breast.  Has seen oncology intermittently.  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  Continues to not follow-up with oncology.    Echocardiogram 2012 demonstrated impaired LV function, i.e. 25-30% with apical thrombus.     Echocardiogram 9/15 demonstrated ejection fraction of 30-35% with a mural thrombus. No stalk was described. Myoview scan demonstrated similar ejection fraction with inferobasilar ischemia and a large infarct.     Stable sob; no syncope; no chest pain   No specific cardiovascular complaints  Here for DOT form  Past Medical History:  Diagnosis Date  . Allergic rhinitis   . Anxiety   . Breast cancer ([place])   . Chronic systolic heart failure ([place])   . Coronary artery disease   . GERD (gastroesophageal reflux disease)   . Heart murmur   . Hyperlipidemia   . Hypertension   . Implantable defibrillator St Jude    DOI 2006  . MI (myocardial infarction) ([place])   . Osteoarthritis     Past Surgical History:  Procedure Laterality Date  . CHF exacerbation  10/07  . CORONARY STENT PLACEMENT  8/06   3 stents; myocardial infarction  . LOWER EXTREMITY ANGIOGRAPHY Left 02/15/2019   Procedure: LOWER EXTREMITY ANGIOGRAPHY;  Surgeon: Katha Cabal, MD;  Location: [place] [facility];  Service: Cardiovascular;  Laterality: Left;  . NM MYOVIEW LTD  1/08   EF 39%, no sx ischemia  . PACEMAKER PLACEMENT  2/07   Defibrillator  . RENAL  ARTERY STENT Left     Current Outpatient Medications  Medication Sig Dispense Refill  . albuterol (VENTOLIN HFA) 108 (90 Base) MCG/ACT inhaler Inhale 2 puffs into the lungs 4 (four) times daily as needed (wheezing/shortness of breath).     Marland Kitchen aspirin EC 81 MG tablet Take 81 mg by mouth daily.    . bisacodyl (DULCOLAX) 5 MG EC tablet Take 5 mg by mouth daily as needed for mild constipation or moderate constipation.    . carvedilol (COREG) 25 MG tablet TAKE 1 TABLET BY MOUTH TWICE A DAY (Patient taking differently: Take 25 mg by mouth 2 (two) times daily. ) 60 tablet 2  . Cholecalciferol (VITAMIN D) 2000 UNITS tablet Take 2,000 Units by mouth daily.    . clopidogrel (PLAVIX) 75 MG tablet TAKE 1 TABLET BY MOUTH ONCE DAILY 30 tablet 4  . dicyclomine (BENTYL) 20 MG tablet Take by mouth.    . Flaxseed, Linseed, (FLAXSEED OIL) 1000 MG CAPS Take 1,000 mg by mouth daily.    . furosemide (LASIX) 40 MG tablet Take 40 mg by mouth daily as needed (fluid retention/swelling).    Marland Kitchen HYDROcodone-acetaminophen (NORCO) 5-325 MG tablet Take 0.5 tablets by mouth every 6 (six) hours as needed for moderate pain. 10 tablet 0  . loratadine (CLARITIN) 10 MG tablet Take 10 mg by mouth daily as needed for allergies.     Marland Kitchen  LORazepam (ATIVAN) 0.5 MG tablet Take 0.25 mg by mouth 2 (two) times daily.     . Multiple Vitamin (ANTIOXIDANT FORMULA PO) Take 1 tablet by mouth daily.    . Multiple Vitamins-Minerals (HEALTHY EYES/LUTEIN PO) Take 1 tablet by mouth daily.    . pantoprazole (PROTONIX) 40 MG tablet Take 40 mg by mouth daily.     . Polyethylene Glycol 3350 (PEG 3350) POWD Take 17 g by mouth daily as needed (constipation.).     Marland Kitchen rosuvastatin (CRESTOR) 10 MG tablet Take 10 mg by mouth daily.     . tamoxifen (NOLVADEX) 20 MG tablet Take 1 tablet (20 mg total) by mouth daily. (Patient taking differently: Take 20 mg by mouth daily at 12 noon. ) 90 tablet 3  . tetrahydrozoline 0.05 % ophthalmic solution Place 1 drop into both  eyes 3 (three) times daily as needed (dry/irritated eyes).    . traMADol (ULTRAM) 50 MG tablet Take 25 mg by mouth 2 (two) times daily.    . valsartan (DIOVAN) 320 MG tablet Take 320 mg by mouth daily at 12 noon.     . venlafaxine XR (EFFEXOR-XR) 37.5 MG 24 hr capsule Take 37.5 mg by mouth daily.     No current facility-administered medications for this visit.    Allergies  Allergen Reactions  . Atorvastatin Other (See Comments)    increased LFT's  . Lisinopril Cough  . Oxycodone Hcl Itching and Other (See Comments)    Restless & jittery  . Simvastatin Other (See Comments)    myalgias    Review of Systems negative except from HPI and PMH  Physical Exam BP (!) 142/80 (BP Location: Left Arm, Patient Position: Sitting, Cuff Size: Normal)   Pulse 66   Ht 5\' 2"  (1.575 m)   Wt 87 lb (39.5 kg)   SpO2 99%   BMI 15.91 kg/m   Tiny and thin woman in no acute distress HENT normal Neck supple  L breast is dressed and was not undressed for the visit Clear  Regular rate and rhythm, no murmurs or gallops Abd-soft  No Clubbing cyanosis edema Skin-warm and dry A & Oriented  Grossly normal sensory and motor function    ECG sinus @ 66 1209/46  Assessment and  Plan  Ischemic cardiomyopathy  Breast cancer  Implantable defibrillator at EOS Dual chamber   Hypertension   no ischemic  BP reasonably controlled  Device is abandoned   No interval syncope  DOT form fillled out

## 2020-02-02 ENCOUNTER — Ambulatory Visit: Payer: Medicare Other | Admitting: Internal Medicine

## 2020-02-07 ENCOUNTER — Other Ambulatory Visit: Payer: Self-pay | Admitting: Oncology

## 2020-02-07 DIAGNOSIS — Z170 Estrogen receptor positive status [ER+]: Secondary | ICD-10-CM

## 2020-05-04 DIAGNOSIS — H2513 Age-related nuclear cataract, bilateral: Secondary | ICD-10-CM | POA: Diagnosis not present

## 2020-05-04 DIAGNOSIS — H353132 Nonexudative age-related macular degeneration, bilateral, intermediate dry stage: Secondary | ICD-10-CM | POA: Diagnosis not present

## 2020-07-09 ENCOUNTER — Other Ambulatory Visit: Admission: RE | Admit: 2020-07-09 | Payer: Medicare Other | Source: Ambulatory Visit

## 2020-07-09 NOTE — Discharge Instructions (Signed)

## 2020-07-11 ENCOUNTER — Encounter: Admission: RE | Payer: Self-pay | Source: Home / Self Care

## 2020-07-11 ENCOUNTER — Ambulatory Visit: Admission: RE | Admit: 2020-07-11 | Payer: Medicare Other | Source: Home / Self Care | Admitting: Ophthalmology

## 2020-07-11 SURGERY — PHACOEMULSIFICATION, CATARACT, WITH IOL INSERTION
Anesthesia: Topical | Laterality: Right

## 2020-07-24 ENCOUNTER — Other Ambulatory Visit: Payer: Self-pay | Admitting: Internal Medicine

## 2020-07-24 DIAGNOSIS — R0789 Other chest pain: Secondary | ICD-10-CM

## 2020-07-25 ENCOUNTER — Ambulatory Visit: Admit: 2020-07-25 | Payer: Medicare Other | Admitting: Ophthalmology

## 2020-07-25 SURGERY — PHACOEMULSIFICATION, CATARACT, WITH IOL INSERTION
Anesthesia: Topical | Laterality: Left

## 2020-09-22 ENCOUNTER — Other Ambulatory Visit (INDEPENDENT_AMBULATORY_CARE_PROVIDER_SITE_OTHER): Payer: Self-pay | Admitting: Vascular Surgery

## 2020-10-30 ENCOUNTER — Other Ambulatory Visit (INDEPENDENT_AMBULATORY_CARE_PROVIDER_SITE_OTHER): Payer: Self-pay | Admitting: Vascular Surgery

## 2020-10-30 NOTE — Telephone Encounter (Signed)
Pt has not been seen in our office since 04/2019 with Dr. Delana Meyer an has no upcoming appointments is this ok to fill?

## 2020-10-30 NOTE — Telephone Encounter (Signed)
Unfortunately, the patient was supposed to have intervention to her abdominal aortic aneurysm and she canceled.  She also has pretty extensive atherosclerotic disease.  The patient should come in with an aortoiliac as well as ABI before any refills can be ordered.

## 2020-10-31 ENCOUNTER — Telehealth (INDEPENDENT_AMBULATORY_CARE_PROVIDER_SITE_OTHER): Payer: Self-pay

## 2020-10-31 NOTE — Telephone Encounter (Signed)
I tried to call the pt  And make her aware that before we can refill her blood thinner  The patient should come in with an aortoiliac as well as ABI  The pt's phone number is not working at the moment.

## 2020-11-01 ENCOUNTER — Other Ambulatory Visit (INDEPENDENT_AMBULATORY_CARE_PROVIDER_SITE_OTHER): Payer: Self-pay | Admitting: Vascular Surgery

## 2020-11-26 ENCOUNTER — Encounter: Payer: Self-pay | Admitting: Ophthalmology

## 2020-11-26 ENCOUNTER — Encounter: Payer: Self-pay | Admitting: Anesthesiology

## 2020-12-05 ENCOUNTER — Encounter
Admission: RE | Disposition: A | Discharge status: Home / Self Care | Payer: Self-pay | Source: Home / Self Care | Attending: Ophthalmology

## 2020-12-05 ENCOUNTER — Ambulatory Visit
Admission: RE | Admit: 2020-12-05 | Discharge: 2020-12-05 | Disposition: A | Discharge status: Home / Self Care | Payer: Medicare Other | Attending: Ophthalmology | Admitting: Ophthalmology

## 2020-12-05 ENCOUNTER — Encounter: Payer: Self-pay | Admitting: Ophthalmology

## 2020-12-05 DIAGNOSIS — H269 Unspecified cataract: Secondary | ICD-10-CM | POA: Insufficient documentation

## 2020-12-05 DIAGNOSIS — Z538 Procedure and treatment not carried out for other reasons: Secondary | ICD-10-CM | POA: Diagnosis not present

## 2020-12-05 HISTORY — DX: Presence of dental prosthetic device (complete) (partial): Z97.2

## 2020-12-05 SURGERY — PHACOEMULSIFICATION, CATARACT, WITH IOL INSERTION
Anesthesia: Topical

## 2020-12-05 SURGICAL SUPPLY — 24 items
CANNULA ANT/CHMB 27G (MISCELLANEOUS) ×1 IMPLANT
CANNULA ANT/CHMB 27GA (MISCELLANEOUS) ×2 IMPLANT
GLOVE SURG ENC TEXT LTX SZ7.5 (GLOVE) ×3 IMPLANT
GLOVE SURG TRIUMPH 8.0 PF LTX (GLOVE) ×3 IMPLANT
GOWN STRL REUS W/ TWL LRG LVL3 (GOWN DISPOSABLE) ×4 IMPLANT
GOWN STRL REUS W/TWL LRG LVL3 (GOWN DISPOSABLE) ×4
MARKER SKIN DUAL TIP RULER LAB (MISCELLANEOUS) ×3 IMPLANT
NDL CAPSULORHEX 25GA (NEEDLE) ×1 IMPLANT
NDL FILTER BLUNT 18X1 1/2 (NEEDLE) ×2 IMPLANT
NDL RETROBULBAR .5 NSTRL (NEEDLE) IMPLANT
NEEDLE CAPSULORHEX 25GA (NEEDLE) ×2 IMPLANT
NEEDLE FILTER BLUNT 18X 1/2SAF (NEEDLE) ×2
NEEDLE FILTER BLUNT 18X1 1/2 (NEEDLE) ×2 IMPLANT
PACK EYE AFTER SURG (MISCELLANEOUS) ×3 IMPLANT
RING MALYGIN 7.0 (MISCELLANEOUS) IMPLANT
SOLUTION OPHTHALMIC SALT (MISCELLANEOUS) ×3 IMPLANT
SUT ETHILON 10-0 CS-B-6CS-B-6 (SUTURE)
SUT VICRYL  9 0 (SUTURE)
SUT VICRYL 9 0 (SUTURE) IMPLANT
SUTURE EHLN 10-0 CS-B-6CS-B-6 (SUTURE) IMPLANT
SYR 3ML LL SCALE MARK (SYRINGE) ×6 IMPLANT
SYR TB 1ML LUER SLIP (SYRINGE) ×3 IMPLANT
WATER STERILE IRR 250ML POUR (IV SOLUTION) ×3 IMPLANT
WIPE NON LINTING 3.25X3.25 (MISCELLANEOUS) ×3 IMPLANT

## 2020-12-05 NOTE — H&P (Signed)
[place]   Primary Care Physician:  Baxter Hire, MD Ophthalmologist: Dr. Leandrew Koyanagi  Pre-Procedure History & Physical: HPI:  Christine [name] is a 75 y.o. female here for ophthalmic surgery. Patient ate and surgery canceled.    Past Medical History:  Diagnosis Date   Allergic rhinitis    Anxiety    Breast cancer ([place])    11/26/20 Pt reports wound/lesion on left breast   Chronic systolic heart failure ([facility])    Coronary artery disease    GERD (gastroesophageal reflux disease)    Heart murmur    Hyperlipidemia    Hypertension    Implantable defibrillator St Jude    DOI 2006   MI (myocardial infarction) [facility])    Osteoarthritis    Wears dentures    full upper and lower    Past Surgical History:  Procedure Laterality Date   CHF exacerbation  10/07   CORONARY STENT PLACEMENT  8/06   3 stents; myocardial infarction   LOWER EXTREMITY ANGIOGRAPHY Left 02/15/2019   Procedure: LOWER EXTREMITY ANGIOGRAPHY;  Surgeon: Katha Cabal, MD;  Location: [place] [facility];  Service: Cardiovascular;  Laterality: Left;   NM MYOVIEW LTD  1/08   EF 39%, no sx ischemia   PACEMAKER PLACEMENT  2/07   Defibrillator   RENAL ARTERY STENT Left     Prior to Admission medications   Medication Sig Start Date End Date Taking? Authorizing Provider  albuterol (VENTOLIN HFA) 108 (90 Base) MCG/ACT inhaler Inhale 2 puffs into the lungs 4 (four) times daily as needed (wheezing/shortness of breath). 03/31/17  Yes [provider]  aspirin EC 81 MG tablet Take 81 mg by mouth daily.   Yes [provider]  bisacodyl (DULCOLAX) 5 MG EC tablet Take 5 mg by mouth daily as needed for mild constipation or moderate constipation.   Yes [provider]  carvedilol (COREG) 25 MG tablet TAKE 1 TABLET BY MOUTH TWICE A DAY Patient taking differently: Take 25 mg by mouth 2 (two) times daily. 09/09/10  Yes Venia Carbon, MD  clopidogrel (PLAVIX) 75 MG tablet TAKE 1 TABLET  BY MOUTH ONCE DAILY 12/05/19  Yes Schnier, Dolores Lory, MD  furosemide (LASIX) 40 MG tablet Take 40 mg by mouth daily as needed (fluid retention/swelling).   Yes [provider]  LORazepam (ATIVAN) 0.5 MG tablet Take 0.25 mg by mouth 2 (two) times daily.  03/13/17  Yes [provider]  pantoprazole (PROTONIX) 40 MG tablet Take 40 mg by mouth daily.  02/25/17  Yes [provider]  Polyethylene Glycol 3350 (PEG 3350) POWD Take 17 g by mouth daily as needed (constipation.).    Yes [provider]  rosuvastatin (CRESTOR) 10 MG tablet Take 10 mg by mouth daily.    Yes [provider]  tamoxifen (NOLVADEX) 20 MG tablet Take 1 tablet (20 mg total) by mouth daily. Patient taking differently: Take 20 mg by mouth daily at 12 noon. 12/29/18  Yes Lloyd Huger, MD  traMADol (ULTRAM) 50 MG tablet Take 25 mg by mouth 2 (two) times daily.   Yes [provider]  valsartan (DIOVAN) 320 MG tablet Take 320 mg by mouth daily at 12 noon.    Yes [provider]  venlafaxine XR (EFFEXOR-XR) 37.5 MG 24 hr capsule Take 37.5 mg by mouth daily. 01/23/20  Yes [provider]  tetrahydrozoline 0.05 % ophthalmic solution Place 1 drop into both eyes 3 (three) times daily as needed (dry/irritated  eyes).    [provider]    Allergies as of 11/07/2020 - Review Complete 01/31/2020  Allergen Reaction Noted   Atorvastatin Other (See Comments) 07/17/2006   Lisinopril Cough 07/17/2006   Oxycodone hcl Itching and Other (See Comments) 07/17/2006   Simvastatin Other (See Comments) 07/17/2006    Family History  Problem Relation Age of Onset   Stroke Mother 42   Heart attack Father    Coronary artery disease Father    Heart attack Brother    Coronary artery disease Other    Hypertension Other    Cancer Neg Hx        No breast or colon cancer    Social History   Socioeconomic History   Marital status: Divorced    Spouse name: Not on file    Number of children: 1   Years of education: Not on file   Highest education level: Not on file  Occupational History   Occupation: Artist    Comment: Disabled  Tobacco Use   Smoking status: Every Day    Packs/day: 0.50    Years: 12.00    Pack years: 6.00    Types: Cigarettes   Smokeless tobacco: Never   Tobacco comments:       Vaping Use   Vaping Use: Never used  Substance and Sexual Activity   Alcohol use: No    Comment: 2-3 times per month   Drug use: No   Sexual activity: Not on file  Other Topics Concern   Not on file  Social History Narrative   Pt is disabled but is hoping to back to work part time.   Has been divorced twice.   Gets regular exercise.   Social Determinants of Health   Financial Resource Strain: Not on file  Food Insecurity: Not on file  Transportation Needs: Not on file  Physical Activity: Not on file  Stress: Not on file  Social Connections: Not on file  Intimate Partner Violence: Not on file    Review of Systems: See HPI, otherwise negative ROS  Physical Exam: BP (!) 173/100   Pulse 71   Temp 98.1 F (36.7 C) (Temporal)   Resp 20   Ht 5\' 2"  (1.575 m)   Wt 39.5 kg   SpO2 99%   BMI 15.91 kg/m  General:   Alert,  pleasant and cooperative in NAD Head:  Normocephalic and atraumatic. Lungs:  Clear to auscultation.    Heart:  Regular rate and rhythm.   Impression/Plan: Christine [name] is here for ophthalmic surgery.  Risks, benefits, limitations, and alternatives regarding ophthalmic surgery have been reviewed with the patient.  Questions have been answered.  All parties agreeable.   Leandrew Koyanagi, MD  12/05/2020, 1:38 PM

## 2020-12-26 ENCOUNTER — Encounter: Admission: RE | Payer: Self-pay | Source: Home / Self Care

## 2020-12-26 SURGERY — PHACOEMULSIFICATION, CATARACT, WITH IOL INSERTION
Anesthesia: Topical | Laterality: Right

## 2020-12-27 ENCOUNTER — Other Ambulatory Visit: Payer: Self-pay

## 2020-12-27 ENCOUNTER — Encounter: Payer: Medicare Other | Attending: Physician Assistant | Admitting: Physician Assistant

## 2020-12-27 DIAGNOSIS — Z885 Allergy status to narcotic agent status: Secondary | ICD-10-CM | POA: Diagnosis not present

## 2020-12-27 DIAGNOSIS — Z9221 Personal history of antineoplastic chemotherapy: Secondary | ICD-10-CM | POA: Insufficient documentation

## 2020-12-27 DIAGNOSIS — Z853 Personal history of malignant neoplasm of breast: Secondary | ICD-10-CM | POA: Diagnosis not present

## 2020-12-27 DIAGNOSIS — F17200 Nicotine dependence, unspecified, uncomplicated: Secondary | ICD-10-CM | POA: Diagnosis not present

## 2020-12-27 DIAGNOSIS — L98492 Non-pressure chronic ulcer of skin of other sites with fat layer exposed: Secondary | ICD-10-CM | POA: Insufficient documentation

## 2020-12-27 DIAGNOSIS — I110 Hypertensive heart disease with heart failure: Secondary | ICD-10-CM | POA: Insufficient documentation

## 2020-12-27 DIAGNOSIS — I509 Heart failure, unspecified: Secondary | ICD-10-CM | POA: Insufficient documentation

## 2020-12-27 NOTE — Progress Notes (Signed)
Christine [name], Christine [name] ([id number]) Visit Report for 12/27/2020 Abuse/Suicide Risk Screen Details Patient Name: Christine [name], Christine [name]. Date of Service: 12/27/2020 10:15 AM Medical Record Number: [id number] Patient Account Number: [id number] Date of Birth/Sex: August 30, 1945 (75 y.o. F) Treating RN: Dolan Amen Primary Care Endre Coutts: Harrel Lemon Other Clinician: Referring Jomel Whittlesey: Referral, Self Treating Stryder Poitra/Extender: Skipper Cliche in Treatment: 0 Abuse/Suicide Risk Screen Items Answer ABUSE RISK SCREEN: Has anyone close to you tried to hurt or harm you recentlyo No Do you feel uncomfortable with anyone in your familyo No Has anyone forced you do things that you didnot want to doo No Electronic Signature(s) Signed: 12/27/2020 4:28:21 PM By: Dolan Amen RN Entered By: Dolan Amen on 12/27/2020 10:46:28 Christine [name] ([id number]) -------------------------------------------------------------------------------- Activities of Daily Living Details Patient Name: Christine [name]. Date of Service: 12/27/2020 10:15 AM Medical Record Number: [id number] Patient Account Number: [id number] Date of Birth/Sex: 03-12-1946 (75 y.o. F) Treating RN: Dolan Amen Primary Care Ozzy Bohlken: Harrel Lemon Other Clinician: Referring Mahmood Boehringer: Referral, Self Treating Jasmina Gendron/Extender: Skipper Cliche in Treatment: 0 Activities of Daily Living Items Answer Activities of Daily Living (Please select one for each item) Drive Automobile Not Able Take Medications Completely Able Use Telephone Completely Able Care for Appearance Completely Able Use Toilet Completely Able Bath / Shower Completely Able Dress Self Completely Able Feed Self Completely Able Walk Completely Able Get In / Out Bed Completely Able Housework Completely Able Prepare Meals Completely Able Handle Money Completely Able Shop for Self Completely Able Electronic Signature(s) Signed: 12/27/2020 4:28:21 PM By: Dolan Amen  RN Entered By: Dolan Amen on 12/27/2020 10:46:47 Christine [name] ([id number]) -------------------------------------------------------------------------------- Education Screening Details Patient Name: Christine [name]. Date of Service: 12/27/2020 10:15 AM Medical Record Number: [id number] Patient Account Number: [id number] Date of Birth/Sex: Jul 28, 1945 (75 y.o. F) Treating RN: Dolan Amen Primary Care Sidnee Gambrill: Harrel Lemon Other Clinician: Referring Rondalyn Belford: Referral, Self Treating Darrion Macaulay/Extender: Skipper Cliche in Treatment: 0 Primary Learner Assessed: Patient Learning Preferences/Education Level/Primary Language Learning Preference: Explanation, Demonstration Highest Education Level: High School Preferred Language: English Cognitive Barrier Language Barrier: No Translator Needed: No Memory Deficit: No Emotional Barrier: No Cultural/Religious Beliefs Affecting Medical Care: No Physical Barrier Impaired Vision: No Impaired Hearing: No Decreased Hand dexterity: No Knowledge/Comprehension Knowledge Level: Medium Comprehension Level: Medium Ability to understand written instructions: Medium Ability to understand verbal instructions: Medium Motivation Anxiety Level: Calm Cooperation: Cooperative Education Importance: Acknowledges Need Interest in Health Problems: Asks Questions Perception: Coherent Willingness to Engage in Self-Management Medium Activities: Readiness to Engage in Self-Management Medium Activities: Electronic Signature(s) Signed: 12/27/2020 4:28:21 PM By: Dolan Amen RN Entered By: Dolan Amen on 12/27/2020 10:47:44 Christine [name] ([id number]) -------------------------------------------------------------------------------- Fall Risk Assessment Details Patient Name: Christine [name]. Date of Service: 12/27/2020 10:15 AM Medical Record Number: [id number] Patient Account Number: [id number] Date of Birth/Sex: January 31, 1946 (75 y.o.  F) Treating RN: Dolan Amen Primary Care Kamdyn Covel: Harrel Lemon Other Clinician: Referring Antonette Hendricks: Referral, Self Treating Clothilde Tippetts/Extender: Skipper Cliche in Treatment: 0 Fall Risk Assessment Items Have you had 2 or more falls in the last 12 monthso 0 No Have you had any fall that resulted in injury in the last 12 monthso 0 No FALLS RISK SCREEN History of falling - immediate or within 3 months 0 No Secondary diagnosis (Do you have 2 or more medical diagnoseso) 15 Yes Ambulatory aid None/bed rest/wheelchair/nurse 0 Yes Crutches/cane/walker 0 No Furniture 0 No Intravenous therapy Access/Saline/Heparin Lock 0 No Gait/Transferring Normal/ bed rest/ wheelchair  0 Yes Weak (short steps with or without shuffle, stooped but able to lift head while walking, may 0 No seek support from furniture) Impaired (short steps with shuffle, may have difficulty arising from chair, head down, impaired 0 No balance) Mental Status Oriented to own ability 0 Yes Electronic Signature(s) Signed: 12/27/2020 4:28:21 PM By: Dolan Amen RN Entered By: Dolan Amen on 12/27/2020 10:47:55 Christine [name] ([id number]) -------------------------------------------------------------------------------- Foot Assessment Details Patient Name: Christine [name]. Date of Service: 12/27/2020 10:15 AM Medical Record Number: [id number] Patient Account Number: [id number] Date of Birth/Sex: 05/19/1946 (75 y.o. F) Treating RN: Dolan Amen Primary Care Matia Zelada: Harrel Lemon Other Clinician: Referring Lynsie Mcwatters: Referral, Self Treating Kentrell Guettler/Extender: Skipper Cliche in Treatment: 0 Foot Assessment Items Site Locations + = Sensation present, - = Sensation absent, C = Callus, U = Ulcer R = Redness, W = Warmth, M = Maceration, PU = Pre-ulcerative lesion F = Fissure, S = Swelling, D = Dryness Assessment Right: Left: Other Deformity: No No Prior Foot Ulcer: No No Prior Amputation: No No Charcot  Joint: No No Ambulatory Status: Ambulatory Without Help Gait: Steady Electronic Signature(s) Signed: 12/27/2020 4:28:21 PM By: Dolan Amen RN Entered By: Dolan Amen on 12/27/2020 10:50:16 Christine [name] ([id number]) -------------------------------------------------------------------------------- Nutrition Risk Screening Details Patient Name: Christine [name]. Date of Service: 12/27/2020 10:15 AM Medical Record Number: [id number] Patient Account Number: [id number] Date of Birth/Sex: Feb 07, 1946 (75 y.o. F) Treating RN: Dolan Amen Primary Care Eleazar Kimmey: Harrel Lemon Other Clinician: Referring Sarahjane Matherly: Referral, Self Treating Hopelynn Gartland/Extender: Skipper Cliche in Treatment: 0 Height (in): 62 Weight (lbs): 82 Body Mass Index (BMI): 15 Nutrition Risk Screening Items Score Screening NUTRITION RISK SCREEN: I have an illness or condition that made me change the kind and/or amount of food I eat 2 Yes I eat fewer than two meals per day 3 Yes I eat few fruits and vegetables, or milk products 2 Yes I have three or more drinks of beer, liquor or wine almost every day 0 No I have tooth or mouth problems that make it hard for me to eat 0 No I don't always have enough money to buy the food I need 0 No I eat alone most of the time 1 Yes I take three or more different prescribed or over-the-counter drugs a day 1 Yes Without wanting to, I have lost or gained 10 pounds in the last six months 2 Yes I am not always physically able to shop, cook and/or feed myself 0 No Nutrition Protocols Good Risk Protocol Moderate Risk Protocol High Risk Proctocol 0 Provide education on nutrition Risk Level: High Risk Score: 11 Electronic Signature(s) Signed: 12/27/2020 4:28:21 PM By: Dolan Amen RN Entered By: Dolan Amen on 12/27/2020 10:49:24

## 2020-12-27 NOTE — Progress Notes (Addendum)
ANNEKE, CUNDY ([id number]) Visit Report for 12/27/2020 Allergy List Details Patient Name: Christine [name], Christine [name]. Date of Service: 12/27/2020 10:15 AM Medical Record Number: [id number] Patient Account Number: [id number] Date of Birth/Sex: 1946-04-14 (75 y.o. F) Treating RN: Dolan Amen Primary Care Manasi Dishon: Harrel Lemon Other Clinician: Referring Boubacar Lerette: Referral, Self Treating Aunna Snooks/Extender: Jeri Cos Weeks in Treatment: 0 Allergies Active Allergies oxycodone Reaction: restless Allergy Notes Electronic Signature(s) Signed: 12/27/2020 4:28:21 PM By: Dolan Amen RN Entered By: Dolan Amen on 12/27/2020 10:43:25 Christine [name] ([id number]) -------------------------------------------------------------------------------- Arrival Information Details Patient Name: Christine [name], Christine [name]. Date of Service: 12/27/2020 10:15 AM Medical Record Number: [id number] Patient Account Number: [id number] Date of Birth/Sex: [birth year] (75 y.o. F) Treating RN: Dolan Amen Primary Care Lenise Jr: Harrel Lemon Other Clinician: Referring Fleet Higham: Referral, Self Treating Edwinna Rochette/Extender: Skipper Cliche in Treatment: 0 Visit Information Patient Arrived: Ambulatory Arrival Time: 10:36 Accompanied By: self Transfer Assistance: None Patient Identification Verified: Yes Secondary Verification Process Completed: Yes History Since Last Visit Electronic Signature(s) Signed: 12/27/2020 4:28:21 PM By: Dolan Amen RN Entered By: Dolan Amen on 12/27/2020 10:41:25 Christine [name] ([id number]) -------------------------------------------------------------------------------- Clinic Level of Care Assessment Details Patient Name: Christine [name]. Date of Service: 12/27/2020 10:15 AM Medical Record Number: [id number] Patient Account Number: [id number] Date of Birth/Sex: 1946-03-15 (75 y.o. F) Treating RN: Dolan Amen Primary Care Sabreena Vogan: Harrel Lemon Other Clinician: Referring Saketh Daubert:  Referral, Self Treating Jin Shockley/Extender: Skipper Cliche in Treatment: 0 Clinic Level of Care Assessment Items TOOL 2 Quantity Score X - Use when only an EandM is performed on the INITIAL visit 1 0 ASSESSMENTS - Nursing Assessment / Reassessment X - General Physical Exam (combine w/ comprehensive assessment (listed just below) when performed on new 1 20 pt. evals) X- 1 25 Comprehensive Assessment (HX, ROS, Risk Assessments, Wounds Hx, etc.) ASSESSMENTS - Wound and Skin Assessment / Reassessment X - Simple Wound Assessment / Reassessment - one wound 1 5 []  - 0 Complex Wound Assessment / Reassessment - multiple wounds []  - 0 Dermatologic / Skin Assessment (not related to wound area) ASSESSMENTS - Ostomy and/or Continence Assessment and Care []  - Incontinence Assessment and Management 0 []  - 0 Ostomy Care Assessment and Management (repouching, etc.) PROCESS - Coordination of Care X - Simple Patient / Family Education for ongoing care 1 15 []  - 0 Complex (extensive) Patient / Family Education for ongoing care []  - 0 Staff obtains Programmer, systems, Records, Test Results / Process Orders []  - 0 Staff telephones HHA, Nursing Homes / Clarify orders / etc []  - 0 Routine Transfer to another Facility (non-emergent condition) []  - 0 Routine Hospital Admission (non-emergent condition) []  - 0 New Admissions / Biomedical engineer / Ordering NPWT, Apligraf, etc. []  - 0 Emergency Hospital Admission (emergent condition) X- 1 10 Simple Discharge Coordination []  - 0 Complex (extensive) Discharge Coordination PROCESS - Special Needs []  - Pediatric / Minor Patient Management 0 []  - 0 Isolation Patient Management []  - 0 Hearing / Language / Visual special needs []  - 0 Assessment of Community assistance (transportation, D/C planning, etc.) []  - 0 Additional assistance / Altered mentation []  - 0 Support Surface(s) Assessment (bed, cushion, seat, etc.) INTERVENTIONS - Wound Cleansing /  Measurement X - Wound Imaging (photographs - any number of wounds) 1 5 []  - 0 Wound Tracing (instead of photographs) X- 1 5 Simple Wound Measurement - one wound []  - 0 Complex Wound Measurement - multiple wounds Christine [name], ROZAS. ([id number]) X- 1 5 Simple Wound Cleansing -  one wound []  - 0 Complex Wound Cleansing - multiple wounds INTERVENTIONS - Wound Dressings []  - Small Wound Dressing one or multiple wounds 0 X- 1 15 Medium Wound Dressing one or multiple wounds []  - 0 Large Wound Dressing one or multiple wounds []  - 0 Application of Medications - injection INTERVENTIONS - Miscellaneous []  - External ear exam 0 []  - 0 Specimen Collection (cultures, biopsies, blood, body fluids, etc.) []  - 0 Specimen(s) / Culture(s) sent or taken to Lab for analysis []  - 0 Patient Transfer (multiple staff / Civil Service fast streamer / Similar devices) []  - 0 Simple Staple / Suture removal (25 or less) []  - 0 Complex Staple / Suture removal (26 or more) []  - 0 Hypo / Hyperglycemic Management (close monitor of Blood Glucose) []  - 0 Ankle / Brachial Index (ABI) - do not check if billed separately Has the patient been seen at the hospital within the last three years: Yes Total Score: 105 Level Of Care: New/Established - Level 3 Electronic Signature(s) Signed: 12/27/2020 4:28:21 PM By: Dolan Amen RN Entered By: Dolan Amen on 12/27/2020 11:22:37 Christine [name] ([id number]) -------------------------------------------------------------------------------- Lower Extremity Assessment Details Patient Name: Christine [name]. Date of Service: 12/27/2020 10:15 AM Medical Record Number: [id number] Patient Account Number: [id number] Date of Birth/Sex: 04-Nov-1945 (75 y.o. F) Treating RN: Dolan Amen Primary Care Zaccary Creech: Harrel Lemon Other Clinician: Referring Damarkus Balis: Referral, Self Treating Mellisa Arshad/Extender: Jeri Cos Weeks in Treatment: 0 Electronic Signature(s) Signed: 12/27/2020 4:28:21 PM  By: Dolan Amen RN Entered By: Dolan Amen on 12/27/2020 10:57:44 Christine [name] ([id number]) -------------------------------------------------------------------------------- Multi Wound Chart Details Patient Name: Christine [name], Christine [name]. Date of Service: 12/27/2020 10:15 AM Medical Record Number: [id number] Patient Account Number: [id number] Date of Birth/Sex: [birth year] (75 y.o. F) Treating RN: Dolan Amen Primary Care Danney Bungert: Harrel Lemon Other Clinician: Referring Camylle Whicker: Referral, Self Treating Sybilla Malhotra/Extender: Skipper Cliche in Treatment: 0 Vital Signs Height(in): 62 Pulse(bpm): 62 Weight(lbs): 82 Blood Pressure(mmHg): 168/91 Body Mass Index(BMI): 15 Temperature(F): 98.3 Respiratory Rate(breaths/min): 18 Photos: [N/A:N/A] Wound Location: Left Breast N/A N/A Wounding Event: Other Lesion N/A N/A Primary Etiology: Malignant Wound N/A N/A Comorbid History: Cataracts, Congestive Heart Failure, N/A N/A Coronary Artery Disease, Hypertension, Myocardial Infarction, Peripheral Arterial Disease, Osteoarthritis, Received Chemotherapy Date Acquired: 06/16/2016 N/A N/A Weeks of Treatment: 0 N/A N/A Wound Status: Open N/A N/A Measurements L x W x D (cm) 5x3x0.2 N/A N/A Area (cm) : 11.781 N/A N/A Volume (cm) : 2.356 N/A N/A % Reduction in Area: 0.00% N/A N/A % Reduction in Volume: 0.00% N/A N/A Classification: Full Thickness Without Exposed N/A N/A Support Structures Exudate Amount: Medium N/A N/A Exudate Type: Sanguinous N/A N/A Exudate Color: red N/A N/A Granulation Amount: Large (67-100%) N/A N/A Granulation Quality: Red N/A N/A Necrotic Amount: Small (1-33%) N/A N/A Necrotic Tissue: Eschar N/A N/A Exposed Structures: Fat Layer (Subcutaneous Tissue): N/A N/A Yes Fascia: No Tendon: No Muscle: No Joint: No Bone: No Epithelialization: None N/A N/A Treatment Notes Electronic Signature(s) Signed: 12/27/2020 4:28:21 PM By: Dolan Amen RN Entered By:  Dolan Amen on 12/27/2020 11:17:05 Christine [name] ([id number]) Christine [name] ([id number]) -------------------------------------------------------------------------------- Multi-Disciplinary Care Plan Details Patient Name: Christine [name], DEMARAIS. Date of Service: 12/27/2020 10:15 AM Medical Record Number: [id number] Patient Account Number: [id number] Date of Birth/Sex: [birth year] (75 y.o. F) Treating RN: Dolan Amen Primary Care Jess Toney: Harrel Lemon Other Clinician: Referring Nita Whitmire: Referral, Self Treating Harjot Zavadil/Extender: Jeri Cos Weeks in Treatment: 0 Active Inactive Electronic Signature(s) Signed: 01/29/2021 12:47:09 PM  By: Gretta Cool, BSN, RN, CWS, Kim RN, BSN Signed: 02/14/2021 4:49:12 PM By: Dolan Amen RN Previous Signature: 12/27/2020 4:28:21 PM Version By: Dolan Amen RN Entered By: Gretta Cool BSN, RN, CWS, Kim on 01/29/2021 12:47:08 Christine [name] ([id number]) -------------------------------------------------------------------------------- Pain Assessment Details Patient Name: Christine [name], STINER. Date of Service: 12/27/2020 10:15 AM Medical Record Number: [id number] Patient Account Number: [id number] Date of Birth/Sex: Mar 15, 1946 (75 y.o. F) Treating RN: Dolan Amen Primary Care Sofhia Ulibarri: Harrel Lemon Other Clinician: Referring Aitan Rossbach: Referral, Self Treating Korrin Waterfield/Extender: Skipper Cliche in Treatment: 0 Active Problems Location of Pain Severity and Description of Pain Patient Has Paino No Site Locations Rate the pain. Current Pain Level: 0 Pain Management and Medication Current Pain Management: Electronic Signature(s) Signed: 12/27/2020 4:28:21 PM By: Dolan Amen RN Entered By: Dolan Amen on 12/27/2020 10:41:32 Christine [name] ([id number]) -------------------------------------------------------------------------------- Patient/Caregiver Education Details Patient Name: Christine [name]. Date of Service: 12/27/2020 10:15 AM Medical Record  Number: [id number] Patient Account Number: [id number] Date of Birth/Gender: January 06, 1946 (75 y.o. F) Treating RN: Dolan Amen Primary Care Physician: Harrel Lemon Other Clinician: Referring Physician: Referral, Self Treating Physician/Extender: Skipper Cliche in Treatment: 0 Education Assessment Education Provided To: Patient Education Topics Provided Smoking and Wound Healing: Methods: Explain/Verbal Responses: State content correctly Wound/Skin Impairment: Methods: Explain/Verbal Responses: State content correctly Electronic Signature(s) Signed: 12/27/2020 4:28:21 PM By: Dolan Amen RN Entered By: Dolan Amen on 12/27/2020 11:22:55 Christine [name] ([id number]) -------------------------------------------------------------------------------- Wound Assessment Details Patient Name: Christine [name]. Date of Service: 12/27/2020 10:15 AM Medical Record Number: [id number] Patient Account Number: [id number] Date of Birth/Sex: 01-10-1946 (75 y.o. F) Treating RN: Dolan Amen Primary Care Humzah Harty: Harrel Lemon Other Clinician: Referring Kortny Lirette: Referral, Self Treating Mariem Skolnick/Extender: Skipper Cliche in Treatment: 0 Wound Status Wound Number: 3 Primary Malignant Wound Etiology: Wound Location: Left Breast Wound Open Wounding Event: Other Lesion Status: Date Acquired: 06/16/2016 Comorbid Cataracts, Congestive Heart Failure, Coronary Artery Weeks Of Treatment: 0 History: Disease, Hypertension, Myocardial Infarction, Peripheral Clustered Wound: No Arterial Disease, Osteoarthritis, Received Chemotherapy Photos Wound Measurements Length: (cm) 5 Width: (cm) 3 Depth: (cm) 0.2 Area: (cm) 11.781 Volume: (cm) 2.356 % Reduction in Area: 0% % Reduction in Volume: 0% Epithelialization: None Tunneling: No Undermining: No Wound Description Classification: Full Thickness Without Exposed Support Structures Exudate Amount: Medium Exudate Type: Sanguinous Exudate  Color: red Foul Odor After Cleansing: No Slough/Fibrino No Wound Bed Granulation Amount: Large (67-100%) Exposed Structure Granulation Quality: Red Fascia Exposed: No Necrotic Amount: None Present (0%) Fat Layer (Subcutaneous Tissue) Exposed: Yes Tendon Exposed: No Muscle Exposed: No Joint Exposed: No Bone Exposed: No Electronic Signature(s) Signed: 12/27/2020 4:28:21 PM By: Dolan Amen RN Previous Signature: 12/27/2020 11:08:06 AM Version By: Dolan Amen RN Entered By: Dolan Amen on 12/27/2020 11:19:48 Christine [name] ([id number]) -------------------------------------------------------------------------------- Vitals Details Patient Name: Christine [name]. Date of Service: 12/27/2020 10:15 AM Medical Record Number: [id number] Patient Account Number: [id number] Date of Birth/Sex: [birth year] (75 y.o. F) Treating RN: Dolan Amen Primary Care Casimira Sutphin: Harrel Lemon Other Clinician: Referring Jalaine Riggenbach: Referral, Self Treating Krystle Oberman/Extender: Skipper Cliche in Treatment: 0 Vital Signs Time Taken: 10:42 Temperature (F): 98.3 Height (in): 62 Pulse (bpm): 62 Source: Stated Respiratory Rate (breaths/min): 18 Weight (lbs): 82 Blood Pressure (mmHg): 168/91 Source: Measured Reference Range: 80 - 120 mg / dl Body Mass Index (BMI): 15 Electronic Signature(s) Signed: 12/27/2020 4:28:21 PM By: Dolan Amen RN Entered By: Dolan Amen on 12/27/2020 10:43:04

## 2020-12-28 NOTE — Progress Notes (Signed)
GUERLINE, HAPP ([id number]) Visit Report for 12/27/2020 Chief Complaint Document Details Patient Name: Christine [name], Christine [name]. Date of Service: 12/27/2020 10:15 AM Medical Record Number: [id number] Patient Account Number: [id number] Date of Birth/Sex: 04/02/1946 (75 y.o. F) Treating RN: Dolan Amen Primary Care Provider: Harrel Lemon Other Clinician: Referring Provider: Referral, Self Treating Provider/Extender: Skipper Cliche in Treatment: 0 Information Obtained from: Patient Chief Complaint Left breast ulcer Electronic Signature(s) Signed: 12/27/2020 11:11:03 AM By: Worthy Keeler PA-C Entered By: Worthy Keeler on 12/27/2020 11:11:03 Christine [name] ([id number]) -------------------------------------------------------------------------------- Debridement Details Patient Name: Christine [name]. Date of Service: 12/27/2020 10:15 AM Medical Record Number: [id number] Patient Account Number: [id number] Date of Birth/Sex: [birth year] (75 y.o. F) Treating RN: Dolan Amen Primary Care Provider: Harrel Lemon Other Clinician: Referring Provider: Referral, Self Treating Provider/Extender: Skipper Cliche in Treatment: 0 Debridement Performed for Wound #3 Left Breast Assessment: Performed By: Physician Tommie Sams., PA-C Debridement Type: Chemical/Enzymatic/Mechanical Agent Used: saline gauze Level of Consciousness (Pre- Awake and Alert procedure): Pre-procedure Verification/Time Out Yes - 11:17 Taken: Start Time: 11:17 Instrument: Other : gauze Bleeding: None Response to Treatment: Procedure was tolerated well Level of Consciousness (Post- Awake and Alert procedure): Post Debridement Measurements of Total Wound Length: (cm) 5 Width: (cm) 3 Depth: (cm) 0.2 Volume: (cm) 2.356 Character of Wound/Ulcer Post Debridement: Stable Post Procedure Diagnosis Same as Pre-procedure Electronic Signature(s) Signed: 12/27/2020 4:28:21 PM By: Dolan Amen RN Signed: 12/27/2020  5:40:29 PM By: Worthy Keeler PA-C Entered By: Dolan Amen on 12/27/2020 11:17:47 Christine [name] ([id number]) -------------------------------------------------------------------------------- HPI Details Patient Name: Christine [name], Christine [name]. Date of Service: 12/27/2020 10:15 AM Medical Record Number: [id number] Patient Account Number: [id number] Date of Birth/Sex: 25-Nov-1945 (75 y.o. F) Treating RN: Dolan Amen Primary Care Provider: Harrel Lemon Other Clinician: Referring Provider: Referral, Self Treating Provider/Extender: Skipper Cliche in Treatment: 0 History of Present Illness Associated Signs and Symptoms: Patient has a history of hypertension, breast cancer, she is a current tobacco user, and she does have depression. HPI Description: 03/02/18 on evaluation today patient is seen for initial evaluation here in our clinic concerning issues that she has been having with an ulcer over the left breast area involving the nipple which is a result of Carcinoma. With that being said the patient appears to have received intermittent treatment due to discontinuing follow-up at times. This was initially back in 2013 and then subsequently on July 08, 2016 she had a pet scan suggesting the possibility of metastatic lesions in her long. They were too small to characterize however. She received one dose of chemotherapy July 30, 2016 again and then subsequently did not do follow-up for any additional follow-up in the interim. She is has a 10 according to records to undergo surgery or chemotherapy. With that being said she has had the wound on her breast foot seems to be quite a while possibly even as much as a year and a half having open January 2018. 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 She was referred to [place] by Dr. Grayland Ormond regarding the ulcer itself. Her current treatment plan was discussed to not necessarily be  curative although it was the only thing she really wanted to pursue at this point. Nonetheless she does state that she understands all of this mentioned above. Nonetheless she's currently been using triple antibiotic ointment along with a Band-Aid over the wound area. She does have discomfort in  fact she really didn't want anybody touching the area today due to health painful that was. Fortunately there's no evidence of overt infection at this point. No fevers chills noted 03/16/18 on evaluation today patient actually appears to be doing fairly well in regard to her left breast wound. She has been tolerating the dressing changes specifically the silver cell without complication. She states she's having much less pain and overall feels much better with this then she has with anything previous. The wound not staying so wet has seem to have been great benefit for her. In general the patient states that she's having no other major problems and complications at this time which is good news. No fevers, chills, nausea, or vomiting noted at this time. 04/12/18 on evaluation today patient actually appears to be doing a little better in regard to her left breast ulcer. This actually appears to be for the most part dry and a lot of the drain/eschar overlying the surface of what was all a wound initially appears to be doing much better. There is very little drainage that she has noted since I last saw her about three weeks ago. Fortunately there does not appear to be any evidence of infection at this time. She seems to be tolerating the treatment that Dr. Grayland Ormond has initiated at this point without complication. 06/01/18 on evaluation today patient actually appears to be doing much better in regard to the left chest wall ulcer which has been associated with a cancerous lesion. Nonetheless she did have some eschar on the surface of the wound region which when I gently cleanse the area pretty much came off  completely and there were only two small areas it even shows evidence of an opening at this time. Overall she seems to be doing excellent which is great news. I'm not really sure what the status of her cancer is per se she is going back to see Dr. Grayland Ormond sometime soon in order to see whether or not another PET scan will be ordered. 07/09/18 on evaluation today patient presents for follow-up concerning her chest wall ulcer which again was a cancerous lesion in nature. Fortunately this appears to have completely healed and has done excellent since I first began seeing her. There is no signs of an opening at this point. Readmission: 12/20/2019 on evaluation today patient appears to be doing really about the same as when I saw her back in 2019 as well with regard to the wound on her left breast region. Again this is a cancerous lesion and mainly she develops issues with draining but then will crust up around the wound and cause it to have some trouble as far as itching and discomfort in general is concerned. She does not really know when this began to drain more after I discharged her on 16 June 2018. Nonetheless she continued to have problems with that apparently started to drain some time afterward. Right now she states she is coming back in to have me look at it to see if there is anything we can do to help and subsequently also because the DMV checks up on her regularly to make sure she is still sufficiently able to safely drive. Therefore she is trying to catch up with all of her doctors and ensure that everything is in order as they will be checking in with her shortly. Readmission: 12/27/2020 upon evaluation today patient appears to be doing actually very similar to how this wound appeared when I saw her actually  about a year ago. In fact within a week of a year ago almost to the day. With that being said she still has a significant wound on the left breast area that is subsequently a cancerous  lesion. She has been tolerating the dressing changes in the past with the silver alginate without complication though she is not had anything currently she just been leaving it open to air. Thus what brings her back in today for reevaluation. With that being said she tells me there is no signs of infection but the drainage is really causing a lot of trouble for her. Electronic Signature(s) Signed: 12/27/2020 5:31:21 PM By: Worthy Keeler PA-C Entered By: Worthy Keeler on 12/27/2020 17:31:21 Christine [name] ([id number]) -------------------------------------------------------------------------------- Physical Exam Details Patient Name: Christine [name], Christine [name]. Date of Service: 12/27/2020 10:15 AM Medical Record Number: [id number] Patient Account Number: [id number] Date of Birth/Sex: May 02, 1946 (75 y.o. F) Treating RN: Dolan Amen Primary Care Provider: Harrel Lemon Other Clinician: Referring Provider: Referral, Self Treating Provider/Extender: Skipper Cliche in Treatment: 0 Constitutional sitting or standing blood pressure is within target range for patient.. pulse regular and within target range for patient.Marland Kitchen respirations regular, non- labored and within target range for patient.Marland Kitchen temperature within target range for patient.. Well-nourished and well-hydrated in no acute distress. Eyes conjunctiva clear no eyelid edema noted. pupils equal round and reactive to light and accommodation. Ears, Nose, Mouth, and Throat no gross abnormality of ear auricles or external auditory canals. normal hearing noted during conversation. mucus membranes moist. Respiratory normal breathing without difficulty. Psychiatric this patient is able to make decisions and demonstrates good insight into disease process. Alert and Oriented x 3. pleasant and cooperative. Notes Upon inspection patient's wound bed actually showed signs of fairly good granulation all things considered. Again this is a malignant wound and  so subsequently I do not expect it to completely heal and stay healed but nonetheless it does seem to be doing a little bit more poorly than when I was able to get this close previous which actually surprise both of [place] back at that time. Nonetheless I think we can definitely try to do some things to help her with the drainage and hopefully improve her overall experience with this wound currently. Electronic Signature(s) Signed: 12/27/2020 5:31:50 PM By: Worthy Keeler PA-C Entered By: Worthy Keeler on 12/27/2020 17:31:50 Christine [name] ([id number]) -------------------------------------------------------------------------------- Physician Orders Details Patient Name: Christine [name], Christine [name]. Date of Service: 12/27/2020 10:15 AM Medical Record Number: [id number] Patient Account Number: [id number] Date of Birth/Sex: Jan 14, 1946 (75 y.o. F) Treating RN: Dolan Amen Primary Care Provider: Harrel Lemon Other Clinician: Referring Provider: Referral, Self Treating Provider/Extender: Skipper Cliche in Treatment: 0 Verbal / Phone Orders: No Diagnosis Coding ICD-10 Coding Code Description C50.912 Malignant neoplasm of unspecified site of left female breast L98.492 Non-pressure chronic ulcer of skin of other sites with fat layer exposed Z72.0 Tobacco use I10 Essential (primary) hypertension Follow-up Appointments o Return Appointment in 2 weeks. Bathing/ Shower/ Hygiene o May shower; gently cleanse wound with antibacterial soap, rinse and pat dry prior to dressing wounds Wound Treatment Wound #3 - Breast Wound Laterality: Left Cleanser: [place] Ancillary Kit - 15 Day Supply (DME) (Generic) 1 x Per Day/30 Days Discharge Instructions: Use supplies as instructed; Kit contains: (15) Saline Bullets; (15) 3x3 Gauze; 15 pr Gloves Cleanser: Normal Saline 1 x Per Day/30 Days Discharge Instructions: Wash your hands with soap and water. Remove old dressing, discard into plastic bag and place into  trash. Cleanse the wound with Normal Saline prior to applying a clean dressing using gauze sponges, not tissues or cotton balls. Do not scrub or use excessive force. Pat dry using gauze sponges, not tissue or cotton balls. Primary Dressing: Silvercel Small 2x2 (in/in) (DME) (Generic) 1 x Per Day/30 Days Discharge Instructions: Apply Silvercel Small 2x2 (in/in) as instructed Primary Dressing: Zetuvit Plus Silicone Border Dressing 5x5 (in/in) (DME) (Generic) 1 x Per Day/30 Days Discharge Instructions: Secure silver alginate Electronic Signature(s) Signed: 12/27/2020 4:28:21 PM By: Dolan Amen RN Signed: 12/27/2020 5:40:29 PM By: Worthy Keeler PA-C Entered By: Dolan Amen on 12/27/2020 11:22:03 Christine [name] ([id number]) -------------------------------------------------------------------------------- Problem List Details Patient Name: Christine [name], Christine [name]. Date of Service: 12/27/2020 10:15 AM Medical Record Number: [id number] Patient Account Number: [id number] Date of Birth/Sex: 1945-11-16 (75 y.o. 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 Date of Service: 12/27/2020 10:15  AM Medical Record Number: [id number] Patient Account Number: [id number] Date of Birth/Sex: 1945-06-21 (75 y.o. F) Treating RN: Dolan Amen Primary Care Provider: Harrel Lemon Other Clinician: Referring Provider: Referral, Self Treating Provider/Extender: Skipper Cliche in Treatment: 0 Subjective Chief Complaint Information obtained from Patient Left breast ulcer History of Present Illness (HPI) The following HPI elements were documented for the patient's wound: Associated Signs and Symptoms: Patient has a history of hypertension, breast cancer, she is a current tobacco user, and she does have depression. 03/02/18 on evaluation today patient is seen for initial evaluation here in our clinic concerning issues that she has been having with an ulcer over the left breast area involving the nipple which is a result of Carcinoma. With that being said the patient appears to have received intermittent treatment due to discontinuing follow-up at times. This was initially back in 2013 and then subsequently on July 08, 2016 she had a pet scan suggesting the possibility of metastatic lesions in her long. They were too small to characterize however. She received one dose of chemotherapy July 30, 2016 again and then subsequently did not do follow-up for any additional follow-up in the interim. She is has a 10 according to records to undergo surgery or chemotherapy. With that being said she has had the wound on her breast foot seems to be quite a while possibly even as much as a year and a half having open January 2018. 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 She was referred to [place] by Dr. Grayland Ormond regarding the ulcer itself. Her current treatment plan was discussed to not necessarily be curative although it was the only thing she really wanted to pursue at this point. Nonetheless she does state that she understands all  of this mentioned above. Nonetheless she's currently been using triple antibiotic ointment along with a Band-Aid over the wound area. She does have discomfort in fact she really  didn't want anybody touching the area today due to health painful that was. Fortunately there's no evidence of overt infection at this point. No fevers chills noted 03/16/18 on evaluation today patient actually appears to be doing fairly well in regard to her left breast wound. She has been tolerating the dressing changes specifically the silver cell without complication. She states she's having much less pain and overall feels much better with this then she has with anything previous. The wound not staying so wet has seem to have been great benefit for her. In general the patient states that she's having no other major problems and complications at this time which is good news. No fevers, chills, nausea, or vomiting noted at this time. 04/12/18 on evaluation today patient actually appears to be doing a little better in regard to her left breast ulcer. This actually appears to be for the most part dry and a lot of the drain/eschar overlying the surface of what was all a wound initially appears to be doing much better. There is very little drainage that she has noted since I last saw her about three weeks ago. Fortunately there does not appear to be any evidence of infection at this time. She seems to be tolerating the treatment that Dr. Grayland Ormond has initiated at this point without complication. 06/01/18 on evaluation today patient actually appears to be doing much better in regard to the left chest wall ulcer which has been associated with a cancerous lesion. Nonetheless she did have some eschar on the surface of the wound region which when I gently cleanse the area pretty much came off completely and there were only two small areas it even shows evidence of an opening at this time. Overall she seems to be doing excellent which  is great news. I'm not really sure what the status of her cancer is per se she is going back to see Dr. Grayland Ormond sometime soon in order to see whether or not another PET scan will be ordered. 07/09/18 on evaluation today patient presents for follow-up concerning her chest wall ulcer which again was a cancerous lesion in nature. Fortunately this appears to have completely healed and has done excellent since I first began seeing her. There is no signs of an opening at this point. Readmission: 12/20/2019 on evaluation today patient appears to be doing really about the same as when I saw her back in 2019 as well with regard to the wound on her left breast region. Again this is a cancerous lesion and mainly she develops issues with draining but then will crust up around the wound and cause it to have some trouble as far as itching and discomfort in general is concerned. She does not really know when this began to drain more after I discharged her on 16 June 2018. Nonetheless she continued to have problems with that apparently started to drain some time afterward. Right now she states she is coming back in to have me look at it to see if there is anything we can do to help and subsequently also because the DMV checks up on her regularly to make sure she is still sufficiently able to safely drive. Therefore she is trying to catch up with all of her doctors and ensure that everything is in order as they will be checking in with her shortly. Readmission: 12/27/2020 upon evaluation today patient appears to be doing actually very similar to how this wound appeared when I saw her actually about a year  ago. In fact within a week of a year ago almost to the day. With that being said she still has a significant wound on the left breast area that is subsequently a cancerous lesion. She has been tolerating the dressing changes in the past with the silver alginate without complication though she is not had anything  currently she just been leaving it open to air. Thus what brings her back in today for reevaluation. With that being said she tells me there is no signs of infection but the drainage is really causing a lot of trouble for her. Patient History Information obtained from Patient. Allergies EULANDA, DORION ([id number]) oxycodone (Reaction: restless) Family History Heart Disease - Father,Paternal Grandparents, Hypertension - Father,Child, Stroke - Mother, No family history of Cancer, Diabetes, Kidney Disease, Lung Disease, Seizures, Thyroid Problems, Tuberculosis. Social History Current every day smoker, Marital Status - Divorced, Alcohol Use - Never, Drug Use - No History, Caffeine Use - Daily. Medical History Eyes Patient has history of Cataracts Denies history of Glaucoma, Optic Neuritis Ear/Nose/Mouth/Throat Denies history of Chronic sinus problems/congestion, Middle ear problems Hematologic/Lymphatic Denies history of Anemia, Hemophilia, Human Immunodeficiency Virus, Lymphedema, Sickle Cell Disease Respiratory Denies history of Aspiration, Asthma, Chronic Obstructive Pulmonary Disease (COPD), Pneumothorax, Sleep Apnea, Tuberculosis Cardiovascular Patient has history of Congestive Heart Failure, Coronary Artery Disease, Hypertension, Myocardial Infarction, Peripheral Arterial Disease Denies history of Angina, Arrhythmia, Deep Vein Thrombosis, Hypotension, Peripheral Venous Disease, Phlebitis, Vasculitis Gastrointestinal Denies history of Cirrhosis , Colitis, Crohn s, Hepatitis A, Hepatitis B, Hepatitis C Endocrine Denies history of Type I Diabetes, Type II Diabetes Genitourinary Denies history of End Stage Renal Disease Immunological Denies history of Lupus Erythematosus, Raynaud s, Scleroderma Integumentary (Skin) Denies history of History of Burn, History of pressure wounds Musculoskeletal Patient has history of Osteoarthritis Denies history of Gout, Rheumatoid Arthritis,  Osteomyelitis Neurologic Denies history of Dementia, Neuropathy, Quadriplegia, Paraplegia, Seizure Disorder Oncologic Patient has history of Received Chemotherapy Denies history of Received Radiation Psychiatric Denies history of Anorexia/bulimia, Confinement Anxiety Medical And Surgical History Notes Constitutional Symptoms (General Health) Defibulator; Cancer Stage III; HTN; High Cholesterol, Oncologic Breast Cancer-Only 2 treatment of Chemo one in 2012; one in 2018 Stage 4 Breast cancer-2022 Review of Systems (ROS) Eyes Complains or has symptoms of Vision Changes. Denies complaints or symptoms of Dry Eyes, Glasses / Contacts. Hematologic/Lymphatic Denies complaints or symptoms of Bleeding / Clotting Disorders, Human Immunodeficiency Virus. Respiratory Denies complaints or symptoms of Chronic or frequent coughs, Shortness of Breath. Cardiovascular Denies complaints or symptoms of Chest pain, LE edema. Gastrointestinal Denies complaints or symptoms of Frequent diarrhea, Nausea, Vomiting. Endocrine Denies complaints or symptoms of Hepatitis, Thyroid disease, Polydypsia (Excessive Thirst). Genitourinary Denies complaints or symptoms of Kidney failure/ Dialysis, Incontinence/dribbling. Immunological Denies complaints or symptoms of Hives, Itching. Integumentary (Skin) Complains or has symptoms of Breakdown. Denies complaints or symptoms of Wounds, Bleeding or bruising tendency, Swelling. Musculoskeletal Denies complaints or symptoms of Muscle Pain, Muscle Weakness. Neurologic Denies complaints or symptoms of Numbness/parasthesias, Focal/Weakness. Psychiatric Denies complaints or symptoms of Anxiety, Claustrophobia. General Notes: Patient has implanted defibrillator left chest Christine [name], Christine [name]. ([id number]) Objective Constitutional sitting or standing blood pressure is within target range for patient.. pulse regular and within target range for patient.Marland Kitchen respirations regular,  non- labored and within target range for patient.Marland Kitchen temperature within target range for patient.. Well-nourished and well-hydrated in no acute distress. Vitals Time Taken: 10:42 AM, Height: 62 in, Source: Stated, Weight: 82 lbs, Source: Measured, BMI: 15, Temperature: 98.3 F, Pulse: 62  bpm, Respiratory Rate: 18 breaths/min, Blood Pressure: 168/91 mmHg. Eyes conjunctiva clear no eyelid edema noted. pupils equal round and reactive to light and accommodation. Ears, Nose, Mouth, and Throat no gross abnormality of ear auricles or external auditory canals. normal hearing noted during conversation. mucus membranes moist. Respiratory normal breathing without difficulty. Psychiatric this patient is able to make decisions and demonstrates good insight into disease process. Alert and Oriented x 3. pleasant and cooperative. General Notes: Upon inspection patient's wound bed actually showed signs of fairly good granulation all things considered. Again this is a malignant wound and so subsequently I do not expect it to completely heal and stay healed but nonetheless it does seem to be doing a little bit more poorly than when I was able to get this close previous which actually surprise both of [place] back at that time. Nonetheless I think we can definitely try to do some things to help her with the drainage and hopefully improve her overall experience with this wound currently. Integumentary (Hair, Skin) Wound #3 status is Open. Original cause of wound was Other Lesion. The date acquired was: 06/16/2016. The wound is located on the Left Breast. The wound measures 5cm length x 3cm width x 0.2cm depth; 11.781cm^2 area and 2.356cm^3 volume. There is Fat Layer (Subcutaneous Tissue) exposed. There is no tunneling or undermining noted. There is a medium amount of sanguinous drainage noted. There is large (67-100%) red granulation within the wound bed. There is no necrotic tissue within the wound bed. Assessment Active  Problems ICD-10 Malignant neoplasm of unspecified site of left female breast Non-pressure chronic ulcer of skin of other sites with fat layer exposed Tobacco use Essential (primary) hypertension Procedures Wound #3 Pre-procedure diagnosis of Wound #3 is a Malignant Wound located on the Left Breast . There was a Chemical/Enzymatic/Mechanical debridement performed by Tommie Sams., PA-C. With the following instrument(s): gauze. Other agent used was saline gauze. A time out was conducted at 11:17, prior to the start of the procedure. There was no bleeding. The procedure was tolerated well. Post Debridement Measurements: 5cm length x 3cm width x 0.2cm depth; 2.356cm^3 volume. Character of Wound/Ulcer Post Debridement is stable. Post procedure Diagnosis Wound #3: Same as Pre-Procedure Christine [name], Christine [name] ([id number]) Plan Follow-up Appointments: Return Appointment in 2 weeks. Bathing/ Shower/ Hygiene: May shower; gently cleanse wound with antibacterial soap, rinse and pat dry prior to dressing wounds WOUND #3: - Breast Wound Laterality: Left Cleanser: [place] Ancillary Kit - 15 Day Supply (DME) (Generic) 1 x Per Day/30 Days Discharge Instructions: Use supplies as instructed; Kit contains: (15) Saline Bullets; (15) 3x3 Gauze; 15 pr Gloves Cleanser: Normal Saline 1 x Per Day/30 Days Discharge Instructions: Wash your hands with soap and water. Remove old dressing, discard into plastic bag and place into trash. Cleanse the wound with Normal Saline prior to applying a clean dressing using gauze sponges, not tissues or cotton balls. Do not scrub or use excessive force. Pat dry using gauze sponges, not tissue or cotton balls. Primary Dressing: Silvercel Small 2x2 (in/in) (DME) (Generic) 1 x Per Day/30 Days Discharge Instructions: Apply Silvercel Small 2x2 (in/in) as instructed Primary Dressing: Zetuvit Plus Silicone Border Dressing 5x5 (in/in) (DME) (Generic) 1 x Per Day/30 Days Discharge Instructions:  Secure silver alginate 1. Would recommend currently that we go ahead and initiate treatment with a silver alginate dressing has done well for her in the past and the patient is in agreement with that plan. 2. I am also can recommend that  we have the patient use a Zetuvit border dressing in order to cover this and subsequently I think that she will be able to change this daily which will actually be beneficial for her from the standpoint of the amount of drainage she typically has. 3. I am also can recommend the patient continue to monitor for any signs of worsening or infection I will see her in 2 weeks and then after that if she is doing well we can go back to the monthly cycle that we had previous by 1 to make sure everything is going well. We will see patient back for reevaluation in 1 week here in the clinic. If anything worsens or changes patient will contact our office for additional recommendations. Electronic Signature(s) Signed: 12/27/2020 5:32:33 PM By: Worthy Keeler PA-C Entered By: Worthy Keeler on 12/27/2020 17:32:33 Christine [name] ([id number]) -------------------------------------------------------------------------------- ROS/PFSH Details Patient Name: Christine [name], Christine [name]. Date of Service: 12/27/2020 10:15 AM Medical Record Number: [id number] Patient Account Number: [id number] Date of Birth/Sex: Apr 28, 1946 (75 y.o. F) Treating RN: Dolan Amen Primary Care Provider: Harrel Lemon Other Clinician: Referring Provider: Referral, Self Treating Provider/Extender: Skipper Cliche in Treatment: 0 Information Obtained From Patient Eyes Complaints and Symptoms: Positive for: Vision Changes Negative for: Dry Eyes; Glasses / Contacts Medical History: Positive for: Cataracts Negative for: Glaucoma; Optic Neuritis Hematologic/Lymphatic Complaints and Symptoms: Negative for: Bleeding / Clotting Disorders; Human Immunodeficiency Virus Medical History: Negative for: Anemia;  Hemophilia; Human Immunodeficiency Virus; Lymphedema; Sickle Cell Disease Respiratory Complaints and Symptoms: Negative for: Chronic or frequent coughs; Shortness of Breath Medical History: Negative for: Aspiration; Asthma; Chronic Obstructive Pulmonary Disease (COPD); Pneumothorax; Sleep Apnea; Tuberculosis Cardiovascular Complaints and Symptoms: Negative for: Chest pain; LE edema Medical History: Positive for: Congestive Heart Failure; Coronary Artery Disease; Hypertension; Myocardial Infarction; Peripheral Arterial Disease Negative for: Angina; Arrhythmia; Deep Vein Thrombosis; Hypotension; Peripheral Venous Disease; Phlebitis; Vasculitis Gastrointestinal Complaints and Symptoms: Negative for: Frequent diarrhea; Nausea; Vomiting Medical History: Negative for: Cirrhosis ; Colitis; Crohnos; Hepatitis A; Hepatitis B; Hepatitis C Endocrine Complaints and Symptoms: Negative for: Hepatitis; Thyroid disease; Polydypsia (Excessive Thirst) Medical History: Negative for: Type I Diabetes; Type II Diabetes Genitourinary Complaints and Symptoms: Negative for: Kidney failure/ Dialysis; Incontinence/dribbling Medical History: Negative for: End Stage Renal Disease Christine [name], Christine [name] ([id number]) Immunological Complaints and Symptoms: Negative for: Hives; Itching Medical History: Negative for: Lupus Erythematosus; Raynaudos; Scleroderma Integumentary (Skin) Complaints and Symptoms: Positive for: Breakdown Negative for: Wounds; Bleeding or bruising tendency; Swelling Medical History: Negative for: History of Burn; History of pressure wounds Musculoskeletal Complaints and Symptoms: Negative for: Muscle Pain; Muscle Weakness Medical History: Positive for: Osteoarthritis Negative for: Gout; Rheumatoid Arthritis; Osteomyelitis Neurologic Complaints and Symptoms: Negative for: Numbness/parasthesias; Focal/Weakness Medical History: Negative for: Dementia; Neuropathy; Quadriplegia; Paraplegia;  Seizure Disorder Psychiatric Complaints and Symptoms: Negative for: Anxiety; Claustrophobia Medical History: Negative for: Anorexia/bulimia; Confinement Anxiety Constitutional Symptoms (General Health) Medical History: Past Medical History Notes: Defibulator; Cancer Stage III; HTN; High Cholesterol, Ear/Nose/Mouth/Throat Medical History: Negative for: Chronic sinus problems/congestion; Middle ear problems Oncologic Medical History: Positive for: Received Chemotherapy Negative for: Received Radiation Past Medical History Notes: Breast Cancer-Only 2 treatment of Chemo one in 2012; one in 2018 Stage 4 Breast cancer-2022 HBO Extended History Items Eyes: Cataracts Immunizations Pneumococcal Vaccine: Received Pneumococcal Vaccination: Christine [name], Christine [name] ([id number]) Implantable Devices Yes Family and Social History Cancer: No; Diabetes: No; Heart Disease: Yes - Father,Paternal Grandparents; Hypertension: Yes - Father,Child; Kidney Disease: No; Lung Disease: No; Seizures: No; Stroke: Yes - Mother;  Thyroid Problems: No; Tuberculosis: No; Current every day smoker; Marital Status - Divorced; Alcohol Use: Never; Drug Use: No History; Caffeine Use: Daily; Financial Concerns: No; Food, Clothing or Shelter Needs: No; Support System Lacking: No; Transportation Concerns: No Notes Patient has implanted defibrillator left chest Electronic Signature(s) Signed: 12/27/2020 4:28:21 PM By: Dolan Amen RN Signed: 12/27/2020 5:40:29 PM By: Worthy Keeler PA-C Entered By: Dolan Amen on 12/27/2020 10:59:45 Christine [name] ([id number]) -------------------------------------------------------------------------------- [place] Details Patient Name: Christine [name]. Date of Service: 12/27/2020 Medical Record Number: [id number] Patient Account Number: [id number] Date of Birth/Sex: 08/05/1945 (75 y.o. F) Treating RN: Dolan Amen Primary Care Provider: Harrel Lemon Other Clinician: Referring  Provider: Referral, Self Treating Provider/Extender: Skipper Cliche in Treatment: 0 Diagnosis Coding ICD-10 Codes Code Description C50.912 Malignant neoplasm of unspecified site of left female breast L98.492 Non-pressure chronic ulcer of skin of other sites with fat layer exposed Z72.0 Tobacco use I10 Essential (primary) hypertension Facility Procedures CPT4 Code: 62836629 Description: 99213 - WOUND CARE VISIT-LEV 3 EST PT Modifier: Quantity: 1 Physician Procedures CPT4 Code: 4765465 Description: 03546 - WC PHYS LEVEL 4 - EST PT Modifier: Quantity: 1 CPT4 Code: Description: ICD-10 Diagnosis Description C50.912 Malignant neoplasm of unspecified site of left female breast L98.492 Non-pressure chronic ulcer of skin of other sites with fat layer expos Z72.0 Tobacco use I10 Essential (primary) hypertension Modifier: ed Quantity: Electronic Signature(s) Signed: 12/27/2020 5:32:50 PM By: Worthy Keeler PA-C Previous Signature: 12/27/2020 4:28:21 PM Version By: Dolan Amen RN Entered By: Worthy Keeler on 12/27/2020 17:32:49

## 2021-01-09 ENCOUNTER — Ambulatory Visit: Admission: RE | Admit: 2021-01-09 | Payer: Medicare Other | Source: Home / Self Care | Admitting: Ophthalmology

## 2021-01-09 ENCOUNTER — Encounter: Admission: RE | Payer: Self-pay | Source: Home / Self Care

## 2021-01-09 SURGERY — PHACOEMULSIFICATION, CATARACT, WITH IOL INSERTION
Anesthesia: Topical | Laterality: Left

## 2021-01-10 ENCOUNTER — Ambulatory Visit: Payer: Medicare Other | Admitting: Physician Assistant

## 2021-01-14 ENCOUNTER — Ambulatory Visit: Payer: Medicare Other | Admitting: Physician Assistant

## 2021-02-09 ENCOUNTER — Emergency Department
Admission: EM | Admit: 2021-02-09 | Discharge: 2021-02-09 | Disposition: A | Discharge status: Home / Self Care | Payer: Medicare Other | Attending: Emergency Medicine | Admitting: Emergency Medicine

## 2021-02-09 ENCOUNTER — Emergency Department: Payer: Medicare Other

## 2021-02-09 DIAGNOSIS — R630 Anorexia: Secondary | ICD-10-CM | POA: Diagnosis not present

## 2021-02-09 DIAGNOSIS — Z7902 Long term (current) use of antithrombotics/antiplatelets: Secondary | ICD-10-CM | POA: Insufficient documentation

## 2021-02-09 DIAGNOSIS — Z7982 Long term (current) use of aspirin: Secondary | ICD-10-CM | POA: Insufficient documentation

## 2021-02-09 DIAGNOSIS — N390 Urinary tract infection, site not specified: Secondary | ICD-10-CM | POA: Diagnosis not present

## 2021-02-09 DIAGNOSIS — I5022 Chronic systolic (congestive) heart failure: Secondary | ICD-10-CM | POA: Insufficient documentation

## 2021-02-09 DIAGNOSIS — Z950 Presence of cardiac pacemaker: Secondary | ICD-10-CM | POA: Insufficient documentation

## 2021-02-09 DIAGNOSIS — I2510 Atherosclerotic heart disease of native coronary artery without angina pectoris: Secondary | ICD-10-CM | POA: Insufficient documentation

## 2021-02-09 DIAGNOSIS — Z79899 Other long term (current) drug therapy: Secondary | ICD-10-CM | POA: Diagnosis not present

## 2021-02-09 DIAGNOSIS — Z955 Presence of coronary angioplasty implant and graft: Secondary | ICD-10-CM | POA: Diagnosis not present

## 2021-02-09 DIAGNOSIS — R109 Unspecified abdominal pain: Secondary | ICD-10-CM | POA: Diagnosis present

## 2021-02-09 DIAGNOSIS — Z853 Personal history of malignant neoplasm of breast: Secondary | ICD-10-CM | POA: Insufficient documentation

## 2021-02-09 DIAGNOSIS — I110 Hypertensive heart disease with heart failure: Secondary | ICD-10-CM | POA: Diagnosis not present

## 2021-02-09 DIAGNOSIS — F17210 Nicotine dependence, cigarettes, uncomplicated: Secondary | ICD-10-CM | POA: Insufficient documentation

## 2021-02-09 LAB — CBC
HCT: 36.3 % (ref 36.0–46.0)
Hemoglobin: 11.9 g/dL — ABNORMAL LOW (ref 12.0–15.0)
MCH: 30.1 pg (ref 26.0–34.0)
MCHC: 32.8 g/dL (ref 30.0–36.0)
MCV: 91.7 fL (ref 80.0–100.0)
Platelets: 97 10*3/uL — ABNORMAL LOW (ref 150–400)
RBC: 3.96 MIL/uL (ref 3.87–5.11)
RDW: 14.4 % (ref 11.5–15.5)
WBC: 5.2 10*3/uL (ref 4.0–10.5)
nRBC: 0 % (ref 0.0–0.2)

## 2021-02-09 LAB — COMPREHENSIVE METABOLIC PANEL
ALT: 8 U/L (ref 0–44)
AST: 16 U/L (ref 15–41)
Albumin: 3.6 g/dL (ref 3.5–5.0)
Alkaline Phosphatase: 42 U/L (ref 38–126)
Anion gap: 9 (ref 5–15)
BUN: 11 mg/dL (ref 8–23)
CO2: 27 mmol/L (ref 22–32)
Calcium: 8.4 mg/dL — ABNORMAL LOW (ref 8.9–10.3)
Chloride: 102 mmol/L (ref 98–111)
Creatinine, Ser: 1.13 mg/dL — ABNORMAL HIGH (ref 0.44–1.00)
GFR, Estimated: 51 mL/min — ABNORMAL LOW (ref >60)
Glucose, Bld: 92 mg/dL (ref 70–99)
Potassium: 3.9 mmol/L (ref 3.5–5.1)
Sodium: 138 mmol/L (ref 135–145)
Total Bilirubin: 0.9 mg/dL (ref 0.3–1.2)
Total Protein: 6.2 g/dL — ABNORMAL LOW (ref 6.5–8.1)

## 2021-02-09 LAB — URINALYSIS, COMPLETE (UACMP) WITH MICROSCOPIC
Bilirubin Urine: NEGATIVE
Glucose, UA: NEGATIVE mg/dL
Ketones, ur: NEGATIVE mg/dL
Nitrite: NEGATIVE
Protein, ur: NEGATIVE mg/dL
Specific Gravity, Urine: 1.009 (ref 1.005–1.030)
pH: 6 (ref 5.0–8.0)

## 2021-02-09 LAB — LIPASE, BLOOD: Lipase: 29 U/L (ref 11–51)

## 2021-02-09 MED ORDER — SODIUM CHLORIDE 0.9 % IV SOLN
1.0000 g | Freq: Once | INTRAVENOUS | Status: AC
  Administered 2021-02-09: 1 g via INTRAVENOUS
  Filled 2021-02-09: qty 10

## 2021-02-09 MED ORDER — IOHEXOL 350 MG/ML SOLN
60.0000 mL | Freq: Once | INTRAVENOUS | Status: AC | PRN
  Administered 2021-02-09: 60 mL via INTRAVENOUS

## 2021-02-09 MED ORDER — CEPHALEXIN 500 MG PO CAPS: 500.0000 mg | ORAL_CAPSULE | Freq: Four times a day (QID) | ORAL | 0 refills | Status: AC

## 2021-02-09 NOTE — Discharge Instructions (Addendum)
Please seek medical attention for any high fevers, chest pain, shortness of breath, change in behavior, persistent vomiting, bloody stool or any other new or concerning symptoms.  

## 2021-02-09 NOTE — ED Triage Notes (Addendum)
Patient presents to ER from home. Patient reports diffuse abdominal pain and right sided lower back pain for 5 days. Patient reports pain is intermittent and worsens with deep inspiration. Hx of breast cancer (has been on oral chemo for several years) and mass on kidney. Patient ambulatory. A&OX3.

## 2021-02-09 NOTE — ED Provider Notes (Signed)
[facility] Emergency Department Provider Note  ____________________________________________   I have reviewed the triage vital signs and the nursing notes.   HISTORY  Chief Complaint Abdominal Pain   History limited by: Not Limited   HPI Christine [name] is a 75 y.o. female who presents to the emergency department today because of concern for abdominal pain. The patient states that the pain started a couple days ago.  The pain was primarily located in her left flank.  She states that the pain has since migrated more towards the center of her stomach and around her abdomen.  She states she has had decreased appetite over the past couple of weeks.  No vomiting.  She has not noticed any change in or defecation.  She denies any painful or bad odor to her urine.  HLD, HTN  Records reviewed. Per medical record review patient has a history of HLD, HTN.   Past Medical History:  Diagnosis Date   Allergic rhinitis    Anxiety    Breast cancer ([place])    11/26/20 Pt reports wound/lesion on left breast   Chronic systolic heart failure (HCC)    Coronary artery disease    GERD (gastroesophageal reflux disease)    Heart murmur    Hyperlipidemia    Hypertension    Implantable defibrillator St Jude    DOI 2006   MI ([facility]ocardial infarction) [facility])    Osteoarthritis    Wears dentures    full upper and lower    Patient Active Problem List   Diagnosis Date Noted   Renal artery stenosis ([place]) 03/13/2019   AAA (abdominal aortic aneurysm) without rupture ([place]) 03/13/2019   Atherosclerosis of native arteries of extremity with rest pain ([place]) 02/13/2019   Leg swelling 02/13/2019   Postmenopausal 07/01/2018   Skin ulcer with fat layer exposed ([place]) 01/16/2018   Weakness generalized 04/10/2017   Major depressive disorder, recurrent, moderate ([place]) 04/09/2017   Pure hypercholesterolemia 04/09/2017   Pain in both lower extremities 03/31/2017   Breast cancer of upper-inner  quadrant of right female breast ([place]) 07/01/2016   Moderate single current episode of major depressive disorder ([place]) 07/16/2015   Headache 10/31/2014   Chronic obstructive pulmonary disease ([place]) 04/05/2014   Current tobacco use 04/05/2014   Anxiety 11/25/2013   Breast cancer ([place]) 11/25/2013   Depression 11/25/2013   Thrombus 03/17/2011   CARDIOMYOPATHY, ISCHEMIC 11/09/2008   Implantable cardioverter-defibrillator (ICD) in situ 11/09/2008   ANEMIA 07/30/2007   ABNORMAL THYROID FUNCTION TESTS 07/30/2007   Hyperlipidemia 11/16/2006   Essential hypertension 11/16/2006   Coronary atherosclerosis 11/16/2006   FAILURE, SYSTOLIC HEART, CHRONIC 99991111   ALLERGIC RHINITIS 11/16/2006   GERD 11/16/2006   Primary osteoarthritis involving multiple joints 11/16/2006   BACK PAIN 11/16/2006    Past Surgical History:  Procedure Laterality Date   CHF exacerbation  10/07   CORONARY STENT PLACEMENT  8/06   3 stents; myocardial infarction   LOWER EXTREMITY ANGIOGRAPHY Left 02/15/2019   Procedure: LOWER EXTREMITY ANGIOGRAPHY;  Surgeon: Katha Cabal, MD;  Location: [place] CV LAB;  Service: Cardiovascular;  Laterality: Left;   NM MYOVIEW LTD  1/08   EF 39%, no sx ischemia   PACEMAKER PLACEMENT  2/07   Defibrillator   RENAL ARTERY STENT Left     Prior to Admission medications   Medication Sig Start Date End Date Taking? Authorizing Provider  albuterol (VENTOLIN HFA) 108 (90 Base) MCG/ACT inhaler Inhale 2 puffs into the lungs 4 (four) times  daily as needed (wheezing/shortness of breath). 03/31/17   [provider]  aspirin EC 81 MG tablet Take 81 mg by mouth daily.    [provider]  bisacodyl (DULCOLAX) 5 MG EC tablet Take 5 mg by mouth daily as needed for mild constipation or moderate constipation.    [provider]  carvedilol (COREG) 25 MG tablet TAKE 1 TABLET BY MOUTH TWICE A DAY Patient taking differently: Take 25 mg by mouth 2 (two) times daily.  09/09/10   Venia Carbon, MD  clopidogrel (PLAVIX) 75 MG tablet TAKE 1 TABLET BY MOUTH ONCE DAILY 12/05/19   Schnier, Dolores Lory, MD  furosemide (LASIX) 40 MG tablet Take 40 mg by mouth daily as needed (fluid retention/swelling).    [provider]  LORazepam (ATIVAN) 0.5 MG tablet Take 0.25 mg by mouth 2 (two) times daily.  03/13/17   [provider]  pantoprazole (PROTONIX) 40 MG tablet Take 40 mg by mouth daily.  02/25/17   [provider]  Polyethylene Glycol 3350 (PEG 3350) POWD Take 17 g by mouth daily as needed (constipation.).     [provider]  rosuvastatin (CRESTOR) 10 MG tablet Take 10 mg by mouth daily.     [provider]  tamoxifen (NOLVADEX) 20 MG tablet Take 1 tablet (20 mg total) by mouth daily. Patient taking differently: Take 20 mg by mouth daily at 12 noon. 12/29/18   Lloyd Huger, MD  tetrahydrozoline 0.05 % ophthalmic solution Place 1 drop into both eyes 3 (three) times daily as needed (dry/irritated eyes).    [provider]  traMADol (ULTRAM) 50 MG tablet Take 25 mg by mouth 2 (two) times daily.    [provider]  valsartan (DIOVAN) 320 MG tablet Take 320 mg by mouth daily at 12 noon.     [provider]  venlafaxine XR (EFFEXOR-XR) 37.5 MG 24 hr capsule Take 37.5 mg by mouth daily. 01/23/20   [provider]    Allergies Atorvastatin, Lisinopril, Oxycodone hcl, and Simvastatin  Family History  Problem Relation Age of Onset   Stroke Mother 63   Heart attack Father    Coronary artery disease Father    Heart attack Brother    Coronary artery disease Other    Hypertension Other    Cancer Neg Hx        No breast or colon cancer    Social History Social History   Tobacco Use   Smoking status: Every Day    Packs/day: 0.50    Years: 12.00    Pack years: 6.00    Types: Cigarettes   Smokeless tobacco: Never   Tobacco comments:       Vaping Use   Vaping Use: Never used   Substance Use Topics   Alcohol use: No    Comment: 2-3 times per month   Drug use: No    Review of Systems Constitutional: No fever/chills Eyes: No visual changes. ENT: No sore throat. Cardiovascular: Denies chest pain. Respiratory: Denies shortness of breath. Gastrointestinal: Positive for abdominal pain, nausea, decreased appetite.  Genitourinary: Negative for dysuria. Musculoskeletal: Negative for back pain. Skin: Negative for rash. Neurological: Negative for headaches, focal weakness or numbness.  ____________________________________________   PHYSICAL EXAM:  VITAL SIGNS: ED Triage Vitals  Enc Vitals Group     BP 02/09/21 0845 137/70     Pulse Rate 02/09/21 0845 70     Resp 02/09/21 0845 18     Temp 02/09/21 0845  97.7 F (36.5 C)     Temp src --      SpO2 02/09/21 0845 98 %     Weight 02/09/21 0844 85 lb 15.7 oz (39 kg)     Height 02/09/21 0844 '5\' 2"'$  (1.575 m)     Head Circumference --      Peak Flow --      Pain Score 02/09/21 0843 5   Constitutional: Alert and oriented.  Eyes: Conjunctivae are normal.  ENT      Head: Normocephalic and atraumatic.      Nose: No congestion/rhinnorhea.      Mouth/Throat: Mucous membranes are moist.      Neck: No stridor. Hematological/Lymphatic/Immunilogical: No cervical lymphadenopathy. Cardiovascular: Normal rate, regular rhythm.  No murmurs, rubs, or gallops.  Respiratory: Normal respiratory effort without tachypnea nor retractions. Breath sounds are clear and equal bilaterally. No wheezes/rales/rhonchi. Gastrointestinal: Soft and tender to palpation in the epigastric region. No CVA tenderness.  Genitourinary: Deferred Musculoskeletal: Normal range of motion in all extremities. No lower extremity edema. Neurologic:  Normal speech and language. No gross focal neurologic deficits are appreciated.  Skin:  Skin is warm, dry and intact. No rash noted. Psychiatric: Mood and affect are normal. Speech and behavior are normal.  Patient exhibits appropriate insight and judgment.  ____________________________________________    LABS (pertinent positives/negatives)  CBC wbc 5.2, hgb 11.9, plt 97 CMP na 138, k 3.9, cl 102, cr 1.13 Lipase 29 UA cloudy, moderate hgb dipstick, trace leukocytes, 6-10 RBC, 11-20 WBC, many bacteria, 21-50 squamous cells  ____________________________________________   EKG  None  ____________________________________________    RADIOLOGY  CT abd/pel No acute abnormality  {**I, Nance Pear, personally viewed and evaluated these images (plain radiographs) as part of my medical decision making.**} ____________________________________________   PROCEDURES  Procedures  ____________________________________________   INITIAL IMPRESSION / ASSESSMENT AND PLAN / ED COURSE  Pertinent labs & imaging results that were available during my care of the patient were reviewed by me and considered in my medical decision making (see chart for details).   Patient presented to the emergency department today because of concerns for abdominal pain.  On exam patient did have some mild tenderness more in the epigastric region.  Did obtain a CT scan to evaluate for significant intra-abdominal pathology.  Does not show any acute abnormality.  Did show gallstones although have low suspicion for gallbladder etiology at this time given that patient's primary complaint for pain on the left side.  The patient urine is concerning for possible infection.  Discussed this with the patient.  Will plan on giving dose of antibiotics here in the emergency department and discharging with further antibiotics.  ____________________________________________   FINAL CLINICAL IMPRESSION(S) / ED DIAGNOSES  Final diagnoses:  Lower urinary tract infectious disease     Note: This dictation was prepared with Dragon dictation. Any transcriptional errors that result from this process are unintentional      Nance Pear, MD 02/09/21 1317

## 2021-09-28 ENCOUNTER — Observation Stay: Payer: Medicare Other

## 2021-09-28 ENCOUNTER — Inpatient Hospital Stay
Admission: EM | Admit: 2021-09-28 | Discharge: 2021-10-03 | DRG: 303 | Disposition: A | Discharge status: Home / Self Care | Payer: Medicare Other | Attending: Internal Medicine | Admitting: Internal Medicine

## 2021-09-28 ENCOUNTER — Emergency Department: Payer: Medicare Other

## 2021-09-28 ENCOUNTER — Observation Stay
Admit: 2021-09-28 | Discharge: 2021-09-28 | Disposition: A | Discharge status: Home / Self Care | Payer: Medicare Other | Attending: Internal Medicine | Admitting: Internal Medicine

## 2021-09-28 ENCOUNTER — Other Ambulatory Visit: Payer: Self-pay

## 2021-09-28 DIAGNOSIS — Z955 Presence of coronary angioplasty implant and graft: Secondary | ICD-10-CM

## 2021-09-28 DIAGNOSIS — I25118 Atherosclerotic heart disease of native coronary artery with other forms of angina pectoris: Principal | ICD-10-CM | POA: Diagnosis present

## 2021-09-28 DIAGNOSIS — Z885 Allergy status to narcotic agent status: Secondary | ICD-10-CM

## 2021-09-28 DIAGNOSIS — Z888 Allergy status to other drugs, medicaments and biological substances status: Secondary | ICD-10-CM

## 2021-09-28 DIAGNOSIS — E785 Hyperlipidemia, unspecified: Secondary | ICD-10-CM | POA: Diagnosis present

## 2021-09-28 DIAGNOSIS — I110 Hypertensive heart disease with heart failure: Secondary | ICD-10-CM | POA: Diagnosis present

## 2021-09-28 DIAGNOSIS — D696 Thrombocytopenia, unspecified: Secondary | ICD-10-CM | POA: Diagnosis present

## 2021-09-28 DIAGNOSIS — R4189 Other symptoms and signs involving cognitive functions and awareness: Secondary | ICD-10-CM

## 2021-09-28 DIAGNOSIS — R072 Precordial pain: Secondary | ICD-10-CM | POA: Diagnosis not present

## 2021-09-28 DIAGNOSIS — Z823 Family history of stroke: Secondary | ICD-10-CM

## 2021-09-28 DIAGNOSIS — F419 Anxiety disorder, unspecified: Secondary | ICD-10-CM | POA: Diagnosis present

## 2021-09-28 DIAGNOSIS — Z8249 Family history of ischemic heart disease and other diseases of the circulatory system: Secondary | ICD-10-CM

## 2021-09-28 DIAGNOSIS — Z91199 Patient's noncompliance with other medical treatment and regimen due to unspecified reason: Secondary | ICD-10-CM

## 2021-09-28 DIAGNOSIS — K5900 Constipation, unspecified: Secondary | ICD-10-CM | POA: Diagnosis present

## 2021-09-28 DIAGNOSIS — I5022 Chronic systolic (congestive) heart failure: Secondary | ICD-10-CM | POA: Diagnosis not present

## 2021-09-28 DIAGNOSIS — R0789 Other chest pain: Secondary | ICD-10-CM

## 2021-09-28 DIAGNOSIS — F32A Depression, unspecified: Secondary | ICD-10-CM | POA: Diagnosis present

## 2021-09-28 DIAGNOSIS — I10 Essential (primary) hypertension: Secondary | ICD-10-CM | POA: Diagnosis present

## 2021-09-28 DIAGNOSIS — F0393 Unspecified dementia, unspecified severity, with mood disturbance: Secondary | ICD-10-CM | POA: Diagnosis present

## 2021-09-28 DIAGNOSIS — Z7982 Long term (current) use of aspirin: Secondary | ICD-10-CM

## 2021-09-28 DIAGNOSIS — I252 Old myocardial infarction: Secondary | ICD-10-CM

## 2021-09-28 DIAGNOSIS — R001 Bradycardia, unspecified: Secondary | ICD-10-CM

## 2021-09-28 DIAGNOSIS — Z720 Tobacco use: Secondary | ICD-10-CM | POA: Diagnosis present

## 2021-09-28 DIAGNOSIS — F17210 Nicotine dependence, cigarettes, uncomplicated: Secondary | ICD-10-CM | POA: Diagnosis present

## 2021-09-28 DIAGNOSIS — Z79810 Long term (current) use of selective estrogen receptor modulators (SERMs): Secondary | ICD-10-CM

## 2021-09-28 DIAGNOSIS — Z9011 Acquired absence of right breast and nipple: Secondary | ICD-10-CM

## 2021-09-28 DIAGNOSIS — C7800 Secondary malignant neoplasm of unspecified lung: Secondary | ICD-10-CM | POA: Diagnosis present

## 2021-09-28 DIAGNOSIS — R079 Chest pain, unspecified: Secondary | ICD-10-CM | POA: Diagnosis present

## 2021-09-28 DIAGNOSIS — Z95810 Presence of automatic (implantable) cardiac defibrillator: Secondary | ICD-10-CM

## 2021-09-28 DIAGNOSIS — I429 Cardiomyopathy, unspecified: Secondary | ICD-10-CM | POA: Diagnosis present

## 2021-09-28 DIAGNOSIS — Z79899 Other long term (current) drug therapy: Secondary | ICD-10-CM

## 2021-09-28 DIAGNOSIS — C50211 Malignant neoplasm of upper-inner quadrant of right female breast: Secondary | ICD-10-CM | POA: Diagnosis present

## 2021-09-28 DIAGNOSIS — Z853 Personal history of malignant neoplasm of breast: Secondary | ICD-10-CM

## 2021-09-28 DIAGNOSIS — Z7902 Long term (current) use of antithrombotics/antiplatelets: Secondary | ICD-10-CM

## 2021-09-28 DIAGNOSIS — M1990 Unspecified osteoarthritis, unspecified site: Secondary | ICD-10-CM | POA: Diagnosis present

## 2021-09-28 LAB — URINALYSIS, COMPLETE (UACMP) WITH MICROSCOPIC
Bilirubin Urine: NEGATIVE
Glucose, UA: NEGATIVE mg/dL
Ketones, ur: NEGATIVE mg/dL
Leukocytes,Ua: NEGATIVE
Nitrite: NEGATIVE
Protein, ur: NEGATIVE mg/dL
Specific Gravity, Urine: 1.009 (ref 1.005–1.030)
pH: 7 (ref 5.0–8.0)

## 2021-09-28 LAB — ECHOCARDIOGRAM COMPLETE
AR max vel: 1.44 cm2
AV Peak grad: 4.3 mmHg
Ao pk vel: 1.04 m/s
Area-P 1/2: 4.26 cm2
Calc EF: 35 %
Height: 62 in
S' Lateral: 2.88 cm
Single Plane A2C EF: 33 %
Single Plane A4C EF: 32.7 %
Weight: 1328 oz

## 2021-09-28 LAB — CBC
HCT: 36.6 % (ref 36.0–46.0)
Hemoglobin: 12 g/dL (ref 12.0–15.0)
MCH: 30.2 pg (ref 26.0–34.0)
MCHC: 32.8 g/dL (ref 30.0–36.0)
MCV: 92 fL (ref 80.0–100.0)
Platelets: 93 10*3/uL — ABNORMAL LOW (ref 150–400)
RBC: 3.98 MIL/uL (ref 3.87–5.11)
RDW: 14.3 % (ref 11.5–15.5)
WBC: 7.7 10*3/uL (ref 4.0–10.5)
nRBC: 0 % (ref 0.0–0.2)

## 2021-09-28 LAB — BASIC METABOLIC PANEL
Anion gap: 7 (ref 5–15)
BUN: 16 mg/dL (ref 8–23)
CO2: 24 mmol/L (ref 22–32)
Calcium: 8.5 mg/dL — ABNORMAL LOW (ref 8.9–10.3)
Chloride: 107 mmol/L (ref 98–111)
Creatinine, Ser: 1.11 mg/dL — ABNORMAL HIGH (ref 0.44–1.00)
GFR, Estimated: 52 mL/min — ABNORMAL LOW (ref >60)
Glucose, Bld: 94 mg/dL (ref 70–99)
Potassium: 4.1 mmol/L (ref 3.5–5.1)
Sodium: 138 mmol/L (ref 135–145)

## 2021-09-28 LAB — TROPONIN I (HIGH SENSITIVITY)
Troponin I (High Sensitivity): 12 ng/L (ref <18)
Troponin I (High Sensitivity): 11 ng/L (ref <18)

## 2021-09-28 LAB — TSH: TSH: 3.404 u[IU]/mL (ref 0.350–4.500)

## 2021-09-28 LAB — RAPID HIV SCREEN (HIV 1/2 AB+AG)
HIV 1/2 Antibodies: NONREACTIVE
HIV-1 P24 Antigen - HIV24: NONREACTIVE

## 2021-09-28 LAB — FOLATE: Folate: 37 ng/mL (ref >5.9)

## 2021-09-28 MED ORDER — ALBUTEROL SULFATE (2.5 MG/3ML) 0.083% IN NEBU: 2.5000 mg | INHALATION_SOLUTION | Freq: Four times a day (QID) | RESPIRATORY_TRACT | Status: DC | PRN

## 2021-09-28 MED ORDER — POLYVINYL ALCOHOL 1.4 % OP SOLN
1.0000 [drp] | Freq: Four times a day (QID) | OPHTHALMIC | Status: DC | PRN
  Filled 2021-09-28: qty 15

## 2021-09-28 MED ORDER — ONDANSETRON HCL 4 MG/2ML IJ SOLN: 4.0000 mg | Freq: Four times a day (QID) | INTRAMUSCULAR | Status: DC | PRN

## 2021-09-28 MED ORDER — HYDROXYZINE HCL 25 MG PO TABS
25.0000 mg | ORAL_TABLET | Freq: Three times a day (TID) | ORAL | Status: DC
  Administered 2021-09-28 – 2021-10-03 (×14): 25 mg via ORAL
  Filled 2021-09-28 (×14): qty 1

## 2021-09-28 MED ORDER — POLYETHYLENE GLYCOL 3350 17 G PO PACK: 17.0000 g | PACK | Freq: Every day | ORAL | Status: DC | PRN

## 2021-09-28 MED ORDER — ROSUVASTATIN CALCIUM 10 MG PO TABS
10.0000 mg | ORAL_TABLET | Freq: Every day | ORAL | Status: DC
  Administered 2021-09-28 – 2021-10-03 (×6): 10 mg via ORAL
  Filled 2021-09-28 (×7): qty 1

## 2021-09-28 MED ORDER — NICOTINE 7 MG/24HR TD PT24
7.0000 mg | MEDICATED_PATCH | Freq: Every day | TRANSDERMAL | Status: DC
Start: 2021-09-28 — End: 2021-10-04
  Administered 2021-10-03: 7 mg via TRANSDERMAL
  Filled 2021-09-28 (×6): qty 1

## 2021-09-28 MED ORDER — HALOPERIDOL LACTATE 5 MG/ML IJ SOLN
1.0000 mg | Freq: Four times a day (QID) | INTRAMUSCULAR | Status: DC | PRN
  Administered 2021-09-29: 1 mg via INTRAVENOUS
  Filled 2021-09-28: qty 1

## 2021-09-28 MED ORDER — CLOPIDOGREL BISULFATE 75 MG PO TABS
75.0000 mg | ORAL_TABLET | Freq: Every day | ORAL | Status: DC
  Administered 2021-09-28 – 2021-10-03 (×6): 75 mg via ORAL
  Filled 2021-09-28 (×6): qty 1

## 2021-09-28 MED ORDER — VENLAFAXINE HCL ER 37.5 MG PO CP24: 37.5000 mg | ORAL_CAPSULE | Freq: Every day | ORAL | Status: DC

## 2021-09-28 MED ORDER — ASPIRIN EC 81 MG PO TBEC
81.0000 mg | DELAYED_RELEASE_TABLET | Freq: Every day | ORAL | Status: DC
  Administered 2021-09-28 – 2021-10-03 (×6): 81 mg via ORAL
  Filled 2021-09-28 (×6): qty 1

## 2021-09-28 MED ORDER — CARVEDILOL 25 MG PO TABS
25.0000 mg | ORAL_TABLET | Freq: Two times a day (BID) | ORAL | Status: DC
  Administered 2021-09-28 – 2021-10-02 (×10): 25 mg via ORAL
  Filled 2021-09-28 (×7): qty 1
  Filled 2021-09-28: qty 4
  Filled 2021-09-28 (×3): qty 1

## 2021-09-28 MED ORDER — ACETAMINOPHEN 325 MG PO TABS
650.0000 mg | ORAL_TABLET | ORAL | Status: DC | PRN
  Administered 2021-10-03: 650 mg via ORAL
  Filled 2021-09-28: qty 2

## 2021-09-28 MED ORDER — PANTOPRAZOLE SODIUM 40 MG PO TBEC
40.0000 mg | DELAYED_RELEASE_TABLET | Freq: Every day | ORAL | Status: DC
  Administered 2021-09-28 – 2021-10-03 (×6): 40 mg via ORAL
  Filled 2021-09-28 (×6): qty 1

## 2021-09-28 MED ORDER — ISOSORBIDE MONONITRATE ER 30 MG PO TB24
30.0000 mg | ORAL_TABLET | Freq: Every day | ORAL | Status: DC
  Administered 2021-09-28 – 2021-10-03 (×6): 30 mg via ORAL
  Filled 2021-09-28 (×6): qty 1

## 2021-09-28 MED ORDER — LORAZEPAM 0.5 MG PO TABS
0.2500 mg | ORAL_TABLET | Freq: Two times a day (BID) | ORAL | Status: DC
  Administered 2021-09-28 – 2021-10-03 (×10): 0.25 mg via ORAL
  Filled 2021-09-28 (×10): qty 1

## 2021-09-28 MED ORDER — IRBESARTAN 150 MG PO TABS
300.0000 mg | ORAL_TABLET | Freq: Every day | ORAL | Status: DC
Start: 2021-09-29 — End: 2021-10-04
  Administered 2021-09-29 – 2021-10-03 (×5): 300 mg via ORAL
  Filled 2021-09-28 (×5): qty 2

## 2021-09-28 MED ORDER — ENOXAPARIN SODIUM 30 MG/0.3ML IJ SOSY: 30.0000 mg | PREFILLED_SYRINGE | INTRAMUSCULAR | Status: DC

## 2021-09-28 MED ORDER — TAMOXIFEN CITRATE 10 MG PO TABS
20.0000 mg | ORAL_TABLET | Freq: Every day | ORAL | Status: DC
  Administered 2021-09-28 – 2021-10-03 (×6): 20 mg via ORAL
  Filled 2021-09-28 (×6): qty 2

## 2021-09-28 NOTE — Assessment & Plan Note (Addendum)
Appears chronic.  No evidence of bleeding ?Monitor closely during this hospitalization ?

## 2021-09-28 NOTE — ED Notes (Signed)
Having trouble obtaining legible EKG. Repeat completed and given to provider [place]. Talked with Charge RN since voltage still reading low and have tried portable machine as well.  ?

## 2021-09-28 NOTE — ED Notes (Signed)
Confirmed with Eko stethoscope that pt's rhythm abnormal/irregular; HR about 48bmp currently; murmur noted; early/irregular beats noted on auscultation.  ?

## 2021-09-28 NOTE — ED Notes (Signed)
Pt reports L lower chest pain is chronic and intermittent; states a primary doctor told her this pain is from her "chest wall". States it is currently "not more than a 2 or 3 out of 10".  ?

## 2021-09-28 NOTE — Assessment & Plan Note (Addendum)
Continue Avapro and carvedilol ?

## 2021-09-28 NOTE — Assessment & Plan Note (Addendum)
Patient with known history of coronary artery disease status post stent/angioplasty. ?She presented with sudden onset of left-sided chest pain that woke her up from sleep. ?Chest pain resolved with nitroglycerin,  concerning for ACS. ?EKG unremarkable, troponins negative. ?Continue aspirin, Plavix, carvedilol, statins. ?Cardiology is consulted, started on Imdur 30 mg daily. ?Patient does not need any ischemic evaluation at this time. ?Cardiology recommended outpatient follow-up in 1-2 weeks . ?Patient reports chest pain has resolved. ?Echo showed LVEF 30-35%, stable as prior echo. ? ?

## 2021-09-28 NOTE — ED Notes (Signed)
Pt. Received mealtray, ate 100%, up to room sink to wash hand and toilet. NAD, gait steady. ?

## 2021-09-28 NOTE — Progress Notes (Signed)
*  PRELIMINARY RESULTS* ?Echocardiogram ?2D Echocardiogram has been performed. ? ?Christine [name] ?09/28/2021, 3:38 PM ?

## 2021-09-28 NOTE — Assessment & Plan Note (Addendum)
Continue tamoxifen 

## 2021-09-28 NOTE — Progress Notes (Signed)
Patient unable to remember meds given today. Frequently fidgeting with items in room and moving things from one place to another.  Sitter at bedside ? ?

## 2021-09-28 NOTE — ED Notes (Signed)
Pt given warm blanket. Pt alert and calm. Pt denies any needs currently.  ?

## 2021-09-28 NOTE — ED Notes (Signed)
EKG to EDP Christine [name] in person. Notified pt's HR 35-47 sustained. Pt denies CP but states does "feel funny". Pt's resp reg/unlabored; skin dry; this RN remains at bedside.  ?

## 2021-09-28 NOTE — Progress Notes (Signed)
Contacted by patient's RN because of intermittent episodes of confusion and patient wanting to leave. ?Patient was seen and examined at bedside and is awake, alert and oriented to person place and time.  She is able to tell me why she presented to the hospital but then occasionally goes off in tangents.  No documented history of dementia or psych issues. ?Attempted to contact patient's niece who is listed as her emergency contact, Felicita Gage.  Unable to leave a voicemail at this time ?We will obtain CT scan of the head as well as dementia work-up ?Patient will likely require a safety sitter ?

## 2021-09-28 NOTE — Assessment & Plan Note (Addendum)
Continue venlafaxine. ?Patient patient seems to have dementia, confusion and forgetfulness. ?Psych consulted for evaluation.  Does not meet criteria for inpatient psych hospitalization ?

## 2021-09-28 NOTE — ED Triage Notes (Signed)
In from home via EMS; woken from sleep with heavy chest pain; denies radiation anywhere; cardiac history with a a defibrillator; 1 spray of SL nitro with EMS; 20g at R fa by EMS; ASA 324. 117/54 BP; 60 HR; 100% RA with EMS.  ?

## 2021-09-28 NOTE — Assessment & Plan Note (Signed)
Smoking cessation has been discussed with patient in detail ?She states that she has cut back on the number of cigarettes that she smokes ?We will place patient on a nicotine transdermal patch 7 mg daily ?

## 2021-09-28 NOTE — Progress Notes (Signed)
Patient noted to have progressive confusion after arriving from ED.  Answers questions appropriately but gets confused rather quickly.  At times she believes she's in her apartment.  Attempted to leave the unit at one point.  MD notified and came to bedside to assess.  Sitter placed with patient for safety observation.  Still demonstrating frequent intervals off confusion.   ?

## 2021-09-28 NOTE — ED Notes (Signed)
Provider C. Karma Greaser at bedside.  ?

## 2021-09-28 NOTE — ED Notes (Signed)
This RN requested meal tray for pt, as she did not get breakfast delivered. ?

## 2021-09-28 NOTE — Consult Note (Signed)
[facility] ? ?CARDIOLOGY CONSULT NOTE  ?Patient ID: ?Christine [name] ?MRN: [id number] ?DOB/AGE: March 27, 1946 76 y.o. ? ?Admit date: 09/28/2021 ?Referring Physician agbata ?Primary Physician Baxter Hire, MD ?Primary Cardiologist Virl Axe ?Reason for Consultation chest pain, negative troponin ? ?HPI:  ?Christine [name] is a 76 year old female with history of HFrEF (EF 30 to 35%) with an abandoned ICD for several years, CAD s/p multiple stents 2006, PAD s/p renal artery stent 02/2019, breast cancer s/p mastectomy with metastases to the lungs, hypertension, hyperlipidemia dents with chest pain.  Cardiology is consulted for further assistance. ? ?She reportedly was woken from sleep at 1 AM today with crushing chest pain and a weight on her left anterior chest wall.  She had no other associated shortness of breath, no vomiting no diaphoresis or palpitations.  This lasted ~ 30 minutes before calling 911. She called EMS and was given nitroglycerin with resolution.  In the emergency department her vital signs have been stable with a heart rate in the 50s, and a blood pressure of 114/61.  Her evaluation is notable for 2 negative troponins.  EKG shows sinus bradycardia with PACs and an anterior infarct, similar to prior. ? ?Notably she was last seen by her primary care doctor in February 2022 at which time she described ongoing chest wall pain.  She was referred to [facility] clinic cardiology at that time but never scheduled her appointment.  She was last seen in the wound clinic on 12/27/2020 however she had a persistent wound on her left breast related to her carcinoma. ? ?She had a hard time remembering why she was here, and has very little medical literacy regarding her cancer. She also has not seen a physician in over a year.  ? ?Review of systems complete and found to be negative unless listed above  ? ? ? ?Past Medical History:  ?Diagnosis Date  ? Allergic rhinitis   ? Anxiety   ? Breast cancer ([place])   ? 11/26/20 Pt  reports wound/lesion on left breast  ? Chronic systolic heart failure ([place])   ? Coronary artery disease   ? GERD (gastroesophageal reflux disease)   ? Heart murmur   ? Hyperlipidemia   ? Hypertension   ? Implantable defibrillator [employer]   ? DOI 2006  ? MI (myocardial infarction) ([place])   ? Osteoarthritis   ? Wears dentures   ? full upper and lower  ?  ?Past Surgical History:  ?Procedure Laterality Date  ? CHF exacerbation  10/07  ? CORONARY STENT PLACEMENT  8/06  ? 3 stents; myocardial infarction  ? LOWER EXTREMITY ANGIOGRAPHY Left 02/15/2019  ? Procedure: LOWER EXTREMITY ANGIOGRAPHY;  Surgeon: Katha Cabal, MD;  Location: [facility];  Service: Cardiovascular;  Laterality: Left;  ? NM MYOVIEW LTD  1/08  ? EF 39%, no sx ischemia  ? PACEMAKER PLACEMENT  2/07  ? Defibrillator  ? RENAL ARTERY STENT Left   ?  ?Medications Prior to Admission  ?Medication Sig Dispense Refill Last Dose  ? aspirin EC 81 MG tablet Take 81 mg by mouth daily.   Past Week  ? bisacodyl (DULCOLAX) 5 MG EC tablet Take 5 mg by mouth daily as needed for mild constipation or moderate constipation.   Past Month  ? carvedilol (COREG) 25 MG tablet TAKE 1 TABLET BY MOUTH TWICE A DAY 60 tablet 2 09/27/2021  ? clopidogrel (PLAVIX) 75 MG tablet TAKE 1 TABLET BY MOUTH ONCE DAILY 30 tablet 4 09/27/2021  ?  hydrOXYzine (ATARAX) 25 MG tablet Take 25 mg by mouth 3 (three) times daily.   09/27/2021  ? LORazepam (ATIVAN) 0.5 MG tablet Take 0.25 mg by mouth 2 (two) times daily.    Past Month  ? pantoprazole (PROTONIX) 40 MG tablet Take 40 mg by mouth daily.    09/27/2021  ? Polyethylene Glycol 3350 (PEG 3350) POWD Take 17 g by mouth daily as needed (constipation.).    Past Month  ? rosuvastatin (CRESTOR) 10 MG tablet Take 10 mg by mouth daily.    09/27/2021  ? tamoxifen (NOLVADEX) 20 MG tablet Take 1 tablet (20 mg total) by mouth daily. 90 tablet 3 09/27/2021  ? traMADol (ULTRAM) 50 MG tablet Take 25 mg by mouth 2 (two) times daily.   09/27/2021  ? valsartan  (DIOVAN) 320 MG tablet Take 320 mg by mouth daily at 12 noon.    09/27/2021  ? albuterol (VENTOLIN HFA) 108 (90 Base) MCG/ACT inhaler Inhale 2 puffs into the lungs 4 (four) times daily as needed (wheezing/shortness of breath). (Patient not taking: Reported on 09/28/2021)   Not Taking  ? furosemide (LASIX) 40 MG tablet Take 40 mg by mouth daily as needed (fluid retention/swelling). (Patient not taking: Reported on 09/28/2021)   Not Taking  ? tetrahydrozoline 0.05 % ophthalmic solution Place 1 drop into both eyes 3 (three) times daily as needed (dry/irritated eyes). (Patient not taking: Reported on 09/28/2021)   Not Taking  ? venlafaxine XR (EFFEXOR-XR) 37.5 MG 24 hr capsule Take 37.5 mg by mouth daily. (Patient not taking: Reported on 09/28/2021)   Not Taking  ? ?Social History  ? ?Socioeconomic History  ? Marital status: Divorced  ?  Spouse name: Not on file  ? Number of children: 1  ? Years of education: Not on file  ? Highest education level: Not on file  ?Occupational History  ? Occupation: Artist  ?  Comment: Disabled  ?Tobacco Use  ? Smoking status: Every Day  ?  Packs/day: 0.50  ?  Years: 12.00  ?  Pack years: 6.00  ?  Types: Cigarettes  ? Smokeless tobacco: Never  ? Tobacco comments:  ?     ?Vaping Use  ? Vaping Use: Never used  ?Substance and Sexual Activity  ? Alcohol use: No  ?  Comment: 2-3 times per month  ? Drug use: No  ? Sexual activity: Not on file  ?Other Topics Concern  ? Not on file  ?Social History Narrative  ? Pt is disabled but is hoping to back to work part time.  ? Has been divorced twice.  ? Gets regular exercise.  ? ?Social Determinants of Health  ? ?Financial Resource Strain: Not on file  ?Food Insecurity: Not on file  ?Transportation Needs: Not on file  ?Physical Activity: Not on file  ?Stress: Not on file  ?Social Connections: Not on file  ?Intimate Partner Violence: Not on file  ?  ?Family History  ?Problem Relation Age of Onset  ? Stroke Mother 17  ? Heart attack Father   ?  Coronary artery disease Father   ? Heart attack Brother   ? Coronary artery disease Other   ? Hypertension Other   ? Cancer Neg Hx   ?     No breast or colon cancer  ?  ? ? ?Review of systems complete and found to be negative unless listed above  ? ? ? ? ?PHYSICAL EXAM ?Vitals:  ? 09/28/21 1200 09/28/21 1249  ?BP: 136/82 114/61  ?  Pulse: (!) 54 (!) 54  ?Resp: 20 17  ?Temp: 98.2 ?F (36.8 ?C) 98 ?F (36.7 ?C)  ?SpO2: 100% 96%  ? ? ? ? ?General: cacechtic appearing.  ?HEENT:  Normocephalic and atramatic ?Neck:  No JVD.  ?Lungs: Clear bilaterally to auscultation and percussion. ?Heart: HRRR . Normal S1 and S2 without gallops or murmurs.  ?Abdomen: Bowel sounds are positive, abdomen soft and non-tender  ?Msk:  Back normal, normal gait. Normal strength and tone for age. Open wound on left chest wall. Cancerous appearing lesion on clavicle.  ?Extremities: No clubbing, cyanosis or edema.   ?Neuro: Alert and oriented X 3. ?Psych:  Good affect, responds appropriately ? ?Labs: ?  ?Lab Results  ?Component Value Date  ? WBC 7.7 09/28/2021  ? HGB 12.0 09/28/2021  ? HCT 36.6 09/28/2021  ? MCV 92.0 09/28/2021  ? PLT 93 (L) 09/28/2021  ?  ?Recent Labs  ?Lab 09/28/21 ?2956  ?NA 138  ?K 4.1  ?CL 107  ?CO2 24  ?BUN 16  ?CREATININE 1.11*  ?CALCIUM 8.5*  ?GLUCOSE 94  ? ?Lab Results  ?Component Value Date  ? TROPONINI <0.03 12/20/2014  ?  ?Lab Results  ?Component Value Date  ? CHOL 105 08/31/2011  ? CHOL 194 06/04/2009  ? CHOL 185 11/28/2008  ? ?Lab Results  ?Component Value Date  ? HDL 26 (L) 08/31/2011  ? HDL 38.20 (L) 06/04/2009  ? HDL 40.90 11/28/2008  ? ?Lab Results  ?Component Value Date  ? Collins 58 08/31/2011  ? LDLCALC 113 (H) 11/28/2008  ? LDLCALC 116 (H) 02/02/2008  ? ?Lab Results  ?Component Value Date  ? TRIG 105 08/31/2011  ? TRIG 218.0 (H) 06/04/2009  ? TRIG 158.0 (H) 11/28/2008  ? ?Lab Results  ?Component Value Date  ? CHOLHDL 5 06/04/2009  ? CHOLHDL 5 11/28/2008  ? CHOLHDL 4.4 CALC 02/02/2008  ? ?Lab Results  ?Component  Value Date  ? LDLDIRECT 126.0 06/04/2009  ? LDLDIRECT 118.9 11/16/2006  ? LDLDIRECT 117.1 08/25/2006  ?  ?  ?Radiology: DG Chest Portable 1 View ? ?Result Date: 09/28/2021 ?CLINICAL DATA:  76 year old female with hi

## 2021-09-28 NOTE — ED Notes (Signed)
Red, lav, lt grn, and blue tubes sent to lab.  ?

## 2021-09-28 NOTE — Progress Notes (Signed)
PHARMACIST - PHYSICIAN COMMUNICATION ? ?CONCERNING:  Enoxaparin (Lovenox) for DVT Prophylaxis  ? ? ?RECOMMENDATION: ?Patient was prescribed enoxaprin '40mg'$  q24 hours for VTE prophylaxis.  ? ?Filed Weights  ? 09/28/21 0502  ?Weight: 37.6 kg (83 lb)  ? ? ?Body mass index is 15.18 kg/m?. ? ?Estimated Creatinine Clearance: 26.4 mL/min (A) (by C-G formula based on SCr of 1.11 mg/dL (H)). ? ?Patient is candidate for enoxaparin '30mg'$  every 24 hours based on CrCl <30m/min or Weight <45kg ? ?DESCRIPTION: ?Pharmacy has [facility] dose [facility] ? ?Patient is now receiving enoxaparin 30 mg every 24 hours  ? ? ?CWynelle Cleveland PharmD ?Pharmacy Resident  ?09/28/2021 ?8:47 AM ?

## 2021-09-28 NOTE — ED Provider Notes (Signed)
? ?[facility] ?Provider Note ? ? ? Event Date/Time  ? First MD Initiated Contact with Patient 09/28/21 0510   ?  (approximate) ? ? ?History  ? ?Chest Pain ? ? ?HPI ? ?Christine [name] is a 76 y.o. female with medical history includes, but is not limited to, CAD status post PCI with 3 stents placed about 17 years ago and CHF with implanted defibrillator. ? ?She presents tonight by EMS for evaluation of chest pain.  She reports that she woke up prior to calling EMS and she felt a crushing and severe heavy weight on her chest.  It was so bad that she felt like she could not get up out of bed.  She did not really feel short of breath but she felt like something was holding her to the bed because of the heaviness on her chest.  It slowly got better over time and now she no longer feels it but it was very scary to her so she called EMS. ? ?She has not been feeling ill recently.  No recent swelling or weight gain.  No history of blood clots in the legs of the lungs.  She has been compliant with her medications. ? ?She said that Dr. Virl Axe is her cardiologist.  She does not believe she has had a catheterization since her PCI about 17 years ago.  She said that she has had her defibrillator "for a long time" and that she does not think it is a pacemaker.  She described it as "ancient". ?  ? ? ?Physical Exam  ? ?Triage Vital Signs: ?ED Triage Vitals  ?Enc Vitals Group  ?   BP 09/28/21 0458 125/69  ?   Pulse Rate 09/28/21 0455 77  ?   Resp 09/28/21 0458 15  ?   Temp 09/28/21 0453 98.1 ?F (36.7 ?C)  ?   Temp Source 09/28/21 0453 Oral  ?   SpO2 09/28/21 0455 96 %  ?   Weight 09/28/21 0502 37.6 kg (83 lb)  ?   Height 09/28/21 0502 1.575 m ('5\' 2"'$ )  ?   Head Circumference --   ?   Peak Flow --   ?   Pain Score 09/28/21 0502 0  ?   Pain Loc --   ?   Pain Edu? --   ?   Excl. in [place]? --   ? ? ?Most recent vital signs: ?Vitals:  ? 09/28/21 0645 09/28/21 0700  ?BP:  119/67  ?Pulse: (!) 43 (!) 49  ?Resp: 11 14   ?Temp:    ?SpO2: 96% 94%  ? ? ? ?General: Awake, no distress.  Elderly, appears somewhat older than chronological age. ?CV:  Good peripheral perfusion.  Bradycardia either with occasional arrhythmia or PVC. ?Resp:  Normal effort.  Lungs are clear to auscultation. ?Abd:  No distention.  No tenderness to palpation. ? ? ?ED Results / Procedures / Treatments  ? ?Labs ?(all labs ordered are listed, but only abnormal results are displayed) ?Labs Reviewed  ?BASIC METABOLIC PANEL - Abnormal; Notable for the following components:  ?    Result Value  ? Creatinine, Ser 1.11 (*)   ? Calcium 8.5 (*)   ? GFR, Estimated 52 (*)   ? All other components within normal limits  ?CBC - Abnormal; Notable for the following components:  ? Platelets 93 (*)   ? All other components within normal limits  ?TROPONIN I (HIGH SENSITIVITY)  ?TROPONIN I (HIGH SENSITIVITY)  ? ? ? ?  EKG ? ?ED ECG REPORT ?IHinda Kehr, the attending physician, personally viewed and interpreted this ECG. ? ?Date: 09/28/2021 ?EKG Time: 5:01 AM: ?Rate: 53 ?Rhythm: Sinus bradycardia ?QRS Axis: normal ?Intervals: EKG interprets prolonged QTc at 536 ms.   ?ST/T Wave abnormalities: Non-specific ST segment / T-wave changes, but no clear evidence of acute ischemia. ?Narrative Interpretation: Low amplitude complexes, difficult to interpret, baseline wander in lateral leads.  Will repeat. ? ?ED ECG REPORT ?IHinda Kehr, the attending physician, personally viewed and interpreted this ECG. ? ?Date: 09/28/2021 ?EKG Time: 5:23 AM ?Rate: 51 ?Rhythm: Sinus bradycardia ?QRS Axis: Left axis deviation ?Intervals: normal ?ST/T Wave abnormalities: Inverted T waves in leads V4 and V5. ?Narrative Interpretation: no definitive evidence of acute ischemia; low voltage QRS, still difficult to interpret, but does appear consistent with sinus bradycardia rather than heart block ? ? ?RADIOLOGY ?I personally reviewed the patient's chest x-ray and I see no evidence of acute abnormality.   External device is clearly visible on the left side of her chest. ? ? ? ?PROCEDURES: ? ?Critical Care performed: No ? ?.1-3 Lead EKG Interpretation ?Performed by: Hinda Kehr, MD ?Authorized by: Hinda Kehr, MD  ? ?  Interpretation: abnormal   ?  ECG rate:  51 ?  ECG rate assessment: bradycardic   ?  Rhythm: sinus bradycardia   ?  Ectopy: none   ?  Conduction: normal   ? ? ?MEDICATIONS ORDERED IN ED: ?Medications - No data to display ? ? ?IMPRESSION / MDM / ASSESSMENT AND PLAN / ED COURSE  ?I reviewed the triage vital signs and the nursing notes. ?             ?               ? ?Differential diagnosis includes, but is not limited to, ACS, heart block, nonspecific arrhythmia, PE. ? ?The patient is on the cardiac monitor to evaluate for evidence of arrhythmia and/or significant heart rate changes. ? ?Initial EKG was very difficult to interpret and the patient was occasionally having runs on the monitor of bradycardia down in the 20s.  However I suspect part of that was system not being able to interpret the low amplitude QRS complexes.  Repeat EKG was a little bit better and demonstrated sinus bradycardia without any concern for heart block. ? ?The patient is no longer having chest pain which is somewhat reassuring as well.   ? ?As documented above I reviewed the patient's chest x-ray and I see no evidence of acute abnormality. ? ?Vital signs are stable other than persistent bradycardia but she seems asymptomatic from the bradycardia. ? ?I suspect the patient will benefit from inpatient observation and cardiology evaluation but I will await the results of her labs and see if she has any other additional episodes of chest pain.  No indication for emergent cardiology consultation at this time. ? ?Clinical Course as of 09/28/21 0824  ?Sat Sep 28, 2021  ?4481 Troponin I (High Sensitivity): 12 ?Troponin is within normal limits [CF]  ?0623 I reviewed the patient's lab results and her basic metabolic panel,  high-sensitivity troponin, and CBC are all within normal limits.  I reassessed the patient and she has not had any additional episodes of chest pressure. ? ?However, given the acute onset and severity of the chest pressure where she felt like she could not move or get up and felt a crushing weight on her chest, combined with her history of severe coronary artery disease  status post 3 stents 17 years ago, and her implanted defibrillator and/or pacemaker with persistent bradycardia, I believe she is at high risk for decompensation and deserves a hospitalization for chest pain observation and cardiology evaluation. ? ?I am consulting the hospitalist team for admission.  The patient agrees with the plan. [CF]  ?  ?Clinical Course User Index ?[CF] Hinda Kehr, MD  ? ? ? ?FINAL CLINICAL IMPRESSION(S) / ED DIAGNOSES  ? ?Final diagnoses:  ?Chest pressure  ?Bradycardia  ? ? ? ?Rx / DC Orders  ? ?ED Discharge Orders   ? ? None  ? ?  ? ? ? ?Note:  This document was prepared using Dragon voice recognition software and may include unintentional dictation errors. ?  ?Hinda Kehr, MD ?09/28/21 [phone] ? ?

## 2021-09-28 NOTE — H&P (Addendum)
?History and Physical  ? ? ?Patient: Christine [name]:[id number] DOB: Mar 31, 1946 ?DOA: 09/28/2021 ?DOS: the patient was seen and examined on 09/28/2021 ?PCP: Baxter Hire, MD  ?Patient coming from: Home ? ?Chief Complaint:  ?Chief Complaint  ?Patient presents with  ? Chest Pain  ? ?HPI: Christine [name] is a 76 y.o. female with medical history significant for coronary artery disease status post stent angioplasty status post AICD placement, chronic systolic heart failure, history of breast cancer and anxiety who presents to the ER for evaluation of chest pain which woke her up early this morning at about 1 AM.  She describes it as a crushing and severe heavy weight on her left anterior chest wall.  Pain was nonradiating and she denies having any shortness of breath, nausea, no vomiting, no diaphoresis or palpitations.  Symptoms lasted for about half an hour and she called EMS.  They administered 1 sublingual nitroglycerin. ?At the time of my evaluation patient is chest pain-free. ?Patient denies having any abdominal pain, no fever, no chills, no cough, no changes in her bowel habits, no urinary symptoms, no blurred vision or focal deficit. ? ?Review of Systems: As mentioned in the history of present illness. All other systems reviewed and are negative. ?Past Medical History:  ?Diagnosis Date  ? Allergic rhinitis   ? Anxiety   ? Breast cancer ([place])   ? 11/26/20 Pt reports wound/lesion on left breast  ? Chronic systolic heart failure ([place])   ? Coronary artery disease   ? GERD (gastroesophageal reflux disease)   ? Heart murmur   ? Hyperlipidemia   ? Hypertension   ? Implantable defibrillator St Jude   ? DOI 2006  ? MI (myocardial infarction) ([place])   ? Osteoarthritis   ? Wears dentures   ? full upper and lower  ? ?Past Surgical History:  ?Procedure Laterality Date  ? CHF exacerbation  10/07  ? CORONARY STENT PLACEMENT  8/06  ? 3 stents; myocardial infarction  ? LOWER EXTREMITY ANGIOGRAPHY Left 02/15/2019  ? Procedure: LOWER  EXTREMITY ANGIOGRAPHY;  Surgeon: Katha Cabal, MD;  Location: [facility];  Service: Cardiovascular;  Laterality: Left;  ? NM MYOVIEW LTD  1/08  ? EF 39%, no sx ischemia  ? PACEMAKER PLACEMENT  2/07  ? Defibrillator  ? RENAL ARTERY STENT Left   ? ?Social History:  reports that she has been smoking cigarettes. She has a 6.00 pack-year smoking history. She has never used smokeless tobacco. She reports that she does not drink alcohol and does not use drugs. ? ?Allergies  ?Allergen Reactions  ? Atorvastatin Other (See Comments)  ?  increased LFT's  ? Lisinopril Cough  ? Oxycodone Hcl Itching and Other (See Comments)  ?  Restless & jittery  ? Simvastatin Other (See Comments)  ?  myalgias  ? ? ?Family History  ?Problem Relation Age of Onset  ? Stroke Mother 33  ? Heart attack Father   ? Coronary artery disease Father   ? Heart attack Brother   ? Coronary artery disease Other   ? Hypertension Other   ? Cancer Neg Hx   ?     No breast or colon cancer  ? ? ?Prior to Admission medications   ?Medication Sig Start Date End Date Taking? Authorizing Provider  ?albuterol (VENTOLIN HFA) 108 (90 Base) MCG/ACT inhaler Inhale 2 puffs into the lungs 4 (four) times daily as needed (wheezing/shortness of breath). 03/31/17   [provider]  ?aspirin  EC 81 MG tablet Take 81 mg by mouth daily.    [provider]  ?bisacodyl (DULCOLAX) 5 MG EC tablet Take 5 mg by mouth daily as needed for mild constipation or moderate constipation.    [provider]  ?carvedilol (COREG) 25 MG tablet TAKE 1 TABLET BY MOUTH TWICE A DAY 09/09/10   Viviana Simpler I, MD  ?clopidogrel (PLAVIX) 75 MG tablet TAKE 1 TABLET BY MOUTH ONCE DAILY 12/05/19   Schnier, Dolores Lory, MD  ?furosemide (LASIX) 40 MG tablet Take 40 mg by mouth daily as needed (fluid retention/swelling).    [provider]  ?hydrOXYzine (ATARAX) 25 MG tablet Take 25 mg by mouth 3 (three) times daily. 09/04/21   [provider]  ?LORazepam  (ATIVAN) 0.5 MG tablet Take 0.25 mg by mouth 2 (two) times daily.  03/13/17   [provider]  ?pantoprazole (PROTONIX) 40 MG tablet Take 40 mg by mouth daily.  02/25/17   [provider]  ?Polyethylene Glycol 3350 (PEG 3350) POWD Take 17 g by mouth daily as needed (constipation.).     [provider]  ?rosuvastatin (CRESTOR) 10 MG tablet Take 10 mg by mouth daily.     [provider]  ?tamoxifen (NOLVADEX) 20 MG tablet Take 1 tablet (20 mg total) by mouth daily. 12/29/18   Lloyd Huger, MD  ?tetrahydrozoline 0.05 % ophthalmic solution Place 1 drop into both eyes 3 (three) times daily as needed (dry/irritated eyes).    [provider]  ?traMADol (ULTRAM) 50 MG tablet Take 25 mg by mouth 2 (two) times daily.    [provider]  ?valsartan (DIOVAN) 320 MG tablet Take 320 mg by mouth daily at 12 noon.     [provider]  ?venlafaxine XR (EFFEXOR-XR) 37.5 MG 24 hr capsule Take 37.5 mg by mouth daily. 01/23/20   [provider]  ? ? ?Physical Exam: ?Vitals:  ? 09/28/21 0602 09/28/21 0630 09/28/21 0645 09/28/21 0700  ?BP:  114/66  119/67  ?Pulse: (!) 50 (!) 49 (!) 43 (!) 49  ?Resp:  '13 11 14  '$ ?Temp:      ?TempSrc:      ?SpO2:  95% 96% 94%  ?Weight:      ?Height:      ? ?Physical Exam ?Vitals and nursing note reviewed.  ?Constitutional:   ?   Comments: Thin and frail  ?HENT:  ?   Head: Normocephalic and atraumatic.  ?Eyes:  ?   Pupils: Pupils are equal, round, and reactive to light.  ?Cardiovascular:  ?   Rate and Rhythm: Normal rate and regular rhythm.  ?   Heart sounds: Normal heart sounds.  ?Pulmonary:  ?   Effort: Pulmonary effort is normal.  ?   Breath sounds: Normal breath sounds.  ?Abdominal:  ?   General: Bowel sounds are normal.  ?   Palpations: Abdomen is soft.  ?Musculoskeletal:     ?   General: Normal range of motion.  ?   Cervical back: Normal range of motion and neck supple.  ?Skin: ?   General: Skin is warm and dry.  ?   Comments:  Wound over left breast.  History of breast cancer  ?Neurological:  ?   General: No focal deficit present.  ?   Mental Status: She is alert.  ?Psychiatric:     ?   Mood and Affect: Mood normal.     ?   Behavior: Behavior normal.  ? ? ?Data Reviewed: ?  Relevant notes from primary care and specialist visits, past discharge summaries as available in EHR, including Care Everywhere. ?Prior diagnostic testing as pertinent to current admission diagnoses ?Updated medications and problem lists for reconciliation ?ED course, including vitals, labs, imaging, treatment and response to treatment ?Triage notes, nursing and pharmacy notes and ED provider's notes ?Notable results as noted in HPI ?Labs reviewed and essentially negative except for trouble cytopenia with platelet count of 93,000, serum creatinine 1.1, calcium 8.5 ?Chest x-ray reviewed by me shows no radiographic evidence of acute cardiopulmonary disease. ?Cardiomegaly. Aortic atherosclerosis. ?Twelve-lead EKG reviewed by me shows sinus bradycardia with left axis deviation and low voltage QRS ?There are no new results to review at this time. ? ?Assessment and Plan: ?* Chest pain ?Patient with a known history of coronary artery disease status post stent angioplasty who presents for evaluation of sudden onset left-sided chest pain that woke her up from sleep. ?Pain was relieved by nitroglycerin ?2 sets of troponin are negative ?Continue aspirin, Plavix, carvedilol and statins ?We will request cardiology consult ? ? ?Thrombocytopenia ([place]) ?Appears chronic ?No evidence of bleeding ?Monitor closely during this hospitalization ? ?Depression ?Stable ?Continue venlafaxine ? ?Current tobacco use ?Smoking cessation has been discussed with patient in detail ?She states that she has cut back on the number of cigarettes that she smokes ?We will place patient on a nicotine transdermal patch 7 mg daily ? ?Breast cancer of upper-inner quadrant of right female breast  ([place]) ?Stable ?Continue tamoxifen ? ?FAILURE, SYSTOLIC HEART, CHRONIC ?Stable ?Not acutely exacerbated ?Continue carvedilol with holding parameters due to bradycardia and Avapro ?Hold furosemide for now since patient does

## 2021-09-28 NOTE — Assessment & Plan Note (Addendum)
HFr EF / Cardiomyopathy /Abandoned AICD ?Appears compensated.  Patient does not appear fluid overloaded. ?Patient has had longstanding issue of noncompliance and not following up. ?Her AICD has been abandoned for years. ?Continue carvedilol with holding parameters due to bradycardia and Avapro ?Hold furosemide for now.  Patient does not appear volume overloaded. ?2D Echo showed LVEF 30-35%. ? ?

## 2021-09-28 NOTE — Progress Notes (Signed)
Patient stated that "bad things have been happening to me at my apartment"  When I asked for more information patient would not say any more.  MD notified ? ?

## 2021-09-29 DIAGNOSIS — F331 Major depressive disorder, recurrent, moderate: Secondary | ICD-10-CM

## 2021-09-29 DIAGNOSIS — R072 Precordial pain: Secondary | ICD-10-CM | POA: Diagnosis not present

## 2021-09-29 LAB — VITAMIN B12: Vitamin B-12: 257 pg/mL (ref 180–914)

## 2021-09-29 LAB — HIV ANTIBODY (ROUTINE TESTING W REFLEX): HIV Screen 4th Generation wRfx: NONREACTIVE

## 2021-09-29 LAB — RPR: RPR Ser Ql: NONREACTIVE

## 2021-09-29 NOTE — Consult Note (Signed)
[facility] Face-to-Face Psychiatry Consult  ? ?Reason for Consult:  Capacity, depression ?Referring Physician:  Dr Dwyane Dee ?Patient Identification: Christine [name] ?MRN:  [id number] ?Principal Diagnosis: Chest pain ?Diagnosis:  Principal Problem: ?  Chest pain ?Active Problems: ?  Essential hypertension ?  FAILURE, SYSTOLIC HEART, CHRONIC ?  Breast cancer of upper-inner quadrant of right female breast ([place]) ?  Thrombocytopenia ([place]) ? ? ?Total Time spent with patient: 45 minutes ? ?Subjective:   ?Christine [name] is a 76 y.o. female patient admitted with chest pains. ? ?HPI:  76 yo female who presented to the ED with chest pain, consult for capacity and depression.  She is alert on assessment with the sitter at her bedside who reports she has not had any issues with the client.  The client knows where she is at, month, and day.  She reports, "I'm not feeling good".  She denies depression appears dysthymic but she also does not feel well.  She reports she would call her doctor or a neighbor if she became sick or "911".  She denies suicidal/homicidal ideations, hallucinations, and substance use.  Christine [name] reports she has a number to call to get groceries and rides.  She has a son but does not know where he is and according to notes, they are estranged.  Some confusion at times noted like what brought you to the hospital, she responded, "I try to show pride for our children."  She does appear hard of hearing during the assessment with increase in volume needed along with repetition of questions.  Capacity is time and date specific in the hospital with a different environment with difficulty obtaining a global capacity.  At this time the client appears to know what to do at home to care for herself however she appears fatigued.  Hopefully her sister when she visits can lend some insight into her current state.  She does not want to go to a nursing facility or rehab, she wants to return to her apartment. ? ?Past Psychiatric History:  depression, anxiety ? ?Risk to Self:  none ?Risk to Others:  none ?Prior Inpatient Therapy:  none ?Prior Outpatient Therapy:  none ? ?Past Medical History:  ?Past Medical History:  ?Diagnosis Date  ? Allergic rhinitis   ? Anxiety   ? Breast cancer ([place])   ? 11/26/20 Pt reports wound/lesion on left breast  ? Chronic systolic heart failure ([place])   ? Coronary artery disease   ? GERD (gastroesophageal reflux disease)   ? Heart murmur   ? Hyperlipidemia   ? Hypertension   ? Implantable defibrillator St Jude   ? DOI 2006  ? MI (myocardial infarction) ([place])   ? Osteoarthritis   ? Wears dentures   ? full upper and lower  ?  ?Past Surgical History:  ?Procedure Laterality Date  ? CHF exacerbation  10/07  ? CORONARY STENT PLACEMENT  8/06  ? 3 stents; myocardial infarction  ? LOWER EXTREMITY ANGIOGRAPHY Left 02/15/2019  ? Procedure: LOWER EXTREMITY ANGIOGRAPHY;  Surgeon: Katha Cabal, MD;  Location: [facility];  Service: Cardiovascular;  Laterality: Left;  ? NM MYOVIEW LTD  1/08  ? EF 39%, no sx ischemia  ? PACEMAKER PLACEMENT  2/07  ? Defibrillator  ? RENAL ARTERY STENT Left   ? ?Family History:  ?Family History  ?Problem Relation Age of Onset  ? Stroke Mother 5  ? Heart attack Father   ? Coronary artery disease Father   ? Heart attack Brother   ?  Coronary artery disease Other   ? Hypertension Other   ? Cancer Neg Hx   ?     No breast or colon cancer  ? ?Family Psychiatric  History: see above ?Social History:  ?Social History  ? ?Substance and Sexual Activity  ?Alcohol Use No  ? Comment: 2-3 times per month  ?   ?Social History  ? ?Substance and Sexual Activity  ?Drug Use No  ?  ?Social History  ? ?Socioeconomic History  ? Marital status: Divorced  ?  Spouse name: Not on file  ? Number of children: 1  ? Years of education: Not on file  ? Highest education level: Not on file  ?Occupational History  ? Occupation: Artist  ?  Comment: Disabled  ?Tobacco Use  ? Smoking status: Every Day  ?  Packs/day: 0.50  ?   Years: 12.00  ?  Pack years: 6.00  ?  Types: Cigarettes  ? Smokeless tobacco: Never  ? Tobacco comments:  ?     ?Vaping Use  ? Vaping Use: Never used  ?Substance and Sexual Activity  ? Alcohol use: No  ?  Comment: 2-3 times per month  ? Drug use: No  ? Sexual activity: Not on file  ?Other Topics Concern  ? Not on file  ?Social History Narrative  ? Pt is disabled but is hoping to back to work part time.  ? Has been divorced twice.  ? Gets regular exercise.  ? ?Social Determinants of Health  ? ?Financial Resource Strain: Not on file  ?Food Insecurity: Not on file  ?Transportation Needs: Not on file  ?Physical Activity: Not on file  ?Stress: Not on file  ?Social Connections: Not on file  ? ?Additional Social History: ?  ? ?Allergies:   ?Allergies  ?Allergen Reactions  ? Atorvastatin Other (See Comments)  ?  increased LFT's  ? Lisinopril Cough  ? Oxycodone Hcl Itching and Other (See Comments)  ?  Restless & jittery  ? Simvastatin Other (See Comments)  ?  myalgias  ? ? ?Labs:  ?Results for orders placed or performed during the hospital encounter of 09/28/21 (from the past 48 hour(s))  ?Basic metabolic panel     Status: Abnormal  ? Collection Time: 09/28/21  5:17 AM  ?Result Value Ref Range  ? Sodium 138 135 - 145 mmol/L  ? Potassium 4.1 3.5 - 5.1 mmol/L  ? Chloride 107 98 - 111 mmol/L  ? CO2 24 22 - 32 mmol/L  ? Glucose, Bld 94 70 - 99 mg/dL  ?  Comment: Glucose reference range applies only to samples taken after fasting for at least 8 hours.  ? BUN 16 8 - 23 mg/dL  ? Creatinine, Ser 1.11 (H) 0.44 - 1.00 mg/dL  ? Calcium 8.5 (L) 8.9 - 10.3 mg/dL  ? GFR, Estimated 52 (L) >60 mL/min  ?  Comment: (NOTE) ?Calculated using the CKD-EPI Creatinine Equation (2021) ?  ? Anion gap 7 5 - 15  ?  Comment: Performed at [facility], [address], [place], [place] [address]  ?CBC     Status: Abnormal  ? Collection Time: 09/28/21  5:17 AM  ?Result Value Ref Range  ? WBC 7.7 4.0 - 10.5 K/uL  ? RBC 3.98 3.87 - 5.11 MIL/uL  ?  Hemoglobin 12.0 12.0 - 15.0 g/dL  ? HCT 36.6 36.0 - 46.0 %  ? MCV 92.0 80.0 - 100.0 fL  ? MCH 30.2 26.0 - 34.0 pg  ? MCHC  32.8 30.0 - 36.0 g/dL  ? RDW 14.3 11.5 - 15.5 %  ? Platelets 93 (L) 150 - 400 K/uL  ?  Comment: Immature Platelet Fraction may be ?clinically indicated, consider ?ordering this additional test ?[id number] ?  ? nRBC 0.0 0.0 - 0.2 %  ?  Comment: Performed at [facility], [address], [place], [place] [address]  ?Troponin I (High Sensitivity)     Status: None  ? Collection Time: 09/28/21  5:17 AM  ?Result Value Ref Range  ? Troponin I (High Sensitivity) 12 <18 ng/L  ?  Comment: (NOTE) ?Elevated high sensitivity troponin I (hsTnI) values and significant  ?changes across serial measurements may suggest ACS but many other  ?chronic and acute conditions are known to elevate hsTnI results.  ?Refer to the "Links" section for chest pain algorithms and additional  ?guidance. ?Performed at [facility], [place], ?[place] [address] ?  ?Troponin I (High Sensitivity)     Status: None  ? Collection Time: 09/28/21  7:49 AM  ?Result Value Ref Range  ? Troponin I (High Sensitivity) 11 <18 ng/L  ?  Comment: (NOTE) ?Elevated high sensitivity troponin I (hsTnI) values and significant  ?changes across serial measurements may suggest ACS but many other  ?chronic and acute conditions are known to elevate hsTnI results.  ?Refer to the "Links" section for chest pain algorithms and additional  ?guidance. ?Performed at [facility], [place], ?[place] [address] ?  ?Urinalysis, Complete w Microscopic Urine, Clean Catch     Status: Abnormal  ? Collection Time: 09/28/21  4:00 PM  ?Result Value Ref Range  ? Color, Urine YELLOW (A) YELLOW  ? APPearance CLEAR (A) CLEAR  ? Specific Gravity, Urine 1.009 1.005 - 1.030  ? pH 7.0 5.0 - 8.0  ? Glucose, UA NEGATIVE NEGATIVE mg/dL  ? Hgb urine dipstick SMALL (A) NEGATIVE  ? Bilirubin Urine NEGATIVE NEGATIVE  ? Ketones, ur  NEGATIVE NEGATIVE mg/dL  ? Protein, ur NEGATIVE NEGATIVE mg/dL  ? Nitrite NEGATIVE NEGATIVE  ? Leukocytes,Ua NEGATIVE NEGATIVE  ? RBC / HPF 0-5 0 - 5 RBC/hpf  ? WBC, UA 6-10 0 - 5 WBC/hpf  ? Bacteria, UA

## 2021-09-29 NOTE — Hospital Course (Addendum)
This 76 years old female with PMH significant for coronary artery disease s/p stent/angioplasty s/p AICD placement, chronic systolic heart failure, history of breast cancer and anxiety presented in the ED for the evaluation of chest pain which woke her up early in the morning.  Patient describes chest pain as crushing and severe heavy weight on her left chest.  Patient denies any shortness of breath nausea vomiting or diaphoresis or palpitations.  Symptom lasted for 1-1/2-hour and was given sublingual nitro which resolved the chest pain. ?Patient is admitted for chest pain to rule out ACS.  EKG and troponin unremarkable.  Cardiology was consulted recommended no intervention needed at this time.  Patient needs to follow-up with cardiology outpatient.  Patient seems confused, forgetfulness, TOC is consulted for evaluation for safe disposition home. ?

## 2021-09-29 NOTE — Progress Notes (Signed)
?Progress Note ? ? ?Patient: Christine [name] WCH:[id number] DOB: Jan 12, 1946 DOA: 09/28/2021     0 ?DOS: the patient was seen and examined on 09/29/2021 ?  ?Brief hospital course: ?This 76 years old female with PMH significant for coronary artery disease s/p stent/angioplasty s/p AICD placement, chronic systolic heart failure, history of breast cancer and anxiety presented in the ED for the evaluation of chest pain which woke her up early in the morning.  Patient describes chest pain as crushing and severe heavy weight on her left chest.  Patient denies any shortness of breath nausea vomiting or diaphoresis or palpitations.  Symptom lasted for 1-1/2-hour and was given sublingual nitro which resolved the chest pain. ?Patient is admitted for chest pain to rule out ACS.  EKG and troponin unremarkable.  Cardiology is consulted recommended no intervention needed at this time.  Patient needs to follow-up with cardiology outpatient.  Patient seems confused, forgetfulness, TOC is consulted for evaluation for safe disposition home. ? ?Assessment and Plan: ?* Chest pain ?Patient with known history of coronary artery disease status post stent/angioplasty. ?She presented with a sudden onset of left-sided chest pain that woke her up from sleep. ?Chest pain resolved with nitroglycerin concerning for ACS. ?EKG unremarkable, troponins negative. ?Continue aspirin, Plavix, carvedilol, statins. ?Cardiology is consulted, started on Imdur 30 mg daily. ?Patient does not need any ischemic evaluation at this time. ?Cardiology recommended outpatient follow-up. ? ? ?Thrombocytopenia ([place]) ?Appears chronic.  No evidence of bleeding ?Monitor closely during this hospitalization ? ?Depression ?Continue venlafaxine. ?Patient patient seems to have dementia, confusion and forgetfulness. ?Psych consulted for evaluation. ? ?Current tobacco use ?Smoking cessation has been discussed with patient in detail ?She states that she has cut back on the number of  cigarettes that she smokes ?We will place patient on a nicotine transdermal patch 7 mg daily ? ?Breast cancer of upper-inner quadrant of right female breast ([place]) ?Continue tamoxifen. ? ?FAILURE, SYSTOLIC HEART, CHRONIC ?HFr EF /cardiomyopathy /Abandoned AICD ?Appears compensated.  Patient does not appear fluid overloaded. ?Patient has had longstanding issue of noncompliance and not following up. ?Her AICD has been abandoned  for years. ?Continue carvedilol with holding parameters due to bradycardia and Avapro ?Hold furosemide for now.  Patient does not appear volume overloaded. ?Obtain 2D echocardiogram. ? ? ?Essential hypertension ?Continue Avapro and carvedilol ? ? ? ?Subjective: Patient was seen and examined at bedside.  Overnight events noted. ?Patient denies any chest pain, shortness of breath, dizziness.  She has not followed up with any doctor in more than a year.  Patient lives alone and appears confused. ? ?Physical Exam: ?Vitals:  ? 09/28/21 2312 09/29/21 0300 09/29/21 0706 09/29/21 0933  ?BP: (!) 100/55 108/62 127/72 (!) 107/48  ?Pulse: (!) 54 (!) 56 63 (!) 56  ?Resp: '16  18 16  '$ ?Temp: 97.9 ?F (36.6 ?C) 98.3 ?F (36.8 ?C) 98.3 ?F (36.8 ?C) 98 ?F (36.7 ?C)  ?TempSrc: Oral Oral    ?SpO2: 96% 96% 97% 97%  ?Weight:      ?Height:      ? ?General exam: Appears comfortable, deconditioned, not in any acute distress.  Frail ?Respiratory system: CTA bilaterally, no wheezing, no crackles, normal respiratory effort. ?Cardiovascular system: S1-S2 heard, regular rate and rhythm, no murmur. ?Gastrointestinal system: Abdominal soft, nontender, nondistended, BS+ ?Central nervous system: Alert, oriented x3, no focal neurological deficits. ?Extremities: No edema, no cyanosis, no clubbing. ?Psychiatry: Mood, insight, judgment normal ? ?Data Reviewed: ?I have Reviewed nursing notes, Vitals, and Lab results since  pt's last encounter. Pertinent lab results CBC, CMP, troponins, BNP ?I have ordered test including CBC, BMP ?I have  independently visualized and interpreted imaging CT head which showed no acute intracranial process. ?I have reviewed the last note from cardiologist,  ?I have discussed pt's care plan and test results with patient.  ? ?Family Communication: No family at bedside ? ?Disposition: ?Status is: Observation ?The patient remains OBS appropriate and will d/c before 2 midnights. ? ?Patient was admitted for chest pain to rule out ACS.  Chest pain has resolved.  Patient lives alone, seems confused and forgetfulness.  TOC consulted for evaluation of safe disposition. ? ? Planned Discharge Destination: Home ? ? ? ? ?Time spent: 50 minutes ? ?Author: ?Shawna Clamp, MD ?09/29/2021 11:20 AM ? ?For on call review [email or web address].  ?

## 2021-09-29 NOTE — Progress Notes (Signed)
Contacted by sitter that patient is agitated and insisting on leaving.  This RN tried to redirect patient but she was very agitated and confused.  PRN given.  Patient currently calm in bed.  Will continue to monitor ?

## 2021-09-29 NOTE — TOC Progression Note (Signed)
Transition of Care (TOC) - Progression Note  ? ? ?Patient Details  ?Name: Christine [name] ?MRN: [id number] ?Date of Birth: August 03, 1945 ? ?Transition of Care (TOC) CM/SW Contact  ?Raina Mina, LCSWA ?Phone Number: ?09/29/2021, 12:05 PM ? ?Clinical Narrative:  Patient unable to discharge today due to being unsafe at home. CSW contacted patients niece Tammy to see if she can assist with a safe discharge plan for patient. Tammy stated that she will call her sister and they will either come to the hospital today or tomorrow morning. Tammy stated she believes her aunt has dementia. Tammy stated when she talks with her aunt its like talking in circles. Tammy stated patient does have a son but he does not speak with patient or keep in touch with her. CSW informed patients niece that psychiatry will come and complete an evaluation.  ? ? ? ?  ?Barriers to Discharge: Continued Medical Work up ? ?Expected Discharge Plan and Services ?  ?  ?  ?  ?  ?Expected Discharge Date: 09/29/21               ?  ?  ?  ?  ?  ?  ?  ?  ?  ?  ? ? ?Social Determinants of Health (SDOH) Interventions ?  ? ?Readmission Risk Interventions ?   ? View : No data to display.  ?  ?  ?  ? ? ?

## 2021-09-29 NOTE — Consult Note (Signed)
[facility] ? ?CARDIOLOGY CONSULT NOTE  ?Patient ID: ?Christine [name] ?MRN: [id number] ?DOB/AGE: [birth year] 75 y.o. ? ?Admit date: 09/28/2021 ?Referring Physician agbata ?Primary Physician Baxter Hire, MD ?Primary Cardiologist Virl Axe ?Reason for Consultation chest pain, negative troponin ? ?HPI:  ?Christine [name] is a 76 year old female with history of HFrEF (EF 30 to 35%) with an abandoned ICD for several years, CAD s/p multiple stents 2006, PAD s/p renal artery stent 02/2019, breast cancer s/p mastectomy with metastases to the lungs, hypertension, hyperlipidemia presents with chest pain and negative troponin.  Cardiology is consulted for further assistance. ? ?Interval history: ?- Progressive confusion yesterday.  ?- No chest pain or shortness of breath.  ? ? ?Review of systems complete and found to be negative unless listed above  ? ? ? ?Past Medical History:  ?Diagnosis Date  ? Allergic rhinitis   ? Anxiety   ? Breast cancer ([place])   ? 11/26/20 Pt reports wound/lesion on left breast  ? Chronic systolic heart failure ([place])   ? Coronary artery disease   ? GERD (gastroesophageal reflux disease)   ? Heart murmur   ? Hyperlipidemia   ? Hypertension   ? Implantable defibrillator [employer] Jude   ? DOI 2006  ? MI (myocardial infarction) ([place])   ? Osteoarthritis   ? Wears dentures   ? full upper and lower  ?  ?Past Surgical History:  ?Procedure Laterality Date  ? CHF exacerbation  10/07  ? CORONARY STENT PLACEMENT  8/06  ? 3 stents; myocardial infarction  ? LOWER EXTREMITY ANGIOGRAPHY Left 02/15/2019  ? Procedure: LOWER EXTREMITY ANGIOGRAPHY;  Surgeon: Katha Cabal, MD;  Location: [facility];  Service: Cardiovascular;  Laterality: Left;  ? NM MYOVIEW LTD  1/08  ? EF 39%, no sx ischemia  ? PACEMAKER PLACEMENT  2/07  ? Defibrillator  ? RENAL ARTERY STENT Left   ?  ?Medications Prior to Admission  ?Medication Sig Dispense Refill Last Dose  ? aspirin EC 81 MG tablet Take 81 mg by mouth daily.   Past Week  ?  bisacodyl (DULCOLAX) 5 MG EC tablet Take 5 mg by mouth daily as needed for mild constipation or moderate constipation.   Past Month  ? carvedilol (COREG) 25 MG tablet TAKE 1 TABLET BY MOUTH TWICE A DAY 60 tablet 2 09/27/2021  ? clopidogrel (PLAVIX) 75 MG tablet TAKE 1 TABLET BY MOUTH ONCE DAILY 30 tablet 4 09/27/2021  ? hydrOXYzine (ATARAX) 25 MG tablet Take 25 mg by mouth 3 (three) times daily.   09/27/2021  ? LORazepam (ATIVAN) 0.5 MG tablet Take 0.25 mg by mouth 2 (two) times daily.    Past Month  ? pantoprazole (PROTONIX) 40 MG tablet Take 40 mg by mouth daily.    09/27/2021  ? Polyethylene Glycol 3350 (PEG 3350) POWD Take 17 g by mouth daily as needed (constipation.).    Past Month  ? rosuvastatin (CRESTOR) 10 MG tablet Take 10 mg by mouth daily.    09/27/2021  ? tamoxifen (NOLVADEX) 20 MG tablet Take 1 tablet (20 mg total) by mouth daily. 90 tablet 3 09/27/2021  ? traMADol (ULTRAM) 50 MG tablet Take 25 mg by mouth 2 (two) times daily.   09/27/2021  ? valsartan (DIOVAN) 320 MG tablet Take 320 mg by mouth daily at 12 noon.    09/27/2021  ? albuterol (VENTOLIN HFA) 108 (90 Base) MCG/ACT inhaler Inhale 2 puffs into the lungs 4 (four) times daily as needed (wheezing/shortness of breath). (  Patient not taking: Reported on 09/28/2021)   Not Taking  ? furosemide (LASIX) 40 MG tablet Take 40 mg by mouth daily as needed (fluid retention/swelling). (Patient not taking: Reported on 09/28/2021)   Not Taking  ? tetrahydrozoline 0.05 % ophthalmic solution Place 1 drop into both eyes 3 (three) times daily as needed (dry/irritated eyes). (Patient not taking: Reported on 09/28/2021)   Not Taking  ? venlafaxine XR (EFFEXOR-XR) 37.5 MG 24 hr capsule Take 37.5 mg by mouth daily. (Patient not taking: Reported on 09/28/2021)   Not Taking  ? ?Social History  ? ?Socioeconomic History  ? Marital status: Divorced  ?  Spouse name: Not on file  ? Number of children: 1  ? Years of education: Not on file  ? Highest education level: Not on file   ?Occupational History  ? Occupation: Artist  ?  Comment: Disabled  ?Tobacco Use  ? Smoking status: Every Day  ?  Packs/day: 0.50  ?  Years: 12.00  ?  Pack years: 6.00  ?  Types: Cigarettes  ? Smokeless tobacco: Never  ? Tobacco comments:  ?     ?Vaping Use  ? Vaping Use: Never used  ?Substance and Sexual Activity  ? Alcohol use: No  ?  Comment: 2-3 times per month  ? Drug use: No  ? Sexual activity: Not on file  ?Other Topics Concern  ? Not on file  ?Social History Narrative  ? Pt is disabled but is hoping to back to work part time.  ? Has been divorced twice.  ? Gets regular exercise.  ? ?Social Determinants of Health  ? ?Financial Resource Strain: Not on file  ?Food Insecurity: Not on file  ?Transportation Needs: Not on file  ?Physical Activity: Not on file  ?Stress: Not on file  ?Social Connections: Not on file  ?Intimate Partner Violence: Not on file  ?  ?Family History  ?Problem Relation Age of Onset  ? Stroke Mother 68  ? Heart attack Father   ? Coronary artery disease Father   ? Heart attack Brother   ? Coronary artery disease Other   ? Hypertension Other   ? Cancer Neg Hx   ?     No breast or colon cancer  ?  ? ? ?Review of systems complete and found to be negative unless listed above  ? ? ? ? ?PHYSICAL EXAM ?Vitals:  ? 09/29/21 0300 09/29/21 0706  ?BP: 108/62 127/72  ?Pulse: (!) 56 63  ?Resp:  18  ?Temp: 98.3 ?F (36.8 ?C) 98.3 ?F (36.8 ?C)  ?SpO2: 96% 97%  ? ? ? ? ?General: cacechtic appearing.  ?HEENT:  Normocephalic and atramatic ?Neck:  No JVD.  ?Lungs: Clear bilaterally to auscultation and percussion. ?Heart: HRRR . Normal S1 and S2 without gallops or murmurs.  ?Abdomen: Bowel sounds are positive, abdomen soft and non-tender  ?Msk:  Back normal, normal gait. Normal strength and tone for age. Open wound on left chest wall. Cancerous appearing lesion on clavicle. ICD on left chest. ?Extremities: No clubbing, cyanosis or edema.   ?Neuro: Alert and oriented X 3. ?Psych:  Good affect, responds  appropriately ? ?Labs: ?  ?Lab Results  ?Component Value Date  ? WBC 7.7 09/28/2021  ? HGB 12.0 09/28/2021  ? HCT 36.6 09/28/2021  ? MCV 92.0 09/28/2021  ? PLT 93 (L) 09/28/2021  ?  ?Recent Labs  ?Lab 09/28/21 ?2993  ?NA 138  ?K 4.1  ?CL 107  ?CO2 24  ?BUN 16  ?  CREATININE 1.11*  ?CALCIUM 8.5*  ?GLUCOSE 94  ? ? ?Lab Results  ?Component Value Date  ? TROPONINI <0.03 12/20/2014  ? ?  ?Lab Results  ?Component Value Date  ? CHOL 105 08/31/2011  ? CHOL 194 06/04/2009  ? CHOL 185 11/28/2008  ? ?Lab Results  ?Component Value Date  ? HDL 26 (L) 08/31/2011  ? HDL 38.20 (L) 06/04/2009  ? HDL 40.90 11/28/2008  ? ?Lab Results  ?Component Value Date  ? [place] 58 08/31/2011  ? LDLCALC 113 (H) 11/28/2008  ? LDLCALC 116 (H) 02/02/2008  ? ?Lab Results  ?Component Value Date  ? TRIG 105 08/31/2011  ? TRIG 218.0 (H) 06/04/2009  ? TRIG 158.0 (H) 11/28/2008  ? ?Lab Results  ?Component Value Date  ? CHOLHDL 5 06/04/2009  ? CHOLHDL 5 11/28/2008  ? CHOLHDL 4.4 CALC 02/02/2008  ? ?Lab Results  ?Component Value Date  ? LDLDIRECT 126.0 06/04/2009  ? LDLDIRECT 118.9 11/16/2006  ? LDLDIRECT 117.1 08/25/2006  ?  ?  ?Radiology: CT HEAD WO CONTRAST (5MM) ? ?Result Date: 09/28/2021 ?CLINICAL DATA:  Altered mental status EXAM: CT HEAD WITHOUT CONTRAST TECHNIQUE: Contiguous axial images were obtained from the base of the skull through the vertex without intravenous contrast. RADIATION DOSE REDUCTION: This exam was performed according to the departmental dose-optimization program which includes automated exposure control, adjustment of the mA and/or kV according to patient size and/or use of iterative reconstruction technique. COMPARISON:  CT head 02/14/2005 FINDINGS: Brain: No acute intracranial hemorrhage, mass effect, or herniation. No extra-axial fluid collections. No evidence of acute territorial infarct. No hydrocephalus. Moderate cortical volume loss. Patchy hypodensities in the periventricular and subcortical white matter, likely secondary to  chronic microvascular ischemic changes. Vascular: Calcified plaques in the carotid siphons. Skull: Normal. Negative for fracture or focal lesion. Sinuses/Orbits: No acute finding. Other: None. IMPRESSION: Chronic change

## 2021-09-30 DIAGNOSIS — R072 Precordial pain: Secondary | ICD-10-CM | POA: Diagnosis not present

## 2021-09-30 NOTE — TOC Progression Note (Addendum)
Transition of Care (TOC) - Progression Note  ? ? ?Patient Details  ?Name: Christine [name] ?MRN: [id number] ?Date of Birth: 24-Mar-1946 ? ?Transition of Care (TOC) CM/SW Contact  ?Alberteen Sam, LCSW ?Phone Number: ?09/30/2021, 9:22 AM ? ?Clinical Narrative:    ? ?CSW notes per psych consult assessment, patient noted to have capacity at this time.  ? ?Patient declined rehab.    ? ?Patient does not meet criteria for psychiatric inpatient admission. ? ?Current dispo based on above would be patient to discharge home when cleared per patient's wishes.   ?  ? ?  ?Barriers to Discharge: Continued Medical Work up ? ?Expected Discharge Plan and Services ?  ?  ?  ?  ?  ?Expected Discharge Date: 09/29/21               ?  ?  ?  ?  ?  ?  ?  ?  ?  ?  ? ? ?Social Determinants of Health (SDOH) Interventions ?  ? ?Readmission Risk Interventions ?   ? View : No data to display.  ?  ?  ?  ? ? ?

## 2021-09-30 NOTE — Progress Notes (Signed)
?Progress Note ? ? ?Patient: Christine [name] QZE:[id number] DOB: [birth year] DOA: 09/28/2021     0 ? ?DOS: the patient was seen and examined on 09/30/2021 ?  ?Brief hospital course: ?This 76 years old female with PMH significant for coronary artery disease s/p stent/angioplasty s/p AICD placement, chronic systolic heart failure, history of breast cancer and anxiety presented in the ED for the evaluation of chest pain which woke her up early in the morning.  Patient describes chest pain as crushing and severe heavy weight on her left chest.  Patient denies any shortness of breath nausea vomiting or diaphoresis or palpitations.  Symptom lasted for 1-1/2-hour and was given sublingual nitro which resolved the chest pain. ?Patient is admitted for chest pain to rule out ACS.  EKG and troponin unremarkable.  Cardiology is consulted recommended no intervention needed at this time.  Patient needs to follow-up with cardiology outpatient.  Patient seems confused, forgetfulness, TOC is consulted for evaluation for safe disposition home. ? ?Assessment and Plan: ?* Chest pain ?Patient with known history of coronary artery disease status post stent/angioplasty. ?She presented with a sudden onset of left-sided chest pain that woke her up from sleep. ?Chest pain resolved with nitroglycerin,  concerning for ACS. ?EKG unremarkable, troponins negative. ?Continue aspirin, Plavix, carvedilol, statins. ?Cardiology is consulted, started on Imdur 30 mg daily. ?Patient does not need any ischemic evaluation at this time. ?Cardiology recommended outpatient follow-up. ? ? ?Thrombocytopenia ([place]) ?Appears chronic.  No evidence of bleeding ?Monitor closely during this hospitalization ? ?Depression ?Continue venlafaxine. ?Patient patient seems to have dementia, confusion and forgetfulness. ?Psych consulted for evaluation. ? ?Breast cancer of upper-inner quadrant of right female breast ([place]) ?Continue tamoxifen. ? ?FAILURE, SYSTOLIC HEART, CHRONIC ?HFr  EF /Cardiomyopathy /Abandoned AICD ?Appears compensated.  Patient does not appear fluid overloaded. ?Patient has had longstanding issue of noncompliance and not following up. ?Her AICD has been abandoned  for years. ?Continue carvedilol with holding parameters due to bradycardia and Avapro ?Hold furosemide for now.  Patient does not appear volume overloaded. ?2D Echo showed LVEF 30-35%. ? ? ?Essential hypertension ?Continue Avapro and carvedilol ? ?Current tobacco use-resolved as of 09/29/2021 ?Smoking cessation has been discussed with patient in detail ?She states that she has cut back on the number of cigarettes that she smokes ?We will place patient on a nicotine transdermal patch 7 mg daily ? ? ? ?Subjective: Patient was seen and examined at bedside.  Overnight events noted. ?Patient denies any chest pain, shortness of breath, dizziness.  Patient does have intermittent forgetfulness.  Psychiatry consulted, Patient does have a capacity to make decisions. ? ?Physical Exam: ?Vitals:  ? 09/30/21 0000 09/30/21 0316 09/30/21 0800 09/30/21 1219  ?BP:  126/73 121/76 111/70  ?Pulse:  (!) 59 60 62  ?Resp: '14 20 18 17  '$ ?Temp:  99.2 ?F (37.3 ?C) 99 ?F (37.2 ?C) 98.9 ?F (37.2 ?C)  ?TempSrc:   Oral Oral  ?SpO2:  99% 98% 99%  ?Weight:      ?Height:      ? ?General exam: Appears frail, chronically ill looking, deconditioned, not in any distress ?Respiratory system: CTA bilaterally, no wheezing, no crackles, normal respiratory effort. ?Cardiovascular system: S1-S2 heard, regular rate and rhythm, no murmur. ?Gastrointestinal system: Abdomen is soft, nontender, nondistended, BS+ ?Central nervous system: Alert, oriented x 3, no focal neurological deficits. ?Extremities: No edema, no cyanosis, no clubbing. ?Psychiatry: Mood, insight, judgment normal ? ?Data Reviewed: ?I have Reviewed nursing notes, Vitals, and Lab results since pt's last  encounter. Pertinent lab results CBC, BMP ?I have ordered test including CBC, BMP ?I have  reviewed the last note from cardiology/psychiatry,  ?I have discussed pt's care plan and test results with patient.  ? ?Family Communication: No family at bedside ? ?Disposition: ?Status is: Observation ?The patient remains OBS appropriate and will d/c before 2 midnights. ?  ?Patient was admitted for chest pain to rule out ACS.  Chest pain has resolved.  Patient lives alone, seems confused and forgetfulness.  TOC consulted for evaluation of safe disposition. ? ? Planned Discharge Destination: Home ? ? ? ? ?Time spent: 35 minutes ? ?Author: ?Shawna Clamp, MD ?09/30/2021 1:27 PM ? ?For on call review [email or web address].  ?

## 2021-10-01 DIAGNOSIS — R072 Precordial pain: Secondary | ICD-10-CM | POA: Diagnosis not present

## 2021-10-01 DIAGNOSIS — Z853 Personal history of malignant neoplasm of breast: Secondary | ICD-10-CM | POA: Diagnosis not present

## 2021-10-01 DIAGNOSIS — Z91199 Patient's noncompliance with other medical treatment and regimen due to unspecified reason: Secondary | ICD-10-CM | POA: Diagnosis not present

## 2021-10-01 DIAGNOSIS — Z95810 Presence of automatic (implantable) cardiac defibrillator: Secondary | ICD-10-CM | POA: Diagnosis not present

## 2021-10-01 DIAGNOSIS — Z79810 Long term (current) use of selective estrogen receptor modulators (SERMs): Secondary | ICD-10-CM | POA: Diagnosis not present

## 2021-10-01 DIAGNOSIS — Z955 Presence of coronary angioplasty implant and graft: Secondary | ICD-10-CM | POA: Diagnosis not present

## 2021-10-01 DIAGNOSIS — Z9011 Acquired absence of right breast and nipple: Secondary | ICD-10-CM | POA: Diagnosis not present

## 2021-10-01 DIAGNOSIS — Z885 Allergy status to narcotic agent status: Secondary | ICD-10-CM | POA: Diagnosis not present

## 2021-10-01 DIAGNOSIS — R4189 Other symptoms and signs involving cognitive functions and awareness: Secondary | ICD-10-CM

## 2021-10-01 DIAGNOSIS — Z7982 Long term (current) use of aspirin: Secondary | ICD-10-CM | POA: Diagnosis not present

## 2021-10-01 DIAGNOSIS — Z7902 Long term (current) use of antithrombotics/antiplatelets: Secondary | ICD-10-CM | POA: Diagnosis not present

## 2021-10-01 DIAGNOSIS — I429 Cardiomyopathy, unspecified: Secondary | ICD-10-CM | POA: Diagnosis present

## 2021-10-01 DIAGNOSIS — F419 Anxiety disorder, unspecified: Secondary | ICD-10-CM | POA: Diagnosis present

## 2021-10-01 DIAGNOSIS — Z888 Allergy status to other drugs, medicaments and biological substances status: Secondary | ICD-10-CM | POA: Diagnosis not present

## 2021-10-01 DIAGNOSIS — Z823 Family history of stroke: Secondary | ICD-10-CM | POA: Diagnosis not present

## 2021-10-01 DIAGNOSIS — M1990 Unspecified osteoarthritis, unspecified site: Secondary | ICD-10-CM | POA: Diagnosis present

## 2021-10-01 DIAGNOSIS — F17210 Nicotine dependence, cigarettes, uncomplicated: Secondary | ICD-10-CM | POA: Diagnosis present

## 2021-10-01 DIAGNOSIS — E785 Hyperlipidemia, unspecified: Secondary | ICD-10-CM | POA: Diagnosis present

## 2021-10-01 DIAGNOSIS — I5022 Chronic systolic (congestive) heart failure: Secondary | ICD-10-CM | POA: Diagnosis present

## 2021-10-01 DIAGNOSIS — I25118 Atherosclerotic heart disease of native coronary artery with other forms of angina pectoris: Secondary | ICD-10-CM | POA: Diagnosis present

## 2021-10-01 DIAGNOSIS — R0789 Other chest pain: Secondary | ICD-10-CM | POA: Diagnosis present

## 2021-10-01 DIAGNOSIS — I252 Old myocardial infarction: Secondary | ICD-10-CM | POA: Diagnosis not present

## 2021-10-01 DIAGNOSIS — C7800 Secondary malignant neoplasm of unspecified lung: Secondary | ICD-10-CM | POA: Diagnosis present

## 2021-10-01 DIAGNOSIS — D696 Thrombocytopenia, unspecified: Secondary | ICD-10-CM | POA: Diagnosis present

## 2021-10-01 DIAGNOSIS — I110 Hypertensive heart disease with heart failure: Secondary | ICD-10-CM | POA: Diagnosis present

## 2021-10-01 DIAGNOSIS — Z8249 Family history of ischemic heart disease and other diseases of the circulatory system: Secondary | ICD-10-CM | POA: Diagnosis not present

## 2021-10-01 DIAGNOSIS — F0393 Unspecified dementia, unspecified severity, with mood disturbance: Secondary | ICD-10-CM | POA: Diagnosis present

## 2021-10-01 MED ORDER — MELATONIN 5 MG PO TABS
2.5000 mg | ORAL_TABLET | Freq: Every evening | ORAL | Status: DC | PRN
  Administered 2021-10-01 – 2021-10-02 (×2): 2.5 mg via ORAL
  Filled 2021-10-01 (×2): qty 1

## 2021-10-01 NOTE — Assessment & Plan Note (Signed)
Patient does have mild cognitive decline. ?She is fully awake, oriented x 3, answers appropriately. ?She does remains confused sometimes, lives by self. ?CT head unremarkable.  HIV, RPR, B12, vitamin D, TSH normal ?PT and OT recommended SNF ?

## 2021-10-01 NOTE — Progress Notes (Deleted)
Approached by sitter: states pt has become more confused and started to pull her leads off and talking to her purse... also reports pt has not slept at all.... states that the pt is not verbally or physically abusive.. ... notified Neomia Glass NP ?

## 2021-10-01 NOTE — Progress Notes (Addendum)
Approached by sitter: states pt has become more confused and started to pull her leads off and talking to her purse... also reports pt has not slept at all.... states that the pt is not verbally or physically abusive.. ... Notified Neomia Glass, NP ?

## 2021-10-01 NOTE — Evaluation (Signed)
Physical Therapy Evaluation ?Patient Details ?Name: Christine [name] ?MRN: [id number] ?DOB: March 24, 1946 ?Today's Date: 10/01/2021 ? ?History of Present Illness ? presented to ER secondary to acute onset of chest pain; troponins negative, EKG unremarkable. Recommended for outpatient cardiac follow up.  Hospital course complicated by AMS, intermittent agitation/restlessness.  ?Clinical Impression ? Patient resting in bed upon arrival to room; easily awakens to voice.  Oriented to self, location as [facility]; unaware of date.  Limited recall of new information, limited insight into deficits; poor ability to identify and problem-solve basic emergency procedures.  Bilat UE/LE generally weak and deconditioned, but grossly functional for basic transfers and gait.  Able to complete bed mobility with mod indep; sit/stand, basic transfers and gait (400') without assist device, close sup.  Demonstrates reciprocal stepping pattern with fair step height/length; fair cadence, gait speed; fair trunk rotation and arm swing.  Mild sway/deviation with head turns and dynamic gait components; self-corrects with LE step strategy. ?Would benefit from skilled PT to address above deficits and promote optimal return to PLOF.; will maintain on caseload at this time to address higher-level balance and safety deficits.  Recommend 24/7 supervision/assist for safety needs; if patient/family unable to provide in home environment, may consider transition to LTC. ?   ? ?Recommendations for follow up therapy are one component of a multi-disciplinary discharge planning process, led by the attending physician.  Recommendations may be updated based on patient status, additional functional criteria and insurance authorization. ? ?Follow Up Recommendations No PT follow up ? ?  ?Assistance Recommended at Discharge    ?Patient can return home with the following ? A little help with walking and/or transfers;A little help with bathing/dressing/bathroom;Direct  supervision/assist for medications management;Direct supervision/assist for financial management;Assist for transportation ? ?  ?Equipment Recommendations    ?Recommendations for Other Services ?    ?  ?Functional Status Assessment Patient has had a recent decline in their functional status and demonstrates the ability to make significant improvements in function in a reasonable and predictable amount of time.  ? ?  ?Precautions / Restrictions Precautions ?Precautions: Fall ?Restrictions ?Weight Bearing Restrictions: No  ? ?  ? ?Mobility ? Bed Mobility ?Overal bed mobility: Modified Independent ?  ?  ?  ?  ?  ?  ?  ?  ? ?Transfers ?Overall transfer level: Needs assistance ?Equipment used: None ?Transfers: Sit to/from Stand ?Sit to Stand: Supervision ?  ?  ?  ?  ?  ?General transfer comment: mild sway with initial stance, but self-corrects ?  ? ?Ambulation/Gait ?Ambulation/Gait assistance: Supervision ?Gait Distance (Feet): 400 Feet ?Assistive device: None ?  ?Gait velocity: 10' walk time, 8 seconds ?  ?  ?General Gait Details: reciprocal stepping pattern with fair step height/length; fair cadence, gait speed; fair trunk rotation and arm swing.  Mild sway/deviation with head turns and dynamic gait components; self-corrects with LE step strategy. ? ?Stairs ?  ?  ?  ?  ?  ? ?Wheelchair Mobility ?  ? ?Modified Rankin (Stroke Patients Only) ?  ? ?  ? ?Balance Overall balance assessment: Needs assistance ?Sitting-balance support: No upper extremity supported, Feet supported ?Sitting balance-Leahy Scale: Good ?  ?  ?Standing balance support: No upper extremity supported ?Standing balance-Leahy Scale: Fair ?  ?  ?  ?  ?  ?  ?  ?  ?  ?  ?  ?  ?   ? ? ? ?Pertinent Vitals/Pain Pain Assessment ?Pain Assessment: No/denies pain  ? ? ?Home Living Family/patient  expects to be discharged to:: Private residence ?Living Arrangements: Alone ?Available Help at Discharge: Family ?Type of Home: Apartment ?Home Access: Stairs to enter ?   ?Entrance Stairs-Number of Steps: 3-4 ?  ?Home Layout: One level ?Home Equipment: None ?   ?  ?Prior Function Prior Level of Function : Independent/Modified Independent ?  ?  ?  ?  ?  ?  ?Mobility Comments: Indep with ADLs, household and community mobilization without assist device; + driving; + cooking/cleaning ?  ?  ? ? ?Hand Dominance  ?   ? ?  ?Extremity/Trunk Assessment  ? Upper Extremity Assessment ?Upper Extremity Assessment: Overall WFL for tasks assessed ?  ? ?Lower Extremity Assessment ?Lower Extremity Assessment: Overall WFL for tasks assessed (grossly at least 4/5 throughout) ?  ? ?   ?Communication  ? Communication: No difficulties  ?Cognition Arousal/Alertness: Awake/alert ?Behavior During Therapy: [facility] for tasks assessed/performed ?Overall Cognitive Status: No family/caregiver present to determine baseline cognitive functioning ?  ?  ?  ?  ?  ?  ?  ?  ?  ?  ?  ?  ?  ?  ?  ?  ?General Comments: Alert and oriented to self, location as [facility]; unaware of date; unable to verbalize simple emergency procedures (i.e., calling 911 if fall/broken hip) ?  ?  ? ?  ?General Comments   ? ?  ?Exercises Other Exercises ?Other Exercises: Simple verbal problem-solving activities; requiring max cuing/assist from therapist to problem-solve calling 911 in emergency situations.  Max cuing for way-finding around unit during gait trial; difficulty with recall of new information. Limited insight into deficits and need for supervision/assist ?Other Exercises: Question ability to effectively manage finances, medications, meals, driving; discussed with OT for additional cognitive/independent living skills assessment.  ? ?Assessment/Plan  ?  ?PT Assessment Patient needs continued PT services  ?PT Problem List Decreased activity tolerance;Decreased balance;Decreased mobility;Decreased cognition ? ?   ?  ?PT Treatment Interventions DME instruction;Gait training;Functional mobility training;Therapeutic activities;Therapeutic  exercise;Balance training;Patient/family education;Cognitive remediation   ? ?PT Goals (Current goals can be found in the Care Plan section)  ?Acute Rehab PT Goals ?Patient Stated Goal: to return home ?PT Goal Formulation: With patient ?Time For Goal Achievement: 10/15/21 ?Potential to Achieve Goals: Fair ? ?  ?Frequency Min 2X/week ?  ? ? ?Co-evaluation   ?  ?  ?  ?  ? ? ?  ?AM-PAC PT "6 Clicks" Mobility  ?Outcome Measure Help needed turning from your back to your side while in a flat bed without using bedrails?: None ?Help needed moving from lying on your back to sitting on the side of a flat bed without using bedrails?: None ?Help needed moving to and from a bed to a chair (including a wheelchair)?: None ?Help needed standing up from a chair using your arms (e.g., wheelchair or bedside chair)?: None ?Help needed to walk in hospital room?: None ?Help needed climbing 3-5 steps with a railing? : A Little ?6 Click Score: 23 ? ?  ?End of Session Equipment Utilized During Treatment: Gait belt ?Activity Tolerance: Patient tolerated treatment well ?Patient left: in bed;with call bell/phone within reach;with nursing/sitter in room ?Nurse Communication: Mobility status ?PT Visit Diagnosis: Muscle weakness (generalized) (M62.81);Difficulty in walking, not elsewhere classified (R26.2) ?  ? ?Time: 3299-2426 ?PT Time Calculation (min) (ACUTE ONLY): 17 min ? ? ?Charges:   PT Evaluation ?$PT Eval Moderate Complexity: 1 Mod ?  ?  ?   ? ?Tramaine Sauls H. Owens Shark, PT, DPT, NCS ?10/01/21, 2:15  PM ?[phone] ? ? ?

## 2021-10-01 NOTE — Evaluation (Signed)
Occupational Therapy Evaluation ?Patient Details ?Name: Christine [name] ?MRN: [id number] ?DOB: [birth year] ?Today's Date: 10/01/2021 ? ? ?History of Present Illness presented to ER secondary to acute onset of chest pain; troponins negative, EKG unremarkable. Recommended for outpatient cardiac follow up.  Hospital course complicated by AMS, intermittent agitation/restlessness.  ? ?Clinical Impression ?  ?Patient presenting with decreased Ind in self care, balance, functional mobility/transfers, endurance, and safety awareness. Patient reports living at home alone and being independent at baseline. Pt's niece present at beginning of session and shakes head "no" when pt discussing home set up to let therapist know information being given was not correct. Patient currently functioning at supervision level for self care tasks and functional mobility without use of AD. ? ?OT gave pt SLUM assessment to look further at cognition. Pt reports completing 10th grade in school. Pt is oriented to self and location only. Pt scored a 8/30 putting her in the dementia category and based on this score pt should have 24/7 supervision at home for safety. Patient will benefit from acute OT to increase overall independence in the areas of ADLs, functional mobility, and safety awareness in order to safely discharge home with 24/7 assistance. She may benefit from transitioning to LTC or an ALF type setting in the future if she does not have support at home.  ?   ? ?Recommendations for follow up therapy are one component of a multi-disciplinary discharge planning process, led by the attending physician.  Recommendations may be updated based on patient status, additional functional criteria and insurance authorization.  ? ?Follow Up Recommendations ? No OT follow up  ?  ?Assistance Recommended at Discharge Frequent or constant Supervision/Assistance  ?Patient can return home with the following Assistance with cooking/housework;Assist for  transportation;Direct supervision/assist for financial management;Direct supervision/assist for medications management ? ?  ?Functional Status Assessment ? Patient has not had a recent decline in their functional status  ?Equipment Recommendations ? None recommended by OT  ?  ?   ?Precautions / Restrictions Precautions ?Precautions: Fall ?Restrictions ?Weight Bearing Restrictions: No  ? ?  ? ?Mobility Bed Mobility ?Overal bed mobility: Modified Independent ?  ?  ?  ?  ?  ?  ?  ?  ? ?Transfers ?Overall transfer level: Needs assistance ?Equipment used: None ?Transfers: Sit to/from Stand ?Sit to Stand: Supervision ?  ?  ?  ?  ?  ?General transfer comment: mild sway with initial stance, but self-corrects ?  ? ?  ?Balance Overall balance assessment: Needs assistance ?Sitting-balance support: No upper extremity supported, Feet supported ?Sitting balance-Leahy Scale: Good ?  ?  ?Standing balance support: No upper extremity supported ?Standing balance-Leahy Scale: Fair ?  ?  ?  ?  ?  ?  ?  ?  ?  ?  ?  ?  ?   ? ?ADL either performed or assessed with clinical judgement  ? ?ADL Overall ADL's : Needs assistance/impaired ?  ?  ?  ?  ?  ?  ?  ?  ?  ?  ?  ?  ?  ?  ?  ?  ?  ?  ?  ?General ADL Comments: supervision overall  ? ? ? ?Vision Patient Visual Report: No change from baseline ?   ?   ?   ?   ? ?Pertinent Vitals/Pain Pain Assessment ?Pain Assessment: No/denies pain  ? ? ? ?Hand Dominance Right ?  ?Extremity/Trunk Assessment Upper Extremity Assessment ?Upper Extremity Assessment: Overall WFL for tasks assessed ?  ?  Lower Extremity Assessment ?Lower Extremity Assessment: Overall WFL for tasks assessed ?  ?  ?  ?Communication Communication ?Communication: No difficulties ?  ?Cognition Arousal/Alertness: Awake/alert ?Behavior During Therapy: [facility] for tasks assessed/performed ?Overall Cognitive Status: No family/caregiver present to determine baseline cognitive functioning ?  ?  ?  ?  ?  ?  ?  ?  ?  ?  ?  ?  ?  ?  ?  ?  ?General  Comments: Pt is pleasant and cooperative. Scored 8/30 on SLUMS this session. Pt's family member present and report. ?  ?  ?   ?   ?   ? ? ?Home Living Family/patient expects to be discharged to:: Private residence ?Living Arrangements: Alone ?Available Help at Discharge: Family ?Type of Home: Apartment ?Home Access: Stairs to enter ?Entrance Stairs-Number of Steps: 3-4 ?  ?Home Layout: One level ?  ?  ?Bathroom Shower/Tub: Tub/shower unit ?  ?  ?  ?  ?Home Equipment: None ?  ?  ?  ? ?  ?Prior Functioning/Environment Prior Level of Function : Independent/Modified Independent ?  ?  ?  ?  ?  ?  ?Mobility Comments: Indep with ADLs, household and community mobilization without assist device; + driving; + cooking/cleaning ?  ?  ? ?  ?  ?OT Problem List: Decreased strength;Decreased activity tolerance;Impaired balance (sitting and/or standing);Decreased cognition ?  ?   ?OT Treatment/Interventions: Self-care/ADL training;Therapeutic exercise;Therapeutic activities;Balance training;Patient/family education;Cognitive remediation/compensation  ?  ?OT Goals(Current goals can be found in the care plan section) Acute Rehab OT Goals ?Patient Stated Goal: to go home ?OT Goal Formulation: With patient ?Time For Goal Achievement: 10/15/21 ?Potential to Achieve Goals: Good  ?OT Frequency: Min 2X/week ?  ? ?   ?AM-PAC OT "6 Clicks" Daily Activity     ?Outcome Measure Help from another person eating meals?: None ?Help from another person taking care of personal grooming?: None ?Help from another person toileting, which includes using toliet, bedpan, or urinal?: None ?Help from another person bathing (including washing, rinsing, drying)?: None ?Help from another person to put on and taking off regular upper body clothing?: None ?Help from another person to put on and taking off regular lower body clothing?: None ?6 Click Score: 24 ?  ?End of Session Nurse Communication: Mobility status ? ?Activity Tolerance: Patient tolerated treatment  well ?Patient left: in bed;with call bell/phone within reach;with nursing/sitter in room;with bed alarm set ? ?OT Visit Diagnosis: Unsteadiness on feet (R26.81);Muscle weakness (generalized) (M62.81)  ?              ?Time: 1440-1505 ?OT Time Calculation (min): 25 min ?Charges:  OT General Charges ?$OT Visit: 1 Visit ?OT Evaluation ?$OT Eval Moderate Complexity: 1 Mod ?OT Treatments ?$Therapeutic Activity: 8-22 mins ? ?Darleen Crocker, [place], OTR/L , CBIS ?ascom [phone]  ?10/01/21, 3:47 PM  ?

## 2021-10-01 NOTE — Progress Notes (Signed)
?Progress Note ? ? ?Patient: Christine [name] AOZ:[id number] DOB: 1946/03/20 DOA: 09/28/2021     0 ? ?DOS: the patient was seen and examined on 10/01/2021 ?  ?Brief hospital course: ?This 76 years old female with PMH significant for coronary artery disease s/p stent/angioplasty s/p AICD placement, chronic systolic heart failure, history of breast cancer and anxiety presented in the ED for the evaluation of chest pain which woke her up early in the morning.  Patient describes chest pain as crushing and severe heavy weight on her left chest.  Patient denies any shortness of breath nausea vomiting or diaphoresis or palpitations.  Symptom lasted for 1-1/2-hour and was given sublingual nitro which resolved the chest pain. ?Patient is admitted for chest pain to rule out ACS.  EKG and troponin unremarkable.  Cardiology was consulted recommended no intervention needed at this time.  Patient needs to follow-up with cardiology outpatient.  Patient seems confused, forgetfulness, TOC is consulted for evaluation for safe disposition home. ? ?Assessment and Plan: ?* Chest pain ?Patient with known history of coronary artery disease status post stent/angioplasty. ?She presented with sudden onset of left-sided chest pain that woke her up from sleep. ?Chest pain resolved with nitroglycerin,  concerning for ACS. ?EKG unremarkable, troponins negative. ?Continue aspirin, Plavix, carvedilol, statins. ?Cardiology is consulted, started on Imdur 30 mg daily. ?Patient does not need any ischemic evaluation at this time. ?Cardiology recommended outpatient follow-up in 1-2 weeks . ?Patient reports chest pain has resolved. ?Echo showed LVEF 30-35%, stable as prior echo. ? ? ?Cognitive decline ?Patient does have mild cognitive decline. ?She is fully awake, oriented x 3, answers appropriately. ?She does remains confused sometimes, lives by self. ?CT head unremarkable.  HIV, RPR, B12, vitamin D, TSH normal ?PT and OT recommended SNF ? ?Thrombocytopenia  ([place]) ?Appears chronic.  No evidence of bleeding ?Monitor closely during this hospitalization ? ?Depression ?Continue venlafaxine. ?Patient patient seems to have dementia, confusion and forgetfulness. ?Psych consulted for evaluation.  Does not meet criteria for inpatient psych hospitalization ? ?Breast cancer of upper-inner quadrant of right female breast ([place]) ?Continue tamoxifen. ? ?FAILURE, SYSTOLIC HEART, CHRONIC ?HFr EF / Cardiomyopathy /Abandoned AICD ?Appears compensated.  Patient does not appear fluid overloaded. ?Patient has had longstanding issue of noncompliance and not following up. ?Her AICD has been abandoned for years. ?Continue carvedilol with holding parameters due to bradycardia and Avapro ?Hold furosemide for now.  Patient does not appear volume overloaded. ?2D Echo showed LVEF 30-35%. ? ? ?Essential hypertension ?Continue Avapro and carvedilol ? ?Current tobacco use-resolved as of 09/29/2021 ?Smoking cessation has been discussed with patient in detail ?She states that she has cut back on the number of cigarettes that she smokes ?We will place patient on a nicotine transdermal patch 7 mg daily ? ? ? ?Subjective: Patient was seen and examined at bedside.  Overnight events noted. ?Patient denies any chest pain, shortness of breath, dizziness. ?Patient does have intermittent forgetfulness.  Psychiatry consulted, Patient does have a capacity to make decisions. ? ?Physical Exam: ?Vitals:  ? 09/30/21 1654 09/30/21 1927 09/30/21 2333 10/01/21 0810  ?BP: 130/78 94/61 (!) 108/57 121/68  ?Pulse: (!) 58 74 77 (!) 59  ?Resp: '16 16 16   '$ ?Temp: 98.7 ?F (37.1 ?C) 97.8 ?F (36.6 ?C) (!) 97.3 ?F (36.3 ?C) 98.6 ?F (37 ?C)  ?TempSrc: Oral   Oral  ?SpO2: 98% 98% 99% 99%  ?Weight:      ?Height:      ? ?General exam: Appears frail, chronically ill  looking, deconditioned, not in any distress. ?Respiratory system: CTA bilaterally, no wheezing, no crackles, normal respiratory effort. ?Cardiovascular system: S1-S2 heard,  regular rate and rhythm, no murmur. ?Gastrointestinal system: Abdomen is soft, non tender, non distended, BS+ ?Central nervous system: Alert, oriented x 3, no focal neurological deficits. ?Extremities: No edema, no cyanosis, no clubbing. ?Psychiatry: Mood, insight, judgment normal ? ?Data Reviewed: ?I have Reviewed nursing notes, Vitals, and Lab results since pt's last encounter. Pertinent lab results CBC, BMP ?I have ordered test including CBC, BMP ?I have reviewed the last note from cardiologist/psychiatry,  ?I have discussed pt's care plan and test results with patient, son and niece.  ? ?Family Communication: Son and niece at bedside. ? ?Disposition: ?Status is: Observation ?The patient remains OBS appropriate and will d/c before 2 midnights. ?  ?Patient was admitted for chest pain to rule out ACS.  Chest pain has resolved.  Patient lives alone, seems confused and forgetfulness.  TOC consulted for evaluation of safe disposition.  PT recommended SNF, TOC working on SNF placement ? ? Planned Discharge Destination: Home ? ? ? ? ?Time spent: 35 minutes ? ?Author: ?Shawna Clamp, MD ?10/01/2021 12:10 PM ? ?For on call review [email or web address].  ?

## 2021-10-02 DIAGNOSIS — R4189 Other symptoms and signs involving cognitive functions and awareness: Secondary | ICD-10-CM | POA: Diagnosis not present

## 2021-10-02 DIAGNOSIS — D696 Thrombocytopenia, unspecified: Secondary | ICD-10-CM | POA: Diagnosis not present

## 2021-10-02 DIAGNOSIS — R072 Precordial pain: Secondary | ICD-10-CM | POA: Diagnosis not present

## 2021-10-02 LAB — COMPREHENSIVE METABOLIC PANEL
ALT: 11 U/L (ref 0–44)
AST: 21 U/L (ref 15–41)
Albumin: 3.4 g/dL — ABNORMAL LOW (ref 3.5–5.0)
Alkaline Phosphatase: 38 U/L (ref 38–126)
Anion gap: 5 (ref 5–15)
BUN: 26 mg/dL — ABNORMAL HIGH (ref 8–23)
CO2: 23 mmol/L (ref 22–32)
Calcium: 8.4 mg/dL — ABNORMAL LOW (ref 8.9–10.3)
Chloride: 112 mmol/L — ABNORMAL HIGH (ref 98–111)
Creatinine, Ser: 1.04 mg/dL — ABNORMAL HIGH (ref 0.44–1.00)
GFR, Estimated: 56 mL/min — ABNORMAL LOW (ref >60)
Glucose, Bld: 83 mg/dL (ref 70–99)
Potassium: 3.9 mmol/L (ref 3.5–5.1)
Sodium: 140 mmol/L (ref 135–145)
Total Bilirubin: 0.5 mg/dL (ref 0.3–1.2)
Total Protein: 5.9 g/dL — ABNORMAL LOW (ref 6.5–8.1)

## 2021-10-02 LAB — CBC
HCT: 36.5 % (ref 36.0–46.0)
Hemoglobin: 12 g/dL (ref 12.0–15.0)
MCH: 29.4 pg (ref 26.0–34.0)
MCHC: 32.9 g/dL (ref 30.0–36.0)
MCV: 89.5 fL (ref 80.0–100.0)
Platelets: 125 10*3/uL — ABNORMAL LOW (ref 150–400)
RBC: 4.08 MIL/uL (ref 3.87–5.11)
RDW: 13.8 % (ref 11.5–15.5)
WBC: 8 10*3/uL (ref 4.0–10.5)
nRBC: 0 % (ref 0.0–0.2)

## 2021-10-02 LAB — PHOSPHORUS: Phosphorus: 3.8 mg/dL (ref 2.5–4.6)

## 2021-10-02 LAB — MAGNESIUM: Magnesium: 2.1 mg/dL (ref 1.7–2.4)

## 2021-10-02 NOTE — Progress Notes (Signed)
Melatonin PRN given ?

## 2021-10-02 NOTE — TOC Progression Note (Signed)
Transition of Care (TOC) - Progression Note  ? ? ?Patient Details  ?Name: Christine [name] ?MRN: [id number] ?Date of Birth: 1946-05-07 ? ?Transition of Care (TOC) CM/SW Contact  ?Alberteen Sam, LCSW ?Phone Number: ?10/02/2021, 3:07 PM ? ?Clinical Narrative:    ? ?CSW spoke with patient's son Christine [name] he reports he did stay with patient last night and will plan on staying with patient tonight, then transporting her home tomorrow for discharge. Christine [name] plans on staying with patient to ensure she's safe at home.  ? ?MD updated on above.  ? ? ?Expected Discharge Plan: Home/Self Care ?Barriers to Discharge: No Barriers Identified ? ?Expected Discharge Plan and Services ?Expected Discharge Plan: Home/Self Care ?  ?  ?  ?  ?Expected Discharge Date: 09/29/21               ?  ?  ?  ?  ?  ?  ?  ?  ?  ?  ? ? ?Social Determinants of Health (SDOH) Interventions ?  ? ?Readmission Risk Interventions ?   ? View : No data to display.  ?  ?  ?  ? ? ?

## 2021-10-02 NOTE — Progress Notes (Signed)
?PROGRESS NOTE ? ? ? ?Christine [name]  PTW:[id number] DOB: 14-Oct-1945 DOA: 09/28/2021 ?PCP: Baxter Hire, MD ? ?Assessment & Plan: ?  ?Principal Problem: ?  Chest pain ?Active Problems: ?  Essential hypertension ?  FAILURE, SYSTOLIC HEART, CHRONIC ?  Breast cancer of upper-inner quadrant of right female breast ([place]) ?  Depression ?  Thrombocytopenia ([place]) ?  Cognitive decline ? ? ?Chest pain: hx of CAD s/p stent/angioplasty. Continue on aspirin, carvedilol, statin, imdur. No need for ischemic evaluation. Cardio recs f/u 1-2 weeks  ?  ?Cognitive decline: CT head unremarkable.  Lives independently. HIV, RPR, B12, vitamin D, TSH normal.  ?  ?Thrombocytopenia: chronic. Will continue to monitor  ?  ?Depression: severity unknown. Continue on venlafaxine  ?  ?Breast cancer: of upper-inner quadrant of right female breast. Continue on tamoxifen  ? ?Chronic systolic CHF: appears compensated. Continue on carvedilol, avapro. Hold lasix. Monitor I/Os  ?  ?HTN: continue on carvedilol, avapro  ? ? ? ?DVT prophylaxis: SCDs ?Code Status: full  ?Family Communication:  ?Disposition Plan: d/c home tomorrow in pt's son's care. ? ?Level of care: Telemetry Cardiac ? ?Status is: Inpatient ?Remains inpatient appropriate because: will d/c tomorrow in son's care ? ? ?Consultants:  ? ? ?Procedures:  ? ?Antimicrobials:  ? ? ?Subjective: ?Pt denies any complaints  ? ?Objective: ?Vitals:  ? 10/01/21 1937 10/02/21 0003 10/02/21 7001 10/02/21 0734  ?BP: 109/61 117/64 116/64 123/81  ?Pulse: 65 (!) 52 61 (!) 49  ?Resp: '19 20 16 18  '$ ?Temp: 97.7 ?F (36.5 ?C) 97.6 ?F (36.4 ?C) 98.2 ?F (36.8 ?C) 98.8 ?F (37.1 ?C)  ?TempSrc:      ?SpO2: 99% 99% 97% 98%  ?Weight:      ?Height:      ? ? ?Intake/Output Summary (Last 24 hours) at 10/02/2021 0740 ?Last data filed at 10/01/2021 1929 ?Gross per 24 hour  ?Intake 240 ml  ?Output 450 ml  ?Net -210 ml  ? ?Filed Weights  ? 09/28/21 0502  ?Weight: 37.6 kg  ? ? ?Examination: ? ?General exam: Appears calm and comfortable   ?Respiratory system: Clear to auscultation. Respiratory effort normal. ?Cardiovascular system: S1 & S2 +. No rubs, gallops or clicks.  ?Gastrointestinal system: Abdomen is nondistended, soft and nontender Normal bowel sounds heard. ?Central nervous system: Alert and oriented. Moves all extremities  ?Psychiatry: Judgement and insight appears poor. Mood & affect appropriate.  ? ? ? ?Data Reviewed: I have personally reviewed following labs and imaging studies ? ?CBC: ?Recent Labs  ?Lab 09/28/21 ?7494 10/02/21 ?0603  ?WBC 7.7 8.0  ?HGB 12.0 12.0  ?HCT 36.6 36.5  ?MCV 92.0 89.5  ?PLT 93* 125*  ? ?Basic Metabolic Panel: ?Recent Labs  ?Lab 09/28/21 ?4967 10/02/21 ?0603  ?NA 138 140  ?K 4.1 3.9  ?CL 107 112*  ?CO2 24 23  ?GLUCOSE 94 83  ?BUN 16 26*  ?CREATININE 1.11* 1.04*  ?CALCIUM 8.5* 8.4*  ?MG  --  2.1  ?PHOS  --  3.8  ? ?GFR: ?Estimated Creatinine Clearance: 28.2 mL/min (A) (by C-G formula based on SCr of 1.04 mg/dL (H)). ?Liver Function Tests: ?Recent Labs  ?Lab 10/02/21 ?0603  ?AST 21  ?ALT 11  ?ALKPHOS 38  ?BILITOT 0.5  ?PROT 5.9*  ?ALBUMIN 3.4*  ? ?No results for input(s): LIPASE, AMYLASE in the last 168 hours. ?No results for input(s): AMMONIA in the last 168 hours. ?Coagulation Profile: ?No results for input(s): INR, PROTIME in the last 168 hours. ?Cardiac Enzymes: ?No results  for input(s): CKTOTAL, CKMB, CKMBINDEX, TROPONINI in the last 168 hours. ?BNP (last 3 results) ?No results for input(s): PROBNP in the last 8760 hours. ?HbA1C: ?No results for input(s): HGBA1C in the last 72 hours. ?CBG: ?No results for input(s): GLUCAP in the last 168 hours. ?Lipid Profile: ?No results for input(s): CHOL, HDL, LDLCALC, TRIG, CHOLHDL, LDLDIRECT in the last 72 hours. ?Thyroid Function Tests: ?No results for input(s): TSH, T4TOTAL, FREET4, T3FREE, THYROIDAB in the last 72 hours. ?Anemia Panel: ?No results for input(s): VITAMINB12, FOLATE, FERRITIN, TIBC, IRON, RETICCTPCT in the last 72 hours. ?Sepsis Labs: ?No results for  input(s): PROCALCITON, LATICACIDVEN in the last 168 hours. ? ?No results found for this or any previous visit (from the past 240 hour(s)).  ? ? ? ? ? ?Radiology Studies: ?No results found. ? ? ? ? ? ?Scheduled Meds: ? aspirin EC  81 mg Oral Daily  ? carvedilol  25 mg Oral BID  ? clopidogrel  75 mg Oral Daily  ? hydrOXYzine  25 mg Oral TID  ? irbesartan  300 mg Oral Daily  ? isosorbide mononitrate  30 mg Oral Daily  ? LORazepam  0.25 mg Oral BID  ? nicotine  7 mg Transdermal Daily  ? pantoprazole  40 mg Oral Daily  ? rosuvastatin  10 mg Oral Daily  ? tamoxifen  20 mg Oral Daily  ? ?Continuous Infusions: ? ? LOS: 1 day  ? ? ?Time spent: 15 mins  ? ? ? ?Wyvonnia Dusky, MD ?[facility] ?Pager [phone] ? ?If 7PM-7AM, please contact night-coverage ?10/02/2021, 7:40 AM  ? ?

## 2021-10-02 NOTE — Progress Notes (Signed)
Occupational Therapy Treatment ?Patient Details ?Name: Christine [name] ?MRN: [id number] ?DOB: 10-19-1945 ?Today's Date: 10/02/2021 ? ? ?History of present illness presented to ER secondary to acute onset of chest pain; troponins negative, EKG unremarkable. Recommended for outpatient cardiac follow up.  Hospital course complicated by AMS, intermittent agitation/restlessness. ?  ?OT comments ? Upon entering the room, pt supine in bed with sitter present in room. Pt is agreeable to OT intervention. She does not remember this therapist from yesterday and she is unable to verbalized what we did during last session. Pt is pleasant and cooperative throughout. She dons non slip slippers with cuing for safety awareness. Pt ambulates into bathroom for toileting needs with close supervision and without use of AD for safety. Pt is able to void and performs hygiene and clothing management without assistance. Pt cued to wash hands at sink. Hot water on and pt reports, " That is too hot. I can't use that". Pt needs increased time and cuing to problem solve turning on cold water in order to safety wash hands in sink. She then ambulates around RN station with supervision to new room. Sitter remains in room as therapist exits. OT continues to recommend 24/7 supervision based on pt's difficulty with problem solving and safety during routine tasks.   ? ?Recommendations for follow up therapy are one component of a multi-disciplinary discharge planning process, led by the attending physician.  Recommendations may be updated based on patient status, additional functional criteria and insurance authorization. ?   ?Follow Up Recommendations ? No OT follow up  ?  ?Assistance Recommended at Discharge Frequent or constant Supervision/Assistance  ?Patient can return home with the following ? Assistance with cooking/housework;Assist for transportation;Direct supervision/assist for financial management;Direct supervision/assist for medications  management ?  ?Equipment Recommendations ? None recommended by OT  ?  ?   ?Precautions / Restrictions Precautions ?Precautions: Fall ?Restrictions ?Weight Bearing Restrictions: No  ? ? ?  ? ?Mobility Bed Mobility ?Overal bed mobility: Modified Independent ?  ?  ?  ?  ?  ?  ?  ?  ? ?Transfers ?Overall transfer level: Needs assistance ?Equipment used: None ?Transfers: Sit to/from Stand ?Sit to Stand: Supervision ?  ?  ?  ?  ?  ?  ?  ?  ?Balance Overall balance assessment: Needs assistance ?Sitting-balance support: No upper extremity supported, Feet supported ?Sitting balance-Leahy Scale: Good ?  ?  ?Standing balance support: No upper extremity supported ?Standing balance-Leahy Scale: Fair ?  ?  ?  ?  ?  ?  ?  ?  ?  ?  ?  ?  ?   ? ?ADL either performed or assessed with clinical judgement  ? ?ADL Overall ADL's : Needs assistance/impaired ?  ?  ?Grooming: Wash/dry hands;Wash/dry face;Standing;Supervision/safety;Cueing for safety;Cueing for sequencing ?  ?  ?  ?  ?  ?  ?  ?Lower Body Dressing: Supervision/safety;Set up ?  ?  ?  ?Toileting- Clothing Manipulation and Hygiene: Supervision/safety ?  ?Tub/ Shower Transfer: Supervision/safety ?  ?Functional mobility during ADLs: Supervision/safety ?  ?  ? ?Extremity/Trunk Assessment Upper Extremity Assessment ?Upper Extremity Assessment: Overall WFL for tasks assessed ?  ?  ?  ?  ?  ? ?Vision Patient Visual Report: No change from baseline ?  ?  ?   ?   ? ?Cognition Arousal/Alertness: Awake/alert ?Behavior During Therapy: [facility] for tasks assessed/performed ?Overall Cognitive Status: No family/caregiver present to determine baseline cognitive functioning ?  ?  ?  ?  ?  ?  ?  ?  ?  ?  ?  ?  ?  ?  ?  ?  ?  General Comments: Pt is pleasant and cooperative. She does not remember this therapist from yesterday. min - mod cuing for problem solving and safety awareness. ?  ?  ?   ?   ?   ?   ? ? ?Pertinent Vitals/ Pain       Pain Assessment ?Pain Assessment: No/denies pain ? ?   ?    ? ?Frequency ? Min 2X/week  ? ? ? ? ?  ?Progress Toward Goals ? ?OT Goals(current goals can now be found in the care plan section) ? Progress towards OT goals: Progressing toward goals ? ?Acute Rehab OT Goals ?Patient Stated Goal: to go home ?OT Goal Formulation: With patient ?Time For Goal Achievement: 10/15/21 ?Potential to Achieve Goals: Good  ?Plan Discharge plan remains appropriate;Frequency remains appropriate   ? ?   ?AM-PAC OT "6 Clicks" Daily Activity     ?Outcome Measure ? ? Help from another person eating meals?: None ?Help from another person taking care of personal grooming?: None ?Help from another person toileting, which includes using toliet, bedpan, or urinal?: None ?Help from another person bathing (including washing, rinsing, drying)?: None ?Help from another person to put on and taking off regular upper body clothing?: None ?Help from another person to put on and taking off regular lower body clothing?: None ?6 Click Score: 24 ? ?  ?End of Session   ? ?OT Visit Diagnosis: Unsteadiness on feet (R26.81);Muscle weakness (generalized) (M62.81) ?  ?Activity Tolerance Patient tolerated treatment well ?  ?Patient Left in bed;with call bell/phone within reach;with nursing/sitter in room;with bed alarm set ?  ?Nurse Communication Mobility status ?  ? ?   ? ?Time: 3568-6168 ?OT Time Calculation (min): 19 min ? ?Charges: OT General Charges ?$OT Visit: 1 Visit ?OT Treatments ?$Self Care/Home Management : 8-22 mins ? ?Darleen Crocker, MS, OTR/L , CBIS ?ascom [phone]  ?10/02/21, 1:22 PM  ?

## 2021-10-02 NOTE — TOC Initial Note (Addendum)
Transition of Care (TOC) - Initial/Assessment Note  ? ? ?Patient Details  ?Name: Christine [name] ?MRN: [id number] ?Date of Birth: [birth year] ? ?Transition of Care (TOC) CM/SW Contact:    ?Alberteen Sam, LCSW ?Phone Number: ?10/02/2021, 9:57 AM ? ?Clinical Narrative:                 ? ?Update: phone number patient believed may be her son [name] called back to report they do not know a [name], wrong number.  ?Niece called back reports she will text CSW Mike's  number to reach out.  ? ? ? ?CSW met with patient at bedside to discuss discharge planning, hx of concerns with staff regarding patient's memory and ability to go home alone.  ? ?Patient was seen and cleared by Psych as competent.  ? ?PT/OT have signed off stating "no therapy follow up" as patient's mobility is independent.  ? ?CSW spoke with patient at bedside who reports her niece can come check on her occasionally. Patient endorses having a son Carroll Kinds however he moves around a lot and she is unsure of his phone number, the last number she has for him is from when he called her from a friends phone, that number is [phone]. CSW has called and lvm on this number requesting return call.  ? ?CSW has also called niece Tammy to confirm support, however received vm and vm box full. CSW has texted Tammy pending call back.  ? ?Patient provided CSW her cell phone, reports being able to use this at home should she need help. Noted patient has extensive list of contacts in her cell phone to aid in any potential memory deficits, including the number to APS, social services, [employer], [employer] etc.  ? ?Patient aware to call 911 on her phoen number in case of emergency, and aware to call her niece for other needs.  ? ?At this time, no discharge concerns with patient going home following bedside assessment today.  ? ?MD has been updated on above.  ? ?Expected Discharge Plan: Home/Self Care ?Barriers to Discharge: No Barriers Identified ? ? ?Patient Goals and CMS  Choice ?Patient states their goals for this hospitalization and ongoing recovery are:: to go home ?CMS Medicare.gov Compare Post Acute Care list provided to:: Patient ?Choice offered to / list presented to : Patient ? ?Expected Discharge Plan and Services ?Expected Discharge Plan: Home/Self Care ?  ?  ?  ?  ?Expected Discharge Date: 09/29/21               ?  ?  ?  ?  ?  ?  ?  ?  ?  ?  ? ?Prior Living Arrangements/Services ?  ?Lives with:: Self ?Patient language and need for interpreter reviewed:: Yes ?       ?Need for Family Participation in Patient Care: Yes (Comment) ?Care giver support system in place?: Yes (comment) ?  ?Criminal Activity/Legal Involvement Pertinent to Current Situation/Hospitalization: No - Comment as needed ? ?Activities of Daily Living ?Home Assistive Devices/Equipment: None ?ADL Screening (condition at time of admission) ?Patient's cognitive ability adequate to safely complete daily activities?: Yes ?Is the patient deaf or have difficulty hearing?: Yes ?Does the patient have difficulty seeing, even when wearing glasses/contacts?: No ?Does the patient have difficulty concentrating, remembering, or making decisions?: Yes ?Patient able to express need for assistance with ADLs?: Yes ?Does the patient have difficulty dressing or bathing?: No ?Independently performs ADLs?: Yes (appropriate for developmental age) ?Does the  patient have difficulty walking or climbing stairs?: No ?Weakness of Legs: None ?Weakness of Arms/Hands: None ? ?Permission Sought/Granted ?  ?  ? Share Information with NAME: Burnet,Tammy ?   ?   ? Permission granted to share info w Contact Information: [phone] ? ?Emotional Assessment ?Appearance:: Appears stated age ?Attitude/Demeanor/Rapport: Gracious ?Affect (typically observed): Calm ?Orientation: : Oriented to Self, Oriented to Place, Oriented to  Time, Oriented to Situation ?Alcohol / Substance Use: Not Applicable ?Psych Involvement: No (comment) ? ?Admission  diagnosis:  Bradycardia [R00.1] ?Chest pressure [R07.89] ?Chest pain [R07.9] ?Patient Active Problem List  ? Diagnosis Date Noted  ? Cognitive decline 10/01/2021  ? Chest pain 09/28/2021  ? Thrombocytopenia ([place]) 09/28/2021  ? AAA (abdominal aortic aneurysm) without rupture ([place]) 03/13/2019  ? Breast cancer of upper-inner quadrant of right female breast ([facility]) 07/01/2016  ? Chronic obstructive pulmonary disease ([place]) 04/05/2014  ? Breast cancer ([place]) 11/25/2013  ? Depression 11/25/2013  ? Implantable cardioverter-defibrillator (ICD) in situ 11/09/2008  ? ABNORMAL THYROID FUNCTION TESTS 07/30/2007  ? Essential hypertension 11/16/2006  ? FAILURE, SYSTOLIC HEART, CHRONIC 50/51/0712  ? ?PCP:  Baxter Hire, MD ?Pharmacy:   ?[facility], [place] ?[place] [address] ?Phone: [phone] Fax: [phone] ? ? ? ? ?Social Determinants of Health (SDOH) Interventions ?  ? ?Readmission Risk Interventions ?   ? View : No data to display.  ?  ?  ?  ? ? ? ?

## 2021-10-02 NOTE — Progress Notes (Signed)
Physical Therapy Treatment ?Patient Details ?Name: Christine [name] ?MRN: [id number] ?DOB: Dec 29, 1945 ?Today's Date: 10/02/2021 ? ? ?History of Present Illness presented to ER secondary to acute onset of chest pain; troponins negative, EKG unremarkable. Recommended for outpatient cardiac follow up.  Hospital course complicated by AMS, intermittent agitation/restlessness. ? ?  ?PT Comments  ? ? Patient received in bed, sitter present for safety. She was attempting to get up independently. Patient is independent with bed mobility. Supervision for transfers and ambulation in hallway no AD. Ambulated up.down 6 steps with single rail and supervision. Had patient participate in cognitive tasks while ambulating. No decrease in balance or safety with this. She appears to be at baseline level of mobility but definitely has memory and judgement/problem solving limitations. Patient will benefit from increased supervision at home or memory care facility.  ?       ?Recommendations for follow up therapy are one component of a multi-disciplinary discharge planning process, led by the attending physician.  Recommendations may be updated based on patient status, additional functional criteria and insurance authorization. ? ?Follow Up Recommendations ? No PT follow up ?  ?  ?Assistance Recommended at Discharge Frequent or constant Supervision/Assistance  ?Patient can return home with the following A little help with bathing/dressing/bathroom;Assistance with cooking/housework;Assist for transportation;Direct supervision/assist for financial management ?  ?Equipment Recommendations ? None recommended by PT  ?  ?Recommendations for Other Services   ? ? ?  ?Precautions / Restrictions Precautions ?Precautions: Fall ?Precaution Comments: mod fall ?Restrictions ?Weight Bearing Restrictions: No  ?  ? ?Mobility ? Bed Mobility ?Overal bed mobility: Modified Independent ?Bed Mobility: Supine to Sit, Sit to Supine ?  ?  ?Supine to sit: Modified  independent (Device/Increase time) ?Sit to supine: Modified independent (Device/Increase time) ?  ?  ?  ? ?Transfers ?Overall transfer level: Needs assistance ?Equipment used: None ?Transfers: Sit to/from Stand ?Sit to Stand: Supervision ?  ?  ?  ?  ?  ?  ?  ? ?Ambulation/Gait ?Ambulation/Gait assistance: Supervision ?Gait Distance (Feet): 400 Feet ?Assistive device: None ?  ?  ?  ?  ?General Gait Details: She is generally safe with gait. Mild sway, no lob ? ? ?Stairs ?Stairs: Yes ?Stairs assistance: Supervision ?Stair Management: One rail Right, Forwards, Alternating pattern ?Number of Stairs: 6 ?General stair comments: generally safe with steps using rail ? ? ?Wheelchair Mobility ?  ? ?Modified Rankin (Stroke Patients Only) ?  ? ? ?  ?Balance Overall balance assessment: Mild deficits observed, not formally tested ?Sitting-balance support: Feet supported ?Sitting balance-Leahy Scale: Good ?  ?  ?Standing balance support: No upper extremity supported, During functional activity ?Standing balance-Leahy Scale: Good ?Standing balance comment: no lob, increased lateral sway at times. ?  ?  ?  ?  ?  ?  ?  ?High Level Balance Comments: Had patient count by 2s while ambulating and think of objects beginning with "B". She was only able to come up with Bear. Balance and safety not impacted by cognitive tasks during ambulation ?  ?  ?  ?  ? ?  ?Cognition Arousal/Alertness: Awake/alert ?Behavior During Therapy: [employer] for tasks assessed/performed ?Overall Cognitive Status: No family/caregiver present to determine baseline cognitive functioning ?Area of Impairment: Safety/judgement, Awareness, Problem solving ?  ?  ?  ?  ?  ?  ?  ?  ?  ?  ?  ?  ?Safety/Judgement: Decreased awareness of safety ?Awareness: Emergent ?Problem Solving: Requires verbal cues, Requires tactile cues ?General Comments: Pt  is pleasant and cooperative. Decreased problem solving abilities. Went to get into bed on side with bedrail up. Required cues for basic  things. ?  ?  ? ?  ?Exercises   ? ?  ?General Comments   ?  ?  ? ?Pertinent Vitals/Pain Pain Assessment ?Pain Assessment: No/denies pain  ? ? ?Home Living   ?  ?  ?  ?  ?  ?  ?  ?  ?  ?   ?  ?Prior Function    ?  ?  ?   ? ?PT Goals (current goals can now be found in the care plan section) Acute Rehab PT Goals ?Patient Stated Goal: to return home ?PT Goal Formulation: With patient ?Time For Goal Achievement: 10/15/21 ?Potential to Achieve Goals: Fair ?Progress towards PT goals: Progressing toward goals ? ?  ?Frequency ? ? ? Min 2X/week ? ? ? ?  ?PT Plan Current plan remains appropriate  ? ? ?Co-evaluation   ?  ?  ?  ?  ? ?  ?AM-PAC PT "6 Clicks" Mobility   ?Outcome Measure ? Help needed turning from your back to your side while in a flat bed without using bedrails?: None ?Help needed moving from lying on your back to sitting on the side of a flat bed without using bedrails?: None ?Help needed moving to and from a bed to a chair (including a wheelchair)?: None ?Help needed standing up from a chair using your arms (e.g., wheelchair or bedside chair)?: None ?Help needed to walk in hospital room?: None ?Help needed climbing 3-5 steps with a railing? : A Little ?6 Click Score: 23 ? ?  ?End of Session Equipment Utilized During Treatment: Gait belt ?Activity Tolerance: Patient tolerated treatment well ?Patient left: in bed;with call bell/phone within reach;with nursing/sitter in room ?Nurse Communication: Mobility status ?PT Visit Diagnosis: Muscle weakness (generalized) (M62.81) ?  ? ? ?Time: 8466-5993 ?PT Time Calculation (min) (ACUTE ONLY): 15 min ? ?Charges:  $Gait Training: 8-22 mins          ?          ? ?[employer], PT, GCS ?10/02/21,1:55 PM ? ?

## 2021-10-03 LAB — CBC
HCT: 38 % (ref 36.0–46.0)
Hemoglobin: 12.4 g/dL (ref 12.0–15.0)
MCH: 29.6 pg (ref 26.0–34.0)
MCHC: 32.6 g/dL (ref 30.0–36.0)
MCV: 90.7 fL (ref 80.0–100.0)
Platelets: 117 10*3/uL — ABNORMAL LOW (ref 150–400)
RBC: 4.19 MIL/uL (ref 3.87–5.11)
RDW: 13.7 % (ref 11.5–15.5)
WBC: 7.5 10*3/uL (ref 4.0–10.5)
nRBC: 0 % (ref 0.0–0.2)

## 2021-10-03 LAB — BASIC METABOLIC PANEL
Anion gap: 4 — ABNORMAL LOW (ref 5–15)
BUN: 25 mg/dL — ABNORMAL HIGH (ref 8–23)
CO2: 26 mmol/L (ref 22–32)
Calcium: 8.4 mg/dL — ABNORMAL LOW (ref 8.9–10.3)
Chloride: 111 mmol/L (ref 98–111)
Creatinine, Ser: 1.09 mg/dL — ABNORMAL HIGH (ref 0.44–1.00)
GFR, Estimated: 53 mL/min — ABNORMAL LOW (ref >60)
Glucose, Bld: 85 mg/dL (ref 70–99)
Potassium: 4.1 mmol/L (ref 3.5–5.1)
Sodium: 141 mmol/L (ref 135–145)

## 2021-10-03 MED ORDER — ISOSORBIDE MONONITRATE ER 30 MG PO TB24
30.0000 mg | ORAL_TABLET | Freq: Every day | ORAL | 0 refills | Status: AC
Start: 2021-10-04 — End: 2022-04-16

## 2021-10-03 NOTE — Progress Notes (Signed)
Patient had received discharge orders and was waiting in room for family to arrive to transport home.  Patient was dressed, discharge education was completed and removed from telemetry.  Patient was instructed IV's would not be removed until family arrived and ready to leave.  While staff was caring for other patients, family arrived and left with patient.  Multiple attempts to contact patient or family but no one was reached.  Eventually staff was able to contact niece who verified the IV was still in place.  Family unable to return to hospital.  Family called local EMS and IV removed.   ?

## 2021-10-03 NOTE — Discharge Summary (Addendum)
Physician Discharge Summary  ?Christine [name] RAX:[id number] DOB: 16-Nov-1945 DOA: 09/28/2021 ? ?PCP: Baxter Hire, MD ? ?Admit date: 09/28/2021 ?Discharge date: 10/03/2021 ? ?Admitted From: home  ?Disposition:  home w/ home health  ? ?Recommendations for Outpatient Follow-up:  ?Follow up with PCP in 1-2 weeks ?F/u w/ cardio, Dr. Corky Sox, in 1-2 weeks ? ?Home Health: yes ?Equipment/Devices: ? ?Discharge Condition: stable  ?CODE STATUS:full  ?Diet recommendation: Heart Healthy  ? ?Brief/Interim Summary: ?HPI was taken from Dr. Francine Graven: ?Christine [name] is a 76 y.o. female with medical history significant for coronary artery disease status post stent angioplasty status post AICD placement, chronic systolic heart failure, history of breast cancer and anxiety who presents to the ER for evaluation of chest pain which woke her up early this morning at about 1 AM.  She describes it as a crushing and severe heavy weight on her left anterior chest wall.  Pain was nonradiating and she denies having any shortness of breath, nausea, no vomiting, no diaphoresis or palpitations.  Symptoms lasted for about half an hour and she called EMS.  They administered 1 sublingual nitroglycerin. ?At the time of my evaluation patient is chest pain-free. ?Patient denies having any abdominal pain, no fever, no chills, no cough, no changes in her bowel habits, no urinary symptoms, no blurred vision or focal deficit. ? ?As per Dr. Jimmye Norman 4/19-4/20/23: Pt presented w/ chest pain for which cardio was consulted. Pt was put on imdur and told to f/u outpatient in 1-2 weeks. No need for inpatient ischemic evaluation as per cardio. Of note, pt has hx of cognitive decline. CT head was unremarkable & HIV, RPR, B12, vitamin D & TSH were all normal. PT/OT evaluated pt and did recommend any f/u. Pt was d/c home in the care of the pt's son.  ? ?Discharge Diagnoses:  ?Principal Problem: ?  Chest pain ?Active Problems: ?  Essential hypertension ?  FAILURE, SYSTOLIC  HEART, CHRONIC ?  Breast cancer of upper-inner quadrant of right female breast ([place]) ?  Depression ?  Thrombocytopenia ([place]) ?  Cognitive decline ? ?Chest pain: hx of CAD s/p stent/angioplasty. Continue on aspirin, carvedilol, statin, imdur. No need for ischemic evaluation. Cardio recs f/u 1-2 weeks  ?  ?Cognitive decline: CT head unremarkable.  Lives independently. HIV, RPR, B12, vitamin D, TSH normal.  ?  ?Thrombocytopenia: chronic. Will continue to monitor  ?  ?Depression: severity unknown. Continue on venlafaxine  ?  ?Breast cancer: of upper-inner quadrant of right female breast. Continue on tamoxifen  ?  ?Chronic systolic CHF: appears compensated. Continue on carvedilol, avapro. Hold lasix. Monitor I/Os  ?  ?HTN: continue on carvedilol, avapro  ?  ? ?Discharge Instructions ? ?Discharge Instructions   ? ? Diet - low sodium heart healthy   Complete by: As directed ?  ? Discharge instructions   Complete by: As directed ?  ? F/u w/ PCP in 1 week. F/u w/ cardio, Dr. Corky Sox, in 1-2 weeks.  ? Increase activity slowly   Complete by: As directed ?  ? No wound care   Complete by: As directed ?  ? ?  ? ?Allergies as of 10/03/2021   ? ?   Reactions  ? Atorvastatin Other (See Comments)  ? increased LFT's  ? Lisinopril Cough  ? Oxycodone Hcl Itching, Other (See Comments)  ? Restless & jittery  ? Simvastatin Other (See Comments)  ? myalgias  ? ?  ? ?  ?Medication List  ?  ? ?TAKE  these medications   ? ?albuterol 108 (90 Base) MCG/ACT inhaler ?Commonly known as: VENTOLIN HFA ?Inhale 2 puffs into the lungs 4 (four) times daily as needed (wheezing/shortness of breath). ?  ?aspirin EC 81 MG tablet ?Take 81 mg by mouth daily. ?  ?bisacodyl 5 MG EC tablet ?Commonly known as: DULCOLAX ?Take 5 mg by mouth daily as needed for mild constipation or moderate constipation. ?  ?carvedilol 25 MG tablet ?Commonly known as: COREG ?TAKE 1 TABLET BY MOUTH TWICE A DAY ?  ?clopidogrel 75 MG tablet ?Commonly known as: PLAVIX ?TAKE 1 TABLET BY MOUTH  ONCE DAILY ?  ?furosemide 40 MG tablet ?Commonly known as: LASIX ?Take 40 mg by mouth daily as needed (fluid retention/swelling). ?  ?hydrOXYzine 25 MG tablet ?Commonly known as: ATARAX ?Take 25 mg by mouth 3 (three) times daily. ?  ?isosorbide mononitrate 30 MG 24 hr tablet ?Commonly known as: IMDUR ?Take 1 tablet (30 mg total) by mouth daily. ?Start taking on: October 04, 2021 ?  ?LORazepam 0.5 MG tablet ?Commonly known as: ATIVAN ?Take 0.25 mg by mouth 2 (two) times daily. ?  ?pantoprazole 40 MG tablet ?Commonly known as: PROTONIX ?Take 40 mg by mouth daily. ?  ?PEG 3350 17 GM/SCOOP Powd ?Take 17 g by mouth daily as needed (constipation.). ?  ?rosuvastatin 10 MG tablet ?Commonly known as: CRESTOR ?Take 10 mg by mouth daily. ?  ?tamoxifen 20 MG tablet ?Commonly known as: NOLVADEX ?Take 1 tablet (20 mg total) by mouth daily. ?  ?tetrahydrozoline 0.05 % ophthalmic solution ?Place 1 drop into both eyes 3 (three) times daily as needed (dry/irritated eyes). ?  ?traMADol 50 MG tablet ?Commonly known as: ULTRAM ?Take 25 mg by mouth 2 (two) times daily. ?  ?valsartan 320 MG tablet ?Commonly known as: DIOVAN ?Take 320 mg by mouth daily at 12 noon. ?  ?venlafaxine XR 37.5 MG 24 hr capsule ?Commonly known as: EFFEXOR-XR ?Take 37.5 mg by mouth daily. ?  ? ?  ? ? Follow-up Information   ? ? Andrez Grime, MD Follow up on 10/16/2021.   ?Specialty: Cardiology ?Why: DR CALLWOOD APPT MADE MAY 3RD 2023 2:30PM ?Contact information: ?[place] [address] ?[phone] ? ? ?  ?  ? ? Baxter Hire, MD Follow up in 1 week(s).   ?Specialty: Internal Medicine ?Why: APRIL 27TH 2023 AT 8:45 AM ?Contact information: ?[address] ?[place] [address] ?[phone] ? ? ?  ?  ? ?  ?  ? ?  ? ?Allergies  ?Allergen Reactions  ? Atorvastatin Other (See Comments)  ?  increased LFT's  ? Lisinopril Cough  ? Oxycodone Hcl Itching and Other (See Comments)  ?  Restless & jittery  ? Simvastatin Other (See Comments)  ?   myalgias  ? ? ?Consultations: ?Cardio  ? ? ?Procedures/Studies: ?CT HEAD WO CONTRAST (5MM) ? ?Result Date: 09/28/2021 ?CLINICAL DATA:  Altered mental status EXAM: CT HEAD WITHOUT CONTRAST TECHNIQUE: Contiguous axial images were obtained from the base of the skull through the vertex without intravenous contrast. RADIATION DOSE REDUCTION: This exam was performed according to the departmental dose-optimization program which includes automated exposure control, adjustment of the mA and/or kV according to patient size and/or use of iterative reconstruction technique. COMPARISON:  CT head 02/14/2005 FINDINGS: Brain: No acute intracranial hemorrhage, mass effect, or herniation. No extra-axial fluid collections. No evidence of acute territorial infarct. No hydrocephalus. Moderate cortical volume loss. Patchy hypodensities in the periventricular and subcortical white matter,  likely secondary to chronic microvascular ischemic changes. Vascular: Calcified plaques in the carotid siphons. Skull: Normal. Negative for fracture or focal lesion. Sinuses/Orbits: No acute finding. Other: None. IMPRESSION: Chronic changes with no acute intracranial process identified. Electronically Signed   By: Ofilia Neas M.D.   On: 09/28/2021 16:28  ? ?DG Chest Portable 1 View ? ?Result Date: 09/28/2021 ?CLINICAL DATA:  77 year old female with history of chest pain. EXAM: PORTABLE CHEST 1 VIEW COMPARISON:  Chest x-ray 12/20/2014. FINDINGS: Right internal jugular single-lumen porta cath with tip terminating at the superior cavoatrial junction. Left-sided pacemaker/AICD with lead tips projecting over the expected location of the right atrium and right ventricle. Lung volumes are normal. No consolidative airspace disease. No pleural effusions. No pneumothorax. No evidence of pulmonary edema. Heart size is mildly enlarged. Upper mediastinal contours are within normal limits. Atherosclerotic calcifications in the thoracic aorta. IMPRESSION: 1. No  radiographic evidence of acute cardiopulmonary disease. 2. Cardiomegaly. 3. Aortic atherosclerosis. 4. Support apparatus, as above. Electronically Signed   By: Vinnie Langton M.D.   On: 09/28/2021 05:3

## 2021-10-03 NOTE — Progress Notes (Signed)
Patient complained of stomach pain. Tylenol given ?

## 2021-10-03 NOTE — Progress Notes (Signed)
[name] ?MRN: [id number] ?DOB: 09-30-1945 ? ? ?Cancelled Treatment:    Reason Eval/Treat Not Completed: Other (comment).  Pt is leaving imminently, no further PT needed. ? ? ?[name] ?10/03/2021, 3:24 PM ? ?Mee Hives, PT PhD ?Acute Rehab Dept. Number: [facility] [phone] and [place] [phone] ? ?

## 2021-10-03 NOTE — Progress Notes (Signed)
Wound dressing complete. ?

## 2021-11-08 ENCOUNTER — Emergency Department: Payer: Medicare Other

## 2021-11-08 ENCOUNTER — Encounter (INDEPENDENT_AMBULATORY_CARE_PROVIDER_SITE_OTHER): Payer: Self-pay

## 2021-11-08 ENCOUNTER — Other Ambulatory Visit: Payer: Self-pay

## 2021-11-08 ENCOUNTER — Encounter: Payer: Self-pay | Admitting: Emergency Medicine

## 2021-11-08 DIAGNOSIS — K219 Gastro-esophageal reflux disease without esophagitis: Secondary | ICD-10-CM | POA: Insufficient documentation

## 2021-11-08 DIAGNOSIS — N390 Urinary tract infection, site not specified: Principal | ICD-10-CM | POA: Insufficient documentation

## 2021-11-08 DIAGNOSIS — R42 Dizziness and giddiness: Secondary | ICD-10-CM | POA: Insufficient documentation

## 2021-11-08 DIAGNOSIS — I10 Essential (primary) hypertension: Secondary | ICD-10-CM | POA: Diagnosis not present

## 2021-11-08 DIAGNOSIS — I2510 Atherosclerotic heart disease of native coronary artery without angina pectoris: Secondary | ICD-10-CM | POA: Insufficient documentation

## 2021-11-08 LAB — CBC
HCT: 37.4 % (ref 36.0–46.0)
Hemoglobin: 11.9 g/dL — ABNORMAL LOW (ref 12.0–15.0)
MCH: 29.6 pg (ref 26.0–34.0)
MCHC: 31.8 g/dL (ref 30.0–36.0)
MCV: 93 fL (ref 80.0–100.0)
Platelets: 96 10*3/uL — ABNORMAL LOW (ref 150–400)
RBC: 4.02 MIL/uL (ref 3.87–5.11)
RDW: 13.5 % (ref 11.5–15.5)
WBC: 5.6 10*3/uL (ref 4.0–10.5)
nRBC: 0 % (ref 0.0–0.2)

## 2021-11-08 LAB — BASIC METABOLIC PANEL
Anion gap: 9 (ref 5–15)
BUN: 14 mg/dL (ref 8–23)
CO2: 27 mmol/L (ref 22–32)
Calcium: 9.1 mg/dL (ref 8.9–10.3)
Chloride: 107 mmol/L (ref 98–111)
Creatinine, Ser: 0.87 mg/dL (ref 0.44–1.00)
GFR, Estimated: >60 mL/min (ref >60)
Glucose, Bld: 93 mg/dL (ref 70–99)
Potassium: 3.7 mmol/L (ref 3.5–5.1)
Sodium: 143 mmol/L (ref 135–145)

## 2021-11-08 LAB — CBG MONITORING, ED: Glucose-Capillary: 94 mg/dL (ref 70–99)

## 2021-11-08 NOTE — ED Provider Notes (Signed)
Triage nurse discussed case with me.  Advises family brought patient, she has had some element of confusion or slight change in behavior for about 2 days and now experiencing additional symptoms.  Possible hallucinations.  Will order labs to evaluate for altered mental status, nursing to enter.  I have entered a CT head without contrast.  Nursing advises NIH score of 0 at triage, patient alert at this time.   Delman Kitten, MD 11/08/21 2049

## 2021-11-08 NOTE — ED Triage Notes (Signed)
Pt presents to ER from home with son, who reports pt is more off balance today than usual, reports she usually is off balance of her left side, he does not recall any history of stroke in the past but does have a history of MI. Pt is awake alert, awake and oriented x 3.

## 2021-11-08 NOTE — ED Triage Notes (Signed)
Pt son providing information during triage and initial assessment. States that pt has been more off balance than normal and more confused for past 2-3 days. Pt denies any pain, headache, chest pain, or shortness of breath. States she just feels like " my head is going one way and my feet are going another." No neuro deficits noted in triage. Pt sitting unassited in wheelchair with NAD and was noted to transfer from POV to wheelchair upon arrival to dept without difficulty. Initial NIH performed in triage and is as documented. Assessment and triage discussed with Dr. Jacqualine Code with orders placed as indicated.

## 2021-11-09 ENCOUNTER — Encounter: Payer: Self-pay | Admitting: Radiology

## 2021-11-09 ENCOUNTER — Observation Stay
Admission: EM | Admit: 2021-11-09 | Discharge: 2021-11-10 | Disposition: A | Discharge status: Home / Self Care | Payer: Medicare Other | Attending: Internal Medicine | Admitting: Internal Medicine

## 2021-11-09 DIAGNOSIS — N390 Urinary tract infection, site not specified: Secondary | ICD-10-CM | POA: Diagnosis not present

## 2021-11-09 DIAGNOSIS — Z853 Personal history of malignant neoplasm of breast: Secondary | ICD-10-CM

## 2021-11-09 DIAGNOSIS — N3000 Acute cystitis without hematuria: Secondary | ICD-10-CM

## 2021-11-09 DIAGNOSIS — R42 Dizziness and giddiness: Secondary | ICD-10-CM | POA: Diagnosis not present

## 2021-11-09 DIAGNOSIS — K219 Gastro-esophageal reflux disease without esophagitis: Secondary | ICD-10-CM

## 2021-11-09 DIAGNOSIS — I2510 Atherosclerotic heart disease of native coronary artery without angina pectoris: Secondary | ICD-10-CM | POA: Diagnosis not present

## 2021-11-09 DIAGNOSIS — I10 Essential (primary) hypertension: Secondary | ICD-10-CM | POA: Diagnosis present

## 2021-11-09 DIAGNOSIS — N3001 Acute cystitis with hematuria: Secondary | ICD-10-CM

## 2021-11-09 LAB — PHOSPHORUS: Phosphorus: 3.6 mg/dL (ref 2.5–4.6)

## 2021-11-09 LAB — URINALYSIS, ROUTINE W REFLEX MICROSCOPIC
Bilirubin Urine: NEGATIVE
Glucose, UA: NEGATIVE mg/dL
Ketones, ur: NEGATIVE mg/dL
Nitrite: NEGATIVE
Protein, ur: 30 mg/dL — AB
Specific Gravity, Urine: 1.021 (ref 1.005–1.030)
pH: 5 (ref 5.0–8.0)

## 2021-11-09 LAB — BASIC METABOLIC PANEL
Anion gap: 7 (ref 5–15)
BUN: 12 mg/dL (ref 8–23)
CO2: 25 mmol/L (ref 22–32)
Calcium: 8.2 mg/dL — ABNORMAL LOW (ref 8.9–10.3)
Chloride: 107 mmol/L (ref 98–111)
Creatinine, Ser: 0.67 mg/dL (ref 0.44–1.00)
GFR, Estimated: >60 mL/min (ref >60)
Glucose, Bld: 86 mg/dL (ref 70–99)
Potassium: 3.3 mmol/L — ABNORMAL LOW (ref 3.5–5.1)
Sodium: 139 mmol/L (ref 135–145)

## 2021-11-09 LAB — PROCALCITONIN: Procalcitonin: <0.1 ng/mL

## 2021-11-09 LAB — CBC
HCT: 32.7 % — ABNORMAL LOW (ref 36.0–46.0)
Hemoglobin: 10.7 g/dL — ABNORMAL LOW (ref 12.0–15.0)
MCH: 29.7 pg (ref 26.0–34.0)
MCHC: 32.7 g/dL (ref 30.0–36.0)
MCV: 90.8 fL (ref 80.0–100.0)
Platelets: 80 10*3/uL — ABNORMAL LOW (ref 150–400)
RBC: 3.6 MIL/uL — ABNORMAL LOW (ref 3.87–5.11)
RDW: 13.2 % (ref 11.5–15.5)
WBC: 4.7 10*3/uL (ref 4.0–10.5)
nRBC: 0 % (ref 0.0–0.2)

## 2021-11-09 LAB — MAGNESIUM: Magnesium: 2.3 mg/dL (ref 1.7–2.4)

## 2021-11-09 MED ORDER — ACETAMINOPHEN 650 MG RE SUPP: 650.0000 mg | Freq: Four times a day (QID) | RECTAL | Status: DC | PRN

## 2021-11-09 MED ORDER — ALBUTEROL SULFATE (2.5 MG/3ML) 0.083% IN NEBU: 2.5000 mg | INHALATION_SOLUTION | Freq: Four times a day (QID) | RESPIRATORY_TRACT | Status: DC | PRN

## 2021-11-09 MED ORDER — BISACODYL 5 MG PO TBEC: 5.0000 mg | DELAYED_RELEASE_TABLET | Freq: Every day | ORAL | Status: DC | PRN

## 2021-11-09 MED ORDER — ONDANSETRON HCL 4 MG PO TABS: 4.0000 mg | ORAL_TABLET | Freq: Four times a day (QID) | ORAL | Status: DC | PRN

## 2021-11-09 MED ORDER — PANTOPRAZOLE SODIUM 40 MG PO TBEC
40.0000 mg | DELAYED_RELEASE_TABLET | Freq: Every day | ORAL | Status: DC
  Administered 2021-11-09 – 2021-11-10 (×2): 40 mg via ORAL
  Filled 2021-11-09 (×2): qty 1

## 2021-11-09 MED ORDER — IRBESARTAN 150 MG PO TABS
300.0000 mg | ORAL_TABLET | Freq: Every day | ORAL | Status: DC
Start: 2021-11-09 — End: 2021-11-10
  Administered 2021-11-09 – 2021-11-10 (×2): 300 mg via ORAL
  Filled 2021-11-09 (×2): qty 2

## 2021-11-09 MED ORDER — POLYETHYLENE GLYCOL 3350 17 G PO PACK: 17.0000 g | PACK | Freq: Every day | ORAL | Status: DC | PRN

## 2021-11-09 MED ORDER — ONDANSETRON HCL 4 MG/2ML IJ SOLN: 4.0000 mg | Freq: Four times a day (QID) | INTRAMUSCULAR | Status: DC | PRN

## 2021-11-09 MED ORDER — POTASSIUM CHLORIDE CRYS ER 20 MEQ PO TBCR
40.0000 meq | EXTENDED_RELEASE_TABLET | Freq: Once | ORAL | Status: AC
  Administered 2021-11-09: 40 meq via ORAL
  Filled 2021-11-09: qty 2

## 2021-11-09 MED ORDER — TRAMADOL HCL 50 MG PO TABS
25.0000 mg | ORAL_TABLET | Freq: Two times a day (BID) | ORAL | Status: DC
  Administered 2021-11-09 – 2021-11-10 (×4): 25 mg via ORAL
  Filled 2021-11-09 (×4): qty 1

## 2021-11-09 MED ORDER — ISOSORBIDE MONONITRATE ER 30 MG PO TB24
30.0000 mg | ORAL_TABLET | Freq: Every day | ORAL | Status: DC
  Administered 2021-11-09 – 2021-11-10 (×2): 30 mg via ORAL
  Filled 2021-11-09 (×2): qty 1

## 2021-11-09 MED ORDER — MAGNESIUM HYDROXIDE 400 MG/5ML PO SUSP: 30.0000 mL | Freq: Every day | ORAL | Status: DC | PRN

## 2021-11-09 MED ORDER — ROSUVASTATIN CALCIUM 10 MG PO TABS
10.0000 mg | ORAL_TABLET | Freq: Every day | ORAL | Status: DC
  Administered 2021-11-09 – 2021-11-10 (×2): 10 mg via ORAL
  Filled 2021-11-09 (×2): qty 1

## 2021-11-09 MED ORDER — ACETAMINOPHEN 325 MG PO TABS: 650.0000 mg | ORAL_TABLET | Freq: Four times a day (QID) | ORAL | Status: DC | PRN

## 2021-11-09 MED ORDER — CARVEDILOL 25 MG PO TABS
25.0000 mg | ORAL_TABLET | Freq: Two times a day (BID) | ORAL | Status: DC
  Administered 2021-11-09 – 2021-11-10 (×4): 25 mg via ORAL
  Filled 2021-11-09 (×4): qty 1

## 2021-11-09 MED ORDER — ASPIRIN 81 MG PO TBEC
81.0000 mg | DELAYED_RELEASE_TABLET | Freq: Every day | ORAL | Status: DC
  Administered 2021-11-09 – 2021-11-10 (×2): 81 mg via ORAL
  Filled 2021-11-09 (×2): qty 1

## 2021-11-09 MED ORDER — CLOPIDOGREL BISULFATE 75 MG PO TABS
75.0000 mg | ORAL_TABLET | Freq: Every day | ORAL | Status: DC
  Administered 2021-11-09 – 2021-11-10 (×2): 75 mg via ORAL
  Filled 2021-11-09 (×2): qty 1

## 2021-11-09 MED ORDER — TRAZODONE HCL 50 MG PO TABS: 25.0000 mg | ORAL_TABLET | Freq: Every evening | ORAL | Status: DC | PRN

## 2021-11-09 MED ORDER — HYDROXYZINE HCL 50 MG PO TABS
25.0000 mg | ORAL_TABLET | Freq: Three times a day (TID) | ORAL | Status: DC
  Administered 2021-11-09 – 2021-11-10 (×5): 25 mg via ORAL
  Filled 2021-11-09 (×5): qty 1

## 2021-11-09 MED ORDER — LORAZEPAM 0.5 MG PO TABS
0.2500 mg | ORAL_TABLET | Freq: Two times a day (BID) | ORAL | Status: DC
  Administered 2021-11-09 – 2021-11-10 (×4): 0.25 mg via ORAL
  Filled 2021-11-09 (×4): qty 1

## 2021-11-09 MED ORDER — ENOXAPARIN SODIUM 40 MG/0.4ML IJ SOSY
40.0000 mg | PREFILLED_SYRINGE | INTRAMUSCULAR | Status: DC
  Administered 2021-11-09 – 2021-11-10 (×2): 40 mg via SUBCUTANEOUS
  Filled 2021-11-09 (×2): qty 0.4

## 2021-11-09 MED ORDER — SODIUM CHLORIDE 0.9 % IV SOLN: 1.0000 g | Freq: Once | INTRAVENOUS | Status: DC

## 2021-11-09 MED ORDER — SODIUM CHLORIDE 0.9 % IV SOLN
1.0000 g | INTRAVENOUS | Status: DC
  Administered 2021-11-09 – 2021-11-10 (×2): 1 g via INTRAVENOUS
  Filled 2021-11-09 (×2): qty 10
  Filled 2021-11-09: qty 1

## 2021-11-09 MED ORDER — SODIUM CHLORIDE 0.9 % IV SOLN: INTRAVENOUS | Status: DC

## 2021-11-09 MED ORDER — MECLIZINE HCL 25 MG PO TABS
12.5000 mg | ORAL_TABLET | Freq: Three times a day (TID) | ORAL | Status: DC | PRN
Start: 2021-11-09 — End: 2021-11-10

## 2021-11-09 NOTE — Progress Notes (Signed)
Patient arrived form ED on stretcher. Alert and oriented to person and place. Forgetful at times. Patient settled into room, assessment completed. Son at bedside. Will continue to monitor as per orders.

## 2021-11-09 NOTE — ED Provider Notes (Signed)
[facility] Provider Note    Event Date/Time   First MD Initiated Contact with Patient 11/09/21 [phone]     (approximate)   History   Dizziness   HPI  Christine [name] is a 76 y.o. female who presents to the ED for evaluation of Dizziness   I reviewed 4/20 medical DC summary where patient was admitted for chest pain.  She has a history of CAD s/p stenting and AICD placement.  CHF, breast cancer and anxiety.  She was started on Imdur, noted to have cognitive decline and no other inpatient cardiac work-up.  She is on DAPT with Plavix  Son brings patient to the ED for evaluation of dizziness, leaning towards the left and increased forgetfulness.  He reports symptoms have been worsening "for a while" and has trouble defining a timeframe.  Patient reports that she is here to get checked out and seems uncertain why she is even here.  She lives at home with her husband.  No apparent falls or injuries.  Physical Exam   Triage Vital Signs: ED Triage Vitals  Enc Vitals Group     BP 11/08/21 2050 (!) 141/79     Pulse Rate 11/08/21 2050 (!) 57     Resp 11/08/21 2050 16     Temp 11/08/21 2050 97.9 F (36.6 C)     Temp Source 11/08/21 2050 Oral     SpO2 11/08/21 2050 98 %     Weight 11/08/21 2043 112 lb (50.8 kg)     Height 11/08/21 2043 '5\' 3"'$  (1.6 m)     Head Circumference --      Peak Flow --      Pain Score 11/08/21 2043 0     Pain Loc --      Pain Edu? --      Excl. in [place]? --     Most recent vital signs: Vitals:   11/09/21 0100 11/09/21 0130  BP: (!) 174/88 (!) 175/88  Pulse: 60 (!) 57  Resp: 17 18  Temp:    SpO2: 100% 99%    General: Awake, no distress.  Pleasantly disoriented. CV:  Good peripheral perfusion.  Resp:  Normal effort.  Abd:  No distention.  Suprapubic tenderness is noted. MSK:  No deformity noted.  Neuro:  No focal deficits appreciated. Cranial nerves II through XII intact 5/5 strength and sensation in all 4  extremities Other:  Ulcerative wound to the inferior and medial margin of her pacemaker without any erosion of the pacemaker through the skin.  No fluctuance or purulence, but the inferior margin of this wound is indurated.  Has Band-Aid over top it with crusting.   ED Results / Procedures / Treatments   Labs (all labs ordered are listed, but only abnormal results are displayed) Labs Reviewed  CBC - Abnormal; Notable for the following components:      Result Value   Hemoglobin 11.9 (*)    Platelets 96 (*)    All other components within normal limits  URINALYSIS, ROUTINE W REFLEX MICROSCOPIC - Abnormal; Notable for the following components:   Color, Urine YELLOW (*)    APPearance HAZY (*)    Hgb urine dipstick MODERATE (*)    Protein, ur 30 (*)    Leukocytes,Ua SMALL (*)    Bacteria, UA RARE (*)    All other components within normal limits  URINE CULTURE  BASIC METABOLIC PANEL  PROCALCITONIN  BASIC METABOLIC PANEL  CBC  CBG MONITORING, ED  EKG Poor quality EKG seems to demonstrate a paced rhythm with a rate of 63 bpm, leftward axis and QTc 452.  No STEMI  RADIOLOGY CT head interpreted by me without evidence of acute intracranial pathology  Official radiology report(s): CT Head Wo Contrast  Result Date: 11/08/2021 CLINICAL DATA:  Delirium EXAM: CT HEAD WITHOUT CONTRAST TECHNIQUE: Contiguous axial images were obtained from the base of the skull through the vertex without intravenous contrast. RADIATION DOSE REDUCTION: This exam was performed according to the departmental dose-optimization program which includes automated exposure control, adjustment of the mA and/or kV according to patient size and/or use of iterative reconstruction technique. COMPARISON:  09/28/2021 FINDINGS: Brain: There is atrophy and chronic small vessel disease changes. No acute intracranial abnormality. Specifically, no hemorrhage, hydrocephalus, mass lesion, acute infarction, or significant intracranial  injury. Vascular: No hyperdense vessel or unexpected calcification. Skull: No acute calvarial abnormality. Sinuses/Orbits: No acute findings Other: None IMPRESSION: Atrophy, chronic microvascular disease. No acute intracranial abnormality. Electronically Signed   By: Rolm Baptise M.D.   On: 11/08/2021 21:07    PROCEDURES and INTERVENTIONS:  .1-3 Lead EKG Interpretation Performed by: Vladimir Crofts, MD Authorized by: Vladimir Crofts, MD     Interpretation: normal     ECG rate:  60   ECG rate assessment: normal     Ectopy: none     Conduction: normal    Medications  cefTRIAXone (ROCEPHIN) 1 g in sodium chloride 0.9 % 100 mL IVPB (has no administration in time range)  aspirin EC tablet 81 mg (has no administration in time range)  traMADol (ULTRAM) tablet 25 mg (has no administration in time range)  carvedilol (COREG) tablet 25 mg (has no administration in time range)  isosorbide mononitrate (IMDUR) 24 hr tablet 30 mg (has no administration in time range)  rosuvastatin (CRESTOR) tablet 10 mg (has no administration in time range)  irbesartan (AVAPRO) tablet 300 mg (has no administration in time range)  LORazepam (ATIVAN) tablet 0.25 mg (has no administration in time range)  hydrOXYzine (ATARAX) tablet 25 mg (has no administration in time range)  bisacodyl (DULCOLAX) EC tablet 5 mg (has no administration in time range)  pantoprazole (PROTONIX) EC tablet 40 mg (has no administration in time range)  polyethylene glycol (MIRALAX / GLYCOLAX) packet 17 g (has no administration in time range)  clopidogrel (PLAVIX) tablet 75 mg (has no administration in time range)  albuterol (PROVENTIL) (2.5 MG/3ML) 0.083% nebulizer solution 2.5 mg (has no administration in time range)  cefTRIAXone (ROCEPHIN) 1 g in sodium chloride 0.9 % 100 mL IVPB (has no administration in time range)  enoxaparin (LOVENOX) injection 40 mg (has no administration in time range)  0.9 %  sodium chloride infusion (has no administration in  time range)  acetaminophen (TYLENOL) tablet 650 mg (has no administration in time range)    Or  acetaminophen (TYLENOL) suppository 650 mg (has no administration in time range)  traZODone (DESYREL) tablet 25 mg (has no administration in time range)  magnesium hydroxide (MILK OF MAGNESIA) suspension 30 mL (has no administration in time range)  ondansetron (ZOFRAN) tablet 4 mg (has no administration in time range)    Or  ondansetron (ZOFRAN) injection 4 mg (has no administration in time range)     IMPRESSION / MDM / ASSESSMENT AND PLAN / ED COURSE  I reviewed the triage vital signs and the nursing notes.  Differential diagnosis includes, but is not limited to, stroke, dementia, metabolic encephalopathy, cystitis, cellulitis  {Patient presents with symptoms of  an acute illness or injury that is potentially life-threatening.  76 year old female presents to the ED with dizziness and increased weakness from her baseline, possibly metabolic in the setting of acute cystitis.  She also has this ulcerative wound on her chest with an area of induration concerning for cellulitis.  No purulence, fluctuance or signs of pacemaker eroding through the skin.  No evidence of sepsis.  Normal CBC, metabolic panel.  We will send her urine for culture and start ceftriaxone.  Consult with medicine for admission  Clinical Course as of 11/09/21 0306  Sat Nov 09, 2021  0239 Reassessed and discussed my concerns with patient and son.  They are agreeable to stay. [DS]    Clinical Course User Index [DS] Vladimir Crofts, MD     FINAL CLINICAL IMPRESSION(S) / ED DIAGNOSES   Final diagnoses:  Dizziness  Acute cystitis with hematuria     Rx / DC Orders   ED Discharge Orders     None        Note:  This document was prepared using Dragon voice recognition software and may include unintentional dictation errors.   Vladimir Crofts, MD 11/09/21 [phone]

## 2021-11-09 NOTE — H&P (Addendum)
[place]   PATIENT NAME: Christine [name]    MR#:  [id number]  DATE OF BIRTH:  19-Jan-1946  DATE OF ADMISSION:  11/09/2021  PRIMARY CARE PHYSICIAN: Baxter Hire, MD   Patient is coming from: Home  REQUESTING/REFERRING PHYSICIAN: Rabel,Dylan, MD  CHIEF COMPLAINT:   Chief Complaint  Patient presents with   Dizziness    HISTORY OF PRESENT ILLNESS:  Christine [name] is a 76 y.o. Caucasian female with medical history significant for anxiety, breast cancer, chronic systolic CHF, coronary artery disease, CAD s/p PCI and stent and AICD placement GERD, hypertension, dyslipidemia,osteoarthritis, presented to the emergency room with acute onset of dizziness with leaning towards the left and increased forgetfulness.  Symptoms of been going on for a while.  She has a left chest wall wound is more erythematous as well as a mild ulcerating lesion on her mid chest.  No dyspnea or cough or wheezing.  No nausea or vomiting or abdominal pain.  No dysuria, oliguria or hematuria or flank pain. . ED Course: Upon presentation to the emergency room BP was 141/79 and later 172/87 with a heart rate of 57 with otherwise normal vital signs.  Labs revealed normal BMP and CBC showed hemoglobin 11.9 with hematocrit 37.4 and platelets 96.  UA was positive for UTI. EKG as reviewed by me : EKG showed junctional rhythm and occasional PVCs, with a rate of 63, left axis deviation, low voltage QRS. Imaging: Noncontrasted head CT scan showed atrophy with chronic microvascular disease with no acute intracranial abnormality.  The patient was given 1 g of IV Rocephin.  She will be admitted to the medical bed for further evaluation and management. PAST MEDICAL HISTORY:   Past Medical History:  Diagnosis Date   Allergic rhinitis    Anxiety    Breast cancer ([place])    11/26/20 Pt reports wound/lesion on left breast   Chronic systolic heart failure ([place])    Coronary artery disease    GERD (gastroesophageal reflux  disease)    Heart murmur    Hyperlipidemia    Hypertension    Implantable defibrillator [employer]    DOI 2006   MI (myocardial infarction) ([place])    Osteoarthritis    Wears dentures    full upper and lower    PAST SURGICAL HISTORY:   Past Surgical History:  Procedure Laterality Date   CHF exacerbation  10/07   CORONARY STENT PLACEMENT  8/06   3 stents; myocardial infarction   LOWER EXTREMITY ANGIOGRAPHY Left 02/15/2019   Procedure: LOWER EXTREMITY ANGIOGRAPHY;  Surgeon: Katha Cabal, MD;  Location: [facility];  Service: Cardiovascular;  Laterality: Left;   NM MYOVIEW LTD  1/08   EF 39%, no sx ischemia   PACEMAKER PLACEMENT  2/07   Defibrillator   RENAL ARTERY STENT Left     SOCIAL HISTORY:   Social History   Tobacco Use   Smoking status: Every Day    Packs/day: 0.50    Years: 12.00    Pack years: 6.00    Types: Cigarettes   Smokeless tobacco: Never   Tobacco comments:       Substance Use Topics   Alcohol use: No    Comment: 2-3 times per month    FAMILY HISTORY:   Family History  Problem Relation Age of Onset   Stroke Mother 3   Heart attack Father    Coronary artery disease Father    Heart attack Brother  Coronary artery disease Other    Hypertension Other    Cancer Neg Hx        No breast or colon cancer    DRUG ALLERGIES:   Allergies  Allergen Reactions   Atorvastatin Other (See Comments)    increased LFT's   Lisinopril Cough   Oxycodone Hcl Itching and Other (See Comments)    Restless & jittery   Simvastatin Other (See Comments)    myalgias    REVIEW OF SYSTEMS:   ROS As per history of present illness. All pertinent systems were reviewed above. Constitutional, HEENT, cardiovascular, respiratory, GI, GU, musculoskeletal, neuro, psychiatric, endocrine, integumentary and hematologic systems were reviewed and are otherwise negative/unremarkable except for positive findings mentioned above in the HPI.   MEDICATIONS AT HOME:    Prior to Admission medications   Medication Sig Start Date End Date Taking? Authorizing Provider  albuterol (VENTOLIN HFA) 108 (90 Base) MCG/ACT inhaler Inhale 2 puffs into the lungs 4 (four) times daily as needed (wheezing/shortness of breath). Patient not taking: Reported on 09/28/2021 03/31/17   [provider]  aspirin EC 81 MG tablet Take 81 mg by mouth daily.    [provider]  bisacodyl (DULCOLAX) 5 MG EC tablet Take 5 mg by mouth daily as needed for mild constipation or moderate constipation.    [provider]  carvedilol (COREG) 25 MG tablet TAKE 1 TABLET BY MOUTH TWICE A DAY Patient taking differently: Take 25 mg by mouth 2 (two) times daily with a meal. 09/09/10   Venia Carbon, MD  clopidogrel (PLAVIX) 75 MG tablet TAKE 1 TABLET BY MOUTH ONCE DAILY Patient taking differently: Take 75 mg by mouth daily. 12/05/19   Schnier, Dolores Lory, MD  furosemide (LASIX) 40 MG tablet Take 40 mg by mouth daily as needed (fluid retention/swelling). Patient not taking: Reported on 09/28/2021    [provider]  hydrOXYzine (ATARAX) 25 MG tablet Take 25 mg by mouth 3 (three) times daily. 09/04/21   [provider]  isosorbide mononitrate (IMDUR) 30 MG 24 hr tablet Take 1 tablet (30 mg total) by mouth daily. 10/04/21 11/03/21  Wyvonnia Dusky, MD  LORazepam (ATIVAN) 0.5 MG tablet Take 0.25 mg by mouth 2 (two) times daily.  03/13/17   [provider]  pantoprazole (PROTONIX) 40 MG tablet Take 40 mg by mouth daily.  02/25/17   [provider]  Polyethylene Glycol 3350 (PEG 3350) POWD Take 17 g by mouth daily as needed (constipation.).     [provider]  rosuvastatin (CRESTOR) 10 MG tablet Take 10 mg by mouth daily.     [provider]  tamoxifen (NOLVADEX) 20 MG tablet Take 1 tablet (20 mg total) by mouth daily. 12/29/18   Lloyd Huger, MD  tetrahydrozoline 0.05 % ophthalmic solution Place 1 drop into both eyes 3  (three) times daily as needed (dry/irritated eyes). Patient not taking: Reported on 09/28/2021    [provider]  traMADol (ULTRAM) 50 MG tablet Take 25 mg by mouth 2 (two) times daily.    [provider]  valsartan (DIOVAN) 320 MG tablet Take 320 mg by mouth daily at 12 noon.     [provider]  venlafaxine XR (EFFEXOR-XR) 37.5 MG 24 hr capsule Take 37.5 mg by mouth daily. Patient not taking: Reported on 09/28/2021 01/23/20   [provider]      VITAL SIGNS:  Blood pressure (!) 177/84, pulse (!) 56, temperature 97.7 F (36.5 C), resp.  rate 16, height '5\' 3"'$  (1.6 m), weight 36.8 kg, SpO2 99 %.  PHYSICAL EXAMINATION:  Physical Exam  GENERAL:  76 y.o.-year-old Caucasian female patient lying in the bed with no acute distress.  EYES: Pupils equal, round, reactive to light and accommodation. No scleral icterus. Extraocular muscles intact.  HEENT: Head atraumatic, normocephalic. Oropharynx and nasopharynx clear.  NECK:  Supple, no jugular venous distention. No thyroid enlargement, no tenderness.  LUNGS: Normal breath sounds bilaterally, no wheezing, rales,rhonchi or crepitation. No use of accessory muscles of respiration.  CARDIOVASCULAR: Regular rate and rhythm, S1, S2 normal. No murmurs, rubs, or gallops.  ABDOMEN: Soft, nondistended, nontender. Bowel sounds present. No organomegaly or mass.  EXTREMITIES: No pedal edema, cyanosis, or clubbing.  NEUROLOGIC: Cranial nerves II through XII are intact. Muscle strength 5/5 in all extremities. Sensation intact. Gait not checked.  PSYCHIATRIC: The patient is alert and oriented x 3.  Normal affect and good eye contact. SKIN: Left chest wall erythematous wound inferior and medial to her pacemaker and upper healed ulcerative lesion with minimal scabs.    LABORATORY PANEL:   CBC Recent Labs  Lab 11/08/21 2051  WBC 5.6  HGB 11.9*  HCT 37.4  PLT 96*    ------------------------------------------------------------------------------------------------------------------  Chemistries  Recent Labs  Lab 11/08/21 2051  NA 143  K 3.7  CL 107  CO2 27  GLUCOSE 93  BUN 14  CREATININE 0.87  CALCIUM 9.1   ------------------------------------------------------------------------------------------------------------------  Cardiac Enzymes No results for input(s): TROPONINI in the last 168 hours. ------------------------------------------------------------------------------------------------------------------  RADIOLOGY:  CT Head Wo Contrast  Result Date: 11/08/2021 CLINICAL DATA:  Delirium EXAM: CT HEAD WITHOUT CONTRAST TECHNIQUE: Contiguous axial images were obtained from the base of the skull through the vertex without intravenous contrast. RADIATION DOSE REDUCTION: This exam was performed according to the departmental dose-optimization program which includes automated exposure control, adjustment of the mA and/or kV according to patient size and/or use of iterative reconstruction technique. COMPARISON:  09/28/2021 FINDINGS: Brain: There is atrophy and chronic small vessel disease changes. No acute intracranial abnormality. Specifically, no hemorrhage, hydrocephalus, mass lesion, acute infarction, or significant intracranial injury. Vascular: No hyperdense vessel or unexpected calcification. Skull: No acute calvarial abnormality. Sinuses/Orbits: No acute findings Other: None IMPRESSION: Atrophy, chronic microvascular disease. No acute intracranial abnormality. Electronically Signed   By: Rolm Baptise M.D.   On: 11/08/2021 21:07      IMPRESSION AND PLAN:  Assessment and Plan: * UTI (urinary tract infection) - The patient will be admitted to a medical bed. - We will continue IV Rocephin. - We will follow urine culture and sensitivity. - This could be contributing to her dizziness.  Dizziness - PT consult will be obtained. - Neurochecks will  be followed every 4 hours for 24 hours.. - We will place her on as needed Antivert.  GERD without esophagitis - We will continue PPI therapy.  History of breast cancer - We will continue tamoxifen.  Coronary artery disease without angina pectoris - We will continue aspirin and Plavix as well as beta-blocker therapy, statin therapy and Imdur.  Essential hypertension - We will continue Coreg and Diovan.   DVT prophylaxis: Lovenox.  Advanced Care Planning:  Code Status: full code.  Family Communication:  The plan of care was discussed in details with the patient (and family). I answered all questions. The patient agreed to proceed with the above mentioned plan. Further management will depend upon hospital course. Disposition Plan: Back to previous home environment Consults called: none. All the  records are reviewed and case discussed with ED provider.  Status is: Inpatient  At the time of the admission, it appears that the appropriate admission status for this patient is inpatient.  This is judged to be reasonable and necessary in order to provide the required intensity of service to ensure the patient's safety given the presenting symptoms, physical exam findings and initial radiographic and laboratory data in the context of comorbid conditions.  The patient requires inpatient status due to high intensity of service, high risk of further deterioration and high frequency of surveillance required.  I certify that at the time of admission, it is my clinical judgment that the patient will require inpatient hospital care extending more than 2 midnights.                            Dispo: The patient is from: Home              Anticipated d/c is to: Home              Patient currently is not medically stable to d/c.              Difficult to place patient: No  Christel Mormon M.D on 11/09/2021 at 6:07 AM  [facility]   From 7 PM-7 AM, contact night-coverage [email or web address]  CC:  Primary care physician; Baxter Hire, MD

## 2021-11-09 NOTE — Assessment & Plan Note (Addendum)
-   PT consult will be obtained. - Neurochecks will be followed every 4 hours for 24 hours.. - We will place her on as needed Antivert.

## 2021-11-09 NOTE — TOC CM/SW Note (Signed)
Attempted call to son [name] for high risk assessment and to discuss recs for 24 hour supervision at home. No answer and unable to leave a VM. Will continue to try to reach [place].  Oleh Genin, [place]

## 2021-11-09 NOTE — Assessment & Plan Note (Signed)
-   We will continue aspirin and Plavix as well as beta-blocker therapy, statin therapy and Imdur.

## 2021-11-09 NOTE — Assessment & Plan Note (Signed)
-   The patient will be admitted to a medical bed. - We will continue IV Rocephin. - We will follow urine culture and sensitivity. - This could be contributing to her dizziness.

## 2021-11-09 NOTE — Assessment & Plan Note (Signed)
-   We will continue Coreg and Diovan.

## 2021-11-09 NOTE — Evaluation (Signed)
Physical Therapy Evaluation Patient Details Name: Christine [name] MRN: [id number] DOB: [birth year] Today's Date: 11/09/2021  History of Present Illness  Pt is a 76 y/o F admitted on 11/09/21 after presenting to the ED with c/o acute onset dizziness with leaning L & increased forgetfulness. Head CT was negative for acute changes. Pt is being treated for UTI. PMH: anxiety, breast CA, chronic systolic CHF, CAD, s/p PCI & stent & AICD placement, GERD, HTN, dyslipidemia, OA, MI  Clinical Impression  Pt seen for PT evaluation with pt agreeable. Pt is only oriented to self & state at this time. Pt demonstrates very poor safety awareness, intellectual awareness, & recall. Pt is able to perform all functional mobility tasks without physical assistance but requires supervision 2/2 impaired cognition. From a mobility perspective pt does not require further acute PT services, but pt has significantly impaired cognition & this PT recommends 24/7 supervision upon d/c for safety - team & social worker made aware. PT to sign off at this time, please re-consult of new needs arise.   Recommendations for follow up therapy are one component of a multi-disciplinary discharge planning process, led by the attending physician.  Recommendations may be updated based on patient status, additional functional criteria and insurance authorization.  Follow Up Recommendations No PT follow up    Assistance Recommended at Discharge Frequent or constant Supervision/Assistance  Patient can return home with the following  Assistance with cooking/housework;Assistance with feeding;Assist for transportation;Direct supervision/assist for financial management;Help with stairs or ramp for entrance;Direct supervision/assist for medications management    Equipment Recommendations None recommended by PT  Recommendations for Other Services       Functional Status Assessment Patient has not had a recent decline in their functional status      Precautions / Restrictions Precautions Precautions: Fall Restrictions Weight Bearing Restrictions: No      Mobility  Bed Mobility Overal bed mobility: Modified Independent             General bed mobility comments: HOB elevated    Transfers Overall transfer level: Independent Equipment used: None Transfers: Sit to/from Stand Sit to Stand: Independent           General transfer comment: STS from EOB & toilet    Ambulation/Gait Ambulation/Gait assistance: Independent Gait Distance (Feet): 250 Feet Assistive device: None Gait Pattern/deviations: Decreased step length - right, Decreased step length - left, Decreased stride length Gait velocity: decreased        Stairs Stairs: Yes Stairs assistance: Modified independent (Device/Increase time) Stair Management: Two rails Number of Stairs: 4 General stair comments: reciprocal pattern to ascend, step-to pattern to descend  Wheelchair Mobility    Modified Rankin (Stroke Patients Only)       Balance Overall balance assessment: Needs assistance   Sitting balance-Leahy Scale: Normal Sitting balance - Comments: toilets & performs peri hygiene without assistance   Standing balance support: No upper extremity supported, During functional activity Standing balance-Leahy Scale: Good                               Pertinent Vitals/Pain Pain Assessment Pain Assessment: No/denies pain    Home Living Family/patient expects to be discharged to:: Private residence Living Arrangements: Alone Available Help at Discharge:  (unsure) Type of Home: Apartment Home Access:  (pt reports no steps but chart states 3-4 steps to enter)       Home Layout: One level Home Equipment: None  Prior Function Prior Level of Function : Independent/Modified Independent             Mobility Comments: Pt is a questionable historian but reports she lives alone, ambulatory without AD. 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     Hand Dominance   Dominant Hand: Right    Extremity/Trunk Assessment   Upper Extremity Assessment Upper Extremity Assessment: Overall WFL for tasks assessed    Lower Extremity Assessment Lower Extremity Assessment: Overall WFL for tasks assessed    Cervical / Trunk Assessment Cervical / Trunk Assessment: Normal  Communication   Communication: HOH (slight HOH)  Cognition Arousal/Alertness: Awake/alert Behavior During Therapy: Flat affect Overall Cognitive Status: No family/caregiver present to determine baseline cognitive functioning Area of Impairment: Orientation, Attention, Memory, Following commands, Safety/judgement, Awareness, Problem solving                 Orientation Level: Disoriented to, Situation, Time, Place (able to correctly chose hospital from choice of 3 but later during session unable to state location, not oriented to year, oriented to state but not city)   Memory: Decreased recall of precautions, Decreased short-term memory Following Commands: Follows one step commands inconsistently Safety/Judgement: Decreased awareness of safety, Decreased awareness of deficits Awareness: Intellectual Problem Solving: Slow processing, Requires verbal cues, Requires tactile cues          General Comments General comments (skin integrity, edema, etc.): Pt with continent void, requires cuing & extra time to turn off faucet after hand hygiene. Pt engages in preparing peanut butter crackers to see if pt can fix herself meal & tolerate standing with pt able to do so with assistance to open crackers, no LOB in standing.    Exercises     Assessment/Plan    PT Assessment Patient does not need any further PT services  PT Problem List         PT Treatment  Interventions      PT Goals (Current goals can be found in the Care Plan section)  Acute Rehab PT Goals Patient Stated Goal: none stated Time For Goal Achievement: 11/23/21 Potential to Achieve Goals: Good    Frequency       Co-evaluation               AM-PAC PT "6 Clicks" Mobility  Outcome Measure Help needed turning from your back to your side while in a flat bed without using bedrails?: None Help needed moving from lying on your back to sitting on the side of a flat bed without using bedrails?: None Help needed moving to and from a bed to a chair (including a wheelchair)?: None Help needed standing up from a chair using your arms (e.g., wheelchair or bedside chair)?: None Help needed to walk in hospital room?: None Help needed climbing 3-5 steps with a railing? : None 6 Click Score: 24    End of Session   Activity Tolerance: Patient tolerated treatment well Patient left: in bed;with bed alarm set;with call bell/phone within reach        Time: 1351-1415 PT Time Calculation (min) (ACUTE ONLY): 24 min   Charges:   PT Evaluation $PT Eval Moderate Complexity: 1 Mod          Lavone Nian, PT, DPT 11/09/21, 2:31 PM   Waunita Schooner 11/09/2021, 2:29 PM

## 2021-11-09 NOTE — ED Notes (Signed)
Patient assisted to use bedside commode.  Steady gait noted at this time

## 2021-11-09 NOTE — Evaluation (Signed)
Occupational Therapy Evaluation Patient Details Name: Christine [name] MRN: [id number] DOB: 1945/10/21 Today's Date: 11/09/2021   History of Present Illness Pt is a 76 year old femle admitted with onset of dizziness with leaning towards the left and increased forgetfulness, UTI. Pt also has a left chest wall wound is more erythematous as well as a mild ulcerating lesion on her mid chest. Priormedical history significant for anxiety, breast cancer, chronic systolic CHF, coronary artery disease, CAD s/p PCI and stent and AICD placement GERD, hypertension, dyslipidemia,osteoarthritis.   Clinical Impression   Chart reviewed, pt greeted in bed, alert, oriented to self, place, grossly oriented to date/situation however ?understanding of situation. Pt performs bed mobility with MOD I, ADL amb and grooming at sink level with supervision. Pt presents with some mild generalized weakness throughout BUE. Pt with?cognition, despite current acute diagnosis, of note, pt admitted in 4/23 and SLUMS assessment administered with pt scoring 8/30. Pt scores 6/30 on this date. Pt with poor safety awareness ( pulling at IV when ambulating) and unable to state who will help her at discharge. Pt states she has increased difficulties completing ADL/IADL such as cooking and cleaning however then reports she will do it on her own following discharge. Recommend constant/frequent supervision following discharge. OT will follow acutely.      Recommendations for follow up therapy are one component of a multi-disciplinary discharge planning process, led by the attending physician.  Recommendations may be updated based on patient status, additional functional criteria and insurance authorization.   Follow Up Recommendations  Home health OT    Assistance Recommended at Discharge Frequent or constant Supervision/Assistance  Patient can return home with the following Direct supervision/assist for financial management;Direct  supervision/assist for medications management    Functional Status Assessment  Patient has had a recent decline in their functional status and demonstrates the ability to make significant improvements in function in a reasonable and predictable amount of time.  Equipment Recommendations  None recommended by OT    Recommendations for Other Services       Precautions / Restrictions Precautions Precautions: Fall Restrictions Weight Bearing Restrictions: No      Mobility Bed Mobility Overal bed mobility: Modified Independent                  Transfers Overall transfer level: Needs assistance   Transfers: Sit to/from Stand Sit to Stand: Supervision                  Balance Overall balance assessment: Needs assistance Sitting-balance support: Feet supported Sitting balance-Leahy Scale: Good       Standing balance-Leahy Scale: Fair Standing balance comment: pt reports feeling "off balance"                           ADL either performed or assessed with clinical judgement   ADL Overall ADL's : Needs assistance/impaired     Grooming: Wash/dry face;Oral care;Standing;Cueing for sequencing                   Toilet Transfer: Copy Details (indicate cue type and reason): simulated, no AD         Functional mobility during ADLs: Supervision/safety General ADL Comments: pt requires frequent vcs for attention to task     Vision Patient Visual Report:  (pt reporting seeing different objects/spots approx the last week) Additional Comments: Will contiue to assess     Perception  Praxis      Pertinent Vitals/Pain Pain Assessment Pain Assessment: No/denies pain     Hand Dominance Right   Extremity/Trunk Assessment Upper Extremity Assessment Upper Extremity Assessment: Generalized weakness   Lower Extremity Assessment Lower Extremity Assessment: Defer to PT evaluation;Generalized weakness    Cervical / Trunk Assessment Cervical / Trunk Assessment: Normal   Communication Communication Communication: No difficulties   Cognition Arousal/Alertness: Awake/alert Behavior During Therapy: Flat affect Overall Cognitive Status: No family/caregiver present to determine baseline cognitive functioning Area of Impairment: Orientation, Attention, Memory, Following commands, Safety/judgement, Awareness, Problem solving                 Orientation Level: Disoriented to, Situation Current Attention Level: Focused Memory: Decreased recall of precautions, Decreased short-term memory Following Commands: Follows one step commands inconsistently Safety/Judgement: Decreased awareness of safety, Decreased awareness of deficits Awareness: Intellectual Problem Solving: Slow processing, Requires verbal cues, Requires tactile cues General Comments: Pt participated in SLUMS assessment, socred 6/20 which puts her in the "dementia" category for scoring;  Pt reports she does her own med management however unable to describe how she organizes pills.     General Comments       Exercises     Shoulder Instructions      Home Living Family/patient expects to be discharged to:: Private residence Living Arrangements: Alone Available Help at Discharge: Family Type of Home: Apartment ("a place where there are lots of homes" unable to state apartment) Home Access: Stairs to enter     [place]: One level     Bathroom Shower/Tub: Aeronautical engineer: None          Prior Functioning/Environment Prior Level of Function : Independent/Modified Independent               ADLs Comments: Pt reports she is Indep with ADL, household and community amb without AD, cooking/cleaning/shopping however reports increased difficulties with tasks since previous admission, 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        OT Problem List: Decreased strength;Decreased activity  tolerance;Decreased cognition      OT Treatment/Interventions: Self-care/ADL training;DME and/or AE instruction;Balance training;Therapeutic activities;Patient/family education    OT Goals(Current goals can be found in the care plan section) Acute Rehab OT Goals Patient Stated Goal: go home OT Goal Formulation: With patient Time For Goal Achievement: 11/23/21 Potential to Achieve Goals: Good ADL Goals Pt Will Perform Grooming: with modified independence Pt Will Transfer to Toilet: with modified independence Pt Will Perform Toileting - Clothing Manipulation and hygiene: with modified independence Additional ADL Goal #1: Pt will improve safety at home as evidenced by performing medication management independently  OT Frequency: Min 2X/week    Co-evaluation              AM-PAC OT "6 Clicks" Daily Activity     Outcome Measure Help from another person eating meals?: None Help from another person taking care of personal grooming?: None Help from another person toileting, which includes using toliet, bedpan, or urinal?: None Help from another person bathing (including washing, rinsing, drying)?: A Little Help from another person to put on and taking off regular upper body clothing?: A Little Help from another person to put on and taking off regular lower body clothing?: A Little 6 Click Score: 21   End of Session Equipment Utilized During Treatment: Gait belt Nurse Communication: Mobility status  Activity Tolerance: Patient tolerated treatment well Patient left: in  bed;with call bell/phone within reach;with bed alarm set  OT Visit Diagnosis: Unsteadiness on feet (R26.81);Muscle weakness (generalized) (M62.81)                Time: 1916-6060 OT Time Calculation (min): 26 min Charges:  OT General Charges $OT Visit: 1 Visit OT Evaluation $OT Eval Low Complexity: 1 Low OT Treatments $Self Care/Home Management : 8-22 mins  Shanon Payor, OTD OTR/L  11/09/21, 1:14 PM

## 2021-11-09 NOTE — Assessment & Plan Note (Signed)
-   We will continue PPI therapy 

## 2021-11-09 NOTE — Plan of Care (Signed)
Care plan initiated.

## 2021-11-09 NOTE — Progress Notes (Addendum)
Progress Note    Christine [name]  YSA:[id number] DOB: 08/29/1945  DOA: 11/09/2021 PCP: Baxter Hire, MD      Brief Narrative:    Medical records reviewed and are as summarized below:  Christine [name] is a 76 y.o. female with medical history significant for anxiety, breast cancer, chronic systolic CHF, coronary artery disease s/p PCI and stent and AICD placement, GERD, hypertension, dyslipidemia,osteoarthritis, presented to the emergency room with acute onset of dizziness with leaning towards the left and increased forgetfulness.   She was admitted to the hospital for acute UTI.  She was treated with IV fluids and IV Rocephin.      Assessment/Plan:   Principal Problem:   UTI (urinary tract infection) Active Problems:   Dizziness   Essential hypertension   Coronary artery disease without angina pectoris   History of breast cancer   GERD without esophagitis   Body mass index is 14.37 kg/m.  (Underweight)  Acute UTI: Continue IV Rocephin.  Discontinue IV fluids.  Follow-up urine culture.  Dizziness: Check orthostatic vital signs.  PT and OT recommended home health therapy.  Hypertension: Continue antihypertensives  History of stroke, CAD: continue aspirin, Plavix and rosuvastatin  Cognitive impairment, suspect underlying dementia: Continue supportive care  History of left breast cancer: Continue tamoxifen  Diet Order             Diet Heart Room service appropriate? Yes; Fluid consistency: Thin  Diet effective now                          Consultants: None  Procedures: None         Medications:    aspirin EC  81 mg Oral Daily   carvedilol  25 mg Oral BID WC   clopidogrel  75 mg Oral Daily   enoxaparin (LOVENOX) injection  40 mg Subcutaneous Q24H   hydrOXYzine  25 mg Oral TID   irbesartan  300 mg Oral Daily   isosorbide mononitrate  30 mg Oral Daily   LORazepam  0.25 mg Oral BID   pantoprazole  40 mg Oral Daily   rosuvastatin  10  mg Oral Daily   traMADol  25 mg Oral BID   Continuous Infusions:  sodium chloride 100 mL/hr at 11/09/21 1210   cefTRIAXone (ROCEPHIN)  IV Stopped (11/09/21 0410)     Anti-infectives (From admission, onward)    Start     Dose/Rate Route Frequency Ordered Stop   11/09/21 0300  cefTRIAXone (ROCEPHIN) 1 g in sodium chloride 0.9 % 100 mL IVPB        1 g 200 mL/hr over 30 Minutes Intravenous Every 24 hours 11/09/21 0252 11/16/21 0259   11/09/21 0245  cefTRIAXone (ROCEPHIN) 1 g in sodium chloride 0.9 % 100 mL IVPB  Status:  Discontinued        1 g 200 mL/hr over 30 Minutes Intravenous  Once 11/09/21 0236 11/09/21 0306              Family Communication/Anticipated D/C date and plan/Code Status   DVT prophylaxis: enoxaparin (LOVENOX) injection 40 mg Start: 11/09/21 0800     Code Status: Full Code  Family Communication: None Disposition Plan: Plan to discharge home in 1 to 2 days   Status is: Inpatient Remains inpatient appropriate because: IV antibiotics       Subjective:   Interval events noted.  She does not report any complaints  Objective:  Vitals:   11/09/21 0130 11/09/21 0339 11/09/21 0407 11/09/21 0846  BP: (!) 175/88 (!) 159/93 (!) 177/84 (!) 161/81  Pulse: (!) 57 69 (!) 56 62  Resp: '18 16 16 18  '$ Temp:  97.8 F (36.6 C) 97.7 F (36.5 C) 98.1 F (36.7 C)  TempSrc:  Oral    SpO2: 99% 98% 99% 99%  Weight:   36.8 kg   Height:   '5\' 3"'$  (1.6 m)    No data found.   Intake/Output Summary (Last 24 hours) at 11/09/2021 1352 Last data filed at 11/09/2021 1208 Gross per 24 hour  Intake 200 ml  Output --  Net 200 ml   Filed Weights   11/08/21 2043 11/09/21 0407  Weight: 50.8 kg 36.8 kg    Exam:  GEN: NAD SKIN: Chronic wound over left breast and upper mid chest EYES: EOMI ENT: MMM CV: RRR PULM: CTA B ABD: soft, ND, NT, +BS CNS: AAO x 3, non focal EXT: No edema or tenderness          Data Reviewed:   I have personally reviewed  following labs and imaging studies:  Labs: Labs show the following:   Basic Metabolic Panel: Recent Labs  Lab 11/08/21 2051 11/09/21 0703  NA 143 139  K 3.7 3.3*  CL 107 107  CO2 27 25  GLUCOSE 93 86  BUN 14 12  CREATININE 0.87 0.67  CALCIUM 9.1 8.2*  MG 2.3  --   PHOS 3.6  --    GFR Estimated Creatinine Clearance: 35.8 mL/min (by C-G formula based on SCr of 0.67 mg/dL). Liver Function Tests: No results for input(s): AST, ALT, ALKPHOS, BILITOT, PROT, ALBUMIN in the last 168 hours. No results for input(s): LIPASE, AMYLASE in the last 168 hours. No results for input(s): AMMONIA in the last 168 hours. Coagulation profile No results for input(s): INR, PROTIME in the last 168 hours.  CBC: Recent Labs  Lab 11/08/21 2051 11/09/21 0703  WBC 5.6 4.7  HGB 11.9* 10.7*  HCT 37.4 32.7*  MCV 93.0 90.8  PLT 96* 80*   Cardiac Enzymes: No results for input(s): CKTOTAL, CKMB, CKMBINDEX, TROPONINI in the last 168 hours. BNP (last 3 results) No results for input(s): PROBNP in the last 8760 hours. CBG: Recent Labs  Lab 11/08/21 2059  GLUCAP 94   D-Dimer: No results for input(s): DDIMER in the last 72 hours. Hgb A1c: No results for input(s): HGBA1C in the last 72 hours. Lipid Profile: No results for input(s): CHOL, HDL, LDLCALC, TRIG, CHOLHDL, LDLDIRECT in the last 72 hours. Thyroid function studies: No results for input(s): TSH, T4TOTAL, T3FREE, THYROIDAB in the last 72 hours.  Invalid input(s): FREET3 Anemia work up: No results for input(s): VITAMINB12, FOLATE, FERRITIN, TIBC, IRON, RETICCTPCT in the last 72 hours. Sepsis Labs: Recent Labs  Lab 11/08/21 2051 11/09/21 0703  PROCALCITON <0.10  --   WBC 5.6 4.7    Microbiology No results found for this or any previous visit (from the past 240 hour(s)).  Procedures and diagnostic studies:  CT Head Wo Contrast  Result Date: 11/08/2021 CLINICAL DATA:  Delirium EXAM: CT HEAD WITHOUT CONTRAST TECHNIQUE: Contiguous  axial images were obtained from the base of the skull through the vertex without intravenous contrast. RADIATION DOSE REDUCTION: This exam was performed according to the departmental dose-optimization program which includes automated exposure control, adjustment of the mA and/or kV according to patient size and/or use of iterative reconstruction technique. COMPARISON:  09/28/2021 FINDINGS: Brain: There is atrophy  and chronic small vessel disease changes. No acute intracranial abnormality. Specifically, no hemorrhage, hydrocephalus, mass lesion, acute infarction, or significant intracranial injury. Vascular: No hyperdense vessel or unexpected calcification. Skull: No acute calvarial abnormality. Sinuses/Orbits: No acute findings Other: None IMPRESSION: Atrophy, chronic microvascular disease. No acute intracranial abnormality. Electronically Signed   By: Rolm Baptise M.D.   On: 11/08/2021 21:07               LOS: 0 days   Cambrey Lupi  [facility]   Pager on [email or web address]. If 7PM-7AM, please contact night-coverage at [email or web address]     11/09/2021, 1:52 PM

## 2021-11-09 NOTE — Assessment & Plan Note (Signed)
-   We will continue tamoxifen.

## 2021-11-10 DIAGNOSIS — N390 Urinary tract infection, site not specified: Secondary | ICD-10-CM | POA: Diagnosis present

## 2021-11-10 DIAGNOSIS — N3000 Acute cystitis without hematuria: Secondary | ICD-10-CM | POA: Diagnosis not present

## 2021-11-10 LAB — URINE CULTURE: Culture: <10000 — AB

## 2021-11-10 LAB — PROCALCITONIN: Procalcitonin: <0.1 ng/mL

## 2021-11-10 MED ORDER — CEPHALEXIN 500 MG PO CAPS: 500.0000 mg | ORAL_CAPSULE | Freq: Four times a day (QID) | ORAL | 0 refills | Status: AC

## 2021-11-10 MED ORDER — CEPHALEXIN 500 MG PO CAPS: 500.0000 mg | ORAL_CAPSULE | Freq: Four times a day (QID) | ORAL | Status: DC

## 2021-11-10 NOTE — TOC CM/SW Note (Signed)
CSW attempted calls to both patient's son [name] and niece Lynelle Smoke. No answer and unable to leave a VM on either number.  Oleh Genin, [place]

## 2021-11-10 NOTE — TOC Initial Note (Addendum)
Transition of Care [facility]) - Initial/Assessment Note    Patient Details  Name: Christine [name] MRN: [id number] Date of Birth: [birth year]  Transition of Care [facility]) CM/SW Contact:    Magnus Ivan, LCSW Phone Number: 11/10/2021, 3:39 PM  Clinical Narrative:               CSW spoke with patient's son [name].  Explained that the medical team feels patient needs 24 hour supervision. He verbalized understanding and stated he can stay with the patient. He stated they are considering ALF placement for patient and asked for a HHSW to assist with this.  Patient lives alone but [name] agreed to stay with her upon discharge. [name] stated he can pick patient up after 5pm today. RN notified.   [name] stated patient has no DME, HH, or SNF history. PCP is Dr. Edwina Barth. Pharmacy is [facility]. [name] provides transportation to appointments.  [name] is agreeable to [facility] and wants HHSW, no agency preference. Confirmed patient's home address. CSW is trying to find coverage.   3:56- [name] with [facility] accepted patient for PT, OT, SW. CSW asked MD to update orders to reflect this.   Expected Discharge Plan: [place] Barriers to Discharge: Barriers Resolved   Patient Goals and CMS Choice Patient states their goals for this hospitalization and ongoing recovery are:: home with home health CMS Medicare.gov Compare Post Acute Care list provided to:: Patient Represenative (must comment) Choice offered to / list presented to : Adult Children  Expected Discharge Plan and Services Expected Discharge Plan: [place]       Living arrangements for the past 2 months: Single Family Home Expected Discharge Date: 11/10/21                         HH Arranged: PT, OT   Date HH Agency Contacted: 11/10/21      Prior Living Arrangements/Services Living arrangements for the past 2 months: Single Family Home Lives with:: Self Patient language and need for interpreter reviewed::  Yes Do you feel safe going back to the place where you live?: Yes      Need for Family Participation in Patient Care: Yes (Comment) Care giver support system in place?: Yes (comment)   Criminal Activity/Legal Involvement Pertinent to Current Situation/Hospitalization: No - Comment as needed  Activities of Daily Living Home Assistive Devices/Equipment: None ADL Screening (condition at time of admission) Patient's cognitive ability adequate to safely complete daily activities?: No Is the patient deaf or have difficulty hearing?: No Does the patient have difficulty seeing, even when wearing glasses/contacts?: No Does the patient have difficulty concentrating, remembering, or making decisions?: Yes Patient able to express need for assistance with ADLs?: Yes Does the patient have difficulty dressing or bathing?: No Independently performs ADLs?: Yes (appropriate for developmental age) Does the patient have difficulty walking or climbing stairs?: No Weakness of Legs: None Weakness of Arms/Hands: None  Permission Sought/Granted Permission sought to share information with : Chartered certified accountant granted to share information with : Yes, Verbal Permission Granted (by son [name])              Emotional Assessment       Orientation: : Fluctuating Orientation (Suspected and/or reported Sundowners) Alcohol / Substance Use: Not Applicable Psych Involvement: No (comment)  Admission diagnosis:  Dizziness [R42] UTI (urinary tract infection) [N39.0] Acute cystitis with hematuria [N30.01] Urinary tract infection [N39.0] Patient Active Problem List  Diagnosis Date Noted   Urinary tract infection 11/10/2021   UTI (urinary tract infection) 11/09/2021   Dizziness 11/09/2021   Coronary artery disease without angina pectoris 11/09/2021   History of breast cancer 11/09/2021   GERD without esophagitis 11/09/2021   Cognitive decline 10/01/2021   Chest pain 09/28/2021    Thrombocytopenia ([place]) 09/28/2021   AAA (abdominal aortic aneurysm) without rupture ([place]) 03/13/2019   Breast cancer of upper-inner quadrant of right female breast ([place]) 07/01/2016   Chronic obstructive pulmonary disease ([place]) 04/05/2014   Breast cancer ([place]) 11/25/2013   Depression 11/25/2013   Implantable cardioverter-defibrillator (ICD) in situ 11/09/2008   ABNORMAL THYROID FUNCTION TESTS 07/30/2007   Essential hypertension 11/16/2006   FAILURE, SYSTOLIC HEART, CHRONIC 39/53/2023   PCP:  Baxter Hire, MD Pharmacy:   [place], [place] [address] Phone: [phone] Fax: [phone]     Social Determinants of Health (SDOH) Interventions    Readmission Risk Interventions     View : No data to display.

## 2021-11-10 NOTE — Care Management CC44 (Signed)
Condition Code 44 Documentation Completed  Patient Details  Name: Christine [name] MRN: [id number] Date of Birth: 03-Sep-1945   Condition Code 44 given:  Yes Patient signature on Condition Code 44 notice:  Yes Documentation of 2 MD's agreement:  Yes Code 44 added to claim:  Yes  Reviewed with son [name] via phone due to patient's confusion. Copy printed to be sent home with patient.   [place], LCSW 11/10/2021, 3:32 PM

## 2021-11-10 NOTE — Progress Notes (Signed)
Progress Note    Christine [name]  VZD:[id number] DOB: November 27, 1945  DOA: 11/09/2021 PCP: Baxter Hire, MD      Brief Narrative:    Medical records reviewed and are as summarized below:  Christine [name] is a 76 y.o. female with medical history significant for anxiety, breast cancer, chronic systolic CHF, coronary artery disease s/p PCI and stent and AICD placement, GERD, hypertension, dyslipidemia,osteoarthritis, presented to the emergency room with acute onset of dizziness with leaning towards the left and increased forgetfulness.   She was admitted to the hospital for acute UTI.  She was treated with IV fluids and IV Rocephin.      Assessment/Plan:   Principal Problem:   UTI (urinary tract infection) Active Problems:   Dizziness   Essential hypertension   Coronary artery disease without angina pectoris   History of breast cancer   GERD without esophagitis   Body mass index is 14.37 kg/m.  (Underweight)  Acute UTI: Urine culture did not show any significant growth.  Change IV Rocephin to Keflex.  Dizziness: Improved.  No follow-up PT recommended.  OT recommended home OT.  Hypertension: Continue antihypertensives  History of stroke, CAD: continue aspirin, Plavix and rosuvastatin  Cognitive impairment, suspect underlying dementia: Continue supportive care  History of left breast cancer: Continue tamoxifen  Diet Order             Diet Heart Room service appropriate? Yes; Fluid consistency: Thin  Diet effective now                          Consultants: None  Procedures: None         Medications:    aspirin EC  81 mg Oral Daily   carvedilol  25 mg Oral BID WC   clopidogrel  75 mg Oral Daily   enoxaparin (LOVENOX) injection  40 mg Subcutaneous Q24H   hydrOXYzine  25 mg Oral TID   irbesartan  300 mg Oral Daily   isosorbide mononitrate  30 mg Oral Daily   LORazepam  0.25 mg Oral BID   pantoprazole  40 mg Oral Daily   rosuvastatin  10  mg Oral Daily   traMADol  25 mg Oral BID   Continuous Infusions:  cefTRIAXone (ROCEPHIN)  IV 1 g (11/10/21 0329)     Anti-infectives (From admission, onward)    Start     Dose/Rate Route Frequency Ordered Stop   11/09/21 0300  cefTRIAXone (ROCEPHIN) 1 g in sodium chloride 0.9 % 100 mL IVPB        1 g 200 mL/hr over 30 Minutes Intravenous Every 24 hours 11/09/21 0252 11/16/21 0259   11/09/21 0245  cefTRIAXone (ROCEPHIN) 1 g in sodium chloride 0.9 % 100 mL IVPB  Status:  Discontinued        1 g 200 mL/hr over 30 Minutes Intravenous  Once 11/09/21 0236 11/09/21 0306              Family Communication/Anticipated D/C date and plan/Code Status   DVT prophylaxis: enoxaparin (LOVENOX) injection 40 mg Start: 11/09/21 0800     Code Status: Full Code  Family Communication: None.  Unable to reach Legrand Como (son) and Lynelle Smoke (niece) by phone Disposition Plan: Plan to discharge home today.   Status is: Inpatient Remains inpatient appropriate because: Plan to discharge home today       Subjective:   Interval events. She has no complaints.  Objective:  Vitals:   11/09/21 2047 11/10/21 0325 11/10/21 0832 11/10/21 0916  BP: (!) 144/72 139/69 (!) 173/104 (!) 165/101  Pulse: 63 (!) 56 62 69  Resp: '18 16 18 18  '$ Temp: 98.7 F (37.1 C) 98 F (36.7 C) 98.2 F (36.8 C) 98.8 F (37.1 C)  TempSrc: Oral     SpO2: 100% 98% 100% 100%  Weight:      Height:       No data found.   Intake/Output Summary (Last 24 hours) at 11/10/2021 1446 Last data filed at 11/10/2021 0600 Gross per 24 hour  Intake 386.87 ml  Output 2 ml  Net 384.87 ml   Filed Weights   11/08/21 2043 11/09/21 0407  Weight: 50.8 kg 36.8 kg    Exam:  GEN: NAD SKIN: Chronic wound over the left breast upper mid chest EYES: EOMI ENT: MMM CV: RRR PULM: CTA B ABD: soft, ND, NT, +BS CNS: AAO x 3, non focal EXT: No edema or tenderness         Data Reviewed:   I have personally reviewed  following labs and imaging studies:  Labs: Labs show the following:   Basic Metabolic Panel: Recent Labs  Lab 11/08/21 2051 11/09/21 0703  NA 143 139  K 3.7 3.3*  CL 107 107  CO2 27 25  GLUCOSE 93 86  BUN 14 12  CREATININE 0.87 0.67  CALCIUM 9.1 8.2*  MG 2.3  --   PHOS 3.6  --    GFR Estimated Creatinine Clearance: 35.8 mL/min (by C-G formula based on SCr of 0.67 mg/dL). Liver Function Tests: No results for input(s): AST, ALT, ALKPHOS, BILITOT, PROT, ALBUMIN in the last 168 hours. No results for input(s): LIPASE, AMYLASE in the last 168 hours. No results for input(s): AMMONIA in the last 168 hours. Coagulation profile No results for input(s): INR, PROTIME in the last 168 hours.  CBC: Recent Labs  Lab 11/08/21 2051 11/09/21 0703  WBC 5.6 4.7  HGB 11.9* 10.7*  HCT 37.4 32.7*  MCV 93.0 90.8  PLT 96* 80*   Cardiac Enzymes: No results for input(s): CKTOTAL, CKMB, CKMBINDEX, TROPONINI in the last 168 hours. BNP (last 3 results) No results for input(s): PROBNP in the last 8760 hours. CBG: Recent Labs  Lab 11/08/21 2059  GLUCAP 94   D-Dimer: No results for input(s): DDIMER in the last 72 hours. Hgb A1c: No results for input(s): HGBA1C in the last 72 hours. Lipid Profile: No results for input(s): CHOL, HDL, LDLCALC, TRIG, CHOLHDL, LDLDIRECT in the last 72 hours. Thyroid function studies: No results for input(s): TSH, T4TOTAL, T3FREE, THYROIDAB in the last 72 hours.  Invalid input(s): FREET3 Anemia work up: No results for input(s): VITAMINB12, FOLATE, FERRITIN, TIBC, IRON, RETICCTPCT in the last 72 hours. Sepsis Labs: Recent Labs  Lab 11/08/21 2051 11/09/21 0703 11/10/21 0444  PROCALCITON <0.10  --  <0.10  WBC 5.6 4.7  --     Microbiology Recent Results (from the past 240 hour(s))  Urine Culture     Status: Abnormal   Collection Time: 11/09/21  2:07 AM   Specimen: Urine, Clean Catch  Result Value Ref Range Status   Specimen Description   Final     URINE, CLEAN CATCH Performed at [facility], [address] [place], [place] [address]    Special Requests   Final    NONE Performed at [facility], [address] [place], [place] [address]    Culture (A)  Final    <  10,000 COLONIES/mL INSIGNIFICANT GROWTH Performed at [facility], [place] [address], [place], [place] [address]    Report Status 11/10/2021 FINAL  Final    Procedures and diagnostic studies:  CT Head Wo Contrast  Result Date: 11/08/2021 CLINICAL DATA:  Delirium EXAM: CT HEAD WITHOUT CONTRAST TECHNIQUE: Contiguous axial images were obtained from the base of the skull through the vertex without intravenous contrast. RADIATION DOSE REDUCTION: This exam was performed according to the departmental dose-optimization program which includes automated exposure control, adjustment of the mA and/or kV according to patient size and/or use of iterative reconstruction technique. COMPARISON:  09/28/2021 FINDINGS: Brain: There is atrophy and chronic small vessel disease changes. No acute intracranial abnormality. Specifically, no hemorrhage, hydrocephalus, mass lesion, acute infarction, or significant intracranial injury. Vascular: No hyperdense vessel or unexpected calcification. Skull: No acute calvarial abnormality. Sinuses/Orbits: No acute findings Other: None IMPRESSION: Atrophy, chronic microvascular disease. No acute intracranial abnormality. Electronically Signed   By: Rolm Baptise M.D.   On: 11/08/2021 21:07               LOS: 1 day   Azhar Yogi  [facility]   Pager on [email or web address]. If 7PM-7AM, please contact night-coverage at [email or web address]     11/10/2021, 2:46 PM

## 2021-11-10 NOTE — Discharge Summary (Signed)
Physician Discharge Summary   Patient: Christine [name] MRN: [id number] DOB: Feb 28, 1946  Admit date:     11/09/2021  Discharge date: 11/10/2021  Discharge Physician: Jennye Boroughs   PCP: Baxter Hire, MD   Recommendations at discharge:   Follow-up with PCP in 1 week  Discharge Diagnoses: Principal Problem:   UTI (urinary tract infection) Active Problems:   Dizziness   Essential hypertension   Coronary artery disease without angina pectoris   History of breast cancer   GERD without esophagitis  Resolved Problems:   * No resolved hospital problems. *  Hospital Course:  Christine [name] is a 76 y.o. female with medical history significant for anxiety, breast cancer, chronic systolic CHF, coronary artery disease s/p PCI and stent and AICD placement, GERD, hypertension, dyslipidemia,osteoarthritis, presented to the emergency room with acute onset of dizziness with leaning towards the left and increased forgetfulness.    She was admitted to the hospital for acute UTI.  She was treated with IV fluids and IV Rocephin.           Consultants: None Procedures performed: None  Disposition: Home health Diet recommendation:  Discharge Diet Orders (From admission, onward)     Start     Ordered   11/10/21 0000  Diet - low sodium heart healthy        11/10/21 1456           Cardiac diet DISCHARGE MEDICATION: Allergies as of 11/10/2021       Reactions   Atorvastatin Other (See Comments)   increased LFT's   Lisinopril Cough   Oxycodone Hcl Itching, Other (See Comments)   Restless & jittery   Simvastatin Other (See Comments)   myalgias        Medication List     STOP taking these medications    albuterol 108 (90 Base) MCG/ACT inhaler Commonly known as: VENTOLIN HFA   furosemide 40 MG tablet Commonly known as: LASIX   tetrahydrozoline 0.05 % ophthalmic solution   venlafaxine XR 37.5 MG 24 hr capsule Commonly known as: EFFEXOR-XR       TAKE these  medications    aspirin EC 81 MG tablet Take 81 mg by mouth daily.   bisacodyl 5 MG EC tablet Commonly known as: DULCOLAX Take 5 mg by mouth daily as needed for mild constipation or moderate constipation.   carvedilol 25 MG tablet Commonly known as: COREG TAKE 1 TABLET BY MOUTH TWICE A DAY What changed: when to take this   cephALEXin 500 MG capsule Commonly known as: KEFLEX Take 1 capsule (500 mg total) by mouth 4 (four) times daily for 3 days.   clopidogrel 75 MG tablet Commonly known as: PLAVIX TAKE 1 TABLET BY MOUTH ONCE DAILY   hydrOXYzine 25 MG tablet Commonly known as: ATARAX Take 25 mg by mouth 3 (three) times daily.   isosorbide mononitrate 30 MG 24 hr tablet Commonly known as: IMDUR Take 1 tablet (30 mg total) by mouth daily.   LORazepam 0.5 MG tablet Commonly known as: ATIVAN Take 0.25 mg by mouth 2 (two) times daily.   pantoprazole 40 MG tablet Commonly known as: PROTONIX Take 40 mg by mouth daily.   PEG 3350 17 GM/SCOOP Powd Take 17 g by mouth daily as needed (constipation.).   rosuvastatin 10 MG tablet Commonly known as: CRESTOR Take 10 mg by mouth daily.   tamoxifen 20 MG tablet Commonly known as: NOLVADEX Take 1 tablet (20 mg total) by mouth daily.  traMADol 50 MG tablet Commonly known as: ULTRAM Take 25 mg by mouth 2 (two) times daily.   valsartan 320 MG tablet Commonly known as: DIOVAN Take 320 mg by mouth daily at 12 noon.               Discharge Care Instructions  (From admission, onward)           Start     Ordered   11/10/21 0000  Discharge wound care:       Comments: Cover superficial wounds on the chest with gauze and keep them clean and dry   11/10/21 1456            Discharge Exam: Filed Weights   11/08/21 2043 11/09/21 0407  Weight: 50.8 kg 36.8 kg   GEN: NAD SKIN: Superficial wound on the upper mid chest and over the left breast EYES: EOMI ENT: MMM CV: RRR PULM: CTA B ABD: soft, ND, NT, +BS CNS:  AAO x 3, non focal EXT: No edema or tenderness   Condition at discharge: good  The results of significant diagnostics from this hospitalization (including imaging, microbiology, ancillary and laboratory) are listed below for reference.   Imaging Studies: CT Head Wo Contrast  Result Date: 11/08/2021 CLINICAL DATA:  Delirium EXAM: CT HEAD WITHOUT CONTRAST TECHNIQUE: Contiguous axial images were obtained from the base of the skull through the vertex without intravenous contrast. RADIATION DOSE REDUCTION: This exam was performed according to the departmental dose-optimization program which includes automated exposure control, adjustment of the mA and/or kV according to patient size and/or use of iterative reconstruction technique. COMPARISON:  09/28/2021 FINDINGS: Brain: There is atrophy and chronic small vessel disease changes. No acute intracranial abnormality. Specifically, no hemorrhage, hydrocephalus, mass lesion, acute infarction, or significant intracranial injury. Vascular: No hyperdense vessel or unexpected calcification. Skull: No acute calvarial abnormality. Sinuses/Orbits: No acute findings Other: None IMPRESSION: Atrophy, chronic microvascular disease. No acute intracranial abnormality. Electronically Signed   By: Rolm Baptise M.D.   On: 11/08/2021 21:07    Microbiology: Results for orders placed or performed during the hospital encounter of 11/09/21  Urine Culture     Status: Abnormal   Collection Time: 11/09/21  2:07 AM   Specimen: Urine, Clean Catch  Result Value Ref Range Status   Specimen Description   Final    URINE, CLEAN CATCH Performed at [facility], [address] [place], [place] [address]    Special Requests   Final    NONE Performed at [facility], [address], [place], [place] [address]    Culture (A)  Final    <10,000 COLONIES/mL INSIGNIFICANT GROWTH Performed at [facility], [place] [address], [place], [place] [address]    Report  Status 11/10/2021 FINAL  Final    Labs: CBC: Recent Labs  Lab 11/08/21 2051 11/09/21 0703  WBC 5.6 4.7  HGB 11.9* 10.7*  HCT 37.4 32.7*  MCV 93.0 90.8  PLT 96* 80*   Basic Metabolic Panel: Recent Labs  Lab 11/08/21 2051 11/09/21 0703  NA 143 139  K 3.7 3.3*  CL 107 107  CO2 27 25  GLUCOSE 93 86  BUN 14 12  CREATININE 0.87 0.67  CALCIUM 9.1 8.2*  MG 2.3  --   PHOS 3.6  --    Liver Function Tests: No results for input(s): AST, ALT, ALKPHOS, BILITOT, PROT, ALBUMIN in the last 168 hours. CBG: Recent Labs  Lab 11/08/21 2059  GLUCAP 94  Discharge time spent: greater than 30 minutes.  Signed: Jennye Boroughs, MD [facility] 11/10/2021

## 2021-12-03 ENCOUNTER — Emergency Department: Payer: Medicare Other

## 2021-12-03 ENCOUNTER — Encounter: Payer: Self-pay | Admitting: Emergency Medicine

## 2021-12-03 ENCOUNTER — Inpatient Hospital Stay
Admission: EM | Admit: 2021-12-03 | Discharge: 2021-12-05 | DRG: 252 | Disposition: A | Discharge status: Home Health | Payer: Medicare Other | Attending: Osteopathic Medicine | Admitting: Osteopathic Medicine

## 2021-12-03 ENCOUNTER — Inpatient Hospital Stay: Payer: Medicare Other

## 2021-12-03 DIAGNOSIS — Z955 Presence of coronary angioplasty implant and graft: Secondary | ICD-10-CM

## 2021-12-03 DIAGNOSIS — N611 Abscess of the breast and nipple: Secondary | ICD-10-CM | POA: Diagnosis present

## 2021-12-03 DIAGNOSIS — I70223 Atherosclerosis of native arteries of extremities with rest pain, bilateral legs: Principal | ICD-10-CM | POA: Diagnosis present

## 2021-12-03 DIAGNOSIS — I998 Other disorder of circulatory system: Secondary | ICD-10-CM

## 2021-12-03 DIAGNOSIS — Z7982 Long term (current) use of aspirin: Secondary | ICD-10-CM

## 2021-12-03 DIAGNOSIS — F17210 Nicotine dependence, cigarettes, uncomplicated: Secondary | ICD-10-CM | POA: Diagnosis present

## 2021-12-03 DIAGNOSIS — I714 Abdominal aortic aneurysm, without rupture, unspecified: Secondary | ICD-10-CM | POA: Diagnosis present

## 2021-12-03 DIAGNOSIS — L97509 Non-pressure chronic ulcer of other part of unspecified foot with unspecified severity: Secondary | ICD-10-CM | POA: Diagnosis present

## 2021-12-03 DIAGNOSIS — I5020 Unspecified systolic (congestive) heart failure: Secondary | ICD-10-CM

## 2021-12-03 DIAGNOSIS — I70222 Atherosclerosis of native arteries of extremities with rest pain, left leg: Secondary | ICD-10-CM

## 2021-12-03 DIAGNOSIS — I700 Atherosclerosis of aorta: Secondary | ICD-10-CM | POA: Diagnosis not present

## 2021-12-03 DIAGNOSIS — Z7902 Long term (current) use of antithrombotics/antiplatelets: Secondary | ICD-10-CM

## 2021-12-03 DIAGNOSIS — Z79899 Other long term (current) drug therapy: Secondary | ICD-10-CM | POA: Diagnosis not present

## 2021-12-03 DIAGNOSIS — I2510 Atherosclerotic heart disease of native coronary artery without angina pectoris: Secondary | ICD-10-CM | POA: Diagnosis present

## 2021-12-03 DIAGNOSIS — I739 Peripheral vascular disease, unspecified: Secondary | ICD-10-CM

## 2021-12-03 DIAGNOSIS — R0902 Hypoxemia: Secondary | ICD-10-CM | POA: Diagnosis present

## 2021-12-03 DIAGNOSIS — M1990 Unspecified osteoarthritis, unspecified site: Secondary | ICD-10-CM | POA: Diagnosis present

## 2021-12-03 DIAGNOSIS — Z888 Allergy status to other drugs, medicaments and biological substances status: Secondary | ICD-10-CM

## 2021-12-03 DIAGNOSIS — I5023 Acute on chronic systolic (congestive) heart failure: Secondary | ICD-10-CM | POA: Diagnosis present

## 2021-12-03 DIAGNOSIS — J449 Chronic obstructive pulmonary disease, unspecified: Secondary | ICD-10-CM | POA: Diagnosis present

## 2021-12-03 DIAGNOSIS — Z853 Personal history of malignant neoplasm of breast: Secondary | ICD-10-CM

## 2021-12-03 DIAGNOSIS — R079 Chest pain, unspecified: Secondary | ICD-10-CM | POA: Diagnosis present

## 2021-12-03 DIAGNOSIS — R0609 Other forms of dyspnea: Secondary | ICD-10-CM | POA: Diagnosis not present

## 2021-12-03 DIAGNOSIS — I110 Hypertensive heart disease with heart failure: Secondary | ICD-10-CM | POA: Diagnosis present

## 2021-12-03 DIAGNOSIS — I252 Old myocardial infarction: Secondary | ICD-10-CM | POA: Diagnosis not present

## 2021-12-03 DIAGNOSIS — Z8249 Family history of ischemic heart disease and other diseases of the circulatory system: Secondary | ICD-10-CM | POA: Diagnosis not present

## 2021-12-03 DIAGNOSIS — F419 Anxiety disorder, unspecified: Secondary | ICD-10-CM | POA: Diagnosis present

## 2021-12-03 DIAGNOSIS — Z885 Allergy status to narcotic agent status: Secondary | ICD-10-CM

## 2021-12-03 DIAGNOSIS — R54 Age-related physical debility: Secondary | ICD-10-CM | POA: Diagnosis present

## 2021-12-03 DIAGNOSIS — R946 Abnormal results of thyroid function studies: Secondary | ICD-10-CM | POA: Diagnosis present

## 2021-12-03 DIAGNOSIS — Z95810 Presence of automatic (implantable) cardiac defibrillator: Secondary | ICD-10-CM

## 2021-12-03 DIAGNOSIS — K219 Gastro-esophageal reflux disease without esophagitis: Secondary | ICD-10-CM | POA: Diagnosis present

## 2021-12-03 DIAGNOSIS — E785 Hyperlipidemia, unspecified: Secondary | ICD-10-CM | POA: Diagnosis present

## 2021-12-03 DIAGNOSIS — I701 Atherosclerosis of renal artery: Secondary | ICD-10-CM | POA: Diagnosis present

## 2021-12-03 DIAGNOSIS — Z823 Family history of stroke: Secondary | ICD-10-CM

## 2021-12-03 DIAGNOSIS — R600 Localized edema: Secondary | ICD-10-CM | POA: Diagnosis present

## 2021-12-03 LAB — CBC WITH DIFFERENTIAL/PLATELET
Abs Immature Granulocytes: 0.03 10*3/uL (ref 0.00–0.07)
Basophils Absolute: 0 10*3/uL (ref 0.0–0.1)
Basophils Relative: 1 %
Eosinophils Absolute: 0.2 10*3/uL (ref 0.0–0.5)
Eosinophils Relative: 3 %
HCT: 37.9 % (ref 36.0–46.0)
Hemoglobin: 12.1 g/dL (ref 12.0–15.0)
Immature Granulocytes: 0 %
Lymphocytes Relative: 18 %
Lymphs Abs: 1.3 10*3/uL (ref 0.7–4.0)
MCH: 30 pg (ref 26.0–34.0)
MCHC: 31.9 g/dL (ref 30.0–36.0)
MCV: 93.8 fL (ref 80.0–100.0)
Monocytes Absolute: 0.6 10*3/uL (ref 0.1–1.0)
Monocytes Relative: 8 %
Neutro Abs: 5.1 10*3/uL (ref 1.7–7.7)
Neutrophils Relative %: 70 %
Platelets: 118 10*3/uL — ABNORMAL LOW (ref 150–400)
RBC: 4.04 MIL/uL (ref 3.87–5.11)
RDW: 14.3 % (ref 11.5–15.5)
WBC: 7.2 10*3/uL (ref 4.0–10.5)
nRBC: 0 % (ref 0.0–0.2)

## 2021-12-03 LAB — BASIC METABOLIC PANEL
Anion gap: 9 (ref 5–15)
BUN: 14 mg/dL (ref 8–23)
CO2: 26 mmol/L (ref 22–32)
Calcium: 8.9 mg/dL (ref 8.9–10.3)
Chloride: 107 mmol/L (ref 98–111)
Creatinine, Ser: 1.01 mg/dL — ABNORMAL HIGH (ref 0.44–1.00)
GFR, Estimated: 58 mL/min — ABNORMAL LOW (ref >60)
Glucose, Bld: 101 mg/dL — ABNORMAL HIGH (ref 70–99)
Potassium: 3.7 mmol/L (ref 3.5–5.1)
Sodium: 142 mmol/L (ref 135–145)

## 2021-12-03 LAB — APTT: aPTT: 31 seconds (ref 24–36)

## 2021-12-03 LAB — BLOOD GAS, VENOUS
Acid-base deficit: 4.1 mmol/L — ABNORMAL HIGH (ref 0.0–2.0)
Bicarbonate: 24.6 mmol/L (ref 20.0–28.0)
O2 Saturation: 33.6 %
Patient temperature: 37
pCO2, Ven: 60 mmHg (ref 44–60)
pH, Ven: 7.22 — ABNORMAL LOW (ref 7.25–7.43)
pO2, Ven: <31 mmHg — CL (ref 32–45)

## 2021-12-03 LAB — URINALYSIS, COMPLETE (UACMP) WITH MICROSCOPIC
Bacteria, UA: NONE SEEN
Bilirubin Urine: NEGATIVE
Glucose, UA: NEGATIVE mg/dL
Ketones, ur: NEGATIVE mg/dL
Leukocytes,Ua: NEGATIVE
Nitrite: NEGATIVE
Protein, ur: NEGATIVE mg/dL
Specific Gravity, Urine: 1.012 (ref 1.005–1.030)
pH: 7 (ref 5.0–8.0)

## 2021-12-03 LAB — D-DIMER, QUANTITATIVE: D-Dimer, Quant: 9.5 ug/mL-FEU — ABNORMAL HIGH (ref 0.00–0.50)

## 2021-12-03 LAB — TSH: TSH: 10.272 u[IU]/mL — ABNORMAL HIGH (ref 0.350–4.500)

## 2021-12-03 LAB — TROPONIN I (HIGH SENSITIVITY)
Troponin I (High Sensitivity): 24 ng/L — ABNORMAL HIGH (ref <18)
Troponin I (High Sensitivity): 28 ng/L — ABNORMAL HIGH (ref <18)
Troponin I (High Sensitivity): 52 ng/L — ABNORMAL HIGH (ref <18)

## 2021-12-03 LAB — BRAIN NATRIURETIC PEPTIDE: B Natriuretic Peptide: 3227.9 pg/mL — ABNORMAL HIGH (ref 0.0–100.0)

## 2021-12-03 LAB — PROTIME-INR
INR: 1.2 (ref 0.8–1.2)
Prothrombin Time: 14.8 seconds (ref 11.4–15.2)

## 2021-12-03 MED ORDER — NITROGLYCERIN 2 % TD OINT
1.0000 [in_us] | TOPICAL_OINTMENT | Freq: Four times a day (QID) | TRANSDERMAL | Status: DC | PRN
Start: 2021-12-03 — End: 2021-12-06

## 2021-12-03 MED ORDER — ONDANSETRON HCL 4 MG PO TABS: 4.0000 mg | ORAL_TABLET | Freq: Four times a day (QID) | ORAL | Status: DC | PRN

## 2021-12-03 MED ORDER — HEPARIN SODIUM (PORCINE) 5000 UNIT/ML IJ SOLN
4000.0000 [IU] | Freq: Once | INTRAMUSCULAR | Status: DC
  Administered 2021-12-03: 4000 [IU] via INTRAVENOUS
  Filled 2021-12-03: qty 1

## 2021-12-03 MED ORDER — IOHEXOL 350 MG/ML SOLN
75.0000 mL | Freq: Once | INTRAVENOUS | Status: AC | PRN
Start: 2021-12-03 — End: 2021-12-03
  Administered 2021-12-03: 75 mL via INTRAVENOUS

## 2021-12-03 MED ORDER — HEPARIN (PORCINE) 25000 UT/250ML-% IV SOLN
650.0000 [IU]/h | INTRAVENOUS | Status: DC
  Administered 2021-12-03: 500 [IU]/h via INTRAVENOUS
  Filled 2021-12-03: qty 250

## 2021-12-03 MED ORDER — FUROSEMIDE 10 MG/ML IJ SOLN
20.0000 mg | Freq: Once | INTRAMUSCULAR | Status: AC
  Administered 2021-12-03: 20 mg via INTRAVENOUS
  Filled 2021-12-03: qty 4

## 2021-12-03 MED ORDER — HEPARIN BOLUS VIA INFUSION
2000.0000 [IU] | Freq: Once | INTRAVENOUS | Status: DC
  Filled 2021-12-03: qty 2000

## 2021-12-03 MED ORDER — ONDANSETRON HCL 4 MG/2ML IJ SOLN: 4.0000 mg | Freq: Four times a day (QID) | INTRAMUSCULAR | Status: DC | PRN

## 2021-12-03 MED ORDER — LABETALOL HCL 5 MG/ML IV SOLN
10.0000 mg | INTRAVENOUS | Status: DC | PRN
Start: 2021-12-03 — End: 2021-12-06

## 2021-12-03 MED ORDER — LORAZEPAM 1 MG PO TABS
1.0000 mg | ORAL_TABLET | Freq: Once | ORAL | Status: AC
  Administered 2021-12-03: 1 mg via ORAL
  Filled 2021-12-03: qty 1

## 2021-12-03 MED ORDER — CEFAZOLIN SODIUM-DEXTROSE 2-4 GM/100ML-% IV SOLN
2.0000 g | INTRAVENOUS | Status: AC
  Administered 2021-12-04: 2 g via INTRAVENOUS
  Filled 2021-12-03: qty 100

## 2021-12-03 MED ORDER — ALBUTEROL SULFATE (2.5 MG/3ML) 0.083% IN NEBU: 2.5000 mg | INHALATION_SOLUTION | RESPIRATORY_TRACT | Status: DC | PRN

## 2021-12-03 MED ORDER — MORPHINE SULFATE (PF) 2 MG/ML IV SOLN: 2.0000 mg | INTRAVENOUS | Status: DC | PRN

## 2021-12-03 NOTE — ED Notes (Signed)
ED Provider at bedside. 

## 2021-12-03 NOTE — ED Notes (Addendum)
Blood pressure in various extremities   Right arm: 175/100 map(123) Left arm: 153/91 map(110) Right leg: 136/118 map(125) Left leg: 39/25 map(32)

## 2021-12-03 NOTE — ED Notes (Signed)
Sarah RN aware of assigned bed

## 2021-12-03 NOTE — ED Provider Triage Note (Signed)
Emergency Medicine Provider Triage Evaluation Note  CHERAL CAPPUCCI , a 76 y.o. female  was evaluated in triage.  Pt complains of swelling in the feet bilaterally worse on the left.  Noticed this today.  Still has swelling at the ankles only..  Review of Systems  Positive: Swelling of the feet Negative: Fever chills  Physical Exam  BP (!) 168/86 (BP Location: Left Arm)   Pulse 73   Temp 98 F (36.7 C) (Oral)   Resp 20   SpO2 96%  Gen:   Awake, no distress   Resp:  Normal effort  MSK:   Moves extremities without difficulty, pulse intact on the left foot, area marked Other:    Medical Decision Making  Medically screening exam initiated at 5:20 PM.  Appropriate orders placed.  MYRTA MERCER was informed that the remainder of the evaluation will be completed by another provider, this initial triage assessment does not replace that evaluation, and the importance of remaining in the ED until their evaluation is complete.  Ultrasound left lower extremity for DVT, labs   Versie Starks, PA-C 12/03/21 1721

## 2021-12-03 NOTE — ED Notes (Signed)
Pt states she cant breathe. Oxygen reading 99% at this time. Pt states, "its a panic attack, Im having a panic attack". Instructed pt to take deep breathes and focus on her breathing. Pt calmed down.

## 2021-12-03 NOTE — H&P (Signed)
History and Physical    Christine [name]:[id number] DOB: 1946-01-12 DOA: 12/03/2021  PCP: Baxter Hire, MD  Patient coming from: home  I have personally briefly reviewed patient's old medical records in [place]  Chief Complaint: left leg pain  HPI: Christine [name] is a 76 y.o. female with medical history significant of   anxiety, breast cancer, chronic systolic CHF ef 81%, coronary artery disease s/p PCI and stent and AICD placement, GERD, hypertension, dyslipidemia,osteoarthritis,  PVD, who presents to the emergency room brought in by son with complaint of progressive left lower extremity rest pain. Per son patient has history of claudication but of late has had increasing complaint of left lower extremity pain. He notes that pain was acutely worse this am. He also notes swelling and skin changes along with new onset of acute pain. Patient notes she also has had increase sob  and episode of chest pain.  Son and patient denies any current home O2 use but patient noted she has had home O2 in the past.  She notes intermittent chills but no fever/ n/v/dysuria/diarrhea or abdominal pain. She does endorse continued tobacco abuse but notes she is trying to cut down.   ED Course:  Afeb, bp 168/86, hr 73, rr 20 sat 95% on 4L  On evaluation in ED patient noted to have ischemic limb with no palpable pulse on left lower extremity. Vascular Dr Delana Meyer was consulted who recommended heparin with plans for possible intervention in am.  ED course complicated by sob and what patient describes as an anxiety attach.  Patient it appears had lower O2  sat and was placed on 4L with sat of 95% . Of note the lower sat not available on chart. Patient was treated with ativan with some improvement in symptoms, but still with complaint of sob.  Of note cxr showed ? Mild congestion. D-dimer + and CTPA  currenlty pending. Of note patient was already ordered heparin for ischemic limb.  Labs:  Wbc 7.2, hgb 12.1 ,  plt118 Na142, K3.7, glu 101 CE24, 28 BnP 3227 D-dimer:9.5 U/s left leg neg CxrChanges of mild CHF.  Review of Systems: As per HPI otherwise 10 point review of systems negative.   Past Medical History:  Diagnosis Date   Allergic rhinitis    Anxiety    Breast cancer ([place])    11/26/20 Pt reports wound/lesion on left breast   Chronic systolic heart failure (HCC)    Coronary artery disease    GERD (gastroesophageal reflux disease)    Heart murmur    Hyperlipidemia    Hypertension    Implantable defibrillator St Jude    DOI 2006   MI (myocardial infarction) [facility])    Osteoarthritis    Wears dentures    full upper and lower    Past Surgical History:  Procedure Laterality Date   CHF exacerbation  10/07   CORONARY STENT PLACEMENT  8/06   3 stents; myocardial infarction   LOWER EXTREMITY ANGIOGRAPHY Left 02/15/2019   Procedure: LOWER EXTREMITY ANGIOGRAPHY;  Surgeon: Katha Cabal, MD;  Location: [facility];  Service: Cardiovascular;  Laterality: Left;   NM MYOVIEW LTD  1/08   EF 39%, no sx ischemia   PACEMAKER PLACEMENT  2/07   Defibrillator   RENAL ARTERY STENT Left      reports that she has been smoking cigarettes. She has a 6.00 pack-year smoking history. She has never used smokeless tobacco. She reports that she does not  drink alcohol and does not use drugs.  Allergies  Allergen Reactions   Atorvastatin Other (See Comments)    increased LFT's   Lisinopril Cough   Oxycodone Hcl Itching and Other (See Comments)    Restless & jittery   Simvastatin Other (See Comments)    myalgias    Family History  Problem Relation Age of Onset   Stroke Mother 66   Heart attack Father    Coronary artery disease Father    Heart attack Brother    Coronary artery disease Other    Hypertension Other    Cancer Neg Hx        No breast or colon cancer    Prior to Admission medications   Medication Sig Start Date End Date Taking? Authorizing Provider  aspirin EC 81 MG  tablet Take 81 mg by mouth daily.    [provider]  bisacodyl (DULCOLAX) 5 MG EC tablet Take 5 mg by mouth daily as needed for mild constipation or moderate constipation.    [provider]  carvedilol (COREG) 25 MG tablet TAKE 1 TABLET BY MOUTH TWICE A DAY Patient taking differently: Take 25 mg by mouth 2 (two) times daily with a meal. 09/09/10   Venia Carbon, MD  clopidogrel (PLAVIX) 75 MG tablet TAKE 1 TABLET BY MOUTH ONCE DAILY Patient taking differently: Take 75 mg by mouth daily. 12/05/19   Schnier, Dolores Lory, MD  hydrOXYzine (ATARAX) 25 MG tablet Take 25 mg by mouth 3 (three) times daily. 09/04/21   [provider]  isosorbide mononitrate (IMDUR) 30 MG 24 hr tablet Take 1 tablet (30 mg total) by mouth daily. 10/04/21 11/09/21  Wyvonnia Dusky, MD  LORazepam (ATIVAN) 0.5 MG tablet Take 0.25 mg by mouth 2 (two) times daily.  03/13/17   [provider]  pantoprazole (PROTONIX) 40 MG tablet Take 40 mg by mouth daily.  02/25/17   [provider]  Polyethylene Glycol 3350 (PEG 3350) POWD Take 17 g by mouth daily as needed (constipation.).     [provider]  rosuvastatin (CRESTOR) 10 MG tablet Take 10 mg by mouth daily.     [provider]  tamoxifen (NOLVADEX) 20 MG tablet Take 1 tablet (20 mg total) by mouth daily. 12/29/18   Lloyd Huger, MD  traMADol (ULTRAM) 50 MG tablet Take 25 mg by mouth 2 (two) times daily.    [provider]  valsartan (DIOVAN) 320 MG tablet Take 320 mg by mouth daily at 12 noon.     [provider]    Physical Exam: Vitals:   12/03/21 1712 12/03/21 1715 12/03/21 1930 12/03/21 2008  BP: (!) 168/86  (!) 187/98   Pulse: 73  81   Resp: 20  (!) 21   Temp:  98 F (36.7 C)    TempSrc:  Oral    SpO2: 96%  97%   Weight:    40.2 kg     Vitals:   12/03/21 1712 12/03/21 1715 12/03/21 1930 12/03/21 2008  BP: (!) 168/86  (!) 187/98   Pulse: 73  81   Resp: 20  (!) 21   Temp:   98 F (36.7 C)    TempSrc:  Oral    SpO2: 96%  97%   Weight:    40.2 kg  Constitutional: NAD, calm, comfortable Eyes: PERRL, lids and conjunctivae normal ENMT: Mucous membranes are dry. Posterior pharynx clear of any exudate or lesions.Normal dentition.  Neck: normal, supple, no masses,  no thyromegaly Respiratory: clear to auscultation bilaterally, no wheezing, no crackles. Normal respiratory effort. No accessory muscle use.  Cardiovascular: Regular rate and rhythm, no murmurs / rubs / gallops. + edema. no pedal pulses Abdomen: no tenderness, no masses palpated. No hepatosplenomegaly. Bowel sounds positive.  Musculoskeletal: no clubbing / cyanosis. No joint deformity upper and lower extremities. Good ROM, no contractures. Normal muscle tone.  Lower left extremity swollen with area of dark red discolored patch  No palpable pulse Skin: left breast with chronic ulcer inferior medially Neurologic: CN 2-12 grossly intact. Sensation intact, Strength 5/5 in all 4.  Psychiatric: Normal judgment and insight. Alert and oriented x 3. Noted to be anxious  Labs on Admission: I have personally reviewed following labs and imaging studies  CBC: Recent Labs  Lab 12/03/21 1721  WBC 7.2  NEUTROABS 5.1  HGB 12.1  HCT 37.9  MCV 93.8  PLT 937*   Basic Metabolic Panel: Recent Labs  Lab 12/03/21 1721  NA 142  K 3.7  CL 107  CO2 26  GLUCOSE 101*  BUN 14  CREATININE 1.01*  CALCIUM 8.9   GFR: Estimated Creatinine Clearance: 31 mL/min (A) (by C-G formula based on SCr of 1.01 mg/dL (H)). Liver Function Tests: No results for input(s): "AST", "ALT", "ALKPHOS", "BILITOT", "PROT", "ALBUMIN" in the last 168 hours. No results for input(s): "LIPASE", "AMYLASE" in the last 168 hours. No results for input(s): "AMMONIA" in the last 168 hours. Coagulation Profile: No results for input(s): "INR", "PROTIME" in the last 168 hours. Cardiac Enzymes: No results for input(s): "CKTOTAL", "CKMB", "CKMBINDEX",  "TROPONINI" in the last 168 hours. BNP (last 3 results) No results for input(s): "PROBNP" in the last 8760 hours. HbA1C: No results for input(s): "HGBA1C" in the last 72 hours. CBG: No results for input(s): "GLUCAP" in the last 168 hours. Lipid Profile: No results for input(s): "CHOL", "HDL", "LDLCALC", "TRIG", "CHOLHDL", "LDLDIRECT" in the last 72 hours. Thyroid Function Tests: No results for input(s): "TSH", "T4TOTAL", "FREET4", "T3FREE", "THYROIDAB" in the last 72 hours. Anemia Panel: No results for input(s): "VITAMINB12", "FOLATE", "FERRITIN", "TIBC", "IRON", "RETICCTPCT" in the last 72 hours. Urine analysis:    Component Value Date/Time   COLORURINE YELLOW (A) 11/09/2021 0207   APPEARANCEUR HAZY (A) 11/09/2021 0207   LABSPEC 1.021 11/09/2021 0207   PHURINE 5.0 11/09/2021 0207   GLUCOSEU NEGATIVE 11/09/2021 0207   HGBUR MODERATE (A) 11/09/2021 0207   BILIRUBINUR NEGATIVE 11/09/2021 0207   KETONESUR NEGATIVE 11/09/2021 0207   PROTEINUR 30 (A) 11/09/2021 0207   NITRITE NEGATIVE 11/09/2021 0207   LEUKOCYTESUR SMALL (A) 11/09/2021 0207    Radiological Exams on Admission: DG Chest Portable 1 View  Result Date: 12/03/2021 CLINICAL DATA:  Shortness of breath and peripheral swelling EXAM: PORTABLE CHEST 1 VIEW COMPARISON:  09/28/2021 FINDINGS: Cardiac shadow is enlarged but stable. Defibrillator is again noted and stable. Right chest wall port is noted in satisfactory position. Lungs are well aerated bilaterally. No focal infiltrate is seen. Mild vascular congestion is noted. IMPRESSION: Changes of mild CHF. Electronically Signed   By: Inez Catalina M.D.   On: 12/03/2021 20:21   US Venous Img Lower Unilateral Left  Result Date: 12/03/2021 CLINICAL DATA:  Swelling EXAM: Left LOWER EXTREMITY VENOUS DOPPLER ULTRASOUND TECHNIQUE: Gray-scale sonography with compression, as well as color and duplex ultrasound, were performed to evaluate the deep venous system(s) from the level of the common  femoral vein through the popliteal and proximal calf veins. COMPARISON:  None Available. FINDINGS: VENOUS Normal compressibility  of the common femoral, superficial femoral, and popliteal veins, as well as the visualized calf veins. Visualized portions of profunda femoral vein and great saphenous vein unremarkable. No filling defects to suggest DVT on grayscale or color Doppler imaging. Doppler waveforms show normal direction of venous flow, normal respiratory plasticity and response to augmentation. Limited views of the contralateral common femoral vein are unremarkable. OTHER None. Limitations: none IMPRESSION: Negative. Electronically Signed   By: Valetta Mole M.D.   On: 12/03/2021 18:12    EKG: Independently reviewed.  Assessment/Plan Left lower extremity ischemic limb  -hx of PVD with abi"severe left lower extremity arterial disease" 01/2019 -admit to progressive care  -continue on heparin initiated in ED -vascular consult Dr Delana Meyer aware , plan intervention in am  -supportive care for pain    Possible Acute on Chronic CHF exacerabtion -s/p AICD -note acute hypoxemia ,requiring 4L in ED sat 95% -lasix x1 given in ED  -bnp elevated / cxr with mild congestion , + lower ext edema  -continue home regimen  - strict I/o and fluid restriction  - repeat prn lasix iv based on response s/p f/u repeat renal labs   Possible PE -+ d-dimer, hypoxemia  - CTPA pending  -adjust heparin dosing as needed base on CTPA result    CAD s/p pci  -resume home cardiac regimen  -routine CE screen noted flat enzymes   GERd -ppi    HTN, Uncontrolled -resume home regimen  -prn as needed   HLD -continue statin   anxiety, breast cancer, chronic systolic CHF ef 14%, coronary artery disease s/p PCI and stent and AICD placement, GERD, hypertension, dyslipidemia,osteoarthritis,  PVD,   DVT prophylaxis: heparin Code Status: full Family Communication: son at bedside Disposition Plan: patient  expected  to be admitted greater than 2 midnights  Consults called: see above Admission status: inpatient   Clance Boll MD [facility]  If 7PM-7AM, please contact night-coverage [email or web address] Password [facility]  12/03/2021, 8:24 PM

## 2021-12-03 NOTE — Consult Note (Signed)
ANTICOAGULATION CONSULT NOTE   Pharmacy Consult for Heparin Indication:  limb ischemia  Allergies  Allergen Reactions   Atorvastatin Other (See Comments)    increased LFT's   Lisinopril Cough   Oxycodone Hcl Itching and Other (See Comments)    Restless & jittery   Simvastatin Other (See Comments)    myalgias    Patient Measurements:   Heparin Dosing Weight: 40.2 kg  Vital Signs: Temp: 98 F (36.7 C) (06/20 1715) Temp Source: Oral (06/20 1715) BP: 187/98 (06/20 1930) Pulse Rate: 81 (06/20 1930)  Labs: Recent Labs    12/03/21 1721  HGB 12.1  HCT 37.9  PLT 118*  CREATININE 1.01*    CrCl cannot be calculated (Unknown ideal weight.).   Medical History: Past Medical History:  Diagnosis Date   Allergic rhinitis    Anxiety    Breast cancer ([place])    11/26/20 Pt reports wound/lesion on left breast   Chronic systolic heart failure (HCC)    Coronary artery disease    GERD (gastroesophageal reflux disease)    Heart murmur    Hyperlipidemia    Hypertension    Implantable defibrillator St Jude    DOI 2006   MI (myocardial infarction) ([place])    Osteoarthritis    Wears dentures    full upper and lower    Medications:  (Not in a hospital admission)  Scheduled:   heparin  4,000 Units Intravenous Once   Infusions:  PRN:  Anti-infectives (From admission, onward)    None       Assessment: Pharmacy consulted to start heparin for an limb ischemia. No note of DOAC PTA.   Goal of Therapy:  Heparin level 0.3-0.7 units/ml Monitor platelets by anticoagulation protocol: Yes   Plan:  Give 2000 units bolus x 1 Start heparin infusion at 500 units/hr Check anti-Xa level in 8 hours and daily while on heparin Continue to monitor H&H and platelets  Oswald Hillock, PharmD, BCPS 12/03/2021,8:03 PM

## 2021-12-03 NOTE — ED Notes (Signed)
Pt helped to bathroom. Pt admits to difficulty breathing when exerting herself. Pt helped back to bed. Provider notified

## 2021-12-03 NOTE — ED Triage Notes (Signed)
Pt endorses bilateral leg and feet swelling that was noted this morning. Left leg is cool to touch. Doppler pulse present in triage.

## 2021-12-03 NOTE — ED Provider Notes (Signed)
[facility] Provider Note    Event Date/Time   First MD Initiated Contact with Patient 12/03/21 1832     (approximate)   History   Leg Swelling   HPI  Christine [name] is a 76 y.o. female who presents to the ED for evaluation of Leg Swelling   I reviewed medical DC summary from 5/28.  She is a history of anxiety, reduced ejection fraction, CAD with AICD in place.  History of PAD on DAPT with Plavix.  I review angiography from September 2020 where she had a renal artery stent placed, but no leg interventions.  She presents to the ED for evaluation of lower leg swelling and pain.  She apparently reports pain with exertion and improved with rest, but reluctantly admits to rest pains in the left foot that been worsening particularly today as well.  Reports she had an episode of chest pain earlier today, but minimizes this.  Reports she is anxious regarding admission and having to stay.  Physical Exam   Triage Vital Signs: ED Triage Vitals  Enc Vitals Group     BP 12/03/21 1712 (!) 168/86     Pulse Rate 12/03/21 1712 73     Resp 12/03/21 1712 20     Temp 12/03/21 1715 98 F (36.7 C)     Temp Source 12/03/21 1715 Oral     SpO2 12/03/21 1712 96 %     Weight --      Height --      Head Circumference --      Peak Flow --      Pain Score 12/03/21 1715 0     Pain Loc --      Pain Edu? --      Excl. in [place]? --     Most recent vital signs: Vitals:   12/03/21 1715 12/03/21 1930  BP:  (!) 187/98  Pulse:  81  Resp:  (!) 21  Temp: 98 F (36.7 C)   SpO2:  97%    General: Awake, no distress.  Thin and frail.  Conversational. CV:  Good peripheral perfusion.  Resp:  Normal effort.  Abd:  No distention.  MSK:  No deformity noted.  Neuro:  No focal deficits appreciated. Other:   Bilateral lower extremity edema.  I have difficulty appreciating any DP pulse bilaterally.  Very prolonged cap refill.  Bruising discoloration to the left leg.  ED Results /  Procedures / Treatments   Labs (all labs ordered are listed, but only abnormal results are displayed) Labs Reviewed  BASIC METABOLIC PANEL - Abnormal; Notable for the following components:      Result Value   Glucose, Bld 101 (*)    Creatinine, Ser 1.01 (*)    GFR, Estimated 58 (*)    All other components within normal limits  CBC WITH DIFFERENTIAL/PLATELET - Abnormal; Notable for the following components:   Platelets 118 (*)    All other components within normal limits  BRAIN NATRIURETIC PEPTIDE  PROTIME-INR  APTT  TROPONIN I (HIGH SENSITIVITY)  TROPONIN I (HIGH SENSITIVITY)    EKG   RADIOLOGY Venous ultrasound of the left leg interpreted by me with no evidence of DVT.  Official radiology report(s): US Venous Img Lower Unilateral Left  Result Date: 12/03/2021 CLINICAL DATA:  Swelling EXAM: Left LOWER EXTREMITY VENOUS DOPPLER ULTRASOUND TECHNIQUE: Gray-scale sonography with compression, as well as color and duplex ultrasound, were performed to evaluate the deep venous system(s) from the level of  the common femoral vein through the popliteal and proximal calf veins. COMPARISON:  None Available. FINDINGS: VENOUS Normal compressibility of the common femoral, superficial femoral, and popliteal veins, as well as the visualized calf veins. Visualized portions of profunda femoral vein and great saphenous vein unremarkable. No filling defects to suggest DVT on grayscale or color Doppler imaging. Doppler waveforms show normal direction of venous flow, normal respiratory plasticity and response to augmentation. Limited views of the contralateral common femoral vein are unremarkable. OTHER None. Limitations: none IMPRESSION: Negative. Electronically Signed   By: Valetta Mole M.D.   On: 12/03/2021 18:12    PROCEDURES and INTERVENTIONS:  .1-3 Lead EKG Interpretation  Performed by: Vladimir Crofts, MD Authorized by: Vladimir Crofts, MD     Interpretation: normal     ECG rate:  80   ECG rate  assessment: normal     Rhythm: sinus rhythm     Ectopy: none     Conduction: normal   .Critical Care  Performed by: Vladimir Crofts, MD Authorized by: Vladimir Crofts, MD   Critical care provider statement:    Critical care time (minutes):  30   Critical care time was exclusive of:  Separately billable procedures and treating other patients   Critical care was necessary to treat or prevent imminent or life-threatening deterioration of the following conditions:  Circulatory failure   Critical care was time spent personally by me on the following activities:  Development of treatment plan with patient or surrogate, discussions with consultants, evaluation of patient's response to treatment, examination of patient, ordering and review of laboratory studies, ordering and review of radiographic studies, ordering and performing treatments and interventions, pulse oximetry, re-evaluation of patient's condition and review of old charts   Medications  heparin injection 4,000 Units (has no administration in time range)     IMPRESSION / MDM / ASSESSMENT AND PLAN / ED COURSE  I reviewed the triage vital signs and the nursing notes.  Differential diagnosis includes, but is not limited to, CHF exacerbation, cellulitis, DVT, leg ischemia  {Patient presents with symptoms of an acute illness or injury that is potentially life-threatening.  76 year old smoker presents to the ED with increased lower extremity swelling and rest pains in the left concerning for lower extremity ischemia requiring heparinization and medical admission.  I consulted with vascular surgery who agrees and can do angiography tomorrow.  Her blood work is generally reassuring with normal CBC and metabolic panel.  We will add on a CXR, troponins and BNP to help tease out if there is any component of CHF considering her reduced ejection fraction as well.  We will consult with medicine for admission.  Clinical Course as of 12/03/21 2011  Tue  Dec 03, 2021  2001 ABI on the right is 0.78. ABI on the left is 0.23. [DS]  2007 I consult with vascular surgeon Dr. Delana Meyer who agrees with starting heparin drip and admitted to medicine.  They can perform angiography tomorrow. [DS]    Clinical Course User Index [DS] Vladimir Crofts, MD     FINAL CLINICAL IMPRESSION(S) / ED DIAGNOSES   Final diagnoses:  Bilateral lower extremity edema  Claudication of both lower extremities ([place])  Acute lower limb ischemia     Rx / DC Orders   ED Discharge Orders     None        Note:  This document was prepared using Dragon voice recognition software and may include unintentional dictation errors.   Vladimir Crofts, MD 12/03/21 2011

## 2021-12-03 NOTE — ED Notes (Signed)
Pt and son state pt woke up with her legs swollen. She doesn't know how that happened. Pt told this nurse she had cancer and then she has a wound she looks after. Pt was not making sense, so this nurse asked pt's son if she has memory issues. He said yes. He said that pt's wound is being managed by a nurse. Pt had labs drawn and IV inserted. Pt placed on heparin. Pt became very agitated and saying she couldn't breathe and she was to hot. Pt said she was having a panic attack. Pt given ativan, O2 turned up, and fan placed to cool her down. Pt is laying down now.

## 2021-12-04 ENCOUNTER — Inpatient Hospital Stay (HOSPITAL_COMMUNITY)
Admit: 2021-12-04 | Discharge: 2021-12-04 | Disposition: A | Discharge status: Home / Self Care | Payer: Medicare Other | Attending: Internal Medicine | Admitting: Internal Medicine

## 2021-12-04 ENCOUNTER — Encounter
Admission: EM | Disposition: A | Discharge status: Home Health | Payer: Self-pay | Source: Home / Self Care | Attending: Osteopathic Medicine

## 2021-12-04 ENCOUNTER — Other Ambulatory Visit: Payer: Self-pay

## 2021-12-04 ENCOUNTER — Inpatient Hospital Stay: Payer: Medicare Other

## 2021-12-04 DIAGNOSIS — R0609 Other forms of dyspnea: Secondary | ICD-10-CM

## 2021-12-04 DIAGNOSIS — I70222 Atherosclerosis of native arteries of extremities with rest pain, left leg: Secondary | ICD-10-CM

## 2021-12-04 DIAGNOSIS — I700 Atherosclerosis of aorta: Secondary | ICD-10-CM

## 2021-12-04 DIAGNOSIS — I5020 Unspecified systolic (congestive) heart failure: Secondary | ICD-10-CM

## 2021-12-04 LAB — COMPREHENSIVE METABOLIC PANEL
ALT: 16 U/L (ref 0–44)
AST: 35 U/L (ref 15–41)
Albumin: 3.8 g/dL (ref 3.5–5.0)
Alkaline Phosphatase: 54 U/L (ref 38–126)
Anion gap: 9 (ref 5–15)
BUN: 18 mg/dL (ref 8–23)
CO2: 30 mmol/L (ref 22–32)
Calcium: 8.8 mg/dL — ABNORMAL LOW (ref 8.9–10.3)
Chloride: 104 mmol/L (ref 98–111)
Creatinine, Ser: 1.01 mg/dL — ABNORMAL HIGH (ref 0.44–1.00)
GFR, Estimated: 58 mL/min — ABNORMAL LOW (ref >60)
Glucose, Bld: 109 mg/dL — ABNORMAL HIGH (ref 70–99)
Potassium: 4.5 mmol/L (ref 3.5–5.1)
Sodium: 143 mmol/L (ref 135–145)
Total Bilirubin: 1.3 mg/dL — ABNORMAL HIGH (ref 0.3–1.2)
Total Protein: 6.7 g/dL (ref 6.5–8.1)

## 2021-12-04 LAB — ECHOCARDIOGRAM COMPLETE
AV Mean grad: 1 mmHg
AV Peak grad: 2.5 mmHg
Ao pk vel: 0.79 m/s
Area-P 1/2: 4.89 cm2
Calc EF: 17.9 %
Single Plane A2C EF: 16.3 %
Single Plane A4C EF: 18.7 %
Weight: 1419.2 oz

## 2021-12-04 LAB — RESPIRATORY PANEL BY PCR

## 2021-12-04 LAB — PROTIME-INR
INR: 1.3 — ABNORMAL HIGH (ref 0.8–1.2)
Prothrombin Time: 16.3 seconds — ABNORMAL HIGH (ref 11.4–15.2)

## 2021-12-04 LAB — CBC
HCT: 37.4 % (ref 36.0–46.0)
Hemoglobin: 11.9 g/dL — ABNORMAL LOW (ref 12.0–15.0)
MCH: 29.5 pg (ref 26.0–34.0)
MCHC: 31.8 g/dL (ref 30.0–36.0)
MCV: 92.6 fL (ref 80.0–100.0)
Platelets: 118 10*3/uL — ABNORMAL LOW (ref 150–400)
RBC: 4.04 MIL/uL (ref 3.87–5.11)
RDW: 14.2 % (ref 11.5–15.5)
WBC: 13.7 10*3/uL — ABNORMAL HIGH (ref 4.0–10.5)
nRBC: 0 % (ref 0.0–0.2)

## 2021-12-04 LAB — BASIC METABOLIC PANEL
Anion gap: 10 (ref 5–15)
BUN: 17 mg/dL (ref 8–23)
CO2: 27 mmol/L (ref 22–32)
Calcium: 8.7 mg/dL — ABNORMAL LOW (ref 8.9–10.3)
Chloride: 105 mmol/L (ref 98–111)
Creatinine, Ser: 0.88 mg/dL (ref 0.44–1.00)
GFR, Estimated: >60 mL/min (ref >60)
Glucose, Bld: 122 mg/dL — ABNORMAL HIGH (ref 70–99)
Potassium: 3.9 mmol/L (ref 3.5–5.1)
Sodium: 142 mmol/L (ref 135–145)

## 2021-12-04 LAB — HEPARIN LEVEL (UNFRACTIONATED)
Heparin Unfractionated: >1.1 IU/mL — ABNORMAL HIGH (ref 0.30–0.70)
Heparin Unfractionated: 0.14 IU/mL — ABNORMAL LOW (ref 0.30–0.70)

## 2021-12-04 LAB — TROPONIN I (HIGH SENSITIVITY)
Troponin I (High Sensitivity): 128 ng/L (ref <18)
Troponin I (High Sensitivity): 154 ng/L (ref <18)
Troponin I (High Sensitivity): 145 ng/L (ref <18)

## 2021-12-04 LAB — T4, FREE: Free T4: 1.19 ng/dL — ABNORMAL HIGH (ref 0.61–1.12)

## 2021-12-04 LAB — LACTIC ACID, PLASMA
Lactic Acid, Venous: 1.4 mmol/L (ref 0.5–1.9)
Lactic Acid, Venous: 1.8 mmol/L (ref 0.5–1.9)

## 2021-12-04 SURGERY — LOWER EXTREMITY ANGIOGRAPHY
Anesthesia: Moderate Sedation | Laterality: Left

## 2021-12-04 MED ORDER — SODIUM CHLORIDE 0.9 % IV SOLN: 250.0000 mL | INTRAVENOUS | Status: DC | PRN

## 2021-12-04 MED ORDER — IODIXANOL 320 MG/ML IV SOLN
INTRAVENOUS | Status: DC | PRN
  Administered 2021-12-04: 70 mL via INTRA_ARTERIAL

## 2021-12-04 MED ORDER — CEFAZOLIN SODIUM-DEXTROSE 2-4 GM/100ML-% IV SOLN
INTRAVENOUS | Status: AC
  Filled 2021-12-04: qty 100

## 2021-12-04 MED ORDER — SODIUM CHLORIDE 0.9% FLUSH
3.0000 mL | Freq: Two times a day (BID) | INTRAVENOUS | Status: DC
Start: 2021-12-04 — End: 2021-12-06
  Administered 2021-12-05: 3 mL via INTRAVENOUS

## 2021-12-04 MED ORDER — TAMOXIFEN CITRATE 10 MG PO TABS
20.0000 mg | ORAL_TABLET | Freq: Every day | ORAL | Status: DC
  Administered 2021-12-04 – 2021-12-05 (×2): 20 mg via ORAL
  Filled 2021-12-04 (×2): qty 2

## 2021-12-04 MED ORDER — PANTOPRAZOLE SODIUM 40 MG PO TBEC
40.0000 mg | DELAYED_RELEASE_TABLET | Freq: Every day | ORAL | Status: DC
  Administered 2021-12-04 – 2021-12-05 (×2): 40 mg via ORAL
  Filled 2021-12-04 (×2): qty 1

## 2021-12-04 MED ORDER — PERFLUTREN LIPID MICROSPHERE
1.0000 mL | INTRAVENOUS | Status: AC | PRN
  Administered 2021-12-04: 3 mL via INTRAVENOUS

## 2021-12-04 MED ORDER — HEPARIN (PORCINE) 25000 UT/250ML-% IV SOLN
900.0000 [IU]/h | INTRAVENOUS | Status: DC
  Administered 2021-12-05: 900 [IU]/h via INTRAVENOUS
  Filled 2021-12-04: qty 250

## 2021-12-04 MED ORDER — CEFAZOLIN SODIUM-DEXTROSE 2-4 GM/100ML-% IV SOLN: 2.0000 g | Freq: Once | INTRAVENOUS | Status: DC

## 2021-12-04 MED ORDER — ONDANSETRON HCL 4 MG/2ML IJ SOLN: 4.0000 mg | Freq: Four times a day (QID) | INTRAMUSCULAR | Status: DC | PRN

## 2021-12-04 MED ORDER — ROSUVASTATIN CALCIUM 10 MG PO TABS
10.0000 mg | ORAL_TABLET | Freq: Every day | ORAL | Status: DC
  Administered 2021-12-04 – 2021-12-05 (×2): 10 mg via ORAL
  Filled 2021-12-04 (×2): qty 1

## 2021-12-04 MED ORDER — BISACODYL 5 MG PO TBEC
5.0000 mg | DELAYED_RELEASE_TABLET | Freq: Every day | ORAL | Status: DC | PRN
Start: 2021-12-04 — End: 2021-12-06

## 2021-12-04 MED ORDER — HEPARIN SODIUM (PORCINE) 1000 UNIT/ML IJ SOLN
INTRAMUSCULAR | Status: AC
  Filled 2021-12-04: qty 10

## 2021-12-04 MED ORDER — MIDAZOLAM HCL 2 MG/2ML IJ SOLN
INTRAMUSCULAR | Status: DC | PRN
  Administered 2021-12-04: 1 mg via INTRAVENOUS

## 2021-12-04 MED ORDER — LORAZEPAM 0.5 MG PO TABS: 0.2500 mg | ORAL_TABLET | Freq: Two times a day (BID) | ORAL | Status: DC | PRN

## 2021-12-04 MED ORDER — OXYCODONE HCL 5 MG PO TABS: 5.0000 mg | ORAL_TABLET | ORAL | Status: DC | PRN

## 2021-12-04 MED ORDER — SODIUM CHLORIDE 0.9% FLUSH: 3.0000 mL | INTRAVENOUS | Status: DC | PRN

## 2021-12-04 MED ORDER — IRBESARTAN 150 MG PO TABS
300.0000 mg | ORAL_TABLET | Freq: Every day | ORAL | Status: DC
  Administered 2021-12-04 – 2021-12-05 (×2): 300 mg via ORAL
  Filled 2021-12-04 (×2): qty 2

## 2021-12-04 MED ORDER — HEPARIN SODIUM (PORCINE) 1000 UNIT/ML IJ SOLN
INTRAMUSCULAR | Status: DC | PRN
  Administered 2021-12-04: 4000 [IU] via INTRAVENOUS

## 2021-12-04 MED ORDER — MIDAZOLAM HCL 2 MG/2ML IJ SOLN
INTRAMUSCULAR | Status: AC
  Filled 2021-12-04: qty 2

## 2021-12-04 MED ORDER — SODIUM CHLORIDE 0.9 % IV SOLN: INTRAVENOUS | Status: AC

## 2021-12-04 MED ORDER — MORPHINE SULFATE (PF) 4 MG/ML IV SOLN: 2.0000 mg | INTRAVENOUS | Status: DC | PRN

## 2021-12-04 MED ORDER — POLYETHYLENE GLYCOL 3350 17 G PO PACK
17.0000 g | PACK | Freq: Every day | ORAL | Status: DC | PRN
Start: 2021-12-04 — End: 2021-12-06

## 2021-12-04 MED ORDER — TRAMADOL HCL 50 MG PO TABS
25.0000 mg | ORAL_TABLET | Freq: Two times a day (BID) | ORAL | Status: DC
  Administered 2021-12-04 – 2021-12-05 (×2): 25 mg via ORAL
  Filled 2021-12-04 (×2): qty 1

## 2021-12-04 MED ORDER — ACETAMINOPHEN 325 MG PO TABS: 650.0000 mg | ORAL_TABLET | ORAL | Status: DC | PRN

## 2021-12-04 MED ORDER — FENTANYL CITRATE PF 50 MCG/ML IJ SOSY
PREFILLED_SYRINGE | INTRAMUSCULAR | Status: AC
  Filled 2021-12-04: qty 1

## 2021-12-04 MED ORDER — SODIUM CHLORIDE 0.9 % IV SOLN: INTRAVENOUS | Status: DC

## 2021-12-04 MED ORDER — HEPARIN BOLUS VIA INFUSION
1200.0000 [IU] | Freq: Once | INTRAVENOUS | Status: AC
  Administered 2021-12-04: 1200 [IU] via INTRAVENOUS
  Filled 2021-12-04: qty 1200

## 2021-12-04 MED ORDER — CARVEDILOL 25 MG PO TABS
25.0000 mg | ORAL_TABLET | Freq: Two times a day (BID) | ORAL | Status: DC
  Administered 2021-12-04 – 2021-12-05 (×2): 25 mg via ORAL
  Filled 2021-12-04 (×2): qty 1

## 2021-12-04 MED ORDER — FENTANYL CITRATE (PF) 100 MCG/2ML IJ SOLN
INTRAMUSCULAR | Status: DC | PRN
  Administered 2021-12-04: 25 ug via INTRAVENOUS

## 2021-12-04 SURGICAL SUPPLY — 24 items
BALLN DORADO 7X40X80 (BALLOONS) ×2
BALLN LUTONIX DCB 6X60X130 (BALLOONS) ×2
BALLOON DORADO 7X40X80 (BALLOONS) IMPLANT
BALLOON LUTONIX DCB 6X60X130 (BALLOONS) IMPLANT
CATH ANGIO 5F PIGTAIL 65CM (CATHETERS) ×1 IMPLANT
COVER PROBE U/S 5X48 (MISCELLANEOUS) ×1 IMPLANT
DEVICE STARCLOSE SE CLOSURE (Vascular Products) ×1 IMPLANT
DEVICE TORQUE .025-.038 (MISCELLANEOUS) ×1 IMPLANT
GLIDESHEATH SLENDER 7FR .021G (SHEATH) ×1 IMPLANT
GLIDEWIRE ADV .035X260CM (WIRE) ×1 IMPLANT
GLIDEWIRE STIFF .35X180X3 HYDR (WIRE) ×1 IMPLANT
INTRODUCER 7FR 23CM (INTRODUCER) ×1 IMPLANT
KIT ENCORE 26 ADVANTAGE (KITS) ×1 IMPLANT
NDL ENTRY 21GA 7CM ECHOTIP (NEEDLE) IMPLANT
NEEDLE ENTRY 21GA 7CM ECHOTIP (NEEDLE) ×2 IMPLANT
PACK ANGIOGRAPHY (CUSTOM PROCEDURE TRAY) ×2 IMPLANT
SET INTRO CAPELLA COAXIAL (SET/KITS/TRAYS/PACK) ×1 IMPLANT
SHEATH BRITE TIP 5FRX11 (SHEATH) ×1 IMPLANT
SHEATH BRITE TIP 6FRX11 (SHEATH) ×1 IMPLANT
STENT LIFESTAR 8X60X80 (Permanent Stent) ×1 IMPLANT
STENT LIFESTREAM 7X58X80 (Permanent Stent) ×1 IMPLANT
SYR MEDRAD MARK 7 150ML (SYRINGE) ×1 IMPLANT
TUBING CONTRAST HIGH PRESS 72 (TUBING) ×1 IMPLANT
WIRE GUIDERIGHT .035X150 (WIRE) ×1 IMPLANT

## 2021-12-04 NOTE — Assessment & Plan Note (Signed)
   Home Rx continued: Carvedilol 25 mg twice daily, irbesartan 300 mg daily substituted for home valsartan, rosuvastatin 10 mg daily

## 2021-12-04 NOTE — ED Notes (Addendum)
Pharmacy contacted this nurse saying that the Heparin unfraction was contaminated with Heparin because it was to high. This draw came off a brand new IV placed by the IV team. Lab was contacted so that the phlebotomy team could come and draw the lab as pt has been stuck numerous times through the night attempting Ivs and draws. Was told that the phlebotomy had numerous pts so it would be a while but a note would be made to have it done

## 2021-12-04 NOTE — ED Notes (Signed)
Patient has wound on left chest area, dressing was saturated with drainage. Wound cleaned with saline and dressing changed. Wound consult placed.

## 2021-12-04 NOTE — Progress Notes (Signed)
Changed diet to a dysphagia 3 diet due to difficulty chewing (patient does not have her dentures since they don't fit as well)

## 2021-12-04 NOTE — Progress Notes (Signed)
ABG puncture was attempted, patient jerked her arm twice, RN assisted, and sample was then obtained, but due to repeated non compliance from patient the sample appears very dark. Possibly venous. Unsafe blood draw on this patient due to confused and uncooperative response, potential for needle stick injury on staff.  VBG results available, see results.

## 2021-12-04 NOTE — Consult Note (Signed)
WOC Nurse Consult Note: Reason for Consult:Chronic, nonhealing full thickness wound to left chest. Seen previously in the outpatient wound clinic by PA-C, Kelli Churn, III.  There have been no changes to this wound in several years. Patient sometimes wears a dressing, sometimes does not.  Wound type: Malignancy Pressure Injury POA: N/A Measurement:Last measurement was 5cm x 3cm with approximately 0.2cm depth Wound bed:red, moist Drainage (amount, consistency, odor) moderate serous drainage Periwound: intact, closed wound edges (epibole) Dressing procedure/placement/frequency: I have provided Nursing with guidance for the daily topical care of this wound using a silver hydrofiber (something the patient has used in the past). This is to be applied after cleansing the wound with NS (patient may use soap and water at home and rinse) and patting dry. It is to be covered with a dry gauze dressing and secured with paper tape. Changes are daily and PRN dressing dislodgement. Note: if dressing is adherent, may irrigate with NS prior to removal for atraumatic changes. A sacral prophylactic foam dressing is to be placed for PI prevention. As patient is in house for an arteriogram, heels are to be floated for PI prevention.  [place] nursing team will not follow, but will remain available to this patient, the nursing and medical teams.  Please re-consult if needed.  Thank you for inviting [place] to participate in this patient's Plan of Care.  Maudie Flakes, MSN, RN, CNS, [place], Serita Grammes, [employer], [employer] phone:  [phone]

## 2021-12-04 NOTE — Consult Note (Signed)
ANTICOAGULATION CONSULT NOTE   Pharmacy Consult for Heparin Indication:  limb ischemia  Allergies  Allergen Reactions   Atorvastatin Other (See Comments)    increased LFT's   Lisinopril Cough   Oxycodone Hcl Itching and Other (See Comments)    Restless & jittery   Simvastatin Other (See Comments)    myalgias    Patient Measurements: Weight: 40.2 kg (88 lb 11.2 oz) Heparin Dosing Weight: 40.2 kg  Vital Signs: BP: 153/83 (06/21 0400) Pulse Rate: 65 (06/21 0630)  Labs: Recent Labs    12/03/21 1721 12/03/21 2005 12/03/21 2143 12/04/21 0137 12/04/21 0357 12/04/21 0530 12/04/21 0638  HGB 12.1  --   --   --   --  11.9*  --   HCT 37.9  --   --   --   --  37.4  --   PLT 118*  --   --   --   --  118*  --   APTT  --  31  --   --   --   --   --   LABPROT  --  14.8  --   --   --  16.3*  --   INR  --  1.2  --   --   --  1.3*  --   HEPARINUNFRC  --   --   --   --   --  >1.10* 0.14*  CREATININE 1.01*  --   --  0.88 1.01*  --   --   TROPONINIHS 24* 28*   < > 128* 154* 145*  --    < > = values in this interval not displayed.     Estimated Creatinine Clearance: 31 mL/min (A) (by C-G formula based on SCr of 1.01 mg/dL (H)).   Medical History: Past Medical History:  Diagnosis Date   Allergic rhinitis    Anxiety    Breast cancer ([place])    11/26/20 Pt reports wound/lesion on left breast   Chronic systolic heart failure ([facility])    Coronary artery disease    GERD (gastroesophageal reflux disease)    Heart murmur    Hyperlipidemia    Hypertension    Implantable defibrillator [employer]    DOI 2006   MI (myocardial infarction) ([place])    Osteoarthritis    Wears dentures    full upper and lower    Medications:  (Not in a hospital admission) Scheduled:   heparin  1,200 Units Intravenous Once   heparin  2,000 Units Intravenous Once   Infusions:   heparin 500 Units/hr (12/04/21 0630)   PRN:  Anti-infectives (From admission, onward)    Start     Dose/Rate Route Frequency  Ordered Stop   12/04/21 0600  ceFAZolin (ANCEF) IVPB 2g/100 mL premix        2 g 200 mL/hr over 30 Minutes Intravenous On call to O.R. 12/03/21 2133 12/04/21 2979       Assessment: Pharmacy consulted to start heparin for an limb ischemia. No note of DOAC PTA.   Goal of Therapy:  Heparin level 0.3-0.7 units/ml Monitor platelets by anticoagulation protocol: Yes   Plan:  6/21:  HL @ 0530 = >1.10  Spoke with RN who said having issues with pt IV; IV team had to place a new line and all labs were drawn from line so in theory the RN feels it should have been a clean draw.   Nonetheless, will repeat HL to confirm result is valid.   New  HL to be drawn at 0610.   6/21:  HL @ 0786 = 0.14, SUBtherapeutic  Will order heparin 1200 units IV X 1 bolus and increase drip rate to 650 units/hr.  Will recheck HL 8 hrs after rate change.   Yuritzy Zehring D, PharmD 12/04/2021,7:21 AM

## 2021-12-04 NOTE — Op Note (Signed)
[facility]  Percutaneous Study/Intervention Procedural Note   Date of Surgery: 12/04/2021  Surgeon:Siobahn Worsley, Dolores Lory   Pre-operative Diagnosis: Atherosclerotic occlusive disease bilateral lower extremities with left leg ischemic rest pain  Post-operative diagnosis:  Same  Procedure(s) Performed:  1.  Abdominal aortogram with left lower extremity distal runoff  2.  Percutaneous transluminal angioplasty and stent placement left external iliac artery to 6 mm  3.  Percutaneous transluminal angioplasty infrarenal aorta to 8 mm  4.  Percutaneous transluminal and plasty and stent placement left common iliac artery  5.  Ultrasound guided access left common femoral artery  6.  StarClose closure device common femoral artery  Anesthesia: Conscious sedation was administered under my direct supervision by the interventional radiology RN. IV Versed plus fentanyl were utilized. Continuous ECG, pulse oximetry and blood pressure was monitored throughout the entire procedure. Conscious sedation was for a total of 47 minutes.  Sheath: 23 cm 7 [place] Pinnacle sheaths retrograde common femoral arteries  Contrast: 70 cc  Fluoroscopy Time: 7.3 minutes  Indications: Patient presents with increasing pain of both lower extremities but significantly worse ischemic symptoms on the left.  Physical examination as well as noninvasive studies demonstrate significant atherosclerotic occlusive disease with signs consistent with ischemia of the left lower extremity.  Angiography with hope for intervention has been recommended.  Risks and benefits have been reviewed all questions have been answered alternative therapies have also been discussed patient has agreed to proceed.  Procedure:  Christine [name] a 76 y.o. female who was identified and appropriate procedural time out was performed.  The patient was then placed supine on the table and prepped and draped in the usual sterile fashion.   Ultrasound was used to evaluate the right common femoral artery.  It was echolucent and pulsatile indicating it is patent .  An ultrasound image was acquired for the permanent record.  A micropuncture needle was used to access the right common femoral artery under direct ultrasound guidance.  The microwire was then advanced under fluoroscopic guidance but would not advance past the distal external iliac and I was unable to achieve enough wire purchase to even place the sheath I tried using the dilator from the micropuncture but even with this I was still not able to advance the wire adequately to achieve purchase and therefore the wire was removed and pressure was held for 5 minutes.  I then turned my attention to the left common femoral which appeared echolucent however it was not pulsatile.  An image was recorded for the permanent record.  Using the ultrasound in a sterile sleeve I was able to access the left common femoral artery with a microneedle under direct ultrasound visualization.  The microwire was advanced under fluoroscopy without difficulty followed by the micro-sheath.  A 0.035 J wire would not advance but I was able to use a stiff angled Glidewire under fluoroscopic guidance and the wire was advanced without resistance into the aorta and a 5Fr sheath was placed.    The pigtail catheter was then positioned at the level of T12 and an AP image of the aorta was obtained. After review the images the pigtail catheter was repositioned above the aortic bifurcation and bilateral oblique views of the pelvis were obtained.   5000 units of heparin was given and allowed to circulate for proximally 4 minutes.  The 5 French sheath was then upsized to a 6 [place] sheath and ultimately a 7 French 23 cm Pinnacle sheath as well  over an advantage wire. Magnified images of the mid infrarenal aorta were then made and I addressed the most proximal hemodynamically significant lesion first.  The mid infrarenal aorta  demonstrates a focal napkin ringlike lesion that is greater than 90% stenotic.  I advanced an 8 mm x 40 mm [place] balloon across this lesion and inflated to 12 atm for 1 minute.  Follow-up imaging demonstrated a marked improvement with an 8 mm lumen which is an excellent result.  I did not feel that a larger balloon in this location would be safer and excepted this 8 mm lumen.  The detector was then repositioned slightly and images of the aortic bifurcation were then made using hand injection contrast from the femoral sheaths. After appropriate sizing a 7 mm x 58 mm lifestream stent was selected for the left common iliac.  Using the LAO view I was able to line the stent up so that its leading edge was 1 to 2 mm above the origin of the left common iliac artery so that I would not obstruct or impede the lumen of the right common iliac artery.  Stent was deployed without difficulty to 12 atm for approximately 30 seconds.  Follow-up imaging demonstrated an excellent result with less than 10% residual stenosis and there was no compromise to the origin of the right.  I then proceeded with magnified imaging of the left external iliac artery and selected an 8 mm x 60 mm life star stent this was deployed extending from the common iliac stent to the previously placed distal external iliac stent.  It was then postdilated with a 6 mm x 60 mm Lutonix drug-eluting balloon inflated to 12 atm for 1 full minute.  Follow-up imaging demonstrated less than 10% residual stenosis and I proceeded with distal runoff.  Using hand injection through the 7 French sheath on the left I performed the distal runoff.  After review these images an LAO projection of the left groin was obtained and a Star close device deployed without difficulty.   The patient tolerated procedure well and was taken to recovery in improved condition.     Findings:   Aortogram:  The abdominal aorta is opacified with a bolus injection contrast.  There is profound  atherosclerotic changes to the entire abdominal and pelvic vascular system.  The visceral segment of the abdominal aorta is diseased but free of hemodynamically significant stenoses.  The right renal artery stent is noted and is widely patent left renal artery is not visualized.  In the mid infrarenal aorta there is a napkin ring greater than 90% stenosis distal to this is the previously noted aneurysm it appears densely calcified on fluoroscopy and contrast seems to fill to the edges of the calcium indicating minimal thrombus.  This may in fact be poststenotic dilatation from this critical aortic lesion.  Distal to this aneurysmal sac there is diffuse atherosclerotic changes the aorta is of a more normal caliber but there are no hemodynamically significant lesions.  The aortic bifurcation is patent and the origins of both the right and left common iliac arteries appear to be of a reasonable caliber.  Right Lower Extremity: There is a diffuse proximal 70% stenosis of the right common iliac artery just distal to its origin.  The internal iliac artery on the right is patent but severely diseased.  The right external iliac artery demonstrates hemodynamically significant lesions throughout its entire length with a 2 to 3 cm long occlusion just proximal to the level of  the ileal inguinal ligament.  The right common femoral artery appears patent.  Left Lower Extremity: The left common iliac artery although of normal caliber for its proximal 3 or 4 mm then demonstrates a greater than 90% stenosis that extends over 10 to 15 mm distal to this lesion the left common iliac artery is diffusely diseased and greater than 60% stenotic.  There is a subtotal occlusion at the origin of the internal iliac artery on the left with very poor distal filling.  There is greater than 90% diffuse atherosclerotic stenosis of the external iliac artery from its origin down to the existing stent.  Although the leading edge of the stent  demonstrates a 90% stenosis the distal portion of the stent remains widely patent.  The left common femoral artery actually appears patent and free of hemodynamically significant stenosis the left profunda femoris is widely patent in its origin and appears to fill quite nicely.  The superficial femoral artery is a flush occlusion and remains occluded throughout the entire length of the SFA as well as the above-knee popliteal.  Profunda femoris collaterals reconstitute the at knee popliteal which then fills the tibioperoneal trunk and there is a faint peroneal which appears patent in its proximal two thirds but is not visualized distally the posterior tibial is nonvisualized in its entirety.  The anterior tibial is visualized and very delayed films seemingly reconstituted from other branches but it appears to be occluded at its origin as it does not fill at all as the tibioperoneal trunk and peroneal are filling.  Following angioplasty of the aortic lesion to 8 mm there is now an 8 mm lumen although this is undersized I did not feel that attempting to use larger balloons in this setting is safe an 8 mm lumen should be more than adequate to relieve rest pain and distal ischemia given that there is no tissue loss.  Following angioplasty and stent placement of the left common iliac artery there is now less than 10% residual stenosis the stent is well-placed and does not impede on the right common iliac at all.  Following angioplasty study and stent placement of the left external iliac there again is now less than 10% residual stenosis with rapid flow of contrast into the left common femoral.  Summary:  Successful reconstruction of the distal aorta and left iliac arteries which should suffice to alleviate the patient from her rest pain given this profound disease and the patient's frail condition with numerous comorbidities I am hopeful that this reconstruction will offer palliation for the time being.  However, I  have rarely seen such a tremendous amount of atherosclerotic disease and further treatment options are certainly quite limited  Disposition: Patient was taken to the recovery room in stable condition having tolerated the procedure well.  Christine [name] 12/04/2021,4:44 PM

## 2021-12-04 NOTE — Progress Notes (Signed)
Admission profile updated. ?

## 2021-12-04 NOTE — Assessment & Plan Note (Addendum)
BNP elevated, LE edema, CXR changes concerning for exacerbation Echo 09/28/2021 EF 30-35, repeat echo this admission was worse at 25-30 but pt fluid status responded do diuresis  S/p AICD Follow w/ cardiology outpatient Confers poor prognosis and high risk

## 2021-12-04 NOTE — Assessment & Plan Note (Addendum)
hx of PVD with ABI "severe left lower extremity arterial disease" 01/2019  Heparin --> ASA + Plavix outpatient   supportive care for pain  Op note from Dr [name]: "Successful reconstruction of the distal aorta and left iliac arteries which should suffice to alleviate the patient from her rest pain given this profound disease and the patient's frail condition with numerous comorbidities I am hopeful that this reconstruction will offer palliation for the time being.  However, I have rarely seen such a tremendous amount of atherosclerotic disease and further treatment options are certainly quite limited"

## 2021-12-04 NOTE — ED Notes (Signed)
Upon arriving at pt's room, pt was sitting in bed with all leads removed. Pt had also removed Iv from arm. Pt seems confused which could be from ativan that she had from panic attack earlier. Charge nurse was asked for a sitter. Fall band was put in place.

## 2021-12-04 NOTE — Progress Notes (Signed)
*  PRELIMINARY RESULTS* Echocardiogram 2D Echocardiogram has been performed.  Christine [name] 12/04/2021, 10:04 AM

## 2021-12-04 NOTE — Consult Note (Signed)
$'@LOGO'Q$ @   MRN : [id number]  Christine [name] is a 76 y.o. (12-Aug-1945) female who presents with chief complaint of check circulation.  History of Present Illness:  I am asked to evaluate the patient by Dr. Vladimir Crofts.  Patient is a 76 year old woman who presented to [facility] yesterday evening with a complaint of increasing pain in her foot.  On evaluation in the ER she was noted out of cold pale foot with poor capillary refill.  Doppler signals were obtainable and they were very faint.  Motor and sensory were still intact.  Patient is followed by my service in the past and has documented atherosclerotic occlusive disease of the lower extremities.  The patient is increasingly confused today but last night reported that there has been a significant deterioration in the lower extremity symptoms.  The patient notes interval shortening of their claudication distance and development of mild rest pain symptoms. No new ulcers or wounds have occurred since the last visit.  There have been no significant changes to the patient's overall health care.  The patient denies amaurosis fugax or recent TIA symptoms. There are no recent neurological changes noted. There is no history of DVT, PE or superficial thrombophlebitis. The patient denies recent episodes of angina or shortness of breath.    Current Meds  Medication Sig   aspirin EC 81 MG tablet Take 81 mg by mouth daily.   bisacodyl (DULCOLAX) 5 MG EC tablet Take 5 mg by mouth daily as needed for mild constipation or moderate constipation.   carvedilol (COREG) 25 MG tablet TAKE 1 TABLET BY MOUTH TWICE A DAY (Patient taking differently: Take 25 mg by mouth 2 (two) times daily with a meal.)   clopidogrel (PLAVIX) 75 MG tablet TAKE 1 TABLET BY MOUTH ONCE DAILY (Patient taking differently: Take 75 mg by mouth daily.)   hydrOXYzine (ATARAX) 25 MG tablet Take 25 mg by mouth 3 (three) times daily.   LORazepam (ATIVAN)  0.5 MG tablet Take 0.25 mg by mouth 2 (two) times daily.    pantoprazole (PROTONIX) 40 MG tablet Take 40 mg by mouth daily.    Polyethylene Glycol 3350 (PEG 3350) POWD Take 17 g by mouth daily as needed (constipation.).    rosuvastatin (CRESTOR) 10 MG tablet Take 10 mg by mouth daily.    tamoxifen (NOLVADEX) 20 MG tablet Take 1 tablet (20 mg total) by mouth daily.   traMADol (ULTRAM) 50 MG tablet Take 25 mg by mouth 2 (two) times daily.   valsartan (DIOVAN) 320 MG tablet Take 320 mg by mouth daily at 12 noon.     Past Medical History:  Diagnosis Date   Allergic rhinitis    Anxiety    Breast cancer ([place])    11/26/20 Pt reports wound/lesion on left breast   Chronic systolic heart failure ([place])    Coronary artery disease    GERD (gastroesophageal reflux disease)    Heart murmur    Hyperlipidemia    Hypertension    Implantable defibrillator [employer]    DOI 2006   MI (myocardial infarction) [facility])    Osteoarthritis    Wears dentures    full upper and lower    Past Surgical History:  Procedure Laterality Date   CHF exacerbation  10/07   CORONARY STENT PLACEMENT  8/06   3 stents; myocardial infarction   LOWER EXTREMITY ANGIOGRAPHY Left 02/15/2019   Procedure: LOWER EXTREMITY  ANGIOGRAPHY;  Surgeon: Katha Cabal, MD;  Location: [facility];  Service: Cardiovascular;  Laterality: Left;   NM MYOVIEW LTD  1/08   EF 39%, no sx ischemia   PACEMAKER PLACEMENT  2/07   Defibrillator   RENAL ARTERY STENT Left     Social History Social History   Tobacco Use   Smoking status: Every Day    Packs/day: 0.50    Years: 12.00    Total pack years: 6.00    Types: Cigarettes   Smokeless tobacco: Never   Tobacco comments:       Vaping Use   Vaping Use: Never used  Substance Use Topics   Alcohol use: No    Comment: 2-3 times per month   Drug use: No    Family History Family History  Problem Relation Age of Onset   Stroke Mother 39   Heart attack Father    Coronary  artery disease Father    Heart attack Brother    Coronary artery disease Other    Hypertension Other    Cancer Neg Hx        No breast or colon cancer    Allergies  Allergen Reactions   Atorvastatin Other (See Comments)    increased LFT's   Lisinopril Cough   Oxycodone Hcl Itching and Other (See Comments)    Restless & jittery   Simvastatin Other (See Comments)    myalgias     REVIEW OF SYSTEMS (Negative unless checked)  Constitutional: '[]'$ Weight loss  '[]'$ Fever  '[]'$ Chills Cardiac: '[]'$ Chest pain   '[]'$ Chest pressure   '[]'$ Palpitations   '[]'$ Shortness of breath when laying flat   '[]'$ Shortness of breath with exertion. Vascular:  '[x]'$ Pain in legs with walking   '[]'$ Pain in legs at rest  '[]'$ History of DVT   '[]'$ Phlebitis   '[]'$ Swelling in legs   '[]'$ Varicose veins   '[]'$ Non-healing ulcers Pulmonary:   '[]'$ Uses home oxygen   '[]'$ Productive cough   '[]'$ Hemoptysis   '[]'$ Wheeze  '[]'$ COPD   '[]'$ Asthma Neurologic:  '[]'$ Dizziness   '[]'$ Seizures   '[]'$ History of stroke   '[]'$ History of TIA  '[]'$ Aphasia   '[]'$ Vissual changes   '[]'$ Weakness or numbness in arm   '[]'$ Weakness or numbness in leg Musculoskeletal:   '[]'$ Joint swelling   '[]'$ Joint pain   '[]'$ Low back pain Hematologic:  '[]'$ Easy bruising  '[]'$ Easy bleeding   '[]'$ Hypercoagulable state   '[]'$ Anemic Gastrointestinal:  '[]'$ Diarrhea   '[]'$ Vomiting  '[x]'$ Gastroesophageal reflux/heartburn   '[]'$ Difficulty swallowing. Genitourinary:  '[]'$ Chronic kidney disease   '[]'$ Difficult urination  '[]'$ Frequent urination   '[]'$ Blood in urine Skin:  '[]'$ Rashes   '[]'$ Ulcers  Psychological:  '[]'$ History of anxiety   '[]'$  History of major depression.  Physical Examination  Vitals:   12/04/21 0530 12/04/21 0600 12/04/21 0630 12/04/21 0800  BP:    (!) 144/83  Pulse: 71 66 65 71  Resp: (!) '21 20 19 19  '$ Temp:      TempSrc:      SpO2: 100% 100% 100% 100%  Weight:       Body mass index is 15.71 kg/m. Gen: WD/WN, NAD Head: [place]/AT, No temporalis wasting.  Ear/Nose/Throat: Hearing grossly intact, nares w/o erythema or drainage Eyes: PER,  EOMI, sclera nonicteric.  Neck: Supple, no masses.  No bruit or JVD.  Pulmonary:  Good air movement, no audible wheezing, no use of accessory muscles.  Cardiac: RRR, normal S1, S2, no Murmurs. Vascular:  mild trophic changes, no open wounds; left foot is cool to the touch with very sluggish capillary  refill motor or sensory is intact Vessel Right Left  Radial Palpable Palpable  PT Not Palpable Not Palpable  DP Not Palpable Not Palpable  Gastrointestinal: soft, non-distended. No guarding/no peritoneal signs.  Musculoskeletal: M/S 5/5 throughout.  No visible deformity.  Neurologic: CN 2-12 intact. Pain and light touch intact in extremities.  Symmetrical.  Speech is fluent. Motor exam as listed above. Psychiatric: Judgment intact, Mood & affect appropriate for pt's clinical situation. Dermatologic: No rashes or ulcers noted.  No changes consistent with cellulitis.   CBC Lab Results  Component Value Date   WBC 13.7 (H) 12/04/2021   HGB 11.9 (L) 12/04/2021   HCT 37.4 12/04/2021   MCV 92.6 12/04/2021   PLT 118 (L) 12/04/2021    BMET    Component Value Date/Time   NA 143 12/04/2021 0357   NA 142 05/03/2014 1036   NA 141 01/17/2014 1551   K 4.5 12/04/2021 0357   K 3.6 01/17/2014 1551   CL 104 12/04/2021 0357   CL 107 01/17/2014 1551   CO2 30 12/04/2021 0357   CO2 27 01/17/2014 1551   GLUCOSE 109 (H) 12/04/2021 0357   GLUCOSE 86 01/17/2014 1551   BUN 18 12/04/2021 0357   BUN 6 (L) 05/03/2014 1036   BUN 6 (L) 01/17/2014 1551   CREATININE 1.01 (H) 12/04/2021 0357   CREATININE 0.94 04/03/2014 1336   CALCIUM 8.8 (L) 12/04/2021 0357   CALCIUM 8.5 01/17/2014 1551   GFRNONAA 58 (L) 12/04/2021 0357   GFRNONAA >60 04/03/2014 1336   GFRNONAA >60 01/17/2014 1551   GFRAA >60 02/15/2019 0735   GFRAA >60 04/03/2014 1336   GFRAA >60 01/17/2014 1551   Estimated Creatinine Clearance: 31 mL/min (A) (by C-G formula based on SCr of 1.01 mg/dL (H)).  COAG Lab Results  Component Value Date    INR 1.3 (H) 12/04/2021   INR 1.2 12/03/2021   INR 1.0 05/03/2014    Radiology US Venous Img Lower Unilateral Right (DVT)  Result Date: 12/04/2021 CLINICAL DATA:  Right leg swelling EXAM: RIGHT LOWER EXTREMITY VENOUS DOPPLER ULTRASOUND TECHNIQUE: Gray-scale sonography with compression, as well as color and duplex ultrasound, were performed to evaluate the deep venous system(s) from the level of the common femoral vein through the popliteal and proximal calf veins. COMPARISON:  None Available. FINDINGS: VENOUS Normal compressibility of the common femoral, superficial femoral, and popliteal veins, as well as the visualized calf veins. Visualized portions of profunda femoral vein and great saphenous vein unremarkable. No filling defects to suggest DVT on grayscale or color Doppler imaging. Doppler waveforms show normal direction of venous flow, normal respiratory plasticity and response to augmentation. Limited views of the contralateral common femoral vein are unremarkable. OTHER Pulsatile venous waveforms in upper legs, presumably from right heart failure based on recent CTA. Limitations: none IMPRESSION: 1. Negative for DVT in the right lower extremity. 2. Pulsatile venous waveforms, likely transmitted from the enlarged right heart. Electronically Signed   By: Jorje Guild M.D.   On: 12/04/2021 05:16   CT Angio Chest Pulmonary Embolism (PE) W or WO Contrast  Result Date: 12/03/2021 CLINICAL DATA:  Chest pain and hypoxia EXAM: CT ANGIOGRAPHY CHEST WITH CONTRAST TECHNIQUE: Multidetector CT imaging of the chest was performed using the standard protocol during bolus administration of intravenous contrast. Multiplanar CT image reconstructions and MIPs were obtained to evaluate the vascular anatomy. RADIATION DOSE REDUCTION: This exam was performed according to the departmental dose-optimization program which includes automated exposure control, adjustment of the mA and/or  kV according to patient size  and/or use of iterative reconstruction technique. CONTRAST:  61m OMNIPAQUE IOHEXOL 350 MG/ML SOLN COMPARISON:  Chest x-ray from earlier in the same day. FINDINGS: Cardiovascular: Atherosclerotic calcifications of the thoracic aorta are noted. No aneurysmal dilatation is seen. Mild cardiomegaly is noted. Pacing device is seen. Coronary calcifications are noted. The pulmonary artery shows a normal branching pattern bilaterally. No filling defect to suggest pulmonary embolism is noted. Mediastinum/Nodes: Thoracic inlet is within normal limits. No hilar or mediastinal adenopathy is noted. The esophagus is within normal limits. Lungs/Pleura: Small left-sided pleural effusion is noted. No focal infiltrate or pneumothorax is seen. Some areas of mucous plugging within the bronchi are noted bilaterally. This is most marked in the right lower lobe consistent with a degree of bronchitis. Upper Abdomen: Visualized upper abdomen shows no acute abnormality. Stable cholelithiasis is seen. Musculoskeletal: Degenerative changes of the thoracic spine are seen. No rib abnormality is noted. Review of the MIP images confirms the above findings. IMPRESSION: No evidence of pulmonary emboli. Changes suggestive of bronchitis with small left pleural effusion. Aortic Atherosclerosis (ICD10-I70.0). Electronically Signed   By: MInez CatalinaM.D.   On: 12/03/2021 23:25   DG Chest Portable 1 View  Result Date: 12/03/2021 CLINICAL DATA:  Shortness of breath and peripheral swelling EXAM: PORTABLE CHEST 1 VIEW COMPARISON:  09/28/2021 FINDINGS: Cardiac shadow is enlarged but stable. Defibrillator is again noted and stable. Right chest wall port is noted in satisfactory position. Lungs are well aerated bilaterally. No focal infiltrate is seen. Mild vascular congestion is noted. IMPRESSION: Changes of mild CHF. Electronically Signed   By: MInez CatalinaM.D.   On: 12/03/2021 20:21   [place]Venous Img Lower Unilateral Left  Result Date:  12/03/2021 CLINICAL DATA:  Swelling EXAM: Left LOWER EXTREMITY VENOUS DOPPLER ULTRASOUND TECHNIQUE: Gray-scale sonography with compression, as well as color and duplex ultrasound, were performed to evaluate the deep venous system(s) from the level of the common femoral vein through the popliteal and proximal calf veins. COMPARISON:  None Available. FINDINGS: VENOUS Normal compressibility of the common femoral, superficial femoral, and popliteal veins, as well as the visualized calf veins. Visualized portions of profunda femoral vein and great saphenous vein unremarkable. No filling defects to suggest DVT on grayscale or color Doppler imaging. Doppler waveforms show normal direction of venous flow, normal respiratory plasticity and response to augmentation. Limited views of the contralateral common femoral vein are unremarkable. OTHER None. Limitations: none IMPRESSION: Negative. Electronically Signed   By: PValetta MoleM.D.   On: 12/03/2021 18:12   CT Head Wo Contrast  Result Date: 11/08/2021 CLINICAL DATA:  Delirium EXAM: CT HEAD WITHOUT CONTRAST TECHNIQUE: Contiguous axial images were obtained from the base of the skull through the vertex without intravenous contrast. RADIATION DOSE REDUCTION: This exam was performed according to the departmental dose-optimization program which includes automated exposure control, adjustment of the mA and/or kV according to patient size and/or use of iterative reconstruction technique. COMPARISON:  09/28/2021 FINDINGS: Brain: There is atrophy and chronic small vessel disease changes. No acute intracranial abnormality. Specifically, no hemorrhage, hydrocephalus, mass lesion, acute infarction, or significant intracranial injury. Vascular: No hyperdense vessel or unexpected calcification. Skull: No acute calvarial abnormality. Sinuses/Orbits: No acute findings Other: None IMPRESSION: Atrophy, chronic microvascular disease. No acute intracranial abnormality. Electronically Signed    By: KRolm BaptiseM.D.   On: 11/08/2021 21:07     Assessment/Plan 1. Atherosclerosis of native artery of both lower extremities with rest pain ([facility] Recommend:  The patient has evidence of severe atherosclerotic changes of both lower extremities with rest pain that is associated with preulcerative changes and impending tissue loss of the left foot.  This represents a limb threatening ischemia and places the patient at the risk for left limb loss.  Patient should undergo angiography of the left lower extremity with the hope for intervention for limb salvage.  The risks and benefits as well as the alternative therapies was discussed in detail with the patient.  All questions were answered.  Patient agrees to proceed with left lower extremity angiography.  The patient will follow up with me in the office after the procedure so that we may address her aneurysmal disease and continue treatment but today given the acuteness of her lower extremity ischemia improved perfusion of the left foot is the goal.        2. AAA (abdominal aortic aneurysm) without rupture ([place]) Recommend:  The aneurysm is associated with ASO and rest pain and therefore should undergo repair. Patient is status post CT scan of the abdominal aorta. The patient is a candidate for endovascular repair.    The patient will continue antiplatelet therapy as prescribed (since the patient is undergoing endovascular repair as opposed to open repair) as well as aggressive management of hyperlipidemia. Exercise is again strongly encouraged.    3. Renal artery stenosis ([facility]) Recommend:   The patient has evidence of atherosclerotic changes of the renal artery s/p right renal artery stent.  At this time the patient's blood pressure is fairly well controlled.   Patient does not need further angiography of the renal artery given the good control of her hypertension.     However, if at any point the patient's BP becomes acutely worse then  intervention would be strongly encouraged.   The patient voices understanding of this plan and agrees.      4. Atherosclerosis of native coronary artery of native heart without angina pectoris Continue cardiac and antihypertensive medications as already ordered and reviewed, no changes at this time.   Continue statin as ordered and reviewed, no changes at this time   Nitrates PRN for chest pain     5. Essential hypertension Continue antihypertensive medications as already ordered, these medications have been reviewed and there are no changes at this time.     6. Gastroesophageal reflux disease without esophagitis Continue PPI as already ordered, this medication has been reviewed and there are no changes at this time.   Avoidence of caffeine and alcohol   Moderate elevation of the head of the bed    Hortencia Pilar, MD  12/04/2021 10:23 AM

## 2021-12-04 NOTE — H&P (View-Only) (Signed)
$'@LOGO'd$ @   MRN : [id number]  Christine [name] is a 76 y.o. (06/04/1946) female who presents with chief complaint of check circulation.  History of Present Illness:  I am asked to evaluate the patient by Dr. Vladimir Crofts.  Patient is a 76 year old woman who presented to [facility] yesterday evening with a complaint of increasing pain in her foot.  On evaluation in the ER she was noted out of cold pale foot with poor capillary refill.  Doppler signals were obtainable and they were very faint.  Motor and sensory were still intact.  Patient is followed by my service in the past and has documented atherosclerotic occlusive disease of the lower extremities.  The patient is increasingly confused today but last night reported that there has been a significant deterioration in the lower extremity symptoms.  The patient notes interval shortening of their claudication distance and development of mild rest pain symptoms. No new ulcers or wounds have occurred since the last visit.  There have been no significant changes to the patient's overall health care.  The patient denies amaurosis fugax or recent TIA symptoms. There are no recent neurological changes noted. There is no history of DVT, PE or superficial thrombophlebitis. The patient denies recent episodes of angina or shortness of breath.    Current Meds  Medication Sig   aspirin EC 81 MG tablet Take 81 mg by mouth daily.   bisacodyl (DULCOLAX) 5 MG EC tablet Take 5 mg by mouth daily as needed for mild constipation or moderate constipation.   carvedilol (COREG) 25 MG tablet TAKE 1 TABLET BY MOUTH TWICE A DAY (Patient taking differently: Take 25 mg by mouth 2 (two) times daily with a meal.)   clopidogrel (PLAVIX) 75 MG tablet TAKE 1 TABLET BY MOUTH ONCE DAILY (Patient taking differently: Take 75 mg by mouth daily.)   hydrOXYzine (ATARAX) 25 MG tablet Take 25 mg by mouth 3 (three) times daily.   LORazepam (ATIVAN)  0.5 MG tablet Take 0.25 mg by mouth 2 (two) times daily.    pantoprazole (PROTONIX) 40 MG tablet Take 40 mg by mouth daily.    Polyethylene Glycol 3350 (PEG 3350) POWD Take 17 g by mouth daily as needed (constipation.).    rosuvastatin (CRESTOR) 10 MG tablet Take 10 mg by mouth daily.    tamoxifen (NOLVADEX) 20 MG tablet Take 1 tablet (20 mg total) by mouth daily.   traMADol (ULTRAM) 50 MG tablet Take 25 mg by mouth 2 (two) times daily.   valsartan (DIOVAN) 320 MG tablet Take 320 mg by mouth daily at 12 noon.     Past Medical History:  Diagnosis Date   Allergic rhinitis    Anxiety    Breast cancer ([place])    11/26/20 Pt reports wound/lesion on left breast   Chronic systolic heart failure ([place])    Coronary artery disease    GERD (gastroesophageal reflux disease)    Heart murmur    Hyperlipidemia    Hypertension    Implantable defibrillator St Jude    DOI 2006   MI (myocardial infarction) [facility])    Osteoarthritis    Wears dentures    full upper and lower    Past Surgical History:  Procedure Laterality Date   CHF exacerbation  10/07   CORONARY STENT PLACEMENT  8/06   3 stents; myocardial infarction   LOWER EXTREMITY ANGIOGRAPHY Left 02/15/2019   Procedure: LOWER EXTREMITY  ANGIOGRAPHY;  Surgeon: Katha Cabal, MD;  Location: [facility];  Service: Cardiovascular;  Laterality: Left;   NM MYOVIEW LTD  1/08   EF 39%, no sx ischemia   PACEMAKER PLACEMENT  2/07   Defibrillator   RENAL ARTERY STENT Left     Social History Social History   Tobacco Use   Smoking status: Every Day    Packs/day: 0.50    Years: 12.00    Total pack years: 6.00    Types: Cigarettes   Smokeless tobacco: Never   Tobacco comments:       Vaping Use   Vaping Use: Never used  Substance Use Topics   Alcohol use: No    Comment: 2-3 times per month   Drug use: No    Family History Family History  Problem Relation Age of Onset   Stroke Mother 25   Heart attack Father    Coronary  artery disease Father    Heart attack Brother    Coronary artery disease Other    Hypertension Other    Cancer Neg Hx        No breast or colon cancer    Allergies  Allergen Reactions   Atorvastatin Other (See Comments)    increased LFT's   Lisinopril Cough   Oxycodone Hcl Itching and Other (See Comments)    Restless & jittery   Simvastatin Other (See Comments)    myalgias     REVIEW OF SYSTEMS (Negative unless checked)  Constitutional: '[]'$ Weight loss  '[]'$ Fever  '[]'$ Chills Cardiac: '[]'$ Chest pain   '[]'$ Chest pressure   '[]'$ Palpitations   '[]'$ Shortness of breath when laying flat   '[]'$ Shortness of breath with exertion. Vascular:  '[x]'$ Pain in legs with walking   '[]'$ Pain in legs at rest  '[]'$ History of DVT   '[]'$ Phlebitis   '[]'$ Swelling in legs   '[]'$ Varicose veins   '[]'$ Non-healing ulcers Pulmonary:   '[]'$ Uses home oxygen   '[]'$ Productive cough   '[]'$ Hemoptysis   '[]'$ Wheeze  '[]'$ COPD   '[]'$ Asthma Neurologic:  '[]'$ Dizziness   '[]'$ Seizures   '[]'$ History of stroke   '[]'$ History of TIA  '[]'$ Aphasia   '[]'$ Vissual changes   '[]'$ Weakness or numbness in arm   '[]'$ Weakness or numbness in leg Musculoskeletal:   '[]'$ Joint swelling   '[]'$ Joint pain   '[]'$ Low back pain Hematologic:  '[]'$ Easy bruising  '[]'$ Easy bleeding   '[]'$ Hypercoagulable state   '[]'$ Anemic Gastrointestinal:  '[]'$ Diarrhea   '[]'$ Vomiting  '[x]'$ Gastroesophageal reflux/heartburn   '[]'$ Difficulty swallowing. Genitourinary:  '[]'$ Chronic kidney disease   '[]'$ Difficult urination  '[]'$ Frequent urination   '[]'$ Blood in urine Skin:  '[]'$ Rashes   '[]'$ Ulcers  Psychological:  '[]'$ History of anxiety   '[]'$  History of major depression.  Physical Examination  Vitals:   12/04/21 0530 12/04/21 0600 12/04/21 0630 12/04/21 0800  BP:    (!) 144/83  Pulse: 71 66 65 71  Resp: (!) '21 20 19 19  '$ Temp:      TempSrc:      SpO2: 100% 100% 100% 100%  Weight:       Body mass index is 15.71 kg/m. Gen: WD/WN, NAD Head: [place]/AT, No temporalis wasting.  Ear/Nose/Throat: Hearing grossly intact, nares w/o erythema or drainage Eyes: PER,  EOMI, sclera nonicteric.  Neck: Supple, no masses.  No bruit or JVD.  Pulmonary:  Good air movement, no audible wheezing, no use of accessory muscles.  Cardiac: RRR, normal S1, S2, no Murmurs. Vascular:  mild trophic changes, no open wounds; left foot is cool to the touch with very sluggish capillary  refill motor or sensory is intact Vessel Right Left  Radial Palpable Palpable  PT Not Palpable Not Palpable  DP Not Palpable Not Palpable  Gastrointestinal: soft, non-distended. No guarding/no peritoneal signs.  Musculoskeletal: M/S 5/5 throughout.  No visible deformity.  Neurologic: CN 2-12 intact. Pain and light touch intact in extremities.  Symmetrical.  Speech is fluent. Motor exam as listed above. Psychiatric: Judgment intact, Mood & affect appropriate for pt's clinical situation. Dermatologic: No rashes or ulcers noted.  No changes consistent with cellulitis.   CBC Lab Results  Component Value Date   WBC 13.7 (H) 12/04/2021   HGB 11.9 (L) 12/04/2021   HCT 37.4 12/04/2021   MCV 92.6 12/04/2021   PLT 118 (L) 12/04/2021    BMET    Component Value Date/Time   NA 143 12/04/2021 0357   NA 142 05/03/2014 1036   NA 141 01/17/2014 1551   K 4.5 12/04/2021 0357   K 3.6 01/17/2014 1551   CL 104 12/04/2021 0357   CL 107 01/17/2014 1551   CO2 30 12/04/2021 0357   CO2 27 01/17/2014 1551   GLUCOSE 109 (H) 12/04/2021 0357   GLUCOSE 86 01/17/2014 1551   BUN 18 12/04/2021 0357   BUN 6 (L) 05/03/2014 1036   BUN 6 (L) 01/17/2014 1551   CREATININE 1.01 (H) 12/04/2021 0357   CREATININE 0.94 04/03/2014 1336   CALCIUM 8.8 (L) 12/04/2021 0357   CALCIUM 8.5 01/17/2014 1551   GFRNONAA 58 (L) 12/04/2021 0357   GFRNONAA >60 04/03/2014 1336   GFRNONAA >60 01/17/2014 1551   GFRAA >60 02/15/2019 0735   GFRAA >60 04/03/2014 1336   GFRAA >60 01/17/2014 1551   Estimated Creatinine Clearance: 31 mL/min (A) (by C-G formula based on SCr of 1.01 mg/dL (H)).  COAG Lab Results  Component Value Date    INR 1.3 (H) 12/04/2021   INR 1.2 12/03/2021   INR 1.0 05/03/2014    Radiology US Venous Img Lower Unilateral Right (DVT)  Result Date: 12/04/2021 CLINICAL DATA:  Right leg swelling EXAM: RIGHT LOWER EXTREMITY VENOUS DOPPLER ULTRASOUND TECHNIQUE: Gray-scale sonography with compression, as well as color and duplex ultrasound, were performed to evaluate the deep venous system(s) from the level of the common femoral vein through the popliteal and proximal calf veins. COMPARISON:  None Available. FINDINGS: VENOUS Normal compressibility of the common femoral, superficial femoral, and popliteal veins, as well as the visualized calf veins. Visualized portions of profunda femoral vein and great saphenous vein unremarkable. No filling defects to suggest DVT on grayscale or color Doppler imaging. Doppler waveforms show normal direction of venous flow, normal respiratory plasticity and response to augmentation. Limited views of the contralateral common femoral vein are unremarkable. OTHER Pulsatile venous waveforms in upper legs, presumably from right heart failure based on recent CTA. Limitations: none IMPRESSION: 1. Negative for DVT in the right lower extremity. 2. Pulsatile venous waveforms, likely transmitted from the enlarged right heart. Electronically Signed   By: Jorje Guild M.D.   On: 12/04/2021 05:16   CT Angio Chest Pulmonary Embolism (PE) W or WO Contrast  Result Date: 12/03/2021 CLINICAL DATA:  Chest pain and hypoxia EXAM: CT ANGIOGRAPHY CHEST WITH CONTRAST TECHNIQUE: Multidetector CT imaging of the chest was performed using the standard protocol during bolus administration of intravenous contrast. Multiplanar CT image reconstructions and MIPs were obtained to evaluate the vascular anatomy. RADIATION DOSE REDUCTION: This exam was performed according to the departmental dose-optimization program which includes automated exposure control, adjustment of the mA and/or  kV according to patient size  and/or use of iterative reconstruction technique. CONTRAST:  4m OMNIPAQUE IOHEXOL 350 MG/ML SOLN COMPARISON:  Chest x-ray from earlier in the same day. FINDINGS: Cardiovascular: Atherosclerotic calcifications of the thoracic aorta are noted. No aneurysmal dilatation is seen. Mild cardiomegaly is noted. Pacing device is seen. Coronary calcifications are noted. The pulmonary artery shows a normal branching pattern bilaterally. No filling defect to suggest pulmonary embolism is noted. Mediastinum/Nodes: Thoracic inlet is within normal limits. No hilar or mediastinal adenopathy is noted. The esophagus is within normal limits. Lungs/Pleura: Small left-sided pleural effusion is noted. No focal infiltrate or pneumothorax is seen. Some areas of mucous plugging within the bronchi are noted bilaterally. This is most marked in the right lower lobe consistent with a degree of bronchitis. Upper Abdomen: Visualized upper abdomen shows no acute abnormality. Stable cholelithiasis is seen. Musculoskeletal: Degenerative changes of the thoracic spine are seen. No rib abnormality is noted. Review of the MIP images confirms the above findings. IMPRESSION: No evidence of pulmonary emboli. Changes suggestive of bronchitis with small left pleural effusion. Aortic Atherosclerosis (ICD10-I70.0). Electronically Signed   By: MInez CatalinaM.D.   On: 12/03/2021 23:25   DG Chest Portable 1 View  Result Date: 12/03/2021 CLINICAL DATA:  Shortness of breath and peripheral swelling EXAM: PORTABLE CHEST 1 VIEW COMPARISON:  09/28/2021 FINDINGS: Cardiac shadow is enlarged but stable. Defibrillator is again noted and stable. Right chest wall port is noted in satisfactory position. Lungs are well aerated bilaterally. No focal infiltrate is seen. Mild vascular congestion is noted. IMPRESSION: Changes of mild CHF. Electronically Signed   By: MInez CatalinaM.D.   On: 12/03/2021 20:21   UK[place]Venous Img Lower Unilateral Left  Result Date:  12/03/2021 CLINICAL DATA:  Swelling EXAM: Left LOWER EXTREMITY VENOUS DOPPLER ULTRASOUND TECHNIQUE: Gray-scale sonography with compression, as well as color and duplex ultrasound, were performed to evaluate the deep venous system(s) from the level of the common femoral vein through the popliteal and proximal calf veins. COMPARISON:  None Available. FINDINGS: VENOUS Normal compressibility of the common femoral, superficial femoral, and popliteal veins, as well as the visualized calf veins. Visualized portions of profunda femoral vein and great saphenous vein unremarkable. No filling defects to suggest DVT on grayscale or color Doppler imaging. Doppler waveforms show normal direction of venous flow, normal respiratory plasticity and response to augmentation. Limited views of the contralateral common femoral vein are unremarkable. OTHER None. Limitations: none IMPRESSION: Negative. Electronically Signed   By: PValetta MoleM.D.   On: 12/03/2021 18:12   CT Head Wo Contrast  Result Date: 11/08/2021 CLINICAL DATA:  Delirium EXAM: CT HEAD WITHOUT CONTRAST TECHNIQUE: Contiguous axial images were obtained from the base of the skull through the vertex without intravenous contrast. RADIATION DOSE REDUCTION: This exam was performed according to the departmental dose-optimization program which includes automated exposure control, adjustment of the mA and/or kV according to patient size and/or use of iterative reconstruction technique. COMPARISON:  09/28/2021 FINDINGS: Brain: There is atrophy and chronic small vessel disease changes. No acute intracranial abnormality. Specifically, no hemorrhage, hydrocephalus, mass lesion, acute infarction, or significant intracranial injury. Vascular: No hyperdense vessel or unexpected calcification. Skull: No acute calvarial abnormality. Sinuses/Orbits: No acute findings Other: None IMPRESSION: Atrophy, chronic microvascular disease. No acute intracranial abnormality. Electronically Signed    By: KRolm BaptiseM.D.   On: 11/08/2021 21:07     Assessment/Plan 1. Atherosclerosis of native artery of both lower extremities with rest pain ([place] Recommend:  The patient has evidence of severe atherosclerotic changes of both lower extremities with rest pain that is associated with preulcerative changes and impending tissue loss of the left foot.  This represents a limb threatening ischemia and places the patient at the risk for left limb loss.  Patient should undergo angiography of the left lower extremity with the hope for intervention for limb salvage.  The risks and benefits as well as the alternative therapies was discussed in detail with the patient.  All questions were answered.  Patient agrees to proceed with left lower extremity angiography.  The patient will follow up with me in the office after the procedure so that we may address her aneurysmal disease and continue treatment but today given the acuteness of her lower extremity ischemia improved perfusion of the left foot is the goal.        2. AAA (abdominal aortic aneurysm) without rupture ([place]) Recommend:  The aneurysm is associated with ASO and rest pain and therefore should undergo repair. Patient is status post CT scan of the abdominal aorta. The patient is a candidate for endovascular repair.    The patient will continue antiplatelet therapy as prescribed (since the patient is undergoing endovascular repair as opposed to open repair) as well as aggressive management of hyperlipidemia. Exercise is again strongly encouraged.    3. Renal artery stenosis (HCC) Recommend:   The patient has evidence of atherosclerotic changes of the renal artery s/p right renal artery stent.  At this time the patient's blood pressure is fairly well controlled.   Patient does not need further angiography of the renal artery given the good control of her hypertension.     However, if at any point the patient's BP becomes acutely worse then  intervention would be strongly encouraged.   The patient voices understanding of this plan and agrees.      4. Atherosclerosis of native coronary artery of native heart without angina pectoris Continue cardiac and antihypertensive medications as already ordered and reviewed, no changes at this time.   Continue statin as ordered and reviewed, no changes at this time   Nitrates PRN for chest pain     5. Essential hypertension Continue antihypertensive medications as already ordered, these medications have been reviewed and there are no changes at this time.     6. Gastroesophageal reflux disease without esophagitis Continue PPI as already ordered, this medication has been reviewed and there are no changes at this time.   Avoidence of caffeine and alcohol   Moderate elevation of the head of the bed    Hortencia Pilar, MD  12/04/2021 10:23 AM

## 2021-12-04 NOTE — Consult Note (Signed)
ANTICOAGULATION CONSULT NOTE   Pharmacy Consult for Heparin Indication:  limb ischemia  Allergies  Allergen Reactions   Atorvastatin Other (See Comments)    increased LFT's   Lisinopril Cough   Oxycodone Hcl Itching and Other (See Comments)    Restless & jittery   Simvastatin Other (See Comments)    myalgias    Patient Measurements: Weight: 40.2 kg (88 lb 11.2 oz) Heparin Dosing Weight: 40.2 kg  Vital Signs: BP: 152/99 (06/21 0330) Pulse Rate: 70 (06/21 0330)  Labs: Recent Labs    12/03/21 1721 12/03/21 2005 12/03/21 2143 12/04/21 0137 12/04/21 0357 12/04/21 0530  HGB 12.1  --   --   --   --  11.9*  HCT 37.9  --   --   --   --  37.4  PLT 118*  --   --   --   --  118*  APTT  --  31  --   --   --   --   LABPROT  --  14.8  --   --   --  16.3*  INR  --  1.2  --   --   --  1.3*  HEPARINUNFRC  --   --   --   --   --  >1.10*  CREATININE 1.01*  --   --  0.88 1.01*  --   TROPONINIHS 24* 28* 52* 128* 154*  --      Estimated Creatinine Clearance: 31 mL/min (A) (by C-G formula based on SCr of 1.01 mg/dL (H)).   Medical History: Past Medical History:  Diagnosis Date   Allergic rhinitis    Anxiety    Breast cancer ([place])    11/26/20 Pt reports wound/lesion on left breast   Chronic systolic heart failure (HCC)    Coronary artery disease    GERD (gastroesophageal reflux disease)    Heart murmur    Hyperlipidemia    Hypertension    Implantable defibrillator St Jude    DOI 2006   MI (myocardial infarction) ([place])    Osteoarthritis    Wears dentures    full upper and lower    Medications:  (Not in a hospital admission) Scheduled:   heparin  2,000 Units Intravenous Once   Infusions:    ceFAZolin (ANCEF) IV     heparin 500 Units/hr (12/03/21 2045)   PRN:  Anti-infectives (From admission, onward)    Start     Dose/Rate Route Frequency Ordered Stop   12/04/21 0600  ceFAZolin (ANCEF) IVPB 2g/100 mL premix        2 g 200 mL/hr over 30 Minutes Intravenous On  call to O.R. 12/03/21 2133 12/05/21 0559       Assessment: Pharmacy consulted to start heparin for an limb ischemia. No note of DOAC PTA.   Goal of Therapy:  Heparin level 0.3-0.7 units/ml Monitor platelets by anticoagulation protocol: Yes   Plan:  6/21:  HL @ 0530 = >1.10  Spoke with RN who said having issues with pt IV; IV team had to place a new line and all labs were drawn from line so in theory the RN feels it should have been a clean draw.   Nonetheless, will repeat HL to confirm result is valid.   New HL to be drawn at 0610.   Shaylyn Bawa D, PharmD 12/04/2021,6:08 AM

## 2021-12-04 NOTE — Assessment & Plan Note (Signed)
Stable, not currently in exacerbation

## 2021-12-04 NOTE — Progress Notes (Signed)
PROGRESS NOTE    Christine [name]  ZJQ:[id number] DOB: [birth year]  DOA: 12/03/2021 Date of Service: 12/04/21 PCP: Baxter Hire, MD     Brief Narrative / Hospital Course:  Christine [name] is a 76 y.o. female with medical history significant of   anxiety, breast cancer, chronic systolic CHF ef 24%, coronary artery disease s/p PCI and stent and AICD placement, GERD, hypertension, dyslipidemia,osteoarthritis,  PVD, who presents to the emergency room 12/03/2021 brought in by son with complaint of progressive left lower extremity swelling and pain.  Reporting typically she has pain on exertion and improved with rest, however has not improved with rest recently. 06/20: ED physician noted bilateral LE edema, difficulty appreciating DP bilateral pulse, prolonged cap refill, bruising to left leg, left leg cooler to the touch.  Lower extremity ultrasound no DVT.  Concerning for lower extremity ischemia.  Started on heparin in the ED. added CXR, troponins, BNP to evaluate for potential CHF considering history of reduced EF and acute hypoxemia requiring 4 L O2 via [place] in ED.  Also received Lasix in the ED, CXR questionable mild congestion.  Continued home regimen for CHF, plan to repeat Lasix as needed. D-dimer positive, CTPE neg. troponin negative/flat.  Vascular surgery consulted.  06/21: vascular planning for angiogram today. Echo pending to eval CHF. TSH elevated,   Consultants:  Vascular Surgery - Dr. [name]  Procedures: (Planned angiography 12/04/21)     Subjective: Patient reports feeling well, tired, no significant pain other than her usual back pain.      ASSESSMENT & PLAN:   Principal Problem:   Critical limb ischemia of left lower extremity (HCC) Active Problems:   Chronic obstructive pulmonary disease (HCC)   Coronary artery disease without angina pectoris   HFrEF (heart failure with reduced ejection fraction) (HCC)   Critical limb ischemia of left lower extremity (HCC) hx of  PVD with ABI "severe left lower extremity arterial disease" 01/2019 progressive care  Heparin vascular consult Dr Delana Meyer aware , plan intervention today 12/04/21  supportive care for pain  HFrEF (heart failure with reduced ejection fraction) ([place]) Uncertain if baseline or mild exacerbation BNP elevated, LE edema, CXR changes concerning for exacerbation Echo 09/28/2021 EF 30-35 S/p AICD Echo pending Lasix IV for now  I&O  Coronary artery disease without angina pectoris Home Rx continued: Carvedilol 25 mg twice daily, irbesartan 300 mg daily substituted for home valsartan, rosuvastatin 10 mg daily  Chronic obstructive pulmonary disease ([place]) Stable, not currently in exacerbation     DVT prophylaxis: heparin Code Status: FULL Family Communication: son at bedside Disposition Plan / TOC needs: may need HH/SNF Barriers to discharge / significant pending items: pending vascular procedure and subsequent recovery              Objective: Vitals:   12/04/21 0600 12/04/21 0630 12/04/21 0800 12/04/21 1210  BP:   (!) 144/83 (!) 118/91  Pulse: 66 65 71 74  Resp: '20 19 19 '$ (!) 21  Temp:    98.4 F (36.9 C)  TempSrc:      SpO2: 100% 100% 100% 98%  Weight:        Intake/Output Summary (Last 24 hours) at 12/04/2021 1355 Last data filed at 12/04/2021 0734 Gross per 24 hour  Intake 145.53 ml  Output 900 ml  Net -754.47 ml   Filed Weights   12/03/21 2008  Weight: 40.2 kg    Examination:  Constitutional:  VS as above General Appearance: alert, thin, NAD Ears, Nose, Mouth,  Throat: Normal appearance Neck: No masses, trachea midline Respiratory: Normal respiratory effort Breath sounds normal, no wheeze/rhonchi/rales Cardiovascular: S1/S2 normal, RRR (+) lower extremity edema bilaterally Gastrointestinal: Nontender, no masses Musculoskeletal:  Gait not examined No clubbing/cyanosis of digits Skin:  Bruising on LLE Cool to touch LLE Bandage in place L upper  chest (hx cancer)  Neurological: No cranial nerve deficit on limited exam Motor and intact and symmetric Psychiatric: Normal judgment/insight Normal mood and affect       Scheduled Medications:   carvedilol  25 mg Oral BID   heparin  2,000 Units Intravenous Once   irbesartan  300 mg Oral Daily   pantoprazole  40 mg Oral Daily   rosuvastatin  10 mg Oral Daily   tamoxifen  20 mg Oral Daily   traMADol  25 mg Oral BID    Continuous Infusions:   ceFAZolin (ANCEF) IV     heparin 650 Units/hr (12/04/21 0734)    PRN Medications:  albuterol, bisacodyl, labetalol, LORazepam, morphine injection, nitroGLYCERIN, ondansetron **OR** ondansetron (ZOFRAN) IV, polyethylene glycol  Antimicrobials:  Anti-infectives (From admission, onward)    Start     Dose/Rate Route Frequency Ordered Stop   12/04/21 1445  ceFAZolin (ANCEF) IVPB 2g/100 mL premix        2 g 200 mL/hr over 30 Minutes Intravenous  Once 12/04/21 1351     12/04/21 0600  ceFAZolin (ANCEF) IVPB 2g/100 mL premix        2 g 200 mL/hr over 30 Minutes Intravenous On call to O.R. 12/03/21 2133 12/04/21 2878       Data Reviewed: I have personally reviewed following labs and imaging studies  CBC: Recent Labs  Lab 12/03/21 1721 12/04/21 0530  WBC 7.2 13.7*  NEUTROABS 5.1  --   HGB 12.1 11.9*  HCT 37.9 37.4  MCV 93.8 92.6  PLT 118* 676*   Basic Metabolic Panel: Recent Labs  Lab 12/03/21 1721 12/04/21 0137 12/04/21 0357  NA 142 142 143  K 3.7 3.9 4.5  CL 107 105 104  CO2 '26 27 30  '$ GLUCOSE 101* 122* 109*  BUN '14 17 18  '$ CREATININE 1.01* 0.88 1.01*  CALCIUM 8.9 8.7* 8.8*   GFR: Estimated Creatinine Clearance: 31 mL/min (A) (by C-G formula based on SCr of 1.01 mg/dL (H)). Liver Function Tests: Recent Labs  Lab 12/04/21 0357  AST 35  ALT 16  ALKPHOS 54  BILITOT 1.3*  PROT 6.7  ALBUMIN 3.8   No results for input(s): "LIPASE", "AMYLASE" in the last 168 hours. No results for input(s): "AMMONIA" in the last  168 hours. Coagulation Profile: Recent Labs  Lab 12/03/21 2005 12/04/21 0530  INR 1.2 1.3*   Cardiac Enzymes: No results for input(s): "CKTOTAL", "CKMB", "CKMBINDEX", "TROPONINI" in the last 168 hours. BNP (last 3 results) No results for input(s): "PROBNP" in the last 8760 hours. HbA1C: No results for input(s): "HGBA1C" in the last 72 hours. CBG: No results for input(s): "GLUCAP" in the last 168 hours. Lipid Profile: No results for input(s): "CHOL", "HDL", "LDLCALC", "TRIG", "CHOLHDL", "LDLDIRECT" in the last 72 hours. Thyroid Function Tests: Recent Labs    12/03/21 2143 12/04/21 0357  TSH 10.272*  --   FREET4  --  1.19*   Anemia Panel: No results for input(s): "VITAMINB12", "FOLATE", "FERRITIN", "TIBC", "IRON", "RETICCTPCT" in the last 72 hours. Urine analysis:    Component Value Date/Time   COLORURINE COLORLESS (A) 12/03/2021 2143   APPEARANCEUR CLEAR (A) 12/03/2021 2143   LABSPEC 1.012 12/03/2021 2143  PHURINE 7.0 12/03/2021 2143   GLUCOSEU NEGATIVE 12/03/2021 2143   HGBUR SMALL (A) 12/03/2021 2143   BILIRUBINUR NEGATIVE 12/03/2021 2143   KETONESUR NEGATIVE 12/03/2021 2143   PROTEINUR NEGATIVE 12/03/2021 2143   NITRITE NEGATIVE 12/03/2021 2143   LEUKOCYTESUR NEGATIVE 12/03/2021 2143   Sepsis Labs: '@LABRCNTIP'$ (procalcitonin:4,lacticidven:4)  Recent Results (from the past 240 hour(s))  Respiratory (~20 pathogens) panel by PCR     Status: None   Collection Time: 12/04/21  3:56 AM   Specimen: Nasopharyngeal Swab; Respiratory  Result Value Ref Range Status   Adenovirus NOT DETECTED NOT DETECTED Final   Coronavirus 229E NOT DETECTED NOT DETECTED Final    Comment: (NOTE) The Coronavirus on the Respiratory Panel, DOES NOT test for the novel  Coronavirus (2019 nCoV)    Coronavirus HKU1 NOT DETECTED NOT DETECTED Final   Coronavirus NL63 NOT DETECTED NOT DETECTED Final   Coronavirus OC43 NOT DETECTED NOT DETECTED Final   Metapneumovirus NOT DETECTED NOT DETECTED  Final   Rhinovirus / Enterovirus NOT DETECTED NOT DETECTED Final   Influenza A NOT DETECTED NOT DETECTED Final   Influenza B NOT DETECTED NOT DETECTED Final   Parainfluenza Virus 1 NOT DETECTED NOT DETECTED Final   Parainfluenza Virus 2 NOT DETECTED NOT DETECTED Final   Parainfluenza Virus 3 NOT DETECTED NOT DETECTED Final   Parainfluenza Virus 4 NOT DETECTED NOT DETECTED Final   Respiratory Syncytial Virus NOT DETECTED NOT DETECTED Final   Bordetella pertussis NOT DETECTED NOT DETECTED Final   Bordetella Parapertussis NOT DETECTED NOT DETECTED Final   Chlamydophila pneumoniae NOT DETECTED NOT DETECTED Final   Mycoplasma pneumoniae NOT DETECTED NOT DETECTED Final    Comment: Performed at [facility], [place]. [address], [place], [place] [address]         Radiology Studies last 96 hours: ECHOCARDIOGRAM COMPLETE  Result Date: 12/04/2021    ECHOCARDIOGRAM REPORT   Patient Name:   AVALIN BRILEY Date of Exam: 12/04/2021 Medical Rec #:  [id number]     Height:       63.0 in Accession #:    [id number]    Weight:       88.7 lb Date of Birth:  09-24-1945    BSA:          1.369 m Patient Age:    59 years      BP:           153/83 mmHg Patient Gender: F             HR:           45 bpm. Exam Location:  [facility] Procedure: 2D Echo, Color Doppler, Cardiac Doppler and Intracardiac            Opacification Agent Indications:     R06.00 Dyspnea  History:         Patient has prior history of Echocardiogram examinations, most                  recent 09/28/2021. Defibrillator; Risk Factors:Hypertension and                  Dyslipidemia.  Sonographer:     Charmayne Sheer Referring Phys:  [id number] SARA-MAIZ A THOMAS Diagnosing Phys: Kate Sable MD  Sonographer Comments: No parasternal window and Technically challenging study due to limited acoustic windows. Image acquisition challenging due to patient body habitus and pt weighs 88 lbs. IMPRESSIONS  1. Left ventricular ejection fraction, by estimation, is 25 to 30%.  The  left ventricle has severely decreased function. The left ventricle demonstrates regional wall motion abnormalities (see scoring diagram/findings for description). There is mild left  ventricular hypertrophy of the basal-septal segment. Left ventricular diastolic parameters are consistent with Grade I diastolic dysfunction (impaired relaxation). There is akinesis of the left ventricular, entire apical segment.  2. Right ventricular systolic function is normal. The right ventricular size is normal.  3. Left atrial size was severely dilated.  4. A small pericardial effusion is present.  5. The mitral valve is normal in structure. Mild mitral valve regurgitation.  6. The aortic valve was not well visualized. Aortic valve regurgitation is trivial.  7. The inferior vena cava is dilated in size with <50% respiratory variability, suggesting right atrial pressure of 15 mmHg. FINDINGS  Left Ventricle: Left ventricular ejection fraction, by estimation, is 25 to 30%. The left ventricle has severely decreased function. The left ventricle demonstrates regional wall motion abnormalities. Definity contrast agent was given IV to delineate the left ventricular endocardial borders. The left ventricular internal cavity size was normal in size. There is mild left ventricular hypertrophy of the basal-septal segment. Left ventricular diastolic parameters are consistent with Grade I diastolic dysfunction (impaired relaxation). Right Ventricle: The right ventricular size is normal. No increase in right ventricular wall thickness. Right ventricular systolic function is normal. Left Atrium: Left atrial size was severely dilated. Right Atrium: Right atrial size was normal in size. Pericardium: A small pericardial effusion is present. Mitral Valve: The mitral valve is normal in structure. Mild mitral valve regurgitation. Tricuspid Valve: The tricuspid valve is grossly normal. Tricuspid valve regurgitation is not demonstrated. Aortic Valve:  The aortic valve was not well visualized. Aortic valve regurgitation is trivial. Aortic valve mean gradient measures 1.0 mmHg. Aortic valve peak gradient measures 2.5 mmHg. Pulmonic Valve: The pulmonic valve was not well visualized. Pulmonic valve regurgitation is not visualized. Aorta: The aortic root was not well visualized. Venous: The inferior vena cava is dilated in size with less than 50% respiratory variability, suggesting right atrial pressure of 15 mmHg. IAS/Shunts: No atrial level shunt detected by color flow Doppler. Additional Comments: A device lead is visualized.   LV Volumes (MOD) LV vol d, MOD A2C: 68.3 ml Diastology LV vol d, MOD A4C: 75.5 ml LV e' medial:    3.48 cm/s LV vol s, MOD A2C: 57.2 ml LV E/e' medial:  11.4 LV vol s, MOD A4C: 61.4 ml LV e' lateral:   3.59 cm/s LV SV MOD A2C:     11.1 ml LV E/e' lateral: 11.1 LV SV MOD A4C:     75.5 ml LV SV MOD BP:      13.1 ml LEFT ATRIUM           Index LA Vol (A4C): 76.0 ml 55.53 ml/m  AORTIC VALVE AV Vmax:           79.30 cm/s AV Vmean:          48.200 cm/s AV VTI:            0.151 m AV Peak Grad:      2.5 mmHg AV Mean Grad:      1.0 mmHg LVOT Vmax:         48.30 cm/s LVOT Vmean:        29.800 cm/s LVOT VTI:          0.083 m LVOT/AV VTI ratio: 0.55 MITRAL VALVE MV Area (PHT): 4.89 cm    SHUNTS MV Decel Time: 155 msec  Systemic VTI: 0.08 m MV E velocity: 39.80 cm/s MV A velocity: 50.10 cm/s MV E/A ratio:  0.79 Kate Sable MD Electronically signed by Kate Sable MD Signature Date/Time: 12/04/2021/1:51:51 PM    Final    US Venous Img Lower Unilateral Right (DVT)  Result Date: 12/04/2021 CLINICAL DATA:  Right leg swelling EXAM: RIGHT LOWER EXTREMITY VENOUS DOPPLER ULTRASOUND TECHNIQUE: Gray-scale sonography with compression, as well as color and duplex ultrasound, were performed to evaluate the deep venous system(s) from the level of the common femoral vein through the popliteal and proximal calf veins. COMPARISON:  None Available.  FINDINGS: VENOUS Normal compressibility of the common femoral, superficial femoral, and popliteal veins, as well as the visualized calf veins. Visualized portions of profunda femoral vein and great saphenous vein unremarkable. No filling defects to suggest DVT on grayscale or color Doppler imaging. Doppler waveforms show normal direction of venous flow, normal respiratory plasticity and response to augmentation. Limited views of the contralateral common femoral vein are unremarkable. OTHER Pulsatile venous waveforms in upper legs, presumably from right heart failure based on recent CTA. Limitations: none IMPRESSION: 1. Negative for DVT in the right lower extremity. 2. Pulsatile venous waveforms, likely transmitted from the enlarged right heart. Electronically Signed   By: Jorje Guild M.D.   On: 12/04/2021 05:16   CT Angio Chest Pulmonary Embolism (PE) W or WO Contrast  Result Date: 12/03/2021 CLINICAL DATA:  Chest pain and hypoxia EXAM: CT ANGIOGRAPHY CHEST WITH CONTRAST TECHNIQUE: Multidetector CT imaging of the chest was performed using the standard protocol during bolus administration of intravenous contrast. Multiplanar CT image reconstructions and MIPs were obtained to evaluate the vascular anatomy. RADIATION DOSE REDUCTION: This exam was performed according to the departmental dose-optimization program which includes automated exposure control, adjustment of the mA and/or kV according to patient size and/or use of iterative reconstruction technique. CONTRAST:  67m OMNIPAQUE IOHEXOL 350 MG/ML SOLN COMPARISON:  Chest x-ray from earlier in the same day. FINDINGS: Cardiovascular: Atherosclerotic calcifications of the thoracic aorta are noted. No aneurysmal dilatation is seen. Mild cardiomegaly is noted. Pacing device is seen. Coronary calcifications are noted. The pulmonary artery shows a normal branching pattern bilaterally. No filling defect to suggest pulmonary embolism is noted. Mediastinum/Nodes:  Thoracic inlet is within normal limits. No hilar or mediastinal adenopathy is noted. The esophagus is within normal limits. Lungs/Pleura: Small left-sided pleural effusion is noted. No focal infiltrate or pneumothorax is seen. Some areas of mucous plugging within the bronchi are noted bilaterally. This is most marked in the right lower lobe consistent with a degree of bronchitis. Upper Abdomen: Visualized upper abdomen shows no acute abnormality. Stable cholelithiasis is seen. Musculoskeletal: Degenerative changes of the thoracic spine are seen. No rib abnormality is noted. Review of the MIP images confirms the above findings. IMPRESSION: No evidence of pulmonary emboli. Changes suggestive of bronchitis with small left pleural effusion. Aortic Atherosclerosis (ICD10-I70.0). Electronically Signed   By: MInez CatalinaM.D.   On: 12/03/2021 23:25   DG Chest Portable 1 View  Result Date: 12/03/2021 CLINICAL DATA:  Shortness of breath and peripheral swelling EXAM: PORTABLE CHEST 1 VIEW COMPARISON:  09/28/2021 FINDINGS: Cardiac shadow is enlarged but stable. Defibrillator is again noted and stable. Right chest wall port is noted in satisfactory position. Lungs are well aerated bilaterally. No focal infiltrate is seen. Mild vascular congestion is noted. IMPRESSION: Changes of mild CHF. Electronically Signed   By: MInez CatalinaM.D.   On: 12/03/2021 20:21   [place]Venous Img  Lower Unilateral Left  Result Date: 12/03/2021 CLINICAL DATA:  Swelling EXAM: Left LOWER EXTREMITY VENOUS DOPPLER ULTRASOUND TECHNIQUE: Gray-scale sonography with compression, as well as color and duplex ultrasound, were performed to evaluate the deep venous system(s) from the level of the common femoral vein through the popliteal and proximal calf veins. COMPARISON:  None Available. FINDINGS: VENOUS Normal compressibility of the common femoral, superficial femoral, and popliteal veins, as well as the visualized calf veins. Visualized portions of  profunda femoral vein and great saphenous vein unremarkable. No filling defects to suggest DVT on grayscale or color Doppler imaging. Doppler waveforms show normal direction of venous flow, normal respiratory plasticity and response to augmentation. Limited views of the contralateral common femoral vein are unremarkable. OTHER None. Limitations: none IMPRESSION: Negative. Electronically Signed   By: Valetta Mole M.D.   On: 12/03/2021 18:12            LOS: 1 day    Time spent: 35 min    Emeterio Reeve, DO [facility] 12/04/2021, 1:55 PM   Staff may message me via secure chat in [place]  but this may not receive immediate response,  please page for urgent matters!  If 7PM-7AM, please contact night-coverage [email or web address]  Dictation software was used to generate the above note. Typos may occur and escape review, as with typed/written notes. Please contact Dr Sheppard Coil directly for clarity if needed.

## 2021-12-04 NOTE — ED Notes (Addendum)
2nd IV attempted by numerous Rns. Blood draw attempted, having a very difficult time. Pt has large bruises on arms due to pt removing previous Ivs and from draws IV consult has been put in place to attempt to get a 2nd line

## 2021-12-04 NOTE — Interval H&P Note (Signed)
History and Physical Interval Note:  12/04/2021 3:04 PM  Christine [name]  has presented today for surgery, with the diagnosis of atherosclerosis with rest pain.  The various methods of treatment have been discussed with the patient and family. After consideration of risks, benefits and other options for treatment, the patient has consented to  Procedure(s): Lower Extremity Angiography (Left) as a surgical intervention.  The patient's history has been reviewed, patient examined, no change in status, stable for surgery.  I have reviewed the patient's chart and labs.  Questions were answered to the patient's satisfaction.     Hortencia Pilar

## 2021-12-04 NOTE — ED Notes (Signed)
Informed RN bed assigned 

## 2021-12-05 ENCOUNTER — Encounter: Payer: Self-pay | Admitting: Vascular Surgery

## 2021-12-05 DIAGNOSIS — I70222 Atherosclerosis of native arteries of extremities with rest pain, left leg: Secondary | ICD-10-CM | POA: Diagnosis not present

## 2021-12-05 LAB — CBC
HCT: 33.1 % — ABNORMAL LOW (ref 36.0–46.0)
Hemoglobin: 11 g/dL — ABNORMAL LOW (ref 12.0–15.0)
MCH: 29.4 pg (ref 26.0–34.0)
MCHC: 33.2 g/dL (ref 30.0–36.0)
MCV: 88.5 fL (ref 80.0–100.0)
Platelets: 119 10*3/uL — ABNORMAL LOW (ref 150–400)
RBC: 3.74 MIL/uL — ABNORMAL LOW (ref 3.87–5.11)
RDW: 14 % (ref 11.5–15.5)
WBC: 7.5 10*3/uL (ref 4.0–10.5)
nRBC: 0 % (ref 0.0–0.2)

## 2021-12-05 LAB — BASIC METABOLIC PANEL
Anion gap: 6 (ref 5–15)
BUN: 24 mg/dL — ABNORMAL HIGH (ref 8–23)
CO2: 27 mmol/L (ref 22–32)
Calcium: 7.9 mg/dL — ABNORMAL LOW (ref 8.9–10.3)
Chloride: 106 mmol/L (ref 98–111)
Creatinine, Ser: 0.95 mg/dL (ref 0.44–1.00)
GFR, Estimated: >60 mL/min (ref >60)
Glucose, Bld: 106 mg/dL — ABNORMAL HIGH (ref 70–99)
Potassium: 3.1 mmol/L — ABNORMAL LOW (ref 3.5–5.1)
Sodium: 139 mmol/L (ref 135–145)

## 2021-12-05 LAB — HEPARIN LEVEL (UNFRACTIONATED)
Heparin Unfractionated: 0.13 IU/mL — ABNORMAL LOW (ref 0.30–0.70)
Heparin Unfractionated: 0.25 IU/mL — ABNORMAL LOW (ref 0.30–0.70)

## 2021-12-05 LAB — GLUCOSE, CAPILLARY: Glucose-Capillary: 129 mg/dL — ABNORMAL HIGH (ref 70–99)

## 2021-12-05 MED ORDER — ASPIRIN 81 MG PO TBEC: 81.0000 mg | DELAYED_RELEASE_TABLET | Freq: Every day | ORAL | Status: DC

## 2021-12-05 MED ORDER — HEPARIN BOLUS VIA INFUSION
1200.0000 [IU] | Freq: Once | INTRAVENOUS | Status: AC
  Administered 2021-12-05: 1200 [IU] via INTRAVENOUS
  Filled 2021-12-05: qty 1200

## 2021-12-05 MED ORDER — POTASSIUM CHLORIDE 10 MEQ/100ML IV SOLN
10.0000 meq | INTRAVENOUS | Status: AC
  Administered 2021-12-05 (×3): 10 meq via INTRAVENOUS
  Filled 2021-12-05 (×3): qty 100

## 2021-12-05 MED ORDER — CLOPIDOGREL BISULFATE 75 MG PO TABS: 75.0000 mg | ORAL_TABLET | Freq: Every day | ORAL | Status: DC

## 2021-12-05 MED ORDER — HEPARIN BOLUS VIA INFUSION
600.0000 [IU] | Freq: Once | INTRAVENOUS | Status: AC
  Administered 2021-12-05: 600 [IU] via INTRAVENOUS
  Filled 2021-12-05: qty 600

## 2021-12-05 MED ORDER — LORAZEPAM 0.5 MG PO TABS: 0.2500 mg | ORAL_TABLET | Freq: Two times a day (BID) | ORAL | 0 refills | Status: AC | PRN

## 2021-12-05 NOTE — Evaluation (Signed)
Occupational Therapy Evaluation Patient Details Name: Christine [name] MRN: [id number] DOB: 1945-09-29 Today's Date: 12/05/2021   History of Present Illness Pt is a 76 y.o. female with PMHx anxiety, breast cancer, chronic systolic CHF ef 37%, CAD s/p PCI and stent and AICD placement, GERD, HTN, dyslipidemia,  COPD. OA, And PVD, who presents to the ED 12/03/2021 brought in by son with complaint of progressive left lower extremity swelling and pain. S/p angioplasty and stenting on 12/04/21.   Clinical Impression   Pt was seen for OT evaluation this date. No family present to verify PLOF provided. Per pt/chart review, pt lives alone and reports son assists with transportation to appts, groceries, and med mgt. She denies recent falls. Currently pt demonstrates impairments as described below (See OT problem list) which functionally limit her ability to perform ADL/self-care tasks. Pt currently requires supervision for safety with ADL and CGA for ADL transfers with either HHA or RW. Pt would benefit from skilled OT services to address noted impairments and functional limitations (see below for any additional details) in order to maximize safety and independence while minimizing falls risk and caregiver burden. Upon hospital discharge, recommend 24/7 supervision for safety and HHOT to maximize pt safety and return to functional independence during meaningful occupations of daily life.   Recommendations for follow up therapy are one component of a multi-disciplinary discharge planning process, led by the attending physician.  Recommendations may be updated based on patient status, additional functional criteria and insurance authorization.   Follow Up Recommendations  Home health OT    Assistance Recommended at Discharge Frequent or constant Supervision/Assistance  Patient can return home with the following A little help with walking and/or transfers;A little help with bathing/dressing/bathroom;Assistance with  cooking/housework;Assist for transportation;Help with stairs or ramp for entrance;Direct supervision/assist for medications management;Direct supervision/assist for financial management    Functional Status Assessment  Patient has had a recent decline in their functional status and demonstrates the ability to make significant improvements in function in a reasonable and predictable amount of time.  Equipment Recommendations  Other (comment) (2WW)    Recommendations for Other Services       Precautions / Restrictions Precautions Precautions: Fall Restrictions Weight Bearing Restrictions: No      Mobility Bed Mobility               General bed mobility comments: Pt seated EOB prior to OT's arrival    Transfers Overall transfer level: Needs assistance Equipment used: Rolling walker (2 wheels), None Transfers: Sit to/from Stand Sit to Stand: Min guard, Min assist           General transfer comment: handheld assist without RW, pt endorsing feeling unsteady      Balance Overall balance assessment: Needs assistance Sitting-balance support: Feet supported Sitting balance-Leahy Scale: Good     Standing balance support: During functional activity Standing balance-Leahy Scale: Fair Standing balance comment: mild LOB but able to self correct in static standing                           ADL either performed or assessed with clinical judgement   ADL                                         General ADL Comments: Pt able to complete basic ADL tasks with limited assist required, CGA for  ADL mobility/transfers. Cues for safety.     Vision         Perception     Praxis      Pertinent Vitals/Pain Pain Assessment Pain Assessment: Faces Faces Pain Scale: Hurts a little bit Pain Location: L calf Pain Descriptors / Indicators: Aching Pain Intervention(s): Limited activity within patient's tolerance, Monitored during session, Repositioned      Hand Dominance Right   Extremity/Trunk Assessment Upper Extremity Assessment Upper Extremity Assessment: Generalized weakness   Lower Extremity Assessment Lower Extremity Assessment: Generalized weakness   Cervical / Trunk Assessment Cervical / Trunk Assessment: Kyphotic   Communication Communication Communication: HOH (slight HOH)   Cognition Arousal/Alertness: Awake/alert Behavior During Therapy: WFL for tasks assessed/performed Overall Cognitive Status: No family/caregiver present to determine baseline cognitive functioning                                 General Comments: Pt alert and oriented to place, self, and somewhat to situation. She follows simple commands well, however does demonstrate decreased safety and awareness. Pt had taken off heart monitor and was talking into the call bell as if it were a phone upon OT's arrival. PRN VC for safety. Per chart review, pt with history of recent low SLUMS scores past 2 admissions indicating need for 24/7 supv/assist for safety. No family present to help verifiy baseline cognition.Will benefit from new SLUMS assessment and medication mgt task assessment.     General Comments       Exercises     Shoulder Instructions      Home Living Family/patient expects to be discharged to:: Private residence Living Arrangements: Alone   Type of Home: Apartment Home Access:  (chart review says 3-4 steps to enter)     Home Layout: One level     Bathroom Shower/Tub: Tub/shower unit         Home Equipment: Chartered certified accountant   Additional Comments: No family present to verify. Pt is a questionable historian.      Prior Functioning/Environment Prior Level of Function : Independent/Modified Independent             Mobility Comments: Pt is a questionable historian but reports she lives alone, ambulatory without AD. ADLs Comments: Pt reports she is Indep with ADL, household and community amb without AD,  cooking/cleaning. She does report that son assists with transportation, shopping/groceries, and sets up medication using am/pm pill box.        OT Problem List: Decreased strength;Decreased cognition;Decreased safety awareness;Impaired balance (sitting and/or standing);Decreased knowledge of use of DME or AE      OT Treatment/Interventions: Self-care/ADL training;Therapeutic exercise;Therapeutic activities;Cognitive remediation/compensation;DME and/or AE instruction;Patient/family education;Balance training    OT Goals(Current goals can be found in the care plan section) Acute Rehab OT Goals Patient Stated Goal: get better and go home OT Goal Formulation: With patient Time For Goal Achievement: 12/19/21 Potential to Achieve Goals: Good ADL Goals Pt Will Perform Lower Body Dressing: with supervision;sit to/from stand Pt Will Transfer to Toilet: with supervision;ambulating;regular height toilet (LRAD) Pt Will Perform Toileting - Clothing Manipulation and hygiene: with modified independence Additional ADL Goal #1: Pt will complete all aspects of bathing primarily from seated position wiht remote supervision for safety. Additional ADL Goal #2: Pt will complete med mgt task with 100% accuracy with supervision for safety.  OT Frequency: Min 2X/week    Co-evaluation  AM-PAC OT "6 Clicks" Daily Activity     Outcome Measure Help from another person eating meals?: None Help from another person taking care of personal grooming?: A Little Help from another person toileting, which includes using toliet, bedpan, or urinal?: A Little Help from another person bathing (including washing, rinsing, drying)?: A Little Help from another person to put on and taking off regular upper body clothing?: A Little Help from another person to put on and taking off regular lower body clothing?: A Little 6 Click Score: 19   End of Session    Activity Tolerance: Patient tolerated treatment  well Patient left: in chair;Other (comment) (in recliner wiht PT for additional session)  OT Visit Diagnosis: Other abnormalities of gait and mobility (R26.89)                Time: 3335-4562 OT Time Calculation (min): 12 min Charges:  OT General Charges $OT Visit: 1 Visit OT Evaluation $OT Eval Low Complexity: 1 Low  Ardeth Perfect., MPH, MS, OTR/L ascom [phone] 12/05/21, 10:28 AM

## 2021-12-05 NOTE — Evaluation (Signed)
Physical Therapy Evaluation Patient Details Name: Christine [name] MRN: [id number] DOB: 04-18-1946 Today's Date: 12/05/2021  History of Present Illness  Pt is a 76 y.o. female with PMHx anxiety, breast cancer, chronic systolic CHF ef 29%, CAD s/p PCI and stent and AICD placement, GERD, HTN, dyslipidemia,  COPD. OA, And PVD, who presents to the ED 12/03/2021 brought in by son with complaint of progressive left lower extremity swelling and pain. S/p angioplasty and stenting on 12/04/21.   Clinical Impression  Patient alert, sitting EOB with OT at start of session. Per chart review pt lives alone, but pt did state that her son has been with her recently all the time. Reported she is independent for mobility, but conflicting information about fall history.  She is able to move all extremities against gravity. Able to take a few steps to the recliner with handheld assist. Educated on RW use and improved steadiness noted. Sit <> stand several times including from standard commode, cues for safety/hand placement needed each time. She ambulated ~154f total with RW and CGA. Some gait path deviations noted and 1-2 small LOB that pt was able to correct or needed CGA to correct. Pt very verbose throughout, and often let go of the walk to gesture or left it outside BOS when distracted.  Overall the patient demonstrated deficits (see "PT Problem List") that impede the patient's functional abilities, safety, and mobility and would benefit from skilled PT intervention. Recommendation is HHPT with frequent/constant supervision/assistance. If unable to obtain adequate support at home, pt may benefit from a transition to higher levels of care.        Recommendations for follow up therapy are one component of a multi-disciplinary discharge planning process, led by the attending physician.  Recommendations may be updated based on patient status, additional functional criteria and insurance authorization.  Follow Up  Recommendations Home health PT      Assistance Recommended at Discharge Frequent or constant Supervision/Assistance  Patient can return home with the following  A little help with walking and/or transfers;A little help with bathing/dressing/bathroom;Assistance with cooking/housework;Assist for transportation;Direct supervision/assist for financial management;Direct supervision/assist for medications management;Help with stairs or ramp for entrance    Equipment Recommendations Rolling walker (2 wheels)  Recommendations for Other Services       Functional Status Assessment Patient has had a recent decline in their functional status and demonstrates the ability to make significant improvements in function in a reasonable and predictable amount of time.     Precautions / Restrictions Precautions Precautions: Fall Restrictions Weight Bearing Restrictions: No      Mobility  Bed Mobility               General bed mobility comments: sitting EOB with OT    Transfers Overall transfer level: Needs assistance Equipment used: Rolling walker (2 wheels), None Transfers: Sit to/from Stand Sit to Stand: Min guard, Min assist           General transfer comment: handheld assist with RW, CGA with RW cued for hand placement    Ambulation/Gait   Gait Distance (Feet): 130 Feet Assistive device: Rolling walker (2 wheels)   Gait velocity: decreased     General Gait Details: pt with poor safety awareness. lets go of walker while ambulating, leans to the R intermittently 1-2 small LOB but pt able to correct without assistance or with CGA.  Stairs            Wheelchair Mobility    Modified Rankin (Stroke  Patients Only)       Balance Overall balance assessment: Needs assistance Sitting-balance support: Feet supported Sitting balance-Leahy Scale: Good Sitting balance - Comments: pericare in sitting independently   Standing balance support: During functional  activity Standing balance-Leahy Scale: Fair Standing balance comment: improved safety and stability with RW                             Pertinent Vitals/Pain Pain Assessment Pain Assessment: Faces Faces Pain Scale: Hurts a little bit Pain Location: references L calf pain as well as groin site pain Pain Descriptors / Indicators: Aching, Guarding, Grimacing Pain Intervention(s): Limited activity within patient's tolerance, Repositioned, Monitored during session    Home Living Family/patient expects to be discharged to:: Private residence Living Arrangements: Alone   Type of Home: Apartment Home Access:  (chart review says 3-4 steps to enter)       Home Layout: One level Home Equipment: Standard Walker      Prior Function Prior Level of Function : Independent/Modified Independent             Mobility Comments: Pt is a questionable historian but reports she lives alone, ambulatory without AD. ADLs Comments: Pt reports she is Indep with ADL, household and community amb without AD, cooking/cleaning/shopping however reports increased difficulties with tasks since previous admission, pt appears to be a  poor historian     Hand Dominance   Dominant Hand: Right    Extremity/Trunk Assessment   Upper Extremity Assessment Upper Extremity Assessment: Defer to OT evaluation    Lower Extremity Assessment Lower Extremity Assessment: Generalized weakness    Cervical / Trunk Assessment Cervical / Trunk Assessment: Kyphotic  Communication   Communication: HOH (slight HOH)  Cognition Arousal/Alertness: Awake/alert Behavior During Therapy: WFL for tasks assessed/performed Overall Cognitive Status: Within Functional Limits for tasks assessed                                          General Comments      Exercises     Assessment/Plan    PT Assessment Patient needs continued PT services  PT Problem List Decreased strength;Decreased  mobility;Decreased safety awareness;Decreased activity tolerance;Decreased balance;Pain       PT Treatment Interventions DME instruction;Therapeutic exercise;Gait training;Balance training;Stair training;Neuromuscular re-education;Functional mobility training;Therapeutic activities;Patient/family education    PT Goals (Current goals can be found in the Care Plan section)  Acute Rehab PT Goals Patient Stated Goal: to have less pain PT Goal Formulation: With patient Time For Goal Achievement: 12/19/21 Potential to Achieve Goals: Fair    Frequency Min 2X/week     Co-evaluation               AM-PAC PT "6 Clicks" Mobility  Outcome Measure Help needed turning from your back to your side while in a flat bed without using bedrails?: None Help needed moving from lying on your back to sitting on the side of a flat bed without using bedrails?: None Help needed moving to and from a bed to a chair (including a wheelchair)?: None Help needed standing up from a chair using your arms (e.g., wheelchair or bedside chair)?: A Little Help needed to walk in hospital room?: A Little Help needed climbing 3-5 steps with a railing? : A Little 6 Click Score: 21    End of Session Equipment Utilized During Treatment:  Gait belt Activity Tolerance: Patient tolerated treatment well Patient left: in chair;with chair alarm set;with call bell/phone within reach Nurse Communication: Mobility status PT Visit Diagnosis: Other abnormalities of gait and mobility (R26.89);Difficulty in walking, not elsewhere classified (R26.2);Muscle weakness (generalized) (M62.81);Pain Pain - Right/Left: Left Pain - part of body: Leg    Time: 1610-9604 PT Time Calculation (min) (ACUTE ONLY): 21 min   Charges:   PT Evaluation $PT Eval Low Complexity: 1 Low PT Treatments $Therapeutic Activity: 8-22 mins        Lieutenant Diego PT, DPT 10:19 AM,12/05/21

## 2021-12-05 NOTE — Discharge Summary (Signed)
Physician Discharge Summary   Patient: Christine [name] MRN: [id number]  DOB: [birth year]   Admit:     Date of Admission: 12/03/2021 Admitted from: home   Discharge: Date of discharge: 12/05/21 Disposition: Home health Condition at discharge: good  CODE STATUS: FULL   Diet recommendation: Cardiac diet   Discharge Physician: Emeterio Reeve, DO [facility]     PCP: Baxter Hire, MD  Recommendations for Outpatient Follow-up:  Follow up with PCP Baxter Hire, MD in 1-2 weeks Please obtain labs/tests: BMP, CBC, TSH in 1-2 weeks Please follow up on the following pending results: none Please follow as directed w/ vascular surgery   Hospital Course:  AIRYN ELLZEY is a 76 y.o. female with medical history significant of   anxiety, breast cancer, chronic systolic CHF ef 34%, coronary artery disease s/p PCI and stent and AICD placement, GERD, hypertension, dyslipidemia,osteoarthritis,  PVD, who presents to the emergency room 12/03/2021 brought in by son with complaint of progressive left lower extremity swelling and pain.  Reporting typically she has pain on exertion and improved with rest, however has not improved with rest recently. 06/20: ED physician noted bilateral LE edema, difficulty appreciating DP bilateral pulse, prolonged cap refill, bruising to left leg, left leg cooler to the touch.  Lower extremity ultrasound no DVT.  Concerning for lower extremity ischemia.  Started on heparin in the ED. added CXR, troponins, BNP to evaluate for potential CHF considering history of reduced EF and acute hypoxemia requiring 4 L O2 via [place] in ED.  Also received Lasix in the ED, CXR questionable mild congestion.  Continued home regimen for CHF, plan to repeat Lasix as needed. D-dimer positive, CTPE neg. troponin negative/flat.  Vascular surgery consulted.  06/21: angiogram today. Echo pending to eval CHF --> EF 20-25 but fluid status iproved.  06/22: off heparin this afternoon and  stable for discharge w/ home health. I spoke to son on phone and advised she is much better but prognosis is poor for good recovery given the extent of her cardiovascular disease and she wil need to be diligent about taking medications and NOT SMOKING    Consultants:  Vascular Surgery - Dr. [name]   Procedures: Angiography 12/04/21, per op note "Successful reconstruction of the distal aorta and left iliac arteries which should suffice to alleviate the patient from her rest pain given this profound disease and the patient's frail condition with numerous comorbidities I am hopeful that this reconstruction will offer palliation for the time being.  However, I have rarely seen such a tremendous amount of atherosclerotic disease and further treatment options are certainly quite limited"       Discharge Diagnoses: Principal Problem:   Critical limb ischemia of left lower extremity ([place]) Active Problems:   Chronic obstructive pulmonary disease ([place])   Coronary artery disease without angina pectoris   HFrEF (heart failure with reduced ejection fraction) ([place])    Assessment & Plan: Critical limb ischemia of left lower extremity (HCC) hx of PVD with ABI "severe left lower extremity arterial disease" 01/2019 Heparin --> ASA + Plavix outpatient  supportive care for pain Op note from Dr [name]: "Successful reconstruction of the distal aorta and left iliac arteries which should suffice to alleviate the patient from her rest pain given this profound disease and the patient's frail condition with numerous comorbidities I am hopeful that this reconstruction will offer palliation for the time being.  However, I have rarely seen such a tremendous amount of  atherosclerotic disease and further treatment options are certainly quite limited"  HFrEF (heart failure with reduced ejection fraction) (HCC) BNP elevated, LE edema, CXR changes concerning for exacerbation Echo 09/28/2021 EF 30-35, repeat echo this  admission was worse at 25-30 but pt fluid status responded do diuresis  S/p AICD Follow w/ cardiology outpatient Confers poor prognosis and high risk   Coronary artery disease without angina pectoris Home Rx continued: Carvedilol 25 mg twice daily, irbesartan 300 mg daily substituted for home valsartan, rosuvastatin 10 mg daily  Chronic obstructive pulmonary disease ([place]) Stable, not currently in exacerbation      Discharge Instructions  Discharge Instructions     Discharge wound care:   Complete by: As directed    Wound care to chronic nonhealing full thickness wound on left chest:  Cleanse with NS, pat dry. Cover with silver hydrofiber (Aquacel Ag+ Advantage, Kellie Simmering [phone]),  top with dry gauze and secure with paper tape. Change daily and PRN dressing dislodgement. Note: May irrigate with NS to remove if adherent when changing.       Allergies as of 12/05/2021       Reactions   Atorvastatin Other (See Comments)   increased LFT's   Lisinopril Cough   Oxycodone Hcl Itching, Other (See Comments)   Restless & jittery   Simvastatin Other (See Comments)   myalgias        Medication List     STOP taking these medications    hydrOXYzine 25 MG tablet Commonly known as: ATARAX       TAKE these medications    aspirin EC 81 MG tablet Take 81 mg by mouth daily.   bisacodyl 5 MG EC tablet Commonly known as: DULCOLAX Take 5 mg by mouth daily as needed for mild constipation or moderate constipation.   carvedilol 25 MG tablet Commonly known as: COREG TAKE 1 TABLET BY MOUTH TWICE A DAY What changed: when to take this   clopidogrel 75 MG tablet Commonly known as: PLAVIX TAKE 1 TABLET BY MOUTH ONCE DAILY   isosorbide mononitrate 30 MG 24 hr tablet Commonly known as: IMDUR Take 1 tablet (30 mg total) by mouth daily.   LORazepam 0.5 MG tablet Commonly known as: ATIVAN Take 0.5 tablets (0.25 mg total) by mouth 2 (two) times daily as needed for anxiety. What  changed:  when to take this reasons to take this   pantoprazole 40 MG tablet Commonly known as: PROTONIX Take 40 mg by mouth daily.   PEG 3350 17 GM/SCOOP Powd Take 17 g by mouth daily as needed (constipation.).   rosuvastatin 10 MG tablet Commonly known as: CRESTOR Take 10 mg by mouth daily.   tamoxifen 20 MG tablet Commonly known as: NOLVADEX Take 1 tablet (20 mg total) by mouth daily.   traMADol 50 MG tablet Commonly known as: ULTRAM Take 25 mg by mouth 2 (two) times daily.   valsartan 320 MG tablet Commonly known as: DIOVAN Take 320 mg by mouth daily at 12 noon.               Discharge Care Instructions  (From admission, onward)           Start     Ordered   12/05/21 0000  Discharge wound care:       Comments: Wound care to chronic nonhealing full thickness wound on left chest:  Cleanse with NS, pat dry. Cover with silver hydrofiber (Aquacel Ag+ Advantage, Kellie Simmering [phone]),  top with dry gauze and secure with  paper tape. Change daily and PRN dressing dislodgement. Note: May irrigate with NS to remove if adherent when changing.   12/05/21 1400            Diet Orders (From admission, onward)     Start     Ordered   12/04/21 1840  DIET DYS 3 Room service appropriate? Yes; Fluid consistency: Thin  Diet effective now       Question Answer Comment  Room service appropriate? Yes   Fluid consistency: Thin      12/04/21 1839               Allergies  Allergen Reactions   Atorvastatin Other (See Comments)    increased LFT's   Lisinopril Cough   Oxycodone Hcl Itching and Other (See Comments)    Restless & jittery   Simvastatin Other (See Comments)    myalgias     Subjective: Pt feeling okay, no severe pain, has been walking around the [place] and the room some. Leg is tender and sore but she is able to walk.    Discharge Exam: Vitals:   12/05/21 1111 12/05/21 1609  BP: (!) 109/94 116/84  Pulse: 71 74  Resp: 18 17  Temp: 98.4 F (36.9  C) 99 F (37.2 C)  SpO2: 98% 98%   Vitals:   12/05/21 0335 12/05/21 0736 12/05/21 1111 12/05/21 1609  BP: 116/69 (!) 141/76 (!) 109/94 116/84  Pulse: 60 71 71 74  Resp: '19 17 18 17  '$ Temp: 97.8 F (36.6 C) 97.8 F (36.6 C) 98.4 F (36.9 C) 99 F (37.2 C)  TempSrc: Oral  Oral Oral  SpO2: 95% 97% 98% 98%  Weight:      Height:         General: Pt is alert, awake, not in acute distress Cardiovascular: RRR, S1/S2 +, no rubs, no gallops Respiratory: CTA bilaterally, no wheezing, no rhonchi, mild crackles at bases c/w atelectasis  Abdominal: Soft, NT, ND, bowel sounds + Extremities: trace bilateral LE edema, no cyanosis     The results of significant diagnostics from this hospitalization (including imaging, microbiology, ancillary and laboratory) are listed below for reference.     Microbiology: Recent Results (from the past 240 hour(s))  Respiratory (~20 pathogens) panel by PCR     Status: None   Collection Time: 12/04/21  3:56 AM   Specimen: Nasopharyngeal Swab; Respiratory  Result Value Ref Range Status   Adenovirus NOT DETECTED NOT DETECTED Final   Coronavirus 229E NOT DETECTED NOT DETECTED Final    Comment: (NOTE) The Coronavirus on the Respiratory Panel, DOES NOT test for the novel  Coronavirus (2019 nCoV)    Coronavirus HKU1 NOT DETECTED NOT DETECTED Final   Coronavirus NL63 NOT DETECTED NOT DETECTED Final   Coronavirus OC43 NOT DETECTED NOT DETECTED Final   Metapneumovirus NOT DETECTED NOT DETECTED Final   Rhinovirus / Enterovirus NOT DETECTED NOT DETECTED Final   Influenza A NOT DETECTED NOT DETECTED Final   Influenza B NOT DETECTED NOT DETECTED Final   Parainfluenza Virus 1 NOT DETECTED NOT DETECTED Final   Parainfluenza Virus 2 NOT DETECTED NOT DETECTED Final   Parainfluenza Virus 3 NOT DETECTED NOT DETECTED Final   Parainfluenza Virus 4 NOT DETECTED NOT DETECTED Final   Respiratory Syncytial Virus NOT DETECTED NOT DETECTED Final   Bordetella pertussis NOT  DETECTED NOT DETECTED Final   Bordetella Parapertussis NOT DETECTED NOT DETECTED Final   Chlamydophila pneumoniae NOT DETECTED NOT DETECTED Final   Mycoplasma pneumoniae  NOT DETECTED NOT DETECTED Final    Comment: Performed at [facility], [place] [address], [place], [place] [address]     Labs: BNP (last 3 results) Recent Labs    12/03/21 1721  BNP 8,119.1*   Basic Metabolic Panel: Recent Labs  Lab 12/03/21 1721 12/04/21 0137 12/04/21 0357 12/05/21 0153  NA 142 142 143 139  K 3.7 3.9 4.5 3.1*  CL 107 105 104 106  CO2 '26 27 30 27  '$ GLUCOSE 101* 122* 109* 106*  BUN '14 17 18 '$ 24*  CREATININE 1.01* 0.88 1.01* 0.95  CALCIUM 8.9 8.7* 8.8* 7.9*   Liver Function Tests: Recent Labs  Lab 12/04/21 0357  AST 35  ALT 16  ALKPHOS 54  BILITOT 1.3*  PROT 6.7  ALBUMIN 3.8   No results for input(s): "LIPASE", "AMYLASE" in the last 168 hours. No results for input(s): "AMMONIA" in the last 168 hours. CBC: Recent Labs  Lab 12/03/21 1721 12/04/21 0530 12/05/21 0153  WBC 7.2 13.7* 7.5  NEUTROABS 5.1  --   --   HGB 12.1 11.9* 11.0*  HCT 37.9 37.4 33.1*  MCV 93.8 92.6 88.5  PLT 118* 118* 119*   Cardiac Enzymes: No results for input(s): "CKTOTAL", "CKMB", "CKMBINDEX", "TROPONINI" in the last 168 hours. BNP: Invalid input(s): "POCBNP" CBG: Recent Labs  Lab 12/05/21 0737  GLUCAP 129*   D-Dimer Recent Labs    12/03/21 2005  DDIMER 9.50*   Hgb A1c No results for input(s): "HGBA1C" in the last 72 hours. Lipid Profile No results for input(s): "CHOL", "HDL", "LDLCALC", "TRIG", "CHOLHDL", "LDLDIRECT" in the last 72 hours. Thyroid function studies Recent Labs    12/03/21 2143  TSH 10.272*   Anemia work up No results for input(s): "VITAMINB12", "FOLATE", "FERRITIN", "TIBC", "IRON", "RETICCTPCT" in the last 72 hours. Urinalysis    Component Value Date/Time   COLORURINE COLORLESS (A) 12/03/2021 2143   APPEARANCEUR CLEAR (A) 12/03/2021 2143   LABSPEC 1.012  12/03/2021 2143   PHURINE 7.0 12/03/2021 2143   GLUCOSEU NEGATIVE 12/03/2021 2143   HGBUR SMALL (A) 12/03/2021 2143   BILIRUBINUR NEGATIVE 12/03/2021 2143   KETONESUR NEGATIVE 12/03/2021 2143   PROTEINUR NEGATIVE 12/03/2021 2143   NITRITE NEGATIVE 12/03/2021 2143   LEUKOCYTESUR NEGATIVE 12/03/2021 2143   Sepsis Labs Recent Labs  Lab 12/03/21 1721 12/04/21 0530 12/05/21 0153  WBC 7.2 13.7* 7.5   Microbiology Recent Results (from the past 240 hour(s))  Respiratory (~20 pathogens) panel by PCR     Status: None   Collection Time: 12/04/21  3:56 AM   Specimen: Nasopharyngeal Swab; Respiratory  Result Value Ref Range Status   Adenovirus NOT DETECTED NOT DETECTED Final   Coronavirus 229E NOT DETECTED NOT DETECTED Final    Comment: (NOTE) The Coronavirus on the Respiratory Panel, DOES NOT test for the novel  Coronavirus (2019 nCoV)    Coronavirus HKU1 NOT DETECTED NOT DETECTED Final   Coronavirus NL63 NOT DETECTED NOT DETECTED Final   Coronavirus OC43 NOT DETECTED NOT DETECTED Final   Metapneumovirus NOT DETECTED NOT DETECTED Final   Rhinovirus / Enterovirus NOT DETECTED NOT DETECTED Final   Influenza A NOT DETECTED NOT DETECTED Final   Influenza B NOT DETECTED NOT DETECTED Final   Parainfluenza Virus 1 NOT DETECTED NOT DETECTED Final   Parainfluenza Virus 2 NOT DETECTED NOT DETECTED Final   Parainfluenza Virus 3 NOT DETECTED NOT DETECTED Final   Parainfluenza Virus 4 NOT DETECTED NOT DETECTED Final   Respiratory Syncytial Virus NOT DETECTED NOT  DETECTED Final   Bordetella pertussis NOT DETECTED NOT DETECTED Final   Bordetella Parapertussis NOT DETECTED NOT DETECTED Final   Chlamydophila pneumoniae NOT DETECTED NOT DETECTED Final   Mycoplasma pneumoniae NOT DETECTED NOT DETECTED Final    Comment: Performed at [facility], [place] [address] [place], [place] [address]   Imaging PERIPHERAL VASCULAR CATHETERIZATION  Result Date: 12/04/2021 See surgical note for  result.  ECHOCARDIOGRAM COMPLETE  Result Date: 12/04/2021    ECHOCARDIOGRAM REPORT   Patient Name:   DOMINO HOLTEN Date of Exam: 12/04/2021 Medical Rec #:  [id number]     Height:       63.0 in Accession #:    [id number]    Weight:       88.7 lb Date of Birth:  1946/02/18    BSA:          1.369 m Patient Age:    81 years      BP:           153/83 mmHg Patient Gender: F             HR:           45 bpm. Exam Location:  [facility] Procedure: 2D Echo, Color Doppler, Cardiac Doppler and Intracardiac            Opacification Agent Indications:     R06.00 Dyspnea  History:         Patient has prior history of Echocardiogram examinations, most                  recent 09/28/2021. Defibrillator; Risk Factors:Hypertension and                  Dyslipidemia.  Sonographer:     Charmayne Sheer Referring Phys:  [id number] SARA-MAIZ A THOMAS Diagnosing Phys: Kate Sable MD  Sonographer Comments: No parasternal window and Technically challenging study due to limited acoustic windows. Image acquisition challenging due to patient body habitus and pt weighs 88 lbs. IMPRESSIONS  1. Left ventricular ejection fraction, by estimation, is 25 to 30%. The left ventricle has severely decreased function. The left ventricle demonstrates regional wall motion abnormalities (see scoring diagram/findings for description). There is mild left  ventricular hypertrophy of the basal-septal segment. Left ventricular diastolic parameters are consistent with Grade I diastolic dysfunction (impaired relaxation). There is akinesis of the left ventricular, entire apical segment.  2. Right ventricular systolic function is normal. The right ventricular size is normal.  3. Left atrial size was severely dilated.  4. A small pericardial effusion is present.  5. The mitral valve is normal in structure. Mild mitral valve regurgitation.  6. The aortic valve was not well visualized. Aortic valve regurgitation is trivial.  7. The inferior vena cava is dilated in size with <50%  respiratory variability, suggesting right atrial pressure of 15 mmHg. FINDINGS  Left Ventricle: Left ventricular ejection fraction, by estimation, is 25 to 30%. The left ventricle has severely decreased function. The left ventricle demonstrates regional wall motion abnormalities. Definity contrast agent was given IV to delineate the left ventricular endocardial borders. The left ventricular internal cavity size was normal in size. There is mild left ventricular hypertrophy of the basal-septal segment. Left ventricular diastolic parameters are consistent with Grade I diastolic dysfunction (impaired relaxation). Right Ventricle: The right ventricular size is normal. No increase in right ventricular wall thickness. Right ventricular systolic function is normal. Left Atrium: Left atrial size was severely dilated. Right Atrium: Right atrial size  was normal in size. Pericardium: A small pericardial effusion is present. Mitral Valve: The mitral valve is normal in structure. Mild mitral valve regurgitation. Tricuspid Valve: The tricuspid valve is grossly normal. Tricuspid valve regurgitation is not demonstrated. Aortic Valve: The aortic valve was not well visualized. Aortic valve regurgitation is trivial. Aortic valve mean gradient measures 1.0 mmHg. Aortic valve peak gradient measures 2.5 mmHg. Pulmonic Valve: The pulmonic valve was not well visualized. Pulmonic valve regurgitation is not visualized. Aorta: The aortic root was not well visualized. Venous: The inferior vena cava is dilated in size with less than 50% respiratory variability, suggesting right atrial pressure of 15 mmHg. IAS/Shunts: No atrial level shunt detected by color flow Doppler. Additional Comments: A device lead is visualized.   LV Volumes (MOD) LV vol d, MOD A2C: 68.3 ml Diastology LV vol d, MOD A4C: 75.5 ml LV e' medial:    3.48 cm/s LV vol s, MOD A2C: 57.2 ml LV E/e' medial:  11.4 LV vol s, MOD A4C: 61.4 ml LV e' lateral:   3.59 cm/s LV SV MOD A2C:      11.1 ml LV E/e' lateral: 11.1 LV SV MOD A4C:     75.5 ml LV SV MOD BP:      13.1 ml LEFT ATRIUM           Index LA Vol (A4C): 76.0 ml 55.53 ml/m  AORTIC VALVE AV Vmax:           79.30 cm/s AV Vmean:          48.200 cm/s AV VTI:            0.151 m AV Peak Grad:      2.5 mmHg AV Mean Grad:      1.0 mmHg LVOT Vmax:         48.30 cm/s LVOT Vmean:        29.800 cm/s LVOT VTI:          0.083 m LVOT/AV VTI ratio: 0.55 MITRAL VALVE MV Area (PHT): 4.89 cm    SHUNTS MV Decel Time: 155 msec    Systemic VTI: 0.08 m MV E velocity: 39.80 cm/s MV A velocity: 50.10 cm/s MV E/A ratio:  0.79 Kate Sable MD Electronically signed by Kate Sable MD Signature Date/Time: 12/04/2021/1:51:51 PM    Final    US Venous Img Lower Unilateral Right (DVT)  Result Date: 12/04/2021 CLINICAL DATA:  Right leg swelling EXAM: RIGHT LOWER EXTREMITY VENOUS DOPPLER ULTRASOUND TECHNIQUE: Gray-scale sonography with compression, as well as color and duplex ultrasound, were performed to evaluate the deep venous system(s) from the level of the common femoral vein through the popliteal and proximal calf veins. COMPARISON:  None Available. FINDINGS: VENOUS Normal compressibility of the common femoral, superficial femoral, and popliteal veins, as well as the visualized calf veins. Visualized portions of profunda femoral vein and great saphenous vein unremarkable. No filling defects to suggest DVT on grayscale or color Doppler imaging. Doppler waveforms show normal direction of venous flow, normal respiratory plasticity and response to augmentation. Limited views of the contralateral common femoral vein are unremarkable. OTHER Pulsatile venous waveforms in upper legs, presumably from right heart failure based on recent CTA. Limitations: none IMPRESSION: 1. Negative for DVT in the right lower extremity. 2. Pulsatile venous waveforms, likely transmitted from the enlarged right heart. Electronically Signed   By: Jorje Guild M.D.   On:  12/04/2021 05:16      Time coordinating discharge: Over 30 minutes  SIGNED:  Emeterio Reeve DO [facility]

## 2021-12-05 NOTE — Care Management (Signed)
Pre-rounds chart review and preliminary plan / goals for today: Significant overnight events per chart: none Significant new findings on labs/other diagnostics:  low K, at 3.1 replete IV WBC improved Hgb stable Echo: LVEF 25-30% w/ wall motion abn and Grade I Ddf, akinesis of L apical ventricle Dispo plan: pt has expressed desire for home, will see about PT/OT may ned HH  Pending/Plan:  PT/OT eval orders placed Appreciate vascular recs re:  discharge readiness (anticipate today/tomorrow) Heparin drip --> po meds ok?       Please feel free to reach out via secure chat in Epic for non-urgent issues. Please page for urgent matters!  This note will be updated after rounds to full progress note / discharge note as appropriate.

## 2021-12-05 NOTE — Progress Notes (Signed)
[place] for Heparin Indication:  limb ischemia  Allergies  Allergen Reactions   Atorvastatin Other (See Comments)    increased LFT's   Lisinopril Cough   Oxycodone Hcl Itching and Other (See Comments)    Restless & jittery   Simvastatin Other (See Comments)    myalgias    Patient Measurements: Height: '5\' 3"'$  (160 cm) Weight: 35.9 kg (79 lb 1.6 oz) (zeroed out the scale twice - to verify correct weight) IBW/kg (Calculated) : 52.4 Heparin Dosing Weight: 40.2 kg  Vital Signs: Temp: 98.4 F (36.9 C) (06/22 1111) Temp Source: Oral (06/22 1111) BP: 109/94 (06/22 1111) Pulse Rate: 71 (06/22 1111)  Labs: Recent Labs    12/03/21 1721 12/03/21 1721 12/03/21 2005 12/03/21 2143 12/04/21 0137 12/04/21 0357 12/04/21 0530 12/04/21 0638 12/05/21 0153 12/05/21 1101  HGB 12.1  --   --   --   --   --  11.9*  --  11.0*  --   HCT 37.9  --   --   --   --   --  37.4  --  33.1*  --   PLT 118*  --   --   --   --   --  118*  --  119*  --   APTT  --   --  31  --   --   --   --   --   --   --   LABPROT  --   --  14.8  --   --   --  16.3*  --   --   --   INR  --   --  1.2  --   --   --  1.3*  --   --   --   HEPARINUNFRC  --    < >  --   --   --   --  >1.10* 0.14* 0.13* 0.25*  CREATININE 1.01*  --   --   --  0.88 1.01*  --   --  0.95  --   TROPONINIHS 24*  --  28*   < > 128* 154* 145*  --   --   --    < > = values in this interval not displayed.     Estimated Creatinine Clearance: 29.4 mL/min (by C-G formula based on SCr of 0.95 mg/dL).   Medical History: Past Medical History:  Diagnosis Date   Allergic rhinitis    Anxiety    Breast cancer ([place])    11/26/20 Pt reports wound/lesion on left breast   Chronic systolic heart failure ([facility])    Coronary artery disease    GERD (gastroesophageal reflux disease)    Heart murmur    Hyperlipidemia    Hypertension    Implantable defibrillator [employer]    DOI 2006   MI (myocardial infarction) ([place])     Osteoarthritis    Wears dentures    full upper and lower    Medications:  Medications Prior to Admission  Medication Sig Dispense Refill Last Dose   aspirin EC 81 MG tablet Take 81 mg by mouth daily.   Past Week   bisacodyl (DULCOLAX) 5 MG EC tablet Take 5 mg by mouth daily as needed for mild constipation or moderate constipation.   Past Week   carvedilol (COREG) 25 MG tablet TAKE 1 TABLET BY MOUTH TWICE A DAY (Patient taking differently: Take 25 mg by mouth 2 (two) times daily with a  meal.) 60 tablet 2 Past Week   clopidogrel (PLAVIX) 75 MG tablet TAKE 1 TABLET BY MOUTH ONCE DAILY (Patient taking differently: Take 75 mg by mouth daily.) 30 tablet 4 Past Week   hydrOXYzine (ATARAX) 25 MG tablet Take 25 mg by mouth 3 (three) times daily.   Past Week   LORazepam (ATIVAN) 0.5 MG tablet Take 0.25 mg by mouth 2 (two) times daily.    Past Week   pantoprazole (PROTONIX) 40 MG tablet Take 40 mg by mouth daily.    Past Week   Polyethylene Glycol 3350 (PEG 3350) POWD Take 17 g by mouth daily as needed (constipation.).    Past Week at prn   rosuvastatin (CRESTOR) 10 MG tablet Take 10 mg by mouth daily.    Past Week   tamoxifen (NOLVADEX) 20 MG tablet Take 1 tablet (20 mg total) by mouth daily. 90 tablet 3 Past Week   traMADol (ULTRAM) 50 MG tablet Take 25 mg by mouth 2 (two) times daily.   Past Week at 0800   valsartan (DIOVAN) 320 MG tablet Take 320 mg by mouth daily at 12 noon.    Past Week at 1200   isosorbide mononitrate (IMDUR) 30 MG 24 hr tablet Take 1 tablet (30 mg total) by mouth daily. 30 tablet 0    Scheduled:   carvedilol  25 mg Oral BID   irbesartan  300 mg Oral Daily   pantoprazole  40 mg Oral Daily   rosuvastatin  10 mg Oral Daily   sodium chloride flush  3 mL Intravenous Q12H   tamoxifen  20 mg Oral Daily   traMADol  25 mg Oral BID   Infusions:   sodium chloride     sodium chloride     heparin 800 Units/hr (12/05/21 1015)   potassium chloride 10 mEq (12/05/21 1135)   PRN:   Anti-infectives (From admission, onward)    Start     Dose/Rate Route Frequency Ordered Stop   12/04/21 1445  ceFAZolin (ANCEF) IVPB 2g/100 mL premix  Status:  Discontinued        2 g 200 mL/hr over 30 Minutes Intravenous  Once 12/04/21 1351 12/04/21 2046   12/04/21 1351  ceFAZolin (ANCEF) 2-4 GM/100ML-% IVPB       Note to Pharmacy: Corlis Hove H: cabinet override      12/04/21 1351 12/04/21 2016   12/04/21 0600  ceFAZolin (ANCEF) IVPB 2g/100 mL premix        2 g 200 mL/hr over 30 Minutes Intravenous On call to O.R. 12/03/21 2133 12/04/21 5465       Assessment: Pharmacy consulted to start heparin for an limb ischemia. No note of DOAC PTA.   Goal of Therapy:  Heparin level 0.3-0.7 units/ml Monitor platelets by anticoagulation protocol: Yes   Results: Date/time aPTT/HL Comment 6/21'@0530'$  > 1.10  Error 6/21'@0630'$  0.14  Sub-therapeutic  6/22'@1101'$  0.25  Sub-therapeutic  Plan:  Will bolus heparin at 600 units x 1 Increase rate from 800 to 900 units/hr Repeat HL 8hrs after rate change Follow up daily CBC while on heparin drip  Natalyah Cummiskey Rodriguez-Guzman PharmD, BCPS 12/05/2021 12:04 PM

## 2021-12-11 LAB — BLOOD GAS, ARTERIAL
Acid-Base Excess: 4.5 mmol/L — ABNORMAL HIGH (ref 0.0–2.0)
Bicarbonate: 31.2 mmol/L — ABNORMAL HIGH (ref 20.0–28.0)
O2 Saturation: 12.3 %
Patient temperature: 37
pCO2 arterial: 54 mmHg — ABNORMAL HIGH (ref 32–48)
pH, Arterial: 7.37 (ref 7.35–7.45)
pO2, Arterial: <31 mmHg — CL (ref 83–108)

## 2022-01-13 ENCOUNTER — Other Ambulatory Visit: Payer: Self-pay

## 2022-01-13 ENCOUNTER — Emergency Department: Payer: Medicare Other

## 2022-01-13 ENCOUNTER — Encounter: Payer: Self-pay | Admitting: Emergency Medicine

## 2022-01-13 DIAGNOSIS — G9341 Metabolic encephalopathy: Secondary | ICD-10-CM | POA: Diagnosis present

## 2022-01-13 DIAGNOSIS — Z823 Family history of stroke: Secondary | ICD-10-CM

## 2022-01-13 DIAGNOSIS — Z95810 Presence of automatic (implantable) cardiac defibrillator: Secondary | ICD-10-CM

## 2022-01-13 DIAGNOSIS — Z7982 Long term (current) use of aspirin: Secondary | ICD-10-CM

## 2022-01-13 DIAGNOSIS — I252 Old myocardial infarction: Secondary | ICD-10-CM

## 2022-01-13 DIAGNOSIS — I2510 Atherosclerotic heart disease of native coronary artery without angina pectoris: Secondary | ICD-10-CM | POA: Diagnosis present

## 2022-01-13 DIAGNOSIS — S21102D Unspecified open wound of left front wall of thorax without penetration into thoracic cavity, subsequent encounter: Secondary | ICD-10-CM

## 2022-01-13 DIAGNOSIS — N390 Urinary tract infection, site not specified: Secondary | ICD-10-CM | POA: Diagnosis not present

## 2022-01-13 DIAGNOSIS — K219 Gastro-esophageal reflux disease without esophagitis: Secondary | ICD-10-CM | POA: Diagnosis present

## 2022-01-13 DIAGNOSIS — I714 Abdominal aortic aneurysm, without rupture, unspecified: Secondary | ICD-10-CM | POA: Diagnosis present

## 2022-01-13 DIAGNOSIS — J449 Chronic obstructive pulmonary disease, unspecified: Secondary | ICD-10-CM | POA: Diagnosis present

## 2022-01-13 DIAGNOSIS — Z602 Problems related to living alone: Secondary | ICD-10-CM | POA: Diagnosis present

## 2022-01-13 DIAGNOSIS — I959 Hypotension, unspecified: Secondary | ICD-10-CM | POA: Diagnosis present

## 2022-01-13 DIAGNOSIS — Z79899 Other long term (current) drug therapy: Secondary | ICD-10-CM

## 2022-01-13 DIAGNOSIS — Z681 Body mass index (BMI) 19 or less, adult: Secondary | ICD-10-CM

## 2022-01-13 DIAGNOSIS — Z7902 Long term (current) use of antithrombotics/antiplatelets: Secondary | ICD-10-CM

## 2022-01-13 DIAGNOSIS — Z853 Personal history of malignant neoplasm of breast: Secondary | ICD-10-CM

## 2022-01-13 DIAGNOSIS — I130 Hypertensive heart and chronic kidney disease with heart failure and stage 1 through stage 4 chronic kidney disease, or unspecified chronic kidney disease: Principal | ICD-10-CM | POA: Diagnosis present

## 2022-01-13 DIAGNOSIS — I493 Ventricular premature depolarization: Secondary | ICD-10-CM | POA: Diagnosis not present

## 2022-01-13 DIAGNOSIS — I739 Peripheral vascular disease, unspecified: Secondary | ICD-10-CM | POA: Diagnosis present

## 2022-01-13 DIAGNOSIS — Z885 Allergy status to narcotic agent status: Secondary | ICD-10-CM

## 2022-01-13 DIAGNOSIS — F419 Anxiety disorder, unspecified: Secondary | ICD-10-CM | POA: Diagnosis present

## 2022-01-13 DIAGNOSIS — Z8249 Family history of ischemic heart disease and other diseases of the circulatory system: Secondary | ICD-10-CM

## 2022-01-13 DIAGNOSIS — E43 Unspecified severe protein-calorie malnutrition: Secondary | ICD-10-CM | POA: Diagnosis present

## 2022-01-13 DIAGNOSIS — I5043 Acute on chronic combined systolic (congestive) and diastolic (congestive) heart failure: Secondary | ICD-10-CM | POA: Diagnosis present

## 2022-01-13 DIAGNOSIS — Z888 Allergy status to other drugs, medicaments and biological substances status: Secondary | ICD-10-CM

## 2022-01-13 DIAGNOSIS — E785 Hyperlipidemia, unspecified: Secondary | ICD-10-CM | POA: Diagnosis present

## 2022-01-13 DIAGNOSIS — F17210 Nicotine dependence, cigarettes, uncomplicated: Secondary | ICD-10-CM | POA: Diagnosis present

## 2022-01-13 DIAGNOSIS — Z955 Presence of coronary angioplasty implant and graft: Secondary | ICD-10-CM

## 2022-01-13 DIAGNOSIS — Z66 Do not resuscitate: Secondary | ICD-10-CM | POA: Diagnosis present

## 2022-01-13 DIAGNOSIS — C7800 Secondary malignant neoplasm of unspecified lung: Secondary | ICD-10-CM | POA: Diagnosis present

## 2022-01-13 DIAGNOSIS — D696 Thrombocytopenia, unspecified: Secondary | ICD-10-CM | POA: Diagnosis present

## 2022-01-13 DIAGNOSIS — N1831 Chronic kidney disease, stage 3a: Secondary | ICD-10-CM | POA: Diagnosis present

## 2022-01-13 DIAGNOSIS — M1990 Unspecified osteoarthritis, unspecified site: Secondary | ICD-10-CM | POA: Diagnosis present

## 2022-01-13 DIAGNOSIS — R64 Cachexia: Secondary | ICD-10-CM | POA: Diagnosis present

## 2022-01-13 LAB — CBC
HCT: 36.2 % (ref 36.0–46.0)
Hemoglobin: 11.5 g/dL — ABNORMAL LOW (ref 12.0–15.0)
MCH: 29.2 pg (ref 26.0–34.0)
MCHC: 31.8 g/dL (ref 30.0–36.0)
MCV: 91.9 fL (ref 80.0–100.0)
Platelets: 86 10*3/uL — ABNORMAL LOW (ref 150–400)
RBC: 3.94 MIL/uL (ref 3.87–5.11)
RDW: 14.6 % (ref 11.5–15.5)
WBC: 5 10*3/uL (ref 4.0–10.5)
nRBC: 0 % (ref 0.0–0.2)

## 2022-01-13 LAB — COMPREHENSIVE METABOLIC PANEL
ALT: 12 U/L (ref 0–44)
AST: 22 U/L (ref 15–41)
Albumin: 3.4 g/dL — ABNORMAL LOW (ref 3.5–5.0)
Alkaline Phosphatase: 49 U/L (ref 38–126)
Anion gap: 5 (ref 5–15)
BUN: 13 mg/dL (ref 8–23)
CO2: 27 mmol/L (ref 22–32)
Calcium: 8.4 mg/dL — ABNORMAL LOW (ref 8.9–10.3)
Chloride: 108 mmol/L (ref 98–111)
Creatinine, Ser: 1.1 mg/dL — ABNORMAL HIGH (ref 0.44–1.00)
GFR, Estimated: 53 mL/min — ABNORMAL LOW (ref >60)
Glucose, Bld: 123 mg/dL — ABNORMAL HIGH (ref 70–99)
Potassium: 3.8 mmol/L (ref 3.5–5.1)
Sodium: 140 mmol/L (ref 135–145)
Total Bilirubin: 0.8 mg/dL (ref 0.3–1.2)
Total Protein: 6.1 g/dL — ABNORMAL LOW (ref 6.5–8.1)

## 2022-01-13 LAB — ETHANOL: Alcohol, Ethyl (B): <10 mg/dL (ref <10)

## 2022-01-13 LAB — ACETAMINOPHEN LEVEL: Acetaminophen (Tylenol), Serum: <10 ug/mL — ABNORMAL LOW (ref 10–30)

## 2022-01-13 LAB — SALICYLATE LEVEL: Salicylate Lvl: <7 mg/dL — ABNORMAL LOW (ref 7.0–30.0)

## 2022-01-13 NOTE — ED Notes (Signed)
Pt dressed out into appropriate hospital attire with this tech and Jinny Blossom, RN in the rm. Pt belongings consist of: a black bag with a pack of cigarettes, a lighter and a house key inside, bedroom slippers, pajama pants with hearts on them, a pink shirt, a sweater, and a red flash light. Pt belongings placed into one pt belongings bag and labeled with pt name. Pt has one bag of belongings. Pt calm and cooperative while dressing out.

## 2022-01-13 NOTE — ED Triage Notes (Signed)
First RN Note: Pt to ED via [place] [employer] from her apartment [employer]. Per [place] [employer] pt's neighbor called PD after pt came to door stating she was seeing people from "another planet coming through the ceiling". Per [place] [employer] office [place] pt came to ED voluntarily, is mentating well in conversation.

## 2022-01-13 NOTE — ED Triage Notes (Signed)
Pt states "I"m seeing things that aren't really there", pt states she knows they aren't really there because "they disappear". Pt states her PCP drained her blood out of her body and replaced it with new blood. Pt also c/o pain to her bones and joints.

## 2022-01-13 NOTE — ED Triage Notes (Signed)
1 pair slippers, 1 pair pajama pants, 1 t shirt, 1 sweater, 1 flash light, 1 pack ciggarettes, 1 lighter, 1 apartment key,   Pt dressed out by this RN and Linus Orn, EDT.

## 2022-01-14 ENCOUNTER — Other Ambulatory Visit: Payer: Self-pay

## 2022-01-14 ENCOUNTER — Inpatient Hospital Stay
Admission: EM | Admit: 2022-01-14 | Discharge: 2022-01-22 | DRG: 291 | Disposition: A | Discharge status: Skilled Nursing Facility | Payer: Medicare Other | Attending: Internal Medicine | Admitting: Internal Medicine

## 2022-01-14 ENCOUNTER — Emergency Department: Payer: Medicare Other

## 2022-01-14 ENCOUNTER — Encounter: Payer: Self-pay | Admitting: Radiology

## 2022-01-14 DIAGNOSIS — J449 Chronic obstructive pulmonary disease, unspecified: Secondary | ICD-10-CM | POA: Diagnosis present

## 2022-01-14 DIAGNOSIS — D696 Thrombocytopenia, unspecified: Secondary | ICD-10-CM | POA: Diagnosis present

## 2022-01-14 DIAGNOSIS — M1990 Unspecified osteoarthritis, unspecified site: Secondary | ICD-10-CM | POA: Diagnosis present

## 2022-01-14 DIAGNOSIS — I959 Hypotension, unspecified: Secondary | ICD-10-CM | POA: Diagnosis present

## 2022-01-14 DIAGNOSIS — I493 Ventricular premature depolarization: Secondary | ICD-10-CM | POA: Diagnosis not present

## 2022-01-14 DIAGNOSIS — S21102A Unspecified open wound of left front wall of thorax without penetration into thoracic cavity, initial encounter: Secondary | ICD-10-CM

## 2022-01-14 DIAGNOSIS — E785 Hyperlipidemia, unspecified: Secondary | ICD-10-CM | POA: Diagnosis present

## 2022-01-14 DIAGNOSIS — G9341 Metabolic encephalopathy: Secondary | ICD-10-CM | POA: Diagnosis present

## 2022-01-14 DIAGNOSIS — Z7982 Long term (current) use of aspirin: Secondary | ICD-10-CM | POA: Diagnosis not present

## 2022-01-14 DIAGNOSIS — I2510 Atherosclerotic heart disease of native coronary artery without angina pectoris: Secondary | ICD-10-CM | POA: Diagnosis present

## 2022-01-14 DIAGNOSIS — I130 Hypertensive heart and chronic kidney disease with heart failure and stage 1 through stage 4 chronic kidney disease, or unspecified chronic kidney disease: Secondary | ICD-10-CM | POA: Diagnosis present

## 2022-01-14 DIAGNOSIS — Z66 Do not resuscitate: Secondary | ICD-10-CM | POA: Diagnosis present

## 2022-01-14 DIAGNOSIS — T148XXA Other injury of unspecified body region, initial encounter: Secondary | ICD-10-CM | POA: Diagnosis present

## 2022-01-14 DIAGNOSIS — C7800 Secondary malignant neoplasm of unspecified lung: Secondary | ICD-10-CM | POA: Diagnosis present

## 2022-01-14 DIAGNOSIS — Z853 Personal history of malignant neoplasm of breast: Secondary | ICD-10-CM

## 2022-01-14 DIAGNOSIS — E43 Unspecified severe protein-calorie malnutrition: Secondary | ICD-10-CM | POA: Diagnosis present

## 2022-01-14 DIAGNOSIS — N1831 Chronic kidney disease, stage 3a: Secondary | ICD-10-CM

## 2022-01-14 DIAGNOSIS — N390 Urinary tract infection, site not specified: Secondary | ICD-10-CM | POA: Diagnosis present

## 2022-01-14 DIAGNOSIS — R64 Cachexia: Secondary | ICD-10-CM | POA: Diagnosis present

## 2022-01-14 DIAGNOSIS — K219 Gastro-esophageal reflux disease without esophagitis: Secondary | ICD-10-CM | POA: Diagnosis present

## 2022-01-14 DIAGNOSIS — R4182 Altered mental status, unspecified: Principal | ICD-10-CM

## 2022-01-14 DIAGNOSIS — I10 Essential (primary) hypertension: Secondary | ICD-10-CM | POA: Diagnosis present

## 2022-01-14 DIAGNOSIS — I509 Heart failure, unspecified: Secondary | ICD-10-CM

## 2022-01-14 DIAGNOSIS — I5043 Acute on chronic combined systolic (congestive) and diastolic (congestive) heart failure: Secondary | ICD-10-CM | POA: Diagnosis present

## 2022-01-14 DIAGNOSIS — F419 Anxiety disorder, unspecified: Secondary | ICD-10-CM | POA: Diagnosis present

## 2022-01-14 DIAGNOSIS — Z955 Presence of coronary angioplasty implant and graft: Secondary | ICD-10-CM | POA: Diagnosis not present

## 2022-01-14 DIAGNOSIS — I739 Peripheral vascular disease, unspecified: Secondary | ICD-10-CM | POA: Diagnosis present

## 2022-01-14 DIAGNOSIS — R636 Underweight: Secondary | ICD-10-CM | POA: Diagnosis present

## 2022-01-14 DIAGNOSIS — S21102D Unspecified open wound of left front wall of thorax without penetration into thoracic cavity, subsequent encounter: Secondary | ICD-10-CM | POA: Diagnosis not present

## 2022-01-14 DIAGNOSIS — Z681 Body mass index (BMI) 19 or less, adult: Secondary | ICD-10-CM | POA: Diagnosis not present

## 2022-01-14 DIAGNOSIS — F17210 Nicotine dependence, cigarettes, uncomplicated: Secondary | ICD-10-CM | POA: Diagnosis present

## 2022-01-14 LAB — BRAIN NATRIURETIC PEPTIDE: B Natriuretic Peptide: 2034.9 pg/mL — ABNORMAL HIGH (ref 0.0–100.0)

## 2022-01-14 LAB — URINE DRUG SCREEN, QUALITATIVE (ARMC ONLY)
Amphetamines, Ur Screen: NOT DETECTED
Barbiturates, Ur Screen: NOT DETECTED
Benzodiazepine, Ur Scrn: NOT DETECTED
Cannabinoid 50 Ng, Ur ~~LOC~~: NOT DETECTED
Cocaine Metabolite,Ur ~~LOC~~: NOT DETECTED
MDMA (Ecstasy)Ur Screen: NOT DETECTED
Methadone Scn, Ur: NOT DETECTED
Opiate, Ur Screen: NOT DETECTED
Phencyclidine (PCP) Ur S: NOT DETECTED
Tricyclic, Ur Screen: NOT DETECTED

## 2022-01-14 LAB — URINALYSIS, COMPLETE (UACMP) WITH MICROSCOPIC
Bilirubin Urine: NEGATIVE
Glucose, UA: NEGATIVE mg/dL
Ketones, ur: NEGATIVE mg/dL
Nitrite: NEGATIVE
Protein, ur: 30 mg/dL — AB
Specific Gravity, Urine: 1.01 (ref 1.005–1.030)
pH: 6 (ref 5.0–8.0)

## 2022-01-14 LAB — TROPONIN I (HIGH SENSITIVITY): Troponin I (High Sensitivity): 11 ng/L (ref <18)

## 2022-01-14 LAB — LACTIC ACID, PLASMA: Lactic Acid, Venous: 1.8 mmol/L (ref 0.5–1.9)

## 2022-01-14 MED ORDER — CARVEDILOL 25 MG PO TABS
25.0000 mg | ORAL_TABLET | Freq: Two times a day (BID) | ORAL | Status: DC
  Administered 2022-01-14: 25 mg via ORAL
  Filled 2022-01-14: qty 1

## 2022-01-14 MED ORDER — SODIUM CHLORIDE 0.9 % IV SOLN
2.0000 g | Freq: Once | INTRAVENOUS | Status: AC
  Administered 2022-01-14: 2 g via INTRAVENOUS
  Filled 2022-01-14: qty 20

## 2022-01-14 MED ORDER — LORAZEPAM 0.5 MG PO TABS: 0.2500 mg | ORAL_TABLET | Freq: Two times a day (BID) | ORAL | Status: DC | PRN

## 2022-01-14 MED ORDER — ENSURE ENLIVE PO LIQD
237.0000 mL | Freq: Two times a day (BID) | ORAL | Status: DC
  Administered 2022-01-14 – 2022-01-15 (×2): 237 mL via ORAL

## 2022-01-14 MED ORDER — HYDRALAZINE HCL 20 MG/ML IJ SOLN
5.0000 mg | INTRAMUSCULAR | Status: DC | PRN
Start: 2022-01-14 — End: 2022-01-22

## 2022-01-14 MED ORDER — DM-GUAIFENESIN ER 30-600 MG PO TB12: 1.0000 | ORAL_TABLET | Freq: Two times a day (BID) | ORAL | Status: DC | PRN

## 2022-01-14 MED ORDER — SODIUM CHLORIDE 0.9 % IV SOLN
1.0000 g | INTRAVENOUS | Status: DC
  Administered 2022-01-15 – 2022-01-17 (×3): 1 g via INTRAVENOUS
  Filled 2022-01-14: qty 10
  Filled 2022-01-14: qty 1
  Filled 2022-01-14 (×2): qty 10

## 2022-01-14 MED ORDER — FUROSEMIDE 10 MG/ML IJ SOLN
20.0000 mg | Freq: Two times a day (BID) | INTRAMUSCULAR | Status: DC
  Administered 2022-01-14 – 2022-01-15 (×2): 20 mg via INTRAVENOUS
  Filled 2022-01-14 (×2): qty 2

## 2022-01-14 MED ORDER — TRAMADOL HCL 50 MG PO TABS
25.0000 mg | ORAL_TABLET | Freq: Two times a day (BID) | ORAL | Status: DC
  Administered 2022-01-14 – 2022-01-22 (×17): 25 mg via ORAL
  Filled 2022-01-14 (×17): qty 1

## 2022-01-14 MED ORDER — ACETAMINOPHEN 325 MG PO TABS
650.0000 mg | ORAL_TABLET | Freq: Four times a day (QID) | ORAL | Status: DC | PRN
  Administered 2022-01-19 (×2): 650 mg via ORAL
  Filled 2022-01-14 (×2): qty 2

## 2022-01-14 MED ORDER — ENOXAPARIN SODIUM 30 MG/0.3ML IJ SOSY
30.0000 mg | PREFILLED_SYRINGE | INTRAMUSCULAR | Status: DC
  Administered 2022-01-14: 30 mg via SUBCUTANEOUS
  Filled 2022-01-14: qty 0.3

## 2022-01-14 MED ORDER — ONDANSETRON HCL 4 MG PO TABS: 4.0000 mg | ORAL_TABLET | Freq: Four times a day (QID) | ORAL | Status: DC | PRN

## 2022-01-14 MED ORDER — SODIUM CHLORIDE 0.9 % IV SOLN
1.0000 g | Freq: Once | INTRAVENOUS | Status: DC
  Filled 2022-01-14: qty 10

## 2022-01-14 MED ORDER — POLYETHYLENE GLYCOL 3350 17 G PO PACK: 17.0000 g | PACK | Freq: Every day | ORAL | Status: DC | PRN

## 2022-01-14 MED ORDER — TAMOXIFEN CITRATE 10 MG PO TABS
20.0000 mg | ORAL_TABLET | Freq: Every day | ORAL | Status: DC
  Administered 2022-01-14 – 2022-01-22 (×9): 20 mg via ORAL
  Filled 2022-01-14 (×9): qty 2

## 2022-01-14 MED ORDER — NICOTINE 21 MG/24HR TD PT24
21.0000 mg | MEDICATED_PATCH | Freq: Every day | TRANSDERMAL | Status: DC
  Administered 2022-01-14 – 2022-01-22 (×9): 21 mg via TRANSDERMAL
  Filled 2022-01-14 (×9): qty 1

## 2022-01-14 MED ORDER — FUROSEMIDE 10 MG/ML IJ SOLN
40.0000 mg | Freq: Once | INTRAMUSCULAR | Status: AC
  Administered 2022-01-14: 40 mg via INTRAVENOUS
  Filled 2022-01-14: qty 4

## 2022-01-14 MED ORDER — FUROSEMIDE 10 MG/ML IJ SOLN
40.0000 mg | Freq: Two times a day (BID) | INTRAMUSCULAR | Status: DC
  Administered 2022-01-14: 40 mg via INTRAVENOUS
  Filled 2022-01-14: qty 4

## 2022-01-14 MED ORDER — HYDROXYZINE HCL 50 MG PO TABS
25.0000 mg | ORAL_TABLET | Freq: Three times a day (TID) | ORAL | Status: DC
  Administered 2022-01-14 – 2022-01-22 (×24): 25 mg via ORAL
  Filled 2022-01-14 (×24): qty 1

## 2022-01-14 MED ORDER — BISACODYL 5 MG PO TBEC: 5.0000 mg | DELAYED_RELEASE_TABLET | Freq: Every day | ORAL | Status: DC | PRN

## 2022-01-14 MED ORDER — ACETAMINOPHEN 650 MG RE SUPP: 650.0000 mg | Freq: Four times a day (QID) | RECTAL | Status: DC | PRN

## 2022-01-14 MED ORDER — TRAZODONE HCL 50 MG PO TABS
25.0000 mg | ORAL_TABLET | Freq: Every evening | ORAL | Status: DC | PRN
  Administered 2022-01-19: 25 mg via ORAL
  Filled 2022-01-14: qty 1

## 2022-01-14 MED ORDER — ASPIRIN 81 MG PO TBEC
81.0000 mg | DELAYED_RELEASE_TABLET | Freq: Every day | ORAL | Status: DC
  Administered 2022-01-14 – 2022-01-22 (×9): 81 mg via ORAL
  Filled 2022-01-14 (×9): qty 1

## 2022-01-14 MED ORDER — IRBESARTAN 150 MG PO TABS
300.0000 mg | ORAL_TABLET | Freq: Every day | ORAL | Status: DC
  Administered 2022-01-14 – 2022-01-15 (×2): 300 mg via ORAL
  Filled 2022-01-14 (×2): qty 2

## 2022-01-14 MED ORDER — LORAZEPAM 0.5 MG PO TABS: 0.2500 mg | ORAL_TABLET | Freq: Three times a day (TID) | ORAL | Status: DC | PRN

## 2022-01-14 MED ORDER — CLOPIDOGREL BISULFATE 75 MG PO TABS
75.0000 mg | ORAL_TABLET | Freq: Every day | ORAL | Status: DC
  Administered 2022-01-14 – 2022-01-22 (×9): 75 mg via ORAL
  Filled 2022-01-14 (×9): qty 1

## 2022-01-14 MED ORDER — IRBESARTAN 150 MG PO TABS
300.0000 mg | ORAL_TABLET | Freq: Every day | ORAL | Status: DC
  Administered 2022-01-14: 300 mg via ORAL
  Filled 2022-01-14: qty 2

## 2022-01-14 MED ORDER — LORAZEPAM 2 MG/ML IJ SOLN
0.5000 mg | Freq: Once | INTRAMUSCULAR | Status: AC
  Administered 2022-01-14: 0.5 mg via INTRAVENOUS
  Filled 2022-01-14: qty 1

## 2022-01-14 MED ORDER — MAGNESIUM HYDROXIDE 400 MG/5ML PO SUSP: 30.0000 mL | Freq: Every day | ORAL | Status: DC | PRN

## 2022-01-14 MED ORDER — DIPHENHYDRAMINE HCL 50 MG/ML IJ SOLN: 12.5000 mg | Freq: Three times a day (TID) | INTRAMUSCULAR | Status: DC | PRN

## 2022-01-14 MED ORDER — PANTOPRAZOLE SODIUM 40 MG PO TBEC
40.0000 mg | DELAYED_RELEASE_TABLET | Freq: Every day | ORAL | Status: DC
  Administered 2022-01-14 – 2022-01-22 (×9): 40 mg via ORAL
  Filled 2022-01-14 (×9): qty 1

## 2022-01-14 MED ORDER — ALBUTEROL SULFATE (2.5 MG/3ML) 0.083% IN NEBU: 3.0000 mL | INHALATION_SOLUTION | RESPIRATORY_TRACT | Status: DC | PRN

## 2022-01-14 MED ORDER — CARVEDILOL 12.5 MG PO TABS
12.5000 mg | ORAL_TABLET | Freq: Two times a day (BID) | ORAL | Status: DC
  Administered 2022-01-15 – 2022-01-17 (×5): 12.5 mg via ORAL
  Filled 2022-01-14 (×5): qty 1

## 2022-01-14 MED ORDER — ROSUVASTATIN CALCIUM 10 MG PO TABS
10.0000 mg | ORAL_TABLET | Freq: Every day | ORAL | Status: DC
  Administered 2022-01-14 – 2022-01-22 (×9): 10 mg via ORAL
  Filled 2022-01-14 (×9): qty 1

## 2022-01-14 MED ORDER — ALPRAZOLAM 0.5 MG PO TABS: 0.2500 mg | ORAL_TABLET | Freq: Three times a day (TID) | ORAL | Status: DC | PRN

## 2022-01-14 MED ORDER — ONDANSETRON HCL 4 MG/2ML IJ SOLN: 4.0000 mg | Freq: Four times a day (QID) | INTRAMUSCULAR | Status: DC | PRN

## 2022-01-14 MED ORDER — BACITRACIN ZINC 500 UNIT/GM EX OINT
TOPICAL_OINTMENT | Freq: Once | CUTANEOUS | Status: AC
  Administered 2022-01-14: 1 via TOPICAL
  Filled 2022-01-14: qty 0.9

## 2022-01-14 MED ORDER — VANCOMYCIN HCL 750 MG/150ML IV SOLN
750.0000 mg | Freq: Once | INTRAVENOUS | Status: AC
  Administered 2022-01-14: 750 mg via INTRAVENOUS
  Filled 2022-01-14: qty 150

## 2022-01-14 NOTE — Assessment & Plan Note (Signed)
Stable -Follow-up with CBC

## 2022-01-14 NOTE — ED Notes (Signed)
Tech walked into pt room, pt very loud and expressive with not wanting to stay here in the ED. Pt is A/Ox3 and does not know the reason she is here in the ED. Pt sts " Dont you touch me, I will sue you" Pt attempts to walk across her room. Pt is very unsteady on her feet. RN expressed to pt that her safety is the utmost importance and that she needs to sit back in the bed and not fall. RN reached out to Dr. Blaine Hamper about pt incident. ED Tech still at pt bedside at this time. Provider to put in orders.

## 2022-01-14 NOTE — ED Notes (Signed)
Pt in bed at this time resting with blankets and call bell. Bed alarm also placed on pt.

## 2022-01-14 NOTE — Assessment & Plan Note (Signed)
-   Crestor 

## 2022-01-14 NOTE — Assessment & Plan Note (Addendum)
CT head negative.  Unclear etiology.   --delirium vs underlying dementia.   Plan: --sitter d/c'ed today --seroquel 25 mg nightly --IV hadol PRN for agitation

## 2022-01-14 NOTE — Assessment & Plan Note (Signed)
This is a chronic issue.  Platelet 86.  No active bleeding -Follow-up with CBC

## 2022-01-14 NOTE — ED Notes (Signed)
Pt continuing to sleep at this time. Pt is arousal but does not wake up long enough to give PO meds.

## 2022-01-14 NOTE — Assessment & Plan Note (Signed)
Initial troponin 11.  Patient has some intermittent chest pain  which is atypical, described as burping. -Continue aspirin, Plavix, Crestor, Coreg

## 2022-01-14 NOTE — Consult Note (Signed)
WOC Nurse Consult Note: Reason for Consult:Chronic, nonhealing wound to left chest. Patient has been agitated in the ED and was recently medicated with 0.'5mg'$  Ativan. She does not awaken for assessment. Seen by this Probation officer in June of this year. Previously, albeit not recently seen in the outpatient [facility] for this lesion, which has not changed in appearance in several years. The patient sometimes wears a dressing but sometimes does not. 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 A silver hydrofiber (Aquacel Ag+, Advantage dressing, [facility] # [id number]), something she has used in the past, is to be applied to the wound bed and topped with dry gauze and secured with silicone bordered foam dressings for atraumatic removal. This can be changed every other day. Dressing may be moistened with NS prior to removal if it becomes adherent. A sacral prophylactic foam dressing is to be placed for PI prevention and heels are to be floated.  [place] nursing team will not follow, but will remain available to this patient, the nursing and medical teams.  Please re-consult if needed.  Thank you for inviting [place] to participate in this patient's Plan of Care.  Maudie Flakes, MSN, RN, CNS, [place], Serita Grammes, [employer], [employer] phone:  [phone]

## 2022-01-14 NOTE — Assessment & Plan Note (Addendum)
--  cong coreg, lasix  --hold Imdur due to hypotension

## 2022-01-14 NOTE — Consult Note (Addendum)
[facility] NOTE       Patient ID: Christine [name] MRN: [id number] DOB/AGE: [birth year] 76 y.o.  Admit [name]: 01/14/2022 Referring Physician Dr. Blaine Hamper Primary Physician Dr. Harrel Lemon Primary Cardiologist Dr. Caryl Comes  Reason for Consultation acute on chronic HF  HPI: Christine [name] is a 76 year old female with history of HFrEF (LVEF 25-30%, G1 DD, LV apical segment akinesis 11/2021) with an abandoned ICD for several years, CAD s/p multiple stents 2006, PAD with recent admission in June 2023 with critical limb ischemia s/p distal abdominal aorta reconstruction, left external iliac artery & left common iliac artery stenting and history of renal artery stent 02/2019, left breast cancer s/p mastectomy with metastases to the lungs with large necrotic left chest wall wound, hypertension, hyperlipidemia, ongoing tobacco use who presents to [facility] ED 01/14/2022 with visual hallucinations, dyspnea on exertion and bilateral lower extremity swelling x1 month. Cardiology is consulted for assistance with her heart failure.  The patient is alert and oriented to self and year and is otherwise pleasantly confused so the history is limited from chart review.  She thinks she is at a school and tells me she is here to spend time with children and other young people. She believes her PCP did her left leg stenting in June.  She tells me both of her legs hurt and have been very swollen.  She denies pain elsewhere.  She was admitted from June 20 to June 22 with progressive left lower extremity swelling and pain at rest.  She underwent reconstruction of her distal aorta and left iliac arteries with Dr. Delana Meyer out of concern for critical left lower leg ischemia, and noted she had a tremendous amount of atherosclerotic disease in further treatment options would be limited from his standpoint.  Per the discharge summary, she able to by the time of discharge and was not sent home with a diuretic. She has not followed  up with her PCP, cardiology, or vascular surgery since discharge on 6/22.  Vitals are notable for a blood pressure of 103/82, heart rate 67 in sinus rhythm with PVCs on telemetry. Her labs are notable for potassium 3.8, BUN/creatinine 13/1.10, GFR 53.  BNP markedly elevated at 2000, high-sensitivity troponin 11.  H&H 11.5/36.2, platelets 86 which is at the low end of her baseline.  Her head CT was negative for acute abnormalities showing chronic atrophy and small vessel ischemic changes.  Chest x-ray without active disease.  Urinalysis with small leukocytes present.  Review of systems limited by patient's mental status   Past Medical History:  Diagnosis [name]   Allergic rhinitis    Anxiety    Breast cancer ([place])    11/26/20 Pt reports wound/lesion on left breast   Chronic systolic heart failure (HCC)    Coronary artery disease    GERD (gastroesophageal reflux disease)    Heart murmur    Hyperlipidemia    Hypertension    Implantable defibrillator [employer]    DOI 2006   MI (myocardial infarction) [facility])    Osteoarthritis    Wears dentures    full upper and lower    Past Surgical History:  Procedure Laterality [name]   CHF exacerbation  10/07   CORONARY STENT PLACEMENT  8/06   3 stents; myocardial infarction   LOWER EXTREMITY ANGIOGRAPHY Left 02/15/2019   Procedure: LOWER EXTREMITY ANGIOGRAPHY;  Surgeon: Katha Cabal, MD;  Location: [place] [facility];  Service: Cardiovascular;  Laterality: Left;   LOWER EXTREMITY ANGIOGRAPHY Left 12/04/2021  Procedure: Lower Extremity Angiography;  Surgeon: Katha Cabal, MD;  Location: [place] [facility];  Service: Cardiovascular;  Laterality: Left;   NM MYOVIEW LTD  1/08   EF 39%, no sx ischemia   PACEMAKER PLACEMENT  2/07   Defibrillator   RENAL ARTERY STENT Left     (Not in a hospital admission)  Social History   Socioeconomic History   Marital status: Divorced    Spouse name: Not on file   Number of children: 1   Years of  education: Not on file   Highest education level: Not on file  Occupational History   Occupation: Artist    Comment: Disabled  Tobacco Use   Smoking status: Every Day    Packs/day: 0.50    Years: 12.00    Total pack years: 6.00    Types: Cigarettes   Smokeless tobacco: Never   Tobacco comments:       Vaping Use   Vaping Use: Never used  Substance and Sexual Activity   Alcohol use: No    Comment: 2-3 times per month   Drug use: No   Sexual activity: Not on file  Other Topics Concern   Not on file  Social History Narrative   Pt is disabled but is hoping to back to work part time.   Has been divorced twice.   Gets regular exercise.   Social Determinants of Health   Financial Resource Strain: Not on file  Food Insecurity: Not on file  Transportation Needs: Not on file  Physical Activity: Not on file  Stress: Not on file  Social Connections: Not on file  Intimate Partner Violence: Not on file    Family History  Problem Relation Age of Onset   Stroke Mother 69   Heart attack Father    Coronary artery disease Father    Heart attack Brother    Coronary artery disease Other    Hypertension Other    Cancer Neg Hx        No breast or colon cancer    PHYSICAL EXAM General: Elderly and cachectic Caucasian female, in no acute distress.  Sitting upright in ED stretcher.  Talking to herself as I enter the room. HEENT:  Normocephalic and atraumatic. Neck:  No JVD.  Chest: Left-sided defibrillator present.  Large ulcerative wound covered with dressing with a satellite nickel sized ulcerative lesion medially Lungs: Normal respiratory effort on room air. Clear bilaterally to auscultation. No wheezes, crackles, rhonchi.  Heart: HRRR . Normal S1 and S2 without gallops or murmurs.  Abdomen: Non-distended appearing.  Msk: Normal strength and tone for age. Extremities: Bilateral lower extremities with 2+ pitting edema, left leg with dusky appearance to her lower shin and  top of her foot with severe pain to light palpation of her left foot.   Neuro: Alert and oriented to self and year but not location or situation. Psych: Pleasantly confused.  Labs:   Lab Results  Component Value [name]   WBC 5.0 01/13/2022   HGB 11.5 (L) 01/13/2022   HCT 36.2 01/13/2022   MCV 91.9 01/13/2022   PLT 86 (L) 01/13/2022    Recent Labs  Lab 01/13/22 2318  NA 140  K 3.8  CL 108  CO2 27  BUN 13  CREATININE 1.10*  CALCIUM 8.4*  PROT 6.1*  BILITOT 0.8  ALKPHOS 49  ALT 12  AST 22  GLUCOSE 123*   Lab Results  Component Value [name]   TROPONINI <0.03 12/20/2014  Lab Results  Component Value [name]   CHOL 105 08/31/2011   CHOL 194 06/04/2009   CHOL 185 11/28/2008   Lab Results  Component Value [name]   HDL 26 (L) 08/31/2011   HDL 38.20 (L) 06/04/2009   HDL 40.90 11/28/2008   Lab Results  Component Value [name]   LDLCALC 58 08/31/2011   LDLCALC 113 (H) 11/28/2008   LDLCALC 116 (H) 02/02/2008   Lab Results  Component Value [name]   TRIG 105 08/31/2011   TRIG 218.0 (H) 06/04/2009   TRIG 158.0 (H) 11/28/2008   Lab Results  Component Value [name]   CHOLHDL 5 06/04/2009   CHOLHDL 5 11/28/2008   CHOLHDL 4.4 CALC 02/02/2008   Lab Results  Component Value [name]   LDLDIRECT 126.0 06/04/2009   LDLDIRECT 118.9 11/16/2006   LDLDIRECT 117.1 08/25/2006      Radiology: DG Chest Portable 1 View  Result [name]: 01/14/2022 CLINICAL DATA:  Shortness of breath on exertion EXAM: PORTABLE CHEST 1 VIEW COMPARISON:  12/03/2021 FINDINGS: Cardiac shadow is enlarged. Defibrillator is again noted. Right chest wall port is again seen. Lungs are clear bilaterally. No focal infiltrate is noted. IMPRESSION: No active disease. Electronically Signed   By: Inez Catalina M.D.   On: 01/14/2022 02:03   CT HEAD WO CONTRAST (5MM)  Result [name]: 01/13/2022 CLINICAL DATA:  Mental status change of unknown cause. EXAM: CT HEAD WITHOUT CONTRAST TECHNIQUE: Contiguous axial images were obtained from  the base of the skull through the vertex without intravenous contrast. RADIATION DOSE REDUCTION: This exam was performed according to the departmental dose-optimization program which includes automated exposure control, adjustment of the mA and/or kV according to patient size and/or use of iterative reconstruction technique. COMPARISON:  11/08/2021 FINDINGS: Brain: Diffuse cerebral atrophy. Ventricular dilatation consistent with central atrophy. Low-attenuation changes in the deep white matter consistent with small vessel ischemia. No abnormal extra-axial fluid collections. No mass effect or midline shift. Gray-white matter junctions are distinct. Basal cisterns are not effaced. No acute intracranial hemorrhage. Basal ganglia calcifications representing normal variation. Old lacunar infarct in the left basal ganglia. Vascular: No hyperdense vessel or unexpected calcification. Skull: Calvarium appears intact. Sinuses/Orbits: Paranasal sinuses and mastoid air cells are clear. Other: None. IMPRESSION: No acute intracranial abnormalities. Chronic atrophy and small vessel ischemic changes. Electronically Signed   By: Lucienne Capers M.D.   On: 01/13/2022 23:58    ECHO 12/04/2021   1. Left ventricular ejection fraction, by estimation, is 25 to 30%. The  left ventricle has severely decreased function. The left ventricle  demonstrates regional wall motion abnormalities (see scoring  diagram/findings for description). There is mild left   ventricular hypertrophy of the basal-septal segment. Left ventricular  diastolic parameters are consistent with Grade I diastolic dysfunction  (impaired relaxation). There is akinesis of the left ventricular, entire  apical segment.   2. Right ventricular systolic function is normal. The right ventricular  size is normal.   3. Left atrial size was severely dilated.   4. A small pericardial effusion is present.   5. The mitral valve is normal in structure. Mild mitral valve   regurgitation.   6. The aortic valve was not well visualized. Aortic valve regurgitation  is trivial.   7. The inferior vena cava is dilated in size with <50% respiratory  variability, suggesting right atrial pressure of 15 mmHg.   FINDINGS   Left Ventricle: Left ventricular ejection fraction, by estimation, is 25  to 30%. The left ventricle has severely decreased  function. The left  ventricle demonstrates regional wall motion abnormalities. Definity  contrast agent was given IV to delineate  the left ventricular endocardial borders. The left ventricular internal  cavity size was normal in size. There is mild left ventricular hypertrophy  of the basal-septal segment. Left ventricular diastolic parameters are  consistent with Grade I diastolic  dysfunction (impaired relaxation).   Right Ventricle: The right ventricular size is normal. No increase in  right ventricular wall thickness. Right ventricular systolic function is  normal.   Left Atrium: Left atrial size was severely dilated.   Right Atrium: Right atrial size was normal in size.   Pericardium: A small pericardial effusion is present.   Mitral Valve: The mitral valve is normal in structure. Mild mitral valve  regurgitation.   Tricuspid Valve: The tricuspid valve is grossly normal. Tricuspid valve  regurgitation is not demonstrated.   Aortic Valve: The aortic valve was not well visualized. Aortic valve  regurgitation is trivial. Aortic valve mean gradient measures 1.0 mmHg.  Aortic valve peak gradient measures 2.5 mmHg.   Pulmonic Valve: The pulmonic valve was not well visualized. Pulmonic valve  regurgitation is not visualized.   Aorta: The aortic root was not well visualized.   Venous: The inferior vena cava is dilated in size with less than 50%  respiratory variability, suggesting right atrial pressure of 15 mmHg.   IAS/Shunts: No atrial level shunt detected by color flow Doppler.   Additional Comments: A  device lead is visualized.         LV Volumes (MOD)  LV vol d, MOD A2C: 68.3 ml Diastology  LV vol d, MOD A4C: 75.5 ml LV e' medial:    3.48 cm/s  LV vol s, MOD A2C: 57.2 ml LV E/e' medial:  11.4  LV vol s, MOD A4C: 61.4 ml LV e' lateral:   3.59 cm/s  LV SV MOD A2C:     11.1 ml LV E/e' lateral: 11.1  LV SV MOD A4C:     75.5 ml  LV SV MOD BP:      13.1 ml   LEFT ATRIUM           Index  LA Vol (A4C): 76.0 ml 55.53 ml/m   AORTIC VALVE  AV Vmax:           79.30 cm/s  AV Vmean:          48.200 cm/s  AV VTI:            0.151 m  AV Peak Grad:      2.5 mmHg  AV Mean Grad:      1.0 mmHg  LVOT Vmax:         48.30 cm/s  LVOT Vmean:        29.800 cm/s  LVOT VTI:          0.083 m  LVOT/AV VTI ratio: 0.55   MITRAL VALVE  MV Area (PHT): 4.89 cm    SHUNTS  MV Decel Time: 155 msec    Systemic VTI: 0.08 m  MV E velocity: 39.80 cm/s  MV A velocity: 50.10 cm/s  MV E/A ratio:  0.79   Kate Sable MD  Electronically signed by Kate Sable MD  Signature [name]/Time: 12/04/2021/1:51:51 PM   TELEMETRY reviewed by me: Sinus rhythm with PVCs  EKG reviewed by me: Sinus rhythm, poor R wave progression rate 62, nonspecific T wave abnormalities  ASSESSMENT AND PLAN:  Christine [name] is a 76 year old female with history of  HFrEF (LVEF 25-30%, G1 DD, LV apical segment akinesis 11/2021) with an abandoned ICD for several years, CAD s/p multiple stents 2006, PAD with recent admission in June 2023 with critical limb ischemia s/p distal abdominal aorta reconstruction, left external iliac artery & left common iliac artery stenting and history of renal artery stent 02/2019, left breast cancer s/p mastectomy with metastases to the lungs with large necrotic left chest wall wound, hypertension, hyperlipidemia, ongoing tobacco use who presents to [facility] ED 01/14/2022 with visual hallucinations, dyspnea on exertion and bilateral lower extremity swelling x1 month. Cardiology is consulted for assistance with her heart  failure.  #Acute encephalopathy #Acute on chronic HFrEF (LVEF 25-30% 11/2021) with abandoned ICD #CAD s/p multiple stents 2006 #PAD s/p admission 11/2021 with critical left lower leg ischemia s/p stenting The patient presents with visual hallucinations, is oriented to self and year and unable to give much of a history other than telling me her left foot hurts.  She was recently admitted at the end of June and underwent extensive stenting with vascular surgery for critical left lower limb ischemia, and appears to have a longstanding history of noncompliance with outpatient cardiology follow-up.  She appears grossly volume overloaded in her lower extremities with a BNP of 2000, in addition to dusky appearance to her left foot and extreme pain to palpation.  It is unclear how long she has been confused, but she is frail appearing and has an open wound to her left chest with a satellite ulcerative lesion medially.  -S/p 40 mg of IV Lasix x2 with excellent urine output, agree with 20 mg IV Lasix twice daily for now -Continue GDMT with carvedilol, decrease dose to 12.5 mg twice daily with her relatively low blood pressure -Continue aspirin and Plavix daily -She was started on Imdur during her admission in April, will recommend restarting this as her blood pressure allows. -Hold Irbesartan with diuresis and relative hypotension -Consider input from vascular surgery regarding her left lower leg -Also consider palliative care input with multiple recent admissions, noncompliance and worsening heart function, and ?untreated breast cancer   This patient's plan of care was discussed and created with Dr. Nehemiah Massed and he is in agreement.  Signed: Tristan Schroeder , PA-C 01/14/2022, 10:33 AM [facility]  The patient has had significant multiple medical problems including peripheral vascular disease coronary artery disease LV systolic dysfunction for which we have personally looked up.  After  review of EKG chest x-ray and lab work it appears that the patient does have some acute encephalopathy severe peripheral vascular disease and acute on chronic systolic dysfunction congestive heart failure.  With some improvements in intravenous Lasix and other guideline medical therapy the patient is relatively stable other than needing diuresis.  No other further intervention will be performed at this time due to her significant multiple issues.  The patient understands risk and benefits of her current condition.  The patient has been interviewed and examined. I agree with assessment and plan above. Christine Royals MD [facility]

## 2022-01-14 NOTE — Assessment & Plan Note (Addendum)
--  supplements per dietician

## 2022-01-14 NOTE — ED Notes (Signed)
Pt proivded with warm blankets at this time, denies further needs.

## 2022-01-14 NOTE — Progress Notes (Signed)
Anticoagulation monitoring(Lovenox):  76 yo female ordered Lovenox 40 mg Q24h    Filed Weights   01/13/22 2313  Weight: 36.3 kg (80 lb)   BMI 14.17   Lab Results  Component Value Date   CREATININE 1.10 (H) 01/13/2022   CREATININE 0.95 12/05/2021   CREATININE 1.01 (H) 12/04/2021   Estimated Creatinine Clearance: 25.7 mL/min (A) (by C-G formula based on SCr of 1.1 mg/dL (H)). Hemoglobin & Hematocrit     Component Value Date/Time   HGB 11.5 (L) 01/13/2022 2318   HGB 11.8 (L) 04/03/2014 1336   HCT 36.2 01/13/2022 2318   HCT 36.9 04/03/2014 1336     Per Protocol for Patient with estCrcl < 30 ml/min and BMI < 40, will transition to Lovenox 30 mg Q24h.

## 2022-01-14 NOTE — Assessment & Plan Note (Addendum)
--  hold benzo due to mental status

## 2022-01-14 NOTE — Assessment & Plan Note (Signed)
-   Continue tamoxifen 

## 2022-01-14 NOTE — Assessment & Plan Note (Signed)
-   Aspirin, Plavix, Crestor

## 2022-01-14 NOTE — Assessment & Plan Note (Addendum)
--  wound care consulted, wound has not changed in appearance.   --wound cx obtained on presentation pos for enterobacter and klebsiella --started on ceftriaxone on presentation, transitioned to Cipro, per ID pharm rec (since enterobacter can become resistant to po b-lactams), completed 8-day course.

## 2022-01-14 NOTE — Assessment & Plan Note (Addendum)
Patient has shortness of breath, and crackles on auscultation, positive JVD, elevated BNP 2034, clinically consistent with CHF exacerbation.  2D echo on 12/04/2021 showed EF 25-30% with grade 1 diastolic dysfunction.  Consulted Dr. Nehemiah Massed of cardiology. --received IV lasix 40 mg x2 and IV lasix 20 mg x2 Plan: --cont oral lasix 20 mg daily  --cont coreg at reduced 3.125 mg BID due to low BP -Hold Irbesartan due to hypotension

## 2022-01-14 NOTE — H&P (Addendum)
History and Physical    Christine [name]:[id number] DOB: 1946-03-26 DOA: 01/14/2022  Referring MD/NP/PA:   PCP: Baxter Hire, MD   Patient coming from:  The patient is coming from home.     Chief Complaint: SOB and AMS  HPI: Christine [name] is a 76 y.o. female with medical history significant of CHF with EF 25-30, hypertension, hyperlipidemia, COPD, anxiety, CAD, stent placement, ICD placement, breast cancer with chronic left chest wound, AAA, thrombocytopenia, cognitive decline, PVD and recent admission for critical left lower extremity ischemia (s/p of reconstruction byb VVS), CKD-3a, who presents with shortness breath and altered mental status.  Patient has AMS and history is limited.  I have tried to call her son without success.  I also tried to call her niece who did not pick up the phone. Pt is confused with hallucination.  She cannot answer some questions but not focused, cannot give detailed information.  She states she lives in an apartment alone.  She states tonight she started seeing people from another planet coming out of her ceiling.She went to tell her neighbor who called 911. She states that she has shortness of breath, particularly exertion.  She also has a bilateral lower leg edema.  Denies cough, fever or chills.  She also states that she has some central chest pain.  Denies symptoms of UTI.  She moves all extremities.  She is intermittently confused, knows her own name, knows that she is in hospital, confused about year 2023  Data reviewed independently and ED Course: pt was found to have BNP 2034, troponin level 11, lactic acid 1.8, WBC 5.0, positive urinalysis (hazy appearance, small amount of leukocyte, rare bacteria, WBC 21-50), Tylenol level less than 10, salicylate level less than 7, stable renal function, temperature normal, blood pressure 148/93, heart rate 59, 71, RR 21, oxygen saturation 99% on room air.  Chest x-ray negative.  CT head negative.  Patient is admitted  to telemetry bed as inpatient.  Dr. Nehemiah Massed of card is consulted.   EKG: I have personally reviewed.  Sinus rhythm, QTc 674, heart rate 57, inferior Q-wave pattern, poor R wave progression, low voltage in lead I.   Review of Systems: Cannot be reviewed accurately due to altered mental status.    Allergy:  Allergies  Allergen Reactions   Atorvastatin Other (See Comments)    increased LFT's   Lisinopril Cough   Oxycodone Hcl Itching and Other (See Comments)    Restless & jittery   Simvastatin Other (See Comments)    myalgias    Past Medical History:  Diagnosis Date   Allergic rhinitis    Anxiety    Breast cancer ([place])    11/26/20 Pt reports wound/lesion on left breast   Chronic systolic heart failure (HCC)    Coronary artery disease    GERD (gastroesophageal reflux disease)    Heart murmur    Hyperlipidemia    Hypertension    Implantable defibrillator [employer]    DOI 2006   MI (myocardial infarction) ([place])    Osteoarthritis    Wears dentures    full upper and lower    Past Surgical History:  Procedure Laterality Date   CHF exacerbation  10/07   CORONARY STENT PLACEMENT  8/06   3 stents; myocardial infarction   LOWER EXTREMITY ANGIOGRAPHY Left 02/15/2019   Procedure: LOWER EXTREMITY ANGIOGRAPHY;  Surgeon: Katha Cabal, MD;  Location: [facility];  Service: Cardiovascular;  Laterality: Left;   LOWER  EXTREMITY ANGIOGRAPHY Left 12/04/2021   Procedure: Lower Extremity Angiography;  Surgeon: Katha Cabal, MD;  Location: [facility];  Service: Cardiovascular;  Laterality: Left;   NM MYOVIEW LTD  1/08   EF 39%, no sx ischemia   PACEMAKER PLACEMENT  2/07   Defibrillator   RENAL ARTERY STENT Left     Social History:  reports that she has been smoking cigarettes. She has a 6.00 pack-year smoking history. She has never used smokeless tobacco. She reports that she does not drink alcohol and does not use drugs.  Family History:  Family History   Problem Relation Age of Onset   Stroke Mother 42   Heart attack Father    Coronary artery disease Father    Heart attack Brother    Coronary artery disease Other    Hypertension Other    Cancer Neg Hx        No breast or colon cancer     Prior to Admission medications   Medication Sig Start Date End Date Taking? Authorizing Provider  aspirin EC 81 MG tablet Take 81 mg by mouth daily.   Yes [provider]  carvedilol (COREG) 25 MG tablet TAKE 1 TABLET BY MOUTH TWICE A DAY Patient taking differently: Take 25 mg by mouth 2 (two) times daily with a meal. 09/09/10  Yes Viviana Simpler I, MD  clopidogrel (PLAVIX) 75 MG tablet TAKE 1 TABLET BY MOUTH ONCE DAILY Patient taking differently: Take 75 mg by mouth daily. 12/05/19  Yes Schnier, Dolores Lory, MD  hydrOXYzine (ATARAX) 25 MG tablet Take 25 mg by mouth 3 (three) times daily. 12/26/21  Yes [provider]  pantoprazole (PROTONIX) 40 MG tablet Take 40 mg by mouth daily.  02/25/17  Yes [provider]  rosuvastatin (CRESTOR) 10 MG tablet Take 10 mg by mouth daily.    Yes [provider]  tamoxifen (NOLVADEX) 20 MG tablet Take 1 tablet (20 mg total) by mouth daily. 12/29/18  Yes Lloyd Huger, MD  traMADol (ULTRAM) 50 MG tablet Take 25 mg by mouth 2 (two) times daily.   Yes [provider]  valsartan (DIOVAN) 320 MG tablet Take 320 mg by mouth daily at 12 noon.    Yes [provider]  bisacodyl (DULCOLAX) 5 MG EC tablet Take 5 mg by mouth daily as needed for mild constipation or moderate constipation.    [provider]  isosorbide mononitrate (IMDUR) 30 MG 24 hr tablet Take 1 tablet (30 mg total) by mouth daily. Patient not taking: Reported on 01/14/2022 10/04/21 01/14/22  Wyvonnia Dusky, MD  LORazepam (ATIVAN) 0.5 MG tablet Take 0.5 tablets (0.25 mg total) by mouth 2 (two) times daily as needed for anxiety. 12/05/21   Emeterio Reeve, DO  Polyethylene Glycol 3350 (PEG 3350)  POWD Take 17 g by mouth daily as needed (constipation.).     [provider]    Physical Exam: Vitals:   01/14/22 1530 01/14/22 1600 01/14/22 1605 01/14/22 1816  BP: (!) 155/90 (!) 166/96  (!) 168/92  Pulse: 60 64  63  Resp:  18  18  Temp:   98.5 F (36.9 C) 98.6 F (37 C)  TempSrc:   Oral Oral  SpO2: 99% 100%  100%  Weight:      Height:       General: Not in acute distress.  Cachectic looking. HEENT:       Eyes: PERRL, EOMI, no scleral icterus.  ENT: No discharge from the ears and nose, no pharynx injection, no tonsillar enlargement.        Neck: positive JVD, no bruit, no mass felt. Heme: No neck lymph node enlargement. Cardiac: S1/S2, RRR, No murmurs, No gallops or rubs. Respiratory: Has crackles bilaterally GI: Soft, nondistended, nontender, no organomegaly, BS present. GU: No hematuria Ext:  1+ pitting leg edema bilaterally. 1+DP/PT pulse bilaterally. Musculoskeletal: No joint deformities, No joint redness or warmth, no limitation of ROM in spin. Skin: Has a chronic open wound in left chest    Neuro: Alert, oriented X3, cranial nerves II-XII grossly intact, moves all extremities normally. Psych: no suicidal or hemocidal ideation.  Has hallucination  Labs on Admission: I have personally reviewed following labs and imaging studies  CBC: Recent Labs  Lab 01/13/22 2318  WBC 5.0  HGB 11.5*  HCT 36.2  MCV 91.9  PLT 86*   Basic Metabolic Panel: Recent Labs  Lab 01/13/22 2318  NA 140  K 3.8  CL 108  CO2 27  GLUCOSE 123*  BUN 13  CREATININE 1.10*  CALCIUM 8.4*   GFR: Estimated Creatinine Clearance: 25.7 mL/min (A) (by C-G formula based on SCr of 1.1 mg/dL (H)). Liver Function Tests: Recent Labs  Lab 01/13/22 2318  AST 22  ALT 12  ALKPHOS 49  BILITOT 0.8  PROT 6.1*  ALBUMIN 3.4*   No results for input(s): "LIPASE", "AMYLASE" in the last 168 hours. No results for input(s): "AMMONIA" in the last 168 hours. Coagulation Profile: No  results for input(s): "INR", "PROTIME" in the last 168 hours. Cardiac Enzymes: No results for input(s): "CKTOTAL", "CKMB", "CKMBINDEX", "TROPONINI" in the last 168 hours. BNP (last 3 results) No results for input(s): "PROBNP" in the last 8760 hours. HbA1C: No results for input(s): "HGBA1C" in the last 72 hours. CBG: No results for input(s): "GLUCAP" in the last 168 hours. Lipid Profile: No results for input(s): "CHOL", "HDL", "LDLCALC", "TRIG", "CHOLHDL", "LDLDIRECT" in the last 72 hours. Thyroid Function Tests: No results for input(s): "TSH", "T4TOTAL", "FREET4", "T3FREE", "THYROIDAB" in the last 72 hours. Anemia Panel: No results for input(s): "VITAMINB12", "FOLATE", "FERRITIN", "TIBC", "IRON", "RETICCTPCT" in the last 72 hours. Urine analysis:    Component Value Date/Time   COLORURINE YELLOW (A) 01/13/2022 2318   APPEARANCEUR HAZY (A) 01/13/2022 2318   LABSPEC 1.010 01/13/2022 2318   PHURINE 6.0 01/13/2022 2318   GLUCOSEU NEGATIVE 01/13/2022 2318   HGBUR MODERATE (A) 01/13/2022 2318   BILIRUBINUR NEGATIVE 01/13/2022 [place] 01/13/2022 2318   PROTEINUR 30 (A) 01/13/2022 2318   NITRITE NEGATIVE 01/13/2022 2318   LEUKOCYTESUR SMALL (A) 01/13/2022 2318   Sepsis Labs: '@LABRCNTIP'$ (procalcitonin:4,lacticidven:4) ) Recent Results (from the past 240 hour(s))  Blood culture (routine x 2)     Status: None (Preliminary result)   Collection Time: 01/14/22  2:20 AM   Specimen: BLOOD  Result Value Ref Range Status   Specimen Description BLOOD RIGHT ARM  Final   Special Requests   Final    BOTTLES DRAWN AEROBIC AND ANAEROBIC Blood Culture adequate volume   Culture   Final    NO GROWTH < 12 HOURS Performed at [facility], [address], [place], [place] [address]    Report Status PENDING  Incomplete  Aerobic Culture w Gram Stain (superficial specimen)     Status: None (Preliminary result)   Collection Time: 01/14/22  2:28 AM   Specimen: Abscess  Result  Value Ref Range Status   Specimen Description  Final    ABSCESS LEFT UPPER CHEST Performed at [facility], [address] [place], [place] [address]    Special Requests   Final    NONE Performed at [facility], [place], [place] [address]    Gram Stain   Final    NO SQUAMOUS EPITHELIAL CELLS SEEN RARE WBC SEEN FEW GRAM POSITIVE RODS FEW GRAM POSITIVE COCCI FEW GRAM NEGATIVE RODS Performed at [facility], [place] [address], [place], [place] [address]    Culture PENDING  Incomplete   Report Status PENDING  Incomplete  Blood culture (routine x 2)     Status: None (Preliminary result)   Collection Time: 01/14/22  2:29 AM   Specimen: BLOOD  Result Value Ref Range Status   Specimen Description BLOOD RIGHT FA  Final   Special Requests   Final    BOTTLES DRAWN AEROBIC AND ANAEROBIC Blood Culture results may not be optimal due to an inadequate volume of blood received in culture bottles   Culture   Final    NO GROWTH < 12 HOURS Performed at [facility], [address], [place], [place] [address]    Report Status PENDING  Incomplete     Radiological Exams on Admission: DG Chest Portable 1 View  Result Date: 01/14/2022 CLINICAL DATA:  Shortness of breath on exertion EXAM: PORTABLE CHEST 1 VIEW COMPARISON:  12/03/2021 FINDINGS: Cardiac shadow is enlarged. Defibrillator is again noted. Right chest wall port is again seen. Lungs are clear bilaterally. No focal infiltrate is noted. IMPRESSION: No active disease. Electronically Signed   By: Inez Catalina M.D.   On: 01/14/2022 02:03   CT HEAD WO CONTRAST (5MM)  Result Date: 01/13/2022 CLINICAL DATA:  Mental status change of unknown cause. EXAM: CT HEAD WITHOUT CONTRAST TECHNIQUE: Contiguous axial images were obtained from the base of the skull through the vertex without intravenous contrast. RADIATION DOSE REDUCTION: This exam was performed according to the departmental dose-optimization  program which includes automated exposure control, adjustment of the mA and/or kV according to patient size and/or use of iterative reconstruction technique. COMPARISON:  11/08/2021 FINDINGS: Brain: Diffuse cerebral atrophy. Ventricular dilatation consistent with central atrophy. Low-attenuation changes in the deep white matter consistent with small vessel ischemia. No abnormal extra-axial fluid collections. No mass effect or midline shift. Gray-white matter junctions are distinct. Basal cisterns are not effaced. No acute intracranial hemorrhage. Basal ganglia calcifications representing normal variation. Old lacunar infarct in the left basal ganglia. Vascular: No hyperdense vessel or unexpected calcification. Skull: Calvarium appears intact. Sinuses/Orbits: Paranasal sinuses and mastoid air cells are clear. Other: None. IMPRESSION: No acute intracranial abnormalities. Chronic atrophy and small vessel ischemic changes. Electronically Signed   By: Lucienne Capers M.D.   On: 01/13/2022 23:58      Assessment/Plan Principal Problem:   Acute on chronic combined systolic and diastolic CHF (congestive heart failure) (HCC) Active Problems:   CAD (coronary artery disease)   UTI (urinary tract infection)   Acute metabolic encephalopathy   Chronic obstructive pulmonary disease (HCC)   Essential hypertension   HLD (hyperlipidemia)   Thrombocytopenia (HCC)   Anxiety   PAD (peripheral artery disease) (HCC)   Protein-calorie malnutrition, severe (HCC)   Open wound_left chest wall, chronic   History of breast cancer   Chronic kidney disease, stage 3a (HCC)   Assessment and Plan: * Acute on chronic combined systolic and diastolic CHF (congestive heart failure) ([place]) Patient has shortness of breath, and crackles on  auscultation, positive JVD, elevated BNP 2034, clinically consistent with CHF exacerbation.  2D echo on 12/04/2021 showed EF 25-30% with grade 1 diastolic dysfunction.  Consulted Dr. Nehemiah Massed of  cardiology.  -Will admit to tele as inpatient -Lasix 20 mg bid by IV (patient was given 40 mg of Lasix in ED) -Daily weights -strict I/O's -Low salt diet -Fluid restriction -Obtain REDs Vest reading    CAD (coronary artery disease) Initial troponin 11.  Patient has some intermittent chest pain  which is atypical, described as burping. -Continue aspirin, Plavix, Crestor, Coreg  UTI (urinary tract infection) Patient is not septic - IV Rocephin -Follow-up urine culture  Acute metabolic encephalopathy Likely due to UTI.  CT head negative. -Frequent neurochecks -Treat UTI as above  Chronic obstructive pulmonary disease (HCC) - Bronchodilators  Essential hypertension - IV hydralazine as needed -Coreg, irbesartan  HLD (hyperlipidemia) - Crestor  Thrombocytopenia ([place]) This is a chronic issue.  Platelet 86.  No active bleeding -Follow-up with CBC  Anxiety - Continue home as needed Ativan  PAD (peripheral artery disease) (HCC) - Aspirin, Plavix, Crestor  Protein-calorie malnutrition, severe ([place]) - Consult to nutrition -Start Ensure   Open wound_left chest wall, chronic - Wound care consult  History of breast cancer - Continue tamoxifen  Chronic kidney disease, stage 3a (HCC) Stable -Follow-up with CBC          DVT ppx: SCD  Code Status: DNR per her niece.  Patient is extremely fragile, will not be able to tolerate CPR.  I spoke with her niece, who recommended DNR.  Her niece will talk to patient's son for more discussion.   Family Communication: I have tried to call his son who did not pick up the phone, could not leave message. I spoke with her niece by phone.  Disposition Plan:  to be determined, will do PT/OT and consult TOC  Consults called: Dr. Nehemiah Massed of card  Admission status and Level of care: Telemetry Cardiac:  as inpt          Severity of Illness:  The appropriate patient status for this patient is INPATIENT. Inpatient status  is judged to be reasonable and necessary in order to provide the required intensity of service to ensure the patient's safety. The patient's presenting symptoms, physical exam findings, and initial radiographic and laboratory data in the context of their chronic comorbidities is felt to place them at high risk for further clinical deterioration. Furthermore, it is not anticipated that the patient will be medically stable for discharge from the hospital within 2 midnights of admission.   * I certify that at the point of admission it is my clinical judgment that the patient will require inpatient hospital care spanning beyond 2 midnights from the point of admission due to high intensity of service, high risk for further deterioration and high frequency of surveillance required.*       Date of Service 01/14/2022    Ivor Costa [facility]   If 7PM-7AM, please contact night-coverage [email or web address] 01/14/2022, 6:49 PM

## 2022-01-14 NOTE — ED Provider Notes (Signed)
[facility] Provider Note    Event Date/Time   First MD Initiated Contact with Patient 01/14/22 0115     (approximate)   History   Hallucinations   HPI  Christine [name] is a 76 y.o. female with history of hypertension, hyperlipidemia, CAD, CHF, breast cancer, CHF status post defibrillator, peripheral arterial disease with recent admission in June for critical left lower extremity ischemia who presents to the emergency department with altered mental status and hallucinations.  She states she lives in an apartment alone.  She states tonight she started seeing people from another planet coming out of her ceiling.  She went to tell her neighbor who called 911.  Patient has been intermittently confused here.  She states she has had intermittent chest pain, shortness of breath with exertion and has had bilateral lower extremity swelling she states since her last admission in June.  She denies fevers, cough, vomiting or diarrhea.  She has a large wound to the left chest wall and is unable to tell me how long that has been present or when the dressing was last changed.  She has no psychiatric illness and no previous history of dementia.  She denies illicit drug use or alcohol use.  She states she has never had hallucinations before.  Denies any medication changes.  States she does take tramadol but has been taking this for years.   History provided by patient and EMS.    Past Medical History:  Diagnosis Date   Allergic rhinitis    Anxiety    Breast cancer ([place])    11/26/20 Pt reports wound/lesion on left breast   Chronic systolic heart failure (HCC)    Coronary artery disease    GERD (gastroesophageal reflux disease)    Heart murmur    Hyperlipidemia    Hypertension    Implantable defibrillator [employer]    DOI 2006   MI (myocardial infarction) [facility])    Osteoarthritis    Wears dentures    full upper and lower    Past Surgical History:  Procedure Laterality Date    CHF exacerbation  10/07   CORONARY STENT PLACEMENT  8/06   3 stents; myocardial infarction   LOWER EXTREMITY ANGIOGRAPHY Left 02/15/2019   Procedure: LOWER EXTREMITY ANGIOGRAPHY;  Surgeon: Katha Cabal, MD;  Location: [place] [facility];  Service: Cardiovascular;  Laterality: Left;   LOWER EXTREMITY ANGIOGRAPHY Left 12/04/2021   Procedure: Lower Extremity Angiography;  Surgeon: Katha Cabal, MD;  Location: [place] [facility];  Service: Cardiovascular;  Laterality: Left;   NM MYOVIEW LTD  1/08   EF 39%, no sx ischemia   PACEMAKER PLACEMENT  2/07   Defibrillator   RENAL ARTERY STENT Left     MEDICATIONS:  Prior to Admission medications   Medication Sig Start Date End Date Taking? Authorizing Provider  aspirin EC 81 MG tablet Take 81 mg by mouth daily.   Yes [provider]  carvedilol (COREG) 25 MG tablet TAKE 1 TABLET BY MOUTH TWICE A DAY Patient taking differently: Take 25 mg by mouth 2 (two) times daily with a meal. 09/09/10  Yes Viviana Simpler I, MD  clopidogrel (PLAVIX) 75 MG tablet TAKE 1 TABLET BY MOUTH ONCE DAILY Patient taking differently: Take 75 mg by mouth daily. 12/05/19  Yes Schnier, Dolores Lory, MD  hydrOXYzine (ATARAX) 25 MG tablet Take 25 mg by mouth 3 (three) times daily. 12/26/21  Yes [provider]  pantoprazole (PROTONIX) 40 MG  tablet Take 40 mg by mouth daily.  02/25/17  Yes [provider]  rosuvastatin (CRESTOR) 10 MG tablet Take 10 mg by mouth daily.    Yes [provider]  tamoxifen (NOLVADEX) 20 MG tablet Take 1 tablet (20 mg total) by mouth daily. 12/29/18  Yes Lloyd Huger, MD  traMADol (ULTRAM) 50 MG tablet Take 25 mg by mouth 2 (two) times daily.   Yes [provider]  valsartan (DIOVAN) 320 MG tablet Take 320 mg by mouth daily at 12 noon.    Yes [provider]  bisacodyl (DULCOLAX) 5 MG EC tablet Take 5 mg by mouth daily as needed for mild constipation or moderate constipation.     [provider]  isosorbide mononitrate (IMDUR) 30 MG 24 hr tablet Take 1 tablet (30 mg total) by mouth daily. Patient not taking: Reported on 01/14/2022 10/04/21 01/14/22  Wyvonnia Dusky, MD  LORazepam (ATIVAN) 0.5 MG tablet Take 0.5 tablets (0.25 mg total) by mouth 2 (two) times daily as needed for anxiety. 12/05/21   Emeterio Reeve, DO  Polyethylene Glycol 3350 (PEG 3350) POWD Take 17 g by mouth daily as needed (constipation.).     [provider]    Physical Exam   Triage Vital Signs: ED Triage Vitals  Enc Vitals Group     BP 01/13/22 2311 (!) 135/58     Pulse Rate 01/13/22 2311 62     Resp 01/13/22 2311 14     Temp 01/13/22 2311 98.3 F (36.8 C)     Temp Source 01/13/22 2311 Oral     SpO2 01/13/22 2311 98 %     Weight 01/13/22 2313 80 lb (36.3 kg)     Height 01/13/22 2313 '5\' 3"'$  (1.6 m)     Head Circumference --      Peak Flow --      Pain Score 01/13/22 2313 5     Pain Loc --      Pain Edu? --      Excl. in [place]? --     Most recent vital signs: Vitals:   01/14/22 0630 01/14/22 0700  BP: (!) 167/89 (!) 168/83  Pulse: (!) 59 62  Resp:  16  Temp:    SpO2: 99% 100%    CONSTITUTIONAL: Alert and oriented x 4 but intermittently confused.  Thin.  Elderly. HEAD: Normocephalic, atraumatic EYES: Conjunctivae clear, pupils appear equal, sclera nonicteric ENT: normal nose; moist mucous membranes NECK: Supple, normal ROM CARD: RRR; S1 and S2 appreciated; no murmurs, no clicks, no rubs, no gallops CHEST: Patient has a large erythematous wound just underneath her pacemaker to left chest wall.  It appears she has had a previous mastectomy.  There is no bleeding or purulent drainage.  No surrounding erythema, warmth or induration. RESP: Normal chest excursion without splinting or tachypnea; breath sounds clear and equal bilaterally; no wheezes, no rhonchi, no rales, no hypoxia or respiratory distress, speaking full sentences ABD/GI: Normal bowel sounds;  non-distended; soft, non-tender, no rebound, no guarding, no peritoneal signs BACK: The back appears normal EXT: Normal ROM in all joints; no deformity noted, patient has 2+ pitting edema in bilateral lower extremities that appears symmetric.  She is quite tender to palpation in both of her legs.  Her legs feel warm and well-perfused.  She has 2+ dopplerable DP pulses bilaterally and monophasic dopplerable PT pulses bilaterally. SKIN: Normal color for age and race; warm; no rash on exposed skin NEURO: Moves all extremities equally, normal  speech, no facial asymmetry PSYCH: Denies SI or HI.  Does not appear to be responding to internal stimuli.     Patient gave verbal permission to utilize photo for medical documentation only. The image was not stored on any personal device.  ED Results / Procedures / Treatments   LABS: (all labs ordered are listed, but only abnormal results are displayed) Labs Reviewed  COMPREHENSIVE METABOLIC PANEL - Abnormal; Notable for the following components:      Result Value   Glucose, Bld 123 (*)    Creatinine, Ser 1.10 (*)    Calcium 8.4 (*)    Total Protein 6.1 (*)    Albumin 3.4 (*)    GFR, Estimated 53 (*)    All other components within normal limits  CBC - Abnormal; Notable for the following components:   Hemoglobin 11.5 (*)    Platelets 86 (*)    All other components within normal limits  SALICYLATE LEVEL - Abnormal; Notable for the following components:   Salicylate Lvl <5.0 (*)    All other components within normal limits  ACETAMINOPHEN LEVEL - Abnormal; Notable for the following components:   Acetaminophen (Tylenol), Serum <10 (*)    All other components within normal limits  URINALYSIS, COMPLETE (UACMP) WITH MICROSCOPIC - Abnormal; Notable for the following components:   Color, Urine YELLOW (*)    APPearance HAZY (*)    Hgb urine dipstick MODERATE (*)    Protein, ur 30 (*)    Leukocytes,Ua SMALL (*)    Bacteria, UA RARE (*)    All other  components within normal limits  BRAIN NATRIURETIC PEPTIDE - Abnormal; Notable for the following components:   B Natriuretic Peptide 2,034.9 (*)    All other components within normal limits  AEROBIC CULTURE W GRAM STAIN (SUPERFICIAL SPECIMEN)  URINE CULTURE  CULTURE, BLOOD (ROUTINE X 2)  CULTURE, BLOOD (ROUTINE X 2)  ETHANOL  URINE DRUG SCREEN, QUALITATIVE ([facility] ONLY)  LACTIC ACID, PLASMA  TROPONIN I (HIGH SENSITIVITY)     EKG   Date: 01/14/2022   Rate: 62  Rhythm: normal sinus rhythm  QRS Axis: normal  Intervals: normal  ST/T Wave abnormalities: normal  Conduction Disutrbances: none  Narrative Interpretation: Nonspecific ST changes diffusely with anterior and inferior Q waves that are old    RADIOLOGY: My personal review and interpretation of imaging: Chest x-ray shows vascular congestion.  Head CT shows no acute abnormality.  I have personally reviewed all radiology reports.   DG Chest Portable 1 View  Result Date: 01/14/2022 CLINICAL DATA:  Shortness of breath on exertion EXAM: PORTABLE CHEST 1 VIEW COMPARISON:  12/03/2021 FINDINGS: Cardiac shadow is enlarged. Defibrillator is again noted. Right chest wall port is again seen. Lungs are clear bilaterally. No focal infiltrate is noted. IMPRESSION: No active disease. Electronically Signed   By: Inez Catalina M.D.   On: 01/14/2022 02:03   CT HEAD WO CONTRAST (5MM)  Result Date: 01/13/2022 CLINICAL DATA:  Mental status change of unknown cause. EXAM: CT HEAD WITHOUT CONTRAST TECHNIQUE: Contiguous axial images were obtained from the base of the skull through the vertex without intravenous contrast. RADIATION DOSE REDUCTION: This exam was performed according to the departmental dose-optimization program which includes automated exposure control, adjustment of the mA and/or kV according to patient size and/or use of iterative reconstruction technique. COMPARISON:  11/08/2021 FINDINGS: Brain: Diffuse cerebral atrophy. Ventricular  dilatation consistent with central atrophy. Low-attenuation changes in the deep white matter consistent with small vessel ischemia. No abnormal  extra-axial fluid collections. No mass effect or midline shift. Gray-white matter junctions are distinct. Basal cisterns are not effaced. No acute intracranial hemorrhage. Basal ganglia calcifications representing normal variation. Old lacunar infarct in the left basal ganglia. Vascular: No hyperdense vessel or unexpected calcification. Skull: Calvarium appears intact. Sinuses/Orbits: Paranasal sinuses and mastoid air cells are clear. Other: None. IMPRESSION: No acute intracranial abnormalities. Chronic atrophy and small vessel ischemic changes. Electronically Signed   By: Lucienne Capers M.D.   On: 01/13/2022 23:58     PROCEDURES:  Critical Care performed: No   .1-3 Lead EKG Interpretation  Performed by: Zackari Ruane, Delice Bison, DO Authorized by: Tabitha Tupper, Delice Bison, DO     Interpretation: normal     ECG rate:  62   ECG rate assessment: normal     Rhythm: sinus rhythm     Ectopy: none     Conduction: normal       IMPRESSION / MDM / ASSESSMENT AND PLAN / ED COURSE  I reviewed the triage vital signs and the nursing notes.    Patient here with altered mental status and hallucinations.  Has a large left-sided chest wound with unknown chronicity.  The patient is on the cardiac monitor to evaluate for evidence of arrhythmia and/or significant heart rate changes.   DIFFERENTIAL DIAGNOSIS (includes but not limited to):   Psychosis, UTI, bacteremia, cellulitis, sepsis, CVA, intracranial hemorrhage, dementia   Patient's presentation is most consistent with acute presentation with potential threat to life or bodily function.   PLAN: We will obtain CBC, CMP, ethanol level, urine drug screen, urinalysis, troponin, EKG, chest x-ray.  We will interrogate her pacemaker.  We will give Rocephin and vancomycin.   MEDICATIONS GIVEN IN ED: Medications  aspirin EC  tablet 81 mg (has no administration in time range)  traMADol (ULTRAM) tablet 25 mg (has no administration in time range)  tamoxifen (NOLVADEX) tablet 20 mg (has no administration in time range)  carvedilol (COREG) tablet 25 mg (has no administration in time range)  rosuvastatin (CRESTOR) tablet 10 mg (has no administration in time range)  irbesartan (AVAPRO) tablet 300 mg (has no administration in time range)  hydrOXYzine (ATARAX) tablet 25 mg (has no administration in time range)  LORazepam (ATIVAN) tablet 0.25 mg (has no administration in time range)  bisacodyl (DULCOLAX) EC tablet 5 mg (has no administration in time range)  pantoprazole (PROTONIX) EC tablet 40 mg (has no administration in time range)  polyethylene glycol (MIRALAX / GLYCOLAX) packet 17 g (has no administration in time range)  clopidogrel (PLAVIX) tablet 75 mg (has no administration in time range)  enoxaparin (LOVENOX) injection 30 mg (has no administration in time range)  furosemide (LASIX) injection 40 mg (has no administration in time range)  acetaminophen (TYLENOL) tablet 650 mg (has no administration in time range)    Or  acetaminophen (TYLENOL) suppository 650 mg (has no administration in time range)  traZODone (DESYREL) tablet 25 mg (has no administration in time range)  magnesium hydroxide (MILK OF MAGNESIA) suspension 30 mL (has no administration in time range)  ondansetron (ZOFRAN) tablet 4 mg (has no administration in time range)    Or  ondansetron (ZOFRAN) injection 4 mg (has no administration in time range)  cefTRIAXone (ROCEPHIN) 1 g in sodium chloride 0.9 % 100 mL IVPB (has no administration in time range)  furosemide (LASIX) injection 40 mg (40 mg Intravenous Given 01/14/22 0254)  cefTRIAXone (ROCEPHIN) 2 g in sodium chloride 0.9 % 100 mL IVPB (0 g Intravenous  Stopped 01/14/22 0333)  bacitracin ointment (1 Application Topical Given 01/14/22 0301)  vancomycin (VANCOREADY) IVPB 750 mg/150 mL (0 mg Intravenous  Stopped 01/14/22 0439)     ED COURSE: Patient's labs show no leukocytosis.  She has thrombocytopenia which is chronic.  Normal electrolytes.  Wound culture pending, blood cultures pending.  Lactic is normal.  Tylenol salicylate levels and ethanol level negative.  Urine does appear infected with small leukocyte esterase, red blood cells, white blood cells and rare bacteria with no squamous cells.  She has already received Rocephin.  Culture pending.  Troponin is negative but BNP is 2000.  Chest x-ray reviewed and interpreted by myself and the radiologist and shows no edema but given her peripheral edema, I am concerned she is volume overloaded.  Will give Lasix.  CT head reviewed and interpreted by myself and the radiologist and shows no acute abnormality.  Will discuss with hospitalist for admission for altered mental status, hallucinations in the setting of a left chest wall wound with unknown chronicity and UTI and CHF exacerbation.   CONSULTS:  Consulted and discussed patient's case with hospitalist, Dr. Sidney Ace.  I have recommended admission and consulting physician agrees and will place admission orders.  Patient (and family if present) agree with this plan.   I reviewed all nursing notes, vitals, pertinent previous records.  All labs, EKGs, imaging ordered have been independently reviewed and interpreted by myself.    OUTSIDE RECORDS REVIEWED: Reviewed patient's recent hospitalization in June 2023 and primary care doctor note on 11/19/2021 with Dr. Edwina Barth.       FINAL CLINICAL IMPRESSION(S) / ED DIAGNOSES   Final diagnoses:  Altered mental status, unspecified altered mental status type  Acute UTI  Open chest wound, left, initial encounter  Acute on chronic congestive heart failure, unspecified heart failure type ([place])     Rx / DC Orders   ED Discharge Orders     None        Note:  This document was prepared using Dragon voice recognition software and may include  unintentional dictation errors.   Camreigh Michie, Delice Bison, DO 01/14/22 [phone]

## 2022-01-14 NOTE — Progress Notes (Signed)
PHARMACY -  BRIEF ANTIBIOTIC NOTE   Pharmacy has received consult(s) for Vancomycin from an ED provider.  The patient's profile has been reviewed for ht/wt/allergies/indication/available labs.    One time order(s) placed for Vancomycin 750 mg IV X 1.  Further antibiotics/pharmacy consults should be ordered by admitting physician if indicated.                       Thank you, Shalisha Clausing D 01/14/2022  2:21 AM

## 2022-01-14 NOTE — Assessment & Plan Note (Signed)
stable °

## 2022-01-14 NOTE — ED Notes (Signed)
Pt noted in room taking off wet brief, and slightly confused.  Pt easily redirectable.  Clean brief placed on pt and purewick placed back on pt in appropriate position.

## 2022-01-14 NOTE — Assessment & Plan Note (Signed)
Patient is not septic - IV Rocephin -Follow-up urine culture

## 2022-01-15 DIAGNOSIS — I5043 Acute on chronic combined systolic (congestive) and diastolic (congestive) heart failure: Secondary | ICD-10-CM | POA: Diagnosis not present

## 2022-01-15 LAB — CBC
HCT: 36.9 % (ref 36.0–46.0)
Hemoglobin: 12 g/dL (ref 12.0–15.0)
MCH: 28.5 pg (ref 26.0–34.0)
MCHC: 32.5 g/dL (ref 30.0–36.0)
MCV: 87.6 fL (ref 80.0–100.0)
Platelets: 110 10*3/uL — ABNORMAL LOW (ref 150–400)
RBC: 4.21 MIL/uL (ref 3.87–5.11)
RDW: 14.6 % (ref 11.5–15.5)
WBC: 7.3 10*3/uL (ref 4.0–10.5)
nRBC: 0 % (ref 0.0–0.2)

## 2022-01-15 LAB — BASIC METABOLIC PANEL
Anion gap: 9 (ref 5–15)
BUN: 15 mg/dL (ref 8–23)
CO2: 29 mmol/L (ref 22–32)
Calcium: 8.5 mg/dL — ABNORMAL LOW (ref 8.9–10.3)
Chloride: 102 mmol/L (ref 98–111)
Creatinine, Ser: 1 mg/dL (ref 0.44–1.00)
GFR, Estimated: 59 mL/min — ABNORMAL LOW (ref >60)
Glucose, Bld: 88 mg/dL (ref 70–99)
Potassium: 3.5 mmol/L (ref 3.5–5.1)
Sodium: 140 mmol/L (ref 135–145)

## 2022-01-15 LAB — URINE CULTURE: Culture: NO GROWTH

## 2022-01-15 MED ORDER — ASCORBIC ACID 500 MG PO TABS
500.0000 mg | ORAL_TABLET | Freq: Two times a day (BID) | ORAL | Status: DC
  Administered 2022-01-15 – 2022-01-22 (×14): 500 mg via ORAL
  Filled 2022-01-15 (×15): qty 1

## 2022-01-15 MED ORDER — FUROSEMIDE 20 MG PO TABS
20.0000 mg | ORAL_TABLET | Freq: Every day | ORAL | Status: DC
  Administered 2022-01-16 – 2022-01-22 (×7): 20 mg via ORAL
  Filled 2022-01-15 (×7): qty 1

## 2022-01-15 MED ORDER — ADULT MULTIVITAMIN W/MINERALS CH
1.0000 | ORAL_TABLET | Freq: Every day | ORAL | Status: DC
Start: 2022-01-15 — End: 2022-01-22
  Administered 2022-01-15 – 2022-01-22 (×8): 1 via ORAL
  Filled 2022-01-15 (×8): qty 1

## 2022-01-15 MED ORDER — ENOXAPARIN SODIUM 30 MG/0.3ML IJ SOSY
30.0000 mg | PREFILLED_SYRINGE | INTRAMUSCULAR | Status: DC
  Administered 2022-01-15 – 2022-01-21 (×6): 30 mg via SUBCUTANEOUS
  Filled 2022-01-15 (×6): qty 0.3

## 2022-01-15 MED ORDER — ZINC SULFATE 220 (50 ZN) MG PO CAPS
220.0000 mg | ORAL_CAPSULE | Freq: Every day | ORAL | Status: DC
  Administered 2022-01-15 – 2022-01-22 (×8): 220 mg via ORAL
  Filled 2022-01-15 (×8): qty 1

## 2022-01-15 MED ORDER — ISOSORBIDE MONONITRATE ER 30 MG PO TB24
30.0000 mg | ORAL_TABLET | Freq: Every day | ORAL | Status: DC
  Administered 2022-01-15 – 2022-01-17 (×3): 30 mg via ORAL
  Filled 2022-01-15 (×3): qty 1

## 2022-01-15 MED ORDER — ENSURE ENLIVE PO LIQD
237.0000 mL | Freq: Three times a day (TID) | ORAL | Status: DC
  Administered 2022-01-15 – 2022-01-22 (×21): 237 mL via ORAL

## 2022-01-15 NOTE — Progress Notes (Signed)
PROGRESS NOTE    Christine [name]  YHC:[id number] DOB: 21-May-1946 DOA: 01/14/2022 PCP: Baxter Hire, MD  [id number]-[id number]  LOS: 1 day   Brief hospital course: No notes on file  Assessment & Plan: Christine [name] is a 76 y.o. female with medical history significant of CHF with EF 25-30, hypertension, hyperlipidemia, COPD, anxiety, CAD, stent placement, ICD placement, breast cancer with chronic left chest wound, AAA, thrombocytopenia, cognitive decline, PVD and recent admission for critical left lower extremity ischemia (s/p of reconstruction byb VVS), CKD-3a, who presents with shortness breath and visual hallucination.   * Acute on chronic combined systolic and diastolic CHF (congestive heart failure) ([place]) Patient has shortness of breath, and crackles on auscultation, positive JVD, elevated BNP 2034, clinically consistent with CHF exacerbation.  2D echo on 12/04/2021 showed EF 25-30% with grade 1 diastolic dysfunction.  Consulted Dr. Nehemiah Massed of cardiology. --received IV lasix 40 mg x2 and IV lasix 20 mg x2 Plan: --transition to oral lasix 20 mg daily tomorrow, per cardio --decrease coreg to 12.5 mg BID due to soft BP, per cardio -Hold Irbesartan with diuresis and relative hypotension   CAD (coronary artery disease) -Continue aspirin and Plavix daily --cont statin  Acute metabolic encephalopathy CT head negative.  Unclear etiology.   -Frequent neurochecks --tele sitter  Chronic obstructive pulmonary disease ([place]) --stable  Essential hypertension --cong coreg, lasix and Imdur  HLD (hyperlipidemia) - Crestor  Thrombocytopenia ([place]) This is a chronic issue.  Platelet 86.  No active bleeding -Follow-up with CBC  Anxiety --hold benzo due to mental status  PAD (peripheral artery disease) (HCC) - Aspirin, Plavix, Crestor  Underweight --supplements per dietician    Open wound_left chest wall, chronic Cellulitis ruled out --wound care consulted, wound has not changed in  appearance.  Does not appear to be cellulitis --d/c ceftriaxone  History of breast cancer - Continue tamoxifen  Chronic kidney disease, stage 3a (HCC) Stable -Follow-up with CBC  UTI, ruled out --urine cx no growth --d/c ceftriaxone  Malnutrition, ruled out --pt was seen by dietician, not dx with malnutrition   DVT prophylaxis: Lovenox SQ Code Status: DNR  Family Communication:  Level of care: Telemetry Cardiac Dispo:   The patient is from: home Anticipated d/c is to: undetermined Anticipated d/c date is: undetermined Patient currently is not medically ready to d/c due to: pending PT/OT eval   Subjective and Interval History:  Pt was calm and pleasant during rounds today.  Complained of pain with her chest wound.  Denied dysuria and dyspnea.     Objective: Vitals:   01/15/22 0408 01/15/22 0739 01/15/22 1155 01/15/22 1622  BP: (!) 153/84 (!) 148/89 (!) 95/54 (!) 101/51  Pulse: 72 70 76 78  Resp: '17 18 17 16  '$ Temp: 98.3 F (36.8 C) 98.3 F (36.8 C) 97.8 F (36.6 C) 98.4 F (36.9 C)  TempSrc: Oral Oral Oral   SpO2: 97% 100% 98% 96%  Weight: 31.5 kg     Height:        Intake/Output Summary (Last 24 hours) at 01/15/2022 1858 Last data filed at 01/15/2022 1406 Gross per 24 hour  Intake 340.17 ml  Output 1850 ml  Net -1509.83 ml   Filed Weights   01/13/22 2313 01/15/22 0408  Weight: 36.3 kg 31.5 kg    Examination:   Constitutional: NAD, AAOx3 HEENT: conjunctivae and lids normal, EOMI CV: No cyanosis.   RESP: normal respiratory effort, on RA Extremities: No effusions, edema in BLE SKIN: warm, dry Neuro: II -  XII grossly intact.   Psych: Normal mood and affect.     Data Reviewed: I have personally reviewed labs and imaging studies  Time spent: 50 minutes  Enzo Bi, MD [facility] If 7PM-7AM, please contact night-coverage 01/15/2022, 6:58 PM

## 2022-01-15 NOTE — Evaluation (Signed)
Occupational Therapy Evaluation Patient Details Name: Christine [name] MRN: [id number] DOB: 1946-01-02 Today's Date: 01/15/2022   History of Present Illness 76 year old female with history of HFrEF (LVEF 25-30%, G1 DD, LV apical segment akinesis 11/2021) with an abandoned ICD for several years, CAD s/p multiple stents 2006, PAD with recent admission in June 2023 with critical limb ischemia s/p distal abdominal aorta reconstruction, left external iliac artery & left common iliac artery stenting and history of renal artery stent 02/2019, left breast cancer s/p mastectomy with metastases to the lungs with large necrotic left chest wall wound, hypertension, hyperlipidemia, ongoing tobacco use who presents to [facility] ED 01/14/2022 with visual hallucinations, dyspnea on exertion and bilateral lower extremity swelling x1 month   Clinical Impression   Patient presenting with decreased Ind in self care, balance, functional mobility/transfers, endurance, and safety awareness. Patient reports living at home independently without use of AD. She does report doing her own cooking and laundry. She does not drive and endorses son helps her get groceries. No family present to confirm baseline information. Pt is having visual hallucinations during assessment and reports seeing bugs on bed. Pain reported in L LE and RW utilized for her to be more mobile during session. Patient currently functioning at supervision - min guard with use of RW. Pt needs 24/7 supervision for safety and cognition. Patient will benefit from acute OT to increase overall independence in the areas of ADLs, functional mobility, and safety awareness in order to safely discharge home with supervision.      Recommendations for follow up therapy are one component of a multi-disciplinary discharge planning process, led by the attending physician.  Recommendations may be updated based on patient status, additional functional criteria and insurance authorization.    Follow Up Recommendations  Home health OT    Assistance Recommended at Discharge Frequent or constant Supervision/Assistance  Patient can return home with the following A little help with walking and/or transfers;A little help with bathing/dressing/bathroom;Help with stairs or ramp for entrance;Direct supervision/assist for medications management;Direct supervision/assist for financial management;Assistance with cooking/housework    Functional Status Assessment  Patient has had a recent decline in their functional status and demonstrates the ability to make significant improvements in function in a reasonable and predictable amount of time.  Equipment Recommendations  None recommended by OT       Precautions / Restrictions Precautions Precautions: Fall      Mobility Bed Mobility Overal bed mobility: Modified Independent             General bed mobility comments: no physical assistance provided with increased time and effort    Transfers Overall transfer level: Needs assistance Equipment used: Rolling walker (2 wheels) Transfers: Sit to/from Stand, Bed to chair/wheelchair/BSC Sit to Stand: Min guard     Step pivot transfers: Supervision, Min guard            Balance Overall balance assessment: Needs assistance Sitting-balance support: Feet supported, Bilateral upper extremity supported Sitting balance-Leahy Scale: Good     Standing balance support: Reliant on assistive device for balance, During functional activity, Bilateral upper extremity supported Standing balance-Leahy Scale: Fair                             ADL either performed or assessed with clinical judgement   ADL Overall ADL's : Needs assistance/impaired  General ADL Comments: supervision - min guard with use of RW     Vision Patient Visual Report: No change from baseline              Pertinent Vitals/Pain Pain  Assessment Pain Assessment: Faces Faces Pain Scale: Hurts even more Pain Location: L LE Pain Descriptors / Indicators: Discomfort Pain Intervention(s): Monitored during session, Repositioned     Hand Dominance Right   Extremity/Trunk Assessment Upper Extremity Assessment Upper Extremity Assessment: Overall WFL for tasks assessed;Generalized weakness   Lower Extremity Assessment Lower Extremity Assessment: Overall WFL for tasks assessed;Generalized weakness       Communication Communication Communication: HOH   Cognition Arousal/Alertness: Awake/alert Behavior During Therapy: Flat affect Overall Cognitive Status: No family/caregiver present to determine baseline cognitive functioning                                 General Comments: Pt is oriented to self, location, and date. She is still visually hallucinating during session.                Home Living Family/patient expects to be discharged to:: Private residence Living Arrangements: Alone Available Help at Discharge: Other (Comment) (unsure) Type of Home: Apartment Home Access: Stairs to enter Entrance Stairs-Number of Steps: 3-4   Home Layout: One level     Bathroom Shower/Tub: Tub/shower unit         Home Equipment: Chartered certified accountant          Prior Functioning/Environment Prior Level of Function : Independent/Modified Independent             Mobility Comments: Pt is a questionable historian but reports she lives alone, ambulatory without AD. ADLs Comments: Pt reports she is Indep with ADL, household and community amb without AD, cooking/cleaning. She does report that son assists with transportation, shopping/groceries, and sets up medication using am/pm pill box.        OT Problem List: Decreased strength;Decreased activity tolerance;Impaired balance (sitting and/or standing);Decreased safety awareness      OT Treatment/Interventions: Self-care/ADL training;Therapeutic  exercise;Therapeutic activities;DME and/or AE instruction;Manual therapy;Balance training;Patient/family education;Energy conservation    OT Goals(Current goals can be found in the care plan section) Acute Rehab OT Goals Patient Stated Goal: to go home OT Goal Formulation: With patient Time For Goal Achievement: 01/29/22 Potential to Achieve Goals: Good ADL Goals Pt Will Perform Grooming: with modified independence;standing Pt Will Perform Lower Body Dressing: with modified independence;sit to/from stand Pt Will Transfer to Toilet: with modified independence;ambulating Pt Will Perform Toileting - Clothing Manipulation and hygiene: with modified independence;sit to/from stand  OT Frequency: Min 2X/week       AM-PAC OT "6 Clicks" Daily Activity     Outcome Measure Help from another person eating meals?: None Help from another person taking care of personal grooming?: A Little Help from another person toileting, which includes using toliet, bedpan, or urinal?: A Little Help from another person bathing (including washing, rinsing, drying)?: A Little Help from another person to put on and taking off regular upper body clothing?: None Help from another person to put on and taking off regular lower body clothing?: A Little 6 Click Score: 20   End of Session Equipment Utilized During Treatment: Rolling walker (2 wheels) Nurse Communication: Mobility status  Activity Tolerance: Patient tolerated treatment well Patient left: in bed;with call bell/phone within reach;with bed alarm set  OT Visit Diagnosis: Unsteadiness on feet (R26.81);Muscle weakness (  generalized) (M62.81);Repeated falls (R29.6)                Time: 7672-0947 OT Time Calculation (min): 18 min Charges:  OT General Charges $OT Visit: 1 Visit OT Evaluation $OT Eval Low Complexity: 1 Low OT Treatments $Self Care/Home Management : 8-22 mins  Darleen Crocker, MS, OTR/L , CBIS ascom [phone]  01/15/22, 12:34 PM

## 2022-01-15 NOTE — Progress Notes (Signed)
PHARMACIST - PHYSICIAN COMMUNICATION  CONCERNING:  Enoxaparin (Lovenox) for DVT Prophylaxis    RECOMMENDATION: Patient was prescribed enoxaprin '40mg'$  q24 hours for VTE prophylaxis.   Filed Weights   01/13/22 2313 01/15/22 0408  Weight: 36.3 kg (80 lb) 31.5 kg (69 lb 8 oz)    Body mass index is 12.31 kg/m.  Estimated Creatinine Clearance: 24.5 mL/min (by C-G formula based on SCr of 1 mg/dL).    Patient is candidate for enoxaparin '30mg'$  every 24 hours based on CrCl <25m/min and Weight <45kg  DESCRIPTION: [facility]  Patient is now receiving enoxaparin 30 mg every 24 hours    WDelena Bali PharmD PGY-1 Pharmacy Resident 01/15/2022 7:04 PM

## 2022-01-15 NOTE — Evaluation (Signed)
Physical Therapy Evaluation Patient Details Name: Christine [name] MRN: [id number] DOB: [birth year] Today's Date: 01/15/2022  History of Present Illness  76 year old female with history of HFrEF (LVEF 25-30%, G1 DD, LV apical segment akinesis 11/2021) with an abandoned ICD for several years, CAD s/p multiple stents 2006, PAD with recent admission in June 2023 with critical limb ischemia s/p distal abdominal aorta reconstruction, left external iliac artery & left common iliac artery stenting and history of renal artery stent 02/2019, left breast cancer s/p mastectomy with metastases to the lungs with large necrotic left chest wall wound, hypertension, hyperlipidemia, ongoing tobacco use who presents to [facility] ED 01/14/2022 with visual hallucinations, dyspnea on exertion and bilateral lower extremity swelling x1 month.   Clinical Impression  Patient alert, agreeable to PT with some encouragement. Oriented to self, "[name]", August 2nd, disoriented to situation and often had some tangential conversation or displayed some confusion with evaluation. PLOF information questionable, but per chart pt does live alone (pt endorses this), and that a neighbor and a son assist with IADLs.   The patient was able to perform bed mobility modI with extra time. Sit <> Stand with RW And CGA, 1 LOB noted (posterior) with first standing, but no further LOB noted. She ambulated ~52f total with CGA- supervision. Returned to room, did exhibit decreased safety awareness with RW management, room navigation.  Overall the patient demonstrated deficits (see "PT Problem List") that impede the patient's functional abilities, safety, and mobility and would benefit from skilled PT intervention. Recommendation at this time is 24/7 supervision/assistance due to safety concerns of pt living alone. If unable to obtain at home via family/PCAs may benefit from transition to ALF/higher levels of care/LTC.        Recommendations for follow up  therapy are one component of a multi-disciplinary discharge planning process, led by the attending physician.  Recommendations may be updated based on patient status, additional functional criteria and insurance authorization.  Follow Up Recommendations Other (comment); 24/7 supervision/assistance/higher levels of care.      Assistance Recommended at Discharge Frequent or constant Supervision/Assistance  Patient can return home with the following  A little help with bathing/dressing/bathroom;Assistance with cooking/housework;A little help with walking and/or transfers;Direct supervision/assist for financial management;Assist for transportation;Help with stairs or ramp for entrance;Direct supervision/assist for medications management    Equipment Recommendations BSC/3in1;Rolling walker (2 wheels)  Recommendations for Other Services       Functional Status Assessment Patient has not had a recent decline in their functional status     Precautions / Restrictions Precautions Precautions: Fall Restrictions Weight Bearing Restrictions: No      Mobility  Bed Mobility Overal bed mobility: Modified Independent                  Transfers Overall transfer level: Needs assistance Equipment used: Rolling walker (2 wheels) Transfers: Sit to/from Stand Sit to Stand: Min guard           General transfer comment: 1 posterior LOB with first stand, no LOB for remainder of session with RW    Ambulation/Gait   Gait Distance (Feet): 95 Feet Assistive device: Rolling walker (2 wheels)         General Gait Details: narrow base of support  Stairs            Wheelchair Mobility    Modified Rankin (Stroke Patients Only)       Balance Overall balance assessment: Needs assistance Sitting-balance support: Feet supported, Bilateral upper extremity supported Sitting  balance-Leahy Scale: Good     Standing balance support: Reliant on assistive device for balance, During  functional activity, Bilateral upper extremity supported Standing balance-Leahy Scale: Fair                               Pertinent Vitals/Pain Pain Assessment Pain Assessment: Faces Faces Pain Scale: Hurts a little bit Pain Location: L LE > RLE, feet Pain Descriptors / Indicators: Discomfort Pain Intervention(s): Monitored during session, Repositioned, Limited activity within patient's tolerance    Home Living Family/patient expects to be discharged to:: Private residence Living Arrangements: Alone Available Help at Discharge: Other (Comment) (unsure) Type of Home: Apartment Home Access: Stairs to enter   Entrance Stairs-Number of Steps: 3-4   Home Layout: One level Home Equipment: Standard Walker      Prior Function Prior Level of Function : Independent/Modified Independent             Mobility Comments: Pt is a questionable historian but reports she lives alone, ambulatory without AD. ADLs Comments: Pt reports she is Indep with ADL, household and community amb without AD, cooking/cleaning. She does report that son assists with transportation, shopping/groceries, and sets up medication using am/pm pill box.     Hand Dominance   Dominant Hand: Right    Extremity/Trunk Assessment   Upper Extremity Assessment Upper Extremity Assessment: Defer to OT evaluation    Lower Extremity Assessment Lower Extremity Assessment: Generalized weakness    Cervical / Trunk Assessment Cervical / Trunk Assessment: Normal  Communication   Communication: HOH  Cognition Arousal/Alertness: Awake/alert Behavior During Therapy: Flat affect Overall Cognitive Status: No family/caregiver present to determine baseline cognitive functioning                                 General Comments: Pt is oriented to self, location, and date. confusion noted throughout conversation, decreased STM, conflicting PLOF information        General Comments      Exercises      Assessment/Plan    PT Assessment Patient needs continued PT services  PT Problem List Decreased strength;Decreased mobility;Decreased range of motion;Decreased activity tolerance;Decreased balance;Pain;Decreased knowledge of use of DME;Decreased knowledge of precautions;Decreased safety awareness       PT Treatment Interventions DME instruction;Therapeutic exercise;Gait training;Stair training;Neuromuscular re-education;Balance training;Functional mobility training;Therapeutic activities;Patient/family education    PT Goals (Current goals can be found in the Care Plan section)  Acute Rehab PT Goals Patient Stated Goal: to feel better PT Goal Formulation: With patient Time For Goal Achievement: 01/29/22 Potential to Achieve Goals: Fair    Frequency Min 2X/week     Co-evaluation               AM-PAC PT "6 Clicks" Mobility  Outcome Measure Help needed turning from your back to your side while in a flat bed without using bedrails?: None Help needed moving from lying on your back to sitting on the side of a flat bed without using bedrails?: None Help needed moving to and from a bed to a chair (including a wheelchair)?: None Help needed standing up from a chair using your arms (e.g., wheelchair or bedside chair)?: None Help needed to walk in hospital room?: A Little Help needed climbing 3-5 steps with a railing? : A Little 6 Click Score: 22    End of Session Equipment Utilized During Treatment: Gait belt  Activity Tolerance: Patient tolerated treatment well Patient left: with chair alarm set;in chair;with call bell/phone within reach Nurse Communication: Mobility status PT Visit Diagnosis: Other abnormalities of gait and mobility (R26.89);Difficulty in walking, not elsewhere classified (R26.2);Muscle weakness (generalized) (M62.81)    Time: 7573-2256 PT Time Calculation (min) (ACUTE ONLY): 17 min   Charges:   PT Evaluation $PT Eval Low Complexity: 1 Low PT  Treatments $Therapeutic Activity: 8-22 mins        Lieutenant Diego PT, DPT 12:55 PM,01/15/22

## 2022-01-15 NOTE — Progress Notes (Signed)
[place] NOTE       Patient ID: Christine [name] MRN: [id number] DOB/AGE: [birth year] 76 y.o.  Admit date: 01/14/2022 Referring Physician Dr. Blaine Hamper Primary Physician Dr. Harrel Lemon Primary Cardiologist Dr. Caryl Comes  Reason for Consultation acute on chronic HF  HPI: Christine [name] is a 76 year old female with history of HFrEF (LVEF 25-30%, G1 DD, LV apical segment akinesis 11/2021) with an abandoned ICD for several years, CAD s/p multiple stents 2006, PAD with recent admission in June 2023 with critical limb ischemia s/p distal abdominal aorta reconstruction, left external iliac artery & left common iliac artery stenting and history of renal artery stent 02/2019, left breast cancer s/p mastectomy with metastases to the lungs with large necrotic left chest wall wound, hypertension, hyperlipidemia, ongoing tobacco use who presents to [facility] ED 01/14/2022 with visual hallucinations, dyspnea on exertion and bilateral lower extremity swelling x1 month. Cardiology is consulted for assistance with her heart failure.  Interval History: - excellent diuresis yesterday, net neg 4.2L  - still having visual hallucinations of bugs and men in her room  - complains of persistent leg pain and burning on her left chest wall (re: chest wound) - oriented to self, month, year, and place today but remains a difficult historian and can tell me little about her medical history other that "thinking her heart has a lot to do with is" but has difficulty speaking of the details  Review of systems limited by patient's mental status   Past Medical History:  Diagnosis Date   Allergic rhinitis    Anxiety    Breast cancer ([place])    11/26/20 Pt reports wound/lesion on left breast   Chronic systolic heart failure (HCC)    Coronary artery disease    GERD (gastroesophageal reflux disease)    Heart murmur    Hyperlipidemia    Hypertension    Implantable defibrillator [employer]    DOI 2006   MI (myocardial  infarction) ([place])    Osteoarthritis    Wears dentures    full upper and lower    Past Surgical History:  Procedure Laterality Date   CHF exacerbation  10/07   CORONARY STENT PLACEMENT  8/06   3 stents; myocardial infarction   LOWER EXTREMITY ANGIOGRAPHY Left 02/15/2019   Procedure: LOWER EXTREMITY ANGIOGRAPHY;  Surgeon: Katha Cabal, MD;  Location: [facility];  Service: Cardiovascular;  Laterality: Left;   LOWER EXTREMITY ANGIOGRAPHY Left 12/04/2021   Procedure: Lower Extremity Angiography;  Surgeon: Katha Cabal, MD;  Location: [facility];  Service: Cardiovascular;  Laterality: Left;   NM MYOVIEW LTD  1/08   EF 39%, no sx ischemia   PACEMAKER PLACEMENT  2/07   Defibrillator   RENAL ARTERY STENT Left     Medications Prior to Admission  Medication Sig Dispense Refill Last Dose   aspirin EC 81 MG tablet Take 81 mg by mouth daily.   unk at unk   carvedilol (COREG) 25 MG tablet TAKE 1 TABLET BY MOUTH TWICE A DAY (Patient taking differently: Take 25 mg by mouth 2 (two) times daily with a meal.) 60 tablet 2 unk at unk   clopidogrel (PLAVIX) 75 MG tablet TAKE 1 TABLET BY MOUTH ONCE DAILY (Patient taking differently: Take 75 mg by mouth daily.) 30 tablet 4 unk at unk   hydrOXYzine (ATARAX) 25 MG tablet Take 25 mg by mouth 3 (three) times daily.   unk at unk   pantoprazole (PROTONIX) 40 MG tablet  Take 40 mg by mouth daily.    unk at unk   rosuvastatin (CRESTOR) 10 MG tablet Take 10 mg by mouth daily.    unk at unk   tamoxifen (NOLVADEX) 20 MG tablet Take 1 tablet (20 mg total) by mouth daily. 90 tablet 3 unk at unk   traMADol (ULTRAM) 50 MG tablet Take 25 mg by mouth 2 (two) times daily.   unk at unk   valsartan (DIOVAN) 320 MG tablet Take 320 mg by mouth daily at 12 noon.    unk at unk   bisacodyl (DULCOLAX) 5 MG EC tablet Take 5 mg by mouth daily as needed for mild constipation or moderate constipation.   prn at prn   isosorbide mononitrate (IMDUR) 30 MG 24 hr  tablet Take 1 tablet (30 mg total) by mouth daily. (Patient not taking: Reported on 01/14/2022) 30 tablet 0 Not Taking   LORazepam (ATIVAN) 0.5 MG tablet Take 0.5 tablets (0.25 mg total) by mouth 2 (two) times daily as needed for anxiety. 30 tablet 0 prn at prn   Polyethylene Glycol 3350 (PEG 3350) POWD Take 17 g by mouth daily as needed (constipation.).    prn at prn    Social History   Socioeconomic History   Marital status: Divorced    Spouse name: Not on file   Number of children: 1   Years of education: Not on file   Highest education level: Not on file  Occupational History   Occupation: Artist    Comment: Disabled  Tobacco Use   Smoking status: Every Day    Packs/day: 0.50    Years: 12.00    Total pack years: 6.00    Types: Cigarettes   Smokeless tobacco: Never   Tobacco comments:       Vaping Use   Vaping Use: Never used  Substance and Sexual Activity   Alcohol use: No    Comment: 2-3 times per month   Drug use: No   Sexual activity: Not on file  Other Topics Concern   Not on file  Social History Narrative   Pt is disabled but is hoping to back to work part time.   Has been divorced twice.   Gets regular exercise.   Social Determinants of Health   Financial Resource Strain: Not on file  Food Insecurity: Not on file  Transportation Needs: Not on file  Physical Activity: Not on file  Stress: Not on file  Social Connections: Not on file  Intimate Partner Violence: Not on file    Family History  Problem Relation Age of Onset   Stroke Mother 28   Heart attack Father    Coronary artery disease Father    Heart attack Brother    Coronary artery disease Other    Hypertension Other    Cancer Neg Hx        No breast or colon cancer    PHYSICAL EXAM General: Elderly and cachectic Caucasian female, in no acute distress.  Sitting upright in recliner, tele sitter present.  HEENT:  Normocephalic and atraumatic. Neck:  No JVD.  Chest: Left-sided  defibrillator present.  Large ulcerative wound covered with dressing with a satellite nickel sized ulcerative lesion medially Lungs: Normal respiratory effort on room air. Clear bilaterally to auscultation. No wheezes, crackles, rhonchi.  Heart: HRRR . Normal S1 and S2 without gallops or murmurs.  Abdomen: Non-distended appearing.  Msk: Normal strength and tone for age. Extremities: Bilateral lower extremities chronic venous stasis changes  and much improved trace edema, left leg with dusky appearance to her lower shin and top of her foot with tenderness to palpation of her left foot.   Neuro: Alert and oriented to self, month, year, and place.  Psych: visual hallucinations of a man in the room during interview  Labs:   Lab Results  Component Value Date   WBC 7.3 01/15/2022   HGB 12.0 01/15/2022   HCT 36.9 01/15/2022   MCV 87.6 01/15/2022   PLT 110 (L) 01/15/2022    Recent Labs  Lab 01/13/22 2318 01/15/22 0427  NA 140 140  K 3.8 3.5  CL 108 102  CO2 27 29  BUN 13 15  CREATININE 1.10* 1.00  CALCIUM 8.4* 8.5*  PROT 6.1*  --   BILITOT 0.8  --   ALKPHOS 49  --   ALT 12  --   AST 22  --   GLUCOSE 123* 88    Lab Results  Component Value Date   TROPONINI <0.03 12/20/2014    Lab Results  Component Value Date   CHOL 105 08/31/2011   CHOL 194 06/04/2009   CHOL 185 11/28/2008   Lab Results  Component Value Date   HDL 26 (L) 08/31/2011   HDL 38.20 (L) 06/04/2009   HDL 40.90 11/28/2008   Lab Results  Component Value Date   LDLCALC 58 08/31/2011   LDLCALC 113 (H) 11/28/2008   LDLCALC 116 (H) 02/02/2008   Lab Results  Component Value Date   TRIG 105 08/31/2011   TRIG 218.0 (H) 06/04/2009   TRIG 158.0 (H) 11/28/2008   Lab Results  Component Value Date   CHOLHDL 5 06/04/2009   CHOLHDL 5 11/28/2008   CHOLHDL 4.4 CALC 02/02/2008   Lab Results  Component Value Date   LDLDIRECT 126.0 06/04/2009   LDLDIRECT 118.9 11/16/2006   LDLDIRECT 117.1 08/25/2006       Radiology: DG Chest Portable 1 View  Result Date: 01/14/2022 CLINICAL DATA:  Shortness of breath on exertion EXAM: PORTABLE CHEST 1 VIEW COMPARISON:  12/03/2021 FINDINGS: Cardiac shadow is enlarged. Defibrillator is again noted. Right chest wall port is again seen. Lungs are clear bilaterally. No focal infiltrate is noted. IMPRESSION: No active disease. Electronically Signed   By: Inez Catalina M.D.   On: 01/14/2022 02:03   CT HEAD WO CONTRAST (5MM)  Result Date: 01/13/2022 CLINICAL DATA:  Mental status change of unknown cause. EXAM: CT HEAD WITHOUT CONTRAST TECHNIQUE: Contiguous axial images were obtained from the base of the skull through the vertex without intravenous contrast. RADIATION DOSE REDUCTION: This exam was performed according to the departmental dose-optimization program which includes automated exposure control, adjustment of the mA and/or kV according to patient size and/or use of iterative reconstruction technique. COMPARISON:  11/08/2021 FINDINGS: Brain: Diffuse cerebral atrophy. Ventricular dilatation consistent with central atrophy. Low-attenuation changes in the deep white matter consistent with small vessel ischemia. No abnormal extra-axial fluid collections. No mass effect or midline shift. Gray-white matter junctions are distinct. Basal cisterns are not effaced. No acute intracranial hemorrhage. Basal ganglia calcifications representing normal variation. Old lacunar infarct in the left basal ganglia. Vascular: No hyperdense vessel or unexpected calcification. Skull: Calvarium appears intact. Sinuses/Orbits: Paranasal sinuses and mastoid air cells are clear. Other: None. IMPRESSION: No acute intracranial abnormalities. Chronic atrophy and small vessel ischemic changes. Electronically Signed   By: Lucienne Capers M.D.   On: 01/13/2022 23:58    ECHO 12/04/2021   1. Left ventricular ejection fraction, by estimation, is  25 to 30%. The  left ventricle has severely decreased function.  The left ventricle  demonstrates regional wall motion abnormalities (see scoring  diagram/findings for description). There is mild left   ventricular hypertrophy of the basal-septal segment. Left ventricular  diastolic parameters are consistent with Grade I diastolic dysfunction  (impaired relaxation). There is akinesis of the left ventricular, entire  apical segment.   2. Right ventricular systolic function is normal. The right ventricular  size is normal.   3. Left atrial size was severely dilated.   4. A small pericardial effusion is present.   5. The mitral valve is normal in structure. Mild mitral valve  regurgitation.   6. The aortic valve was not well visualized. Aortic valve regurgitation  is trivial.   7. The inferior vena cava is dilated in size with <50% respiratory  variability, suggesting right atrial pressure of 15 mmHg.   FINDINGS   Left Ventricle: Left ventricular ejection fraction, by estimation, is 25  to 30%. The left ventricle has severely decreased function. The left  ventricle demonstrates regional wall motion abnormalities. Definity  contrast agent was given IV to delineate  the left ventricular endocardial borders. The left ventricular internal  cavity size was normal in size. There is mild left ventricular hypertrophy  of the basal-septal segment. Left ventricular diastolic parameters are  consistent with Grade I diastolic  dysfunction (impaired relaxation).   Right Ventricle: The right ventricular size is normal. No increase in  right ventricular wall thickness. Right ventricular systolic function is  normal.   Left Atrium: Left atrial size was severely dilated.   Right Atrium: Right atrial size was normal in size.   Pericardium: A small pericardial effusion is present.   Mitral Valve: The mitral valve is normal in structure. Mild mitral valve  regurgitation.   Tricuspid Valve: The tricuspid valve is grossly normal. Tricuspid valve  regurgitation  is not demonstrated.   Aortic Valve: The aortic valve was not well visualized. Aortic valve  regurgitation is trivial. Aortic valve mean gradient measures 1.0 mmHg.  Aortic valve peak gradient measures 2.5 mmHg.   Pulmonic Valve: The pulmonic valve was not well visualized. Pulmonic valve  regurgitation is not visualized.   Aorta: The aortic root was not well visualized.   Venous: The inferior vena cava is dilated in size with less than 50%  respiratory variability, suggesting right atrial pressure of 15 mmHg.   IAS/Shunts: No atrial level shunt detected by color flow Doppler.   Additional Comments: A device lead is visualized.         LV Volumes (MOD)  LV vol d, MOD A2C: 68.3 ml Diastology  LV vol d, MOD A4C: 75.5 ml LV e' medial:    3.48 cm/s  LV vol s, MOD A2C: 57.2 ml LV E/e' medial:  11.4  LV vol s, MOD A4C: 61.4 ml LV e' lateral:   3.59 cm/s  LV SV MOD A2C:     11.1 ml LV E/e' lateral: 11.1  LV SV MOD A4C:     75.5 ml  LV SV MOD BP:      13.1 ml   LEFT ATRIUM           Index  LA Vol (A4C): 76.0 ml 55.53 ml/m   AORTIC VALVE  AV Vmax:           79.30 cm/s  AV Vmean:          48.200 cm/s  AV VTI:  0.151 m  AV Peak Grad:      2.5 mmHg  AV Mean Grad:      1.0 mmHg  LVOT Vmax:         48.30 cm/s  LVOT Vmean:        29.800 cm/s  LVOT VTI:          0.083 m  LVOT/AV VTI ratio: 0.55   MITRAL VALVE  MV Area (PHT): 4.89 cm    SHUNTS  MV Decel Time: 155 msec    Systemic VTI: 0.08 m  MV E velocity: 39.80 cm/s  MV A velocity: 50.10 cm/s  MV E/A ratio:  0.79   Kate Sable MD  Electronically signed by Kate Sable MD  Signature Date/Time: 12/04/2021/1:51:51 PM   TELEMETRY reviewed by me: Sinus rhythm with PVCs  EKG reviewed by me: Sinus rhythm, poor R wave progression rate 62, nonspecific T wave abnormalities  ASSESSMENT AND PLAN:  Jeffrey Voth is a 76 year old female with history of HFrEF (LVEF 25-30%, G1 DD, LV apical segment akinesis 11/2021) with  an abandoned ICD for several years, CAD s/p multiple stents 2006, PAD with recent admission in June 2023 with critical limb ischemia s/p distal abdominal aorta reconstruction, left external iliac artery & left common iliac artery stenting and history of renal artery stent 02/2019, left breast cancer s/p mastectomy with metastases to the lungs with large necrotic left chest wall wound, hypertension, hyperlipidemia, ongoing tobacco use who presents to [facility] ED 01/14/2022 with visual hallucinations, dyspnea on exertion and bilateral lower extremity swelling x1 month. Cardiology is consulted for assistance with her heart failure.  #Acute encephalopathy #Acute on chronic HFrEF (LVEF 25-30% 11/2021) with abandoned ICD #CAD s/p multiple stents 2006 #PAD s/p admission 11/2021 with critical left lower leg ischemia s/p stenting The patient presents with visual hallucinations, is oriented to self and year and unable to give much of a history other than telling me her left foot hurts.  She was recently admitted at the end of June and underwent extensive stenting with vascular surgery for critical left lower limb ischemia, and appears to have a longstanding history of noncompliance with outpatient cardiology follow-up.  She appears grossly volume overloaded in her lower extremities with a BNP of 2000, in addition to dusky appearance to her left foot and extreme pain to palpation.  It is unclear what her baseline mental status is, but she is frail appearing and has an open wound to her left chest with a satellite ulcerative lesion medially.  -S/p 40 mg of IV Lasix x2, 20 mg x2 with net negative 4.2L UOP - she appears to be adequately diuresed. Will discharge on '20mg'$  PO lasix daily.  -Continue GDMT with carvedilol12.5 mg twice daily  -Continue aspirin and Plavix daily -Continue imdur '30mg'$  daily  -Hold Irbesartan with diuresis and relative hypotension -Consider input from vascular surgery regarding her left lower leg -Also  consider palliative care input with multiple recent admissions, noncompliance and worsening heart function, and ?untreated breast cancer -no further cardiac diagnostics necessary. Follow up with Dr. Nehemiah Massed in 1-2 weeks.   Cardiology signing off. Please reengage if needed.   This patient's plan of care was discussed and created with Dr. Nehemiah Massed and he is in agreement.  Signed: Tristan Schroeder , PA-C 01/15/2022, 8:10 AM [facility]

## 2022-01-15 NOTE — TOC Initial Note (Signed)
Transition of Care [facility]) - Initial/Assessment Note    Patient Details  Name: Christine [name] MRN: [id number] Date of Birth: 05/12/1946  Transition of Care [facility]) CM/SW Contact:    Laurena Slimmer, RN Phone Number: 01/15/2022, 2:58 PM  Clinical Narrative:                 Patient is active with [facility] for nursing and social work.         Patient Goals and CMS Choice        Expected Discharge Plan and Services                                                Prior Living Arrangements/Services                       Activities of Daily Living      Permission Sought/Granted                  Emotional Assessment              Admission diagnosis:  Acute UTI [N39.0] Acute CHF (congestive heart failure) ([place]) [I50.9] Open chest wound, left, initial encounter [S21.102A] Altered mental status, unspecified altered mental status type [R41.82] Acute on chronic congestive heart failure, unspecified heart failure type ([place]) [I50.9] Patient Active Problem List   Diagnosis Date Noted   Acute on chronic combined systolic and diastolic CHF (congestive heart failure) ([place]) 01/14/2022   HLD (hyperlipidemia) 01/14/2022   Open wound_left chest wall, chronic 67/34/1937   Acute metabolic encephalopathy 90/24/0973   CAD (coronary artery disease) 01/14/2022   Protein-calorie malnutrition, severe ([place]) 01/14/2022   PAD (peripheral artery disease) ([place]) 01/14/2022   Chronic kidney disease, stage 3a ([place]) 01/14/2022   Open chest wound, left, initial encounter    Critical limb ischemia of left lower extremity ([place]) 12/04/2021   HFrEF (heart failure with reduced ejection fraction) ([place]) 12/04/2021   Acute lower limb ischemia 12/03/2021   Urinary tract infection 11/10/2021   UTI (urinary tract infection) 11/09/2021   Dizziness 11/09/2021   Coronary artery disease without angina pectoris 11/09/2021   History of breast cancer 11/09/2021   GERD without  esophagitis 11/09/2021   Cognitive decline 10/01/2021   Chest pain 09/28/2021   Thrombocytopenia ([place]) 09/28/2021   AAA (abdominal aortic aneurysm) without rupture ([place]) 03/13/2019   Breast cancer of upper-inner quadrant of right female breast ([place]) 07/01/2016   Chronic obstructive pulmonary disease ([place]) 04/05/2014   Anxiety 11/25/2013   Breast cancer ([place]) 11/25/2013   Depression 11/25/2013   Implantable cardioverter-defibrillator (ICD) in situ 11/09/2008   ABNORMAL THYROID FUNCTION TESTS 07/30/2007   Essential hypertension 11/16/2006   FAILURE, SYSTOLIC HEART, CHRONIC 53/29/9242   PCP:  Baxter Hire, MD Pharmacy:   [place], [place] [address] Phone: [phone] Fax: [phone]     Social Determinants of Health (SDOH) Interventions    Readmission Risk Interventions     No data to display

## 2022-01-15 NOTE — Progress Notes (Signed)
Initial Nutrition Assessment  DOCUMENTATION CODES:   Underweight  INTERVENTION:   -MVI with minerals daily -500 mg vitamin C BID -220 mg zinc sulfate daily -Ensure Enlive po TID, each supplement provides 350 kcal and 20 grams of protein -Liberalize diet to 2 gram sodium for wider variety of meal selections  NUTRITION DIAGNOSIS:   Increased nutrient needs related to chronic illness (COPD) as evidenced by estimated needs.  GOAL:   Patient will meet greater than or equal to 90% of their needs  MONITOR:   PO intake, Supplement acceptance  REASON FOR ASSESSMENT:   Consult Assessment of nutrition requirement/status  ASSESSMENT:   Pt with medical history significant of CHF with EF 25-30, hypertension, hyperlipidemia, COPD, anxiety, CAD, stent placement, ICD placement, breast cancer with chronic left chest wound, AAA, thrombocytopenia, cognitive decline, PVD and recent admission for critical left lower extremity ischemia (s/p of reconstruction byb VVS), CKD-3a, who presents with shortness breath and altered mental status.  Pt admitted with CHF.   Reviewed I/O's: -3.3 L x 24 hours and -4.2 L since admission  UOP: 3.3 L x 24 hours  Pt unavailable at time of visit. Attempted to speak with pt via call to hospital room phone, however, unable to reach. RD unable to obtain further nutrition-related history or complete nutrition-focused physical exam at this time.     Per H&P, pt was confused and having hallucinations PTA.   Per [facility] notes, pt with non-healing wound to lt chest.   Pt currently on a heart healthy diet. No meal completion data available to assess at this time. Pt is consuming Ensure supplements.   Reviewed wt hx; pt has experienced a 19.2% wt loss over the past year. While this is not significant for time frame, it is concerning given underweight status.  Highly suspect pt with malnutrition, however, unable to identify at this time. Pt would greatly benefit from  addition of oral nutrition supplements.   Medications reviewed and include lasix.  Labs reviewed: CBGS: 129 (inpatient orders for glycemic control are none).    Diet Order:   Diet Order             Diet Heart Room service appropriate? Yes; Fluid consistency: Thin  Diet effective now                   EDUCATION NEEDS:   No education needs have been identified at this time  Skin:  Skin Assessment: Skin Integrity Issues: Skin Integrity Issues:: Other (Comment) Other: non-healing wound to lt chest  Last BM:  Unknown  Height:   Ht Readings from Last 1 Encounters:  01/13/22 '5\' 3"'$  (1.6 m)    Weight:   Wt Readings from Last 1 Encounters:  01/15/22 31.5 kg    Ideal Body Weight:  52.3 kg  BMI:  Body mass index is 12.31 kg/m.  Estimated Nutritional Needs:   Kcal:  1250-1450  Protein:  65-80 grams  Fluid:  > 1.2 L    Loistine Chance, RD, LDN, [place] Registered Dietitian II Certified Diabetes Care and Education Specialist Please refer to [employer] for RD and/or RD on-call/weekend/after hours pager

## 2022-01-16 DIAGNOSIS — I5043 Acute on chronic combined systolic (congestive) and diastolic (congestive) heart failure: Secondary | ICD-10-CM | POA: Diagnosis not present

## 2022-01-16 LAB — CBC
HCT: 34.7 % — ABNORMAL LOW (ref 36.0–46.0)
Hemoglobin: 11.1 g/dL — ABNORMAL LOW (ref 12.0–15.0)
MCH: 28.5 pg (ref 26.0–34.0)
MCHC: 32 g/dL (ref 30.0–36.0)
MCV: 89.2 fL (ref 80.0–100.0)
Platelets: 115 10*3/uL — ABNORMAL LOW (ref 150–400)
RBC: 3.89 MIL/uL (ref 3.87–5.11)
RDW: 14.6 % (ref 11.5–15.5)
WBC: 7 10*3/uL (ref 4.0–10.5)
nRBC: 0 % (ref 0.0–0.2)

## 2022-01-16 LAB — BASIC METABOLIC PANEL
Anion gap: 10 (ref 5–15)
BUN: 27 mg/dL — ABNORMAL HIGH (ref 8–23)
CO2: 26 mmol/L (ref 22–32)
Calcium: 8.5 mg/dL — ABNORMAL LOW (ref 8.9–10.3)
Chloride: 104 mmol/L (ref 98–111)
Creatinine, Ser: 1.1 mg/dL — ABNORMAL HIGH (ref 0.44–1.00)
GFR, Estimated: 53 mL/min — ABNORMAL LOW (ref >60)
Glucose, Bld: 104 mg/dL — ABNORMAL HIGH (ref 70–99)
Potassium: 3.7 mmol/L (ref 3.5–5.1)
Sodium: 140 mmol/L (ref 135–145)

## 2022-01-16 LAB — MAGNESIUM: Magnesium: 2.3 mg/dL (ref 1.7–2.4)

## 2022-01-16 MED ORDER — QUETIAPINE FUMARATE 25 MG PO TABS
25.0000 mg | ORAL_TABLET | Freq: Every day | ORAL | Status: DC
Start: 2022-01-16 — End: 2022-01-22
  Administered 2022-01-16 – 2022-01-21 (×6): 25 mg via ORAL
  Filled 2022-01-16 (×6): qty 1

## 2022-01-16 MED ORDER — HALOPERIDOL LACTATE 5 MG/ML IJ SOLN
2.0000 mg | Freq: Four times a day (QID) | INTRAMUSCULAR | Status: DC | PRN
  Administered 2022-01-16 – 2022-01-17 (×2): 2 mg via INTRAVENOUS
  Filled 2022-01-16 (×3): qty 1

## 2022-01-16 NOTE — NC FL2 (Signed)
[place] LEVEL OF CARE SCREENING TOOL     IDENTIFICATION  Patient Name: Christine [name] Birthdate: 19-Nov-1945 Sex: female Admission Date (Current Location): 01/14/2022  [facility] and [place] Number:  Engineering geologist and Address:  [facility], [address] [place], [place] [address]      Provider Number:    Attending Physician Name and Address:  Enzo Bi, MD  Relative Name and Phone Number:  [name] KGYJEHU,[phone]    Current Level of Care: Hospital Recommended Level of Care: [place] Prior Approval Number:    Date Approved/Denied:   PASRR Number: [id number]  Discharge Plan: SNF    Current Diagnoses: Patient Active Problem List   Diagnosis Date Noted   Acute on chronic combined systolic and diastolic CHF (congestive heart failure) ([place]) 01/14/2022   HLD (hyperlipidemia) 01/14/2022   Open wound_left chest wall, chronic 12/87/8676   Acute metabolic encephalopathy 72/02/4708   CAD (coronary artery disease) 01/14/2022   Underweight 01/14/2022   PAD (peripheral artery disease) ([place]) 01/14/2022   Chronic kidney disease, stage 3a ([place]) 01/14/2022   Open chest wound, left, initial encounter    Critical limb ischemia of left lower extremity ([place]) 12/04/2021   HFrEF (heart failure with reduced ejection fraction) ([place]) 12/04/2021   Acute lower limb ischemia 12/03/2021   Urinary tract infection 11/10/2021   Dizziness 11/09/2021   Coronary artery disease without angina pectoris 11/09/2021   History of breast cancer 11/09/2021   GERD without esophagitis 11/09/2021   Cognitive decline 10/01/2021   Chest pain 09/28/2021   Thrombocytopenia ([place]) 09/28/2021   AAA (abdominal aortic aneurysm) without rupture ([place]) 03/13/2019   Breast cancer of upper-inner quadrant of right female breast ([place]) 07/01/2016   Chronic obstructive pulmonary disease ([place]) 04/05/2014   Anxiety 11/25/2013   Breast cancer ([place]) 11/25/2013    Depression 11/25/2013   Implantable cardioverter-defibrillator (ICD) in situ 11/09/2008   ABNORMAL THYROID FUNCTION TESTS 07/30/2007   Essential hypertension 11/16/2006   FAILURE, SYSTOLIC HEART, CHRONIC 62/83/6629    Orientation RESPIRATION BLADDER Height & Weight     Self, Time, Situation  Normal External catheter Weight: 33.1 kg Height:  '5\' 3"'$  (160 cm)  BEHAVIORAL SYMPTOMS/MOOD NEUROLOGICAL BOWEL NUTRITION STATUS     (n/a) Continent Diet (2 gram sodium)  AMBULATORY STATUS COMMUNICATION OF NEEDS Skin   Limited Assist Verbally Other (Comment), Skin abrasions (Eccyhmosis bilateral arms)                       Personal Care Assistance Level of Assistance  Bathing, Dressing Bathing Assistance: Limited assistance   Dressing Assistance: Limited assistance     Functional Limitations Info  Sight Sight Info: Impaired        SPECIAL CARE FACTORS FREQUENCY  PT (By licensed PT), OT (By licensed OT)     PT Frequency: Min 2x weekly OT Frequency: Min 2x weekly            Contractures Contractures Info: Not present    Additional Factors Info  Code Status, Allergies Code Status Info: DNR Allergies Info: Atorvastatin, Lisinopril, Oxycodone Hcl, Simvastatin           Current Medications (01/16/2022):  This is the current hospital active medication list Current Facility-Administered Medications  Medication Dose Route Frequency Provider Last Rate Last Admin   acetaminophen (TYLENOL) tablet 650 mg  650 mg Oral Q6H PRN Mansy, Arvella Merles, MD       Or   acetaminophen (TYLENOL) suppository  650 mg  650 mg Rectal Q6H PRN Mansy, Jan A, MD       albuterol (PROVENTIL) (2.5 MG/3ML) 0.083% nebulizer solution 3 mL  3 mL Inhalation Q4H PRN Ivor Costa, MD       ascorbic acid (VITAMIN C) tablet 500 mg  500 mg Oral BID Enzo Bi, MD   500 mg at 01/16/22 0850   aspirin EC tablet 81 mg  81 mg Oral Daily Mansy, Jan A, MD   81 mg at 01/16/22 0850   bisacodyl (DULCOLAX) EC tablet 5 mg  5 mg Oral  Daily PRN Mansy, Jan A, MD       carvedilol (COREG) tablet 12.5 mg  12.5 mg Oral BID WC Tang, Alanson Puls, PA-C   12.5 mg at 01/16/22 1558   cefTRIAXone (ROCEPHIN) 1 g in sodium chloride 0.9 % 100 mL IVPB  1 g Intravenous Q24H Mansy, Jan A, MD 200 mL/hr at 01/16/22 0422 1 g at 01/16/22 0422   clopidogrel (PLAVIX) tablet 75 mg  75 mg Oral Daily Mansy, Jan A, MD   75 mg at 01/16/22 0849   dextromethorphan-guaiFENesin (Dante DM) 30-600 MG per 12 hr tablet 1 tablet  1 tablet Oral BID PRN Ivor Costa, MD       diphenhydrAMINE (BENADRYL) injection 12.5 mg  12.5 mg Intravenous Q8H PRN Ivor Costa, MD       enoxaparin (LOVENOX) injection 30 mg  30 mg Subcutaneous Q24H Enzo Bi, MD   30 mg at 01/15/22 2124   feeding supplement (ENSURE ENLIVE / ENSURE PLUS) liquid 237 mL  237 mL Oral TID BM Enzo Bi, MD   237 mL at 01/16/22 1427   furosemide (LASIX) tablet 20 mg  20 mg Oral Daily Tristan Schroeder, PA-C   20 mg at 01/16/22 0240   haloperidol lactate (HALDOL) injection 2 mg  2 mg Intravenous Q6H PRN Enzo Bi, MD       hydrALAZINE (APRESOLINE) injection 5 mg  5 mg Intravenous Q2H PRN Ivor Costa, MD       hydrOXYzine (ATARAX) tablet 25 mg  25 mg Oral TID Mansy, Jan A, MD   25 mg at 01/16/22 1558   isosorbide mononitrate (IMDUR) 24 hr tablet 30 mg  30 mg Oral Daily Tristan Schroeder, PA-C   30 mg at 01/16/22 0850   magnesium hydroxide (MILK OF MAGNESIA) suspension 30 mL  30 mL Oral Daily PRN Mansy, Jan A, MD       multivitamin with minerals tablet 1 tablet  1 tablet Oral Daily Enzo Bi, MD   1 tablet at 01/16/22 0849   nicotine (NICODERM CQ - dosed in mg/24 hours) patch 21 mg  21 mg Transdermal Daily Ivor Costa, MD   21 mg at 01/16/22 0853   pantoprazole (PROTONIX) EC tablet 40 mg  40 mg Oral Daily Mansy, Jan A, MD   40 mg at 01/16/22 0850   polyethylene glycol (MIRALAX / GLYCOLAX) packet 17 g  17 g Oral Daily PRN Mansy, Jan A, MD       QUEtiapine (SEROQUEL) tablet 25 mg  25 mg Oral QHS Enzo Bi, MD        rosuvastatin (CRESTOR) tablet 10 mg  10 mg Oral Daily Mansy, Jan A, MD   10 mg at 01/16/22 0849   tamoxifen (NOLVADEX) tablet 20 mg  20 mg Oral Daily Mansy, Jan A, MD   20 mg at 01/16/22 0850   traMADol (ULTRAM) tablet 25 mg  25 mg Oral BID  Mansy, Jan A, MD   25 mg at 01/16/22 0849   traZODone (DESYREL) tablet 25 mg  25 mg Oral QHS PRN Mansy, Jan A, MD       zinc sulfate capsule 220 mg  220 mg Oral Daily Enzo Bi, MD   220 mg at 01/16/22 8337     Discharge Medications: Please see discharge summary for a list of discharge medications.  Relevant Imaging Results:  Relevant Lab Results:   Additional Information Patient's SS# [id number]  Laurena Slimmer, RN

## 2022-01-16 NOTE — Progress Notes (Signed)
PROGRESS NOTE    Christine [name]  MWU:[id number] DOB: 1945-09-05 DOA: 01/14/2022 PCP: Baxter Hire, MD  [id number]  LOS: 2 days   Brief hospital course: No notes on file  Assessment & Plan: Christine [name] is a 76 y.o. female with medical history significant of CHF with EF 25-30, hypertension, hyperlipidemia, COPD, anxiety, CAD, stent placement, ICD placement, breast cancer with chronic left chest wound, AAA, thrombocytopenia, cognitive decline, PVD and recent admission for critical left lower extremity ischemia (s/p of reconstruction byb VVS), CKD-3a, who presents with shortness breath and visual hallucination.   * Acute on chronic combined systolic and diastolic CHF (congestive heart failure) ([place]) Patient has shortness of breath, and crackles on auscultation, positive JVD, elevated BNP 2034, clinically consistent with CHF exacerbation.  2D echo on 12/04/2021 showed EF 25-30% with grade 1 diastolic dysfunction.  Consulted Dr. Nehemiah Massed of cardiology. --received IV lasix 40 mg x2 and IV lasix 20 mg x2 Plan: --transition to oral lasix 20 mg daily today, per cardio --cont coreg at reduced 12.5 mg BID due to soft BP, per cardio -Hold Irbesartan with diuresis and relative hypotension   CAD (coronary artery disease) -Continue aspirin and Plavix daily --cont statin  Acute metabolic encephalopathy CT head negative.  Unclear etiology.   --delirium vs underlying dementia.   Plan: --sitter at bedside --seroquel 25 mg nightly --IV hadol PRN for agitation  Chronic obstructive pulmonary disease ([place]) --stable  Essential hypertension --cong coreg, lasix and Imdur  HLD (hyperlipidemia) - Crestor  Thrombocytopenia (HCC) This is a chronic issue.  Platelet 86.  No active bleeding -Follow-up with CBC  Anxiety --avoid benzo due to delirium --IV haldol PRN  PAD (peripheral artery disease) (HCC) - Aspirin, Plavix, Crestor  Underweight --supplements per dietician    Open  wound_left chest wall, chronic Cellulitis ruled out --wound care consulted, wound has not changed in appearance.  Does not appear to be cellulitis.  Ceftriaxone d/c'ed.  History of breast cancer - Continue tamoxifen  Chronic kidney disease, stage 3a (HCC) Stable  UTI, ruled out --urine cx no growth --d/c'ed ceftriaxone  Malnutrition, ruled out --pt was seen by dietician, not dx with malnutrition   DVT prophylaxis: Lovenox SQ Code Status: DNR  Family Communication:  Level of care: Telemetry Cardiac Dispo:   The patient is from: home Anticipated d/c is to: undetermined Anticipated d/c date is: undetermined Patient currently is not medically ready to d/c due to: unsafe to be at home by herself   Subjective and Interval History:  Pt had sitter at bedside due to agitation.  Pt said she was not doing well, and appeared to be confused with paranoid delusions.   Objective: Vitals:   01/16/22 0817 01/16/22 1114 01/16/22 1508 01/16/22 1748  BP: (!) 161/95 101/65 138/68 (!) 151/88  Pulse: 76 70 69 80  Resp: '18 18 18 18  '$ Temp: 98.5 F (36.9 C) 98.1 F (36.7 C) 98.1 F (36.7 C) 98.1 F (36.7 C)  TempSrc:      SpO2: 98% 98% 98% 97%  Weight:      Height:        Intake/Output Summary (Last 24 hours) at 01/16/2022 1834 Last data filed at 01/16/2022 0900 Gross per 24 hour  Intake 480 ml  Output 800 ml  Net -320 ml   Filed Weights   01/13/22 2313 01/15/22 0408 01/16/22 0500  Weight: 36.3 kg 31.5 kg 33.1 kg    Examination:   Constitutional: NAD, alert, oriented to self, more confused today  HEENT: conjunctivae and lids normal, EOMI CV: No cyanosis.   RESP: normal respiratory effort, on RA Neuro: II - XII grossly intact.   Psych: paranoid and delusional    Data Reviewed: I have personally reviewed labs and imaging studies  Time spent: 35 minutes  Enzo Bi, MD [facility] If 7PM-7AM, please contact night-coverage 01/16/2022, 6:34 PM

## 2022-01-16 NOTE — Progress Notes (Signed)
Physical Therapy Treatment Patient Details Name: Christine [name] MRN: [id number] DOB: 10/05/1945 Today's Date: 01/16/2022   History of Present Illness 76 year old female with history of HFrEF (LVEF 25-30%, G1 DD, LV apical segment akinesis 11/2021) with an abandoned ICD for several years, CAD s/p multiple stents 2006, PAD with recent admission in June 2023 with critical limb ischemia s/p distal abdominal aorta reconstruction, left external iliac artery & left common iliac artery stenting and history of renal artery stent 02/2019, left breast cancer s/p mastectomy with metastases to the lungs with large necrotic left chest wall wound, hypertension, hyperlipidemia, ongoing tobacco use who presents to [facility] ED 01/14/2022 with visual hallucinations, dyspnea on exertion and bilateral lower extremity swelling x1 month.    PT Comments    Pt resting in bed upon PT arrival; sitter present.  Pt continuously refusing any OOB mobility d/t h/o surgeries (and also recently being up to go to the bathroom) but eventually agreeable to LE ex's in bed (extra time required for ex's d/t pt reporting any touch to LE's caused pain and pt requiring extra cueing on how to perform ex's).  Pt noted to be incontinent of urine in bed so therapist and sitter assisted pt with whole bed linen change.  Will attempt ambulation next session as able.    Recommendations for follow up therapy are one component of a multi-disciplinary discharge planning process, led by the attending physician.  Recommendations may be updated based on patient status, additional functional criteria and insurance authorization.  Follow Up Recommendations  Other (comment) (24/7 supervision/assistance)     Assistance Recommended at Discharge Frequent or constant Supervision/Assistance  Patient can return home with the following A little help with bathing/dressing/bathroom;Assistance with cooking/housework;A little help with walking and/or transfers;Direct  supervision/assist for financial management;Assist for transportation;Help with stairs or ramp for entrance;Direct supervision/assist for medications management   Equipment Recommendations  BSC/3in1;Rolling walker (2 wheels)    Recommendations for Other Services       Precautions / Restrictions Precautions Precautions: Fall Restrictions Weight Bearing Restrictions: No     Mobility  Bed Mobility Overal bed mobility: Modified Independent             General bed mobility comments: logrolling L and R in bed to change linens    Transfers Overall transfer level:  (pt declined any OOB mobility)                      Ambulation/Gait Ambulation/Gait assistance:  (pt declined any OOB mobility)                 Stairs             Wheelchair Mobility    Modified Rankin (Stroke Patients Only)       Balance                                            Cognition Arousal/Alertness: Awake/alert Behavior During Therapy: Restless Overall Cognitive Status: No family/caregiver present to determine baseline cognitive functioning                                 General Comments: Oriented to at least self and location; appearing confused and anxious        Exercises General Exercises - Lower Extremity Ankle Circles/Pumps: AROM, Strengthening,  Both, 10 reps, Supine Quad Sets: AROM, Strengthening, Both, 10 reps, Supine Short Arc Quad: AROM, Strengthening, Both, 10 reps, Supine Heel Slides: AROM, Strengthening, Both, 10 reps, Supine Hip ABduction/ADduction: AROM, Strengthening, Both, 10 reps, Supine Straight Leg Raises: AROM, Strengthening, Both, 10 reps, Supine    General Comments  Nursing cleared pt for participation in physical therapy.  Pt agreeable to limited PT session.      Pertinent Vitals/Pain Pain Assessment Pain Assessment: Faces Faces Pain Scale: Hurts little more (2/10 at rest; 4/10 with activity) Pain  Location: L LE > RLE, feet Pain Descriptors / Indicators: Discomfort Pain Intervention(s): Limited activity within patient's tolerance, Monitored during session, Repositioned    Home Living                          Prior Function            PT Goals (current goals can now be found in the care plan section) Acute Rehab PT Goals Patient Stated Goal: to feel better PT Goal Formulation: With patient Time For Goal Achievement: 01/29/22 Potential to Achieve Goals: Fair Progress towards PT goals: Progressing toward goals    Frequency    Min 2X/week      PT Plan Current plan remains appropriate    Co-evaluation              AM-PAC PT "6 Clicks" Mobility   Outcome Measure  Help needed turning from your back to your side while in a flat bed without using bedrails?: None Help needed moving from lying on your back to sitting on the side of a flat bed without using bedrails?: None Help needed moving to and from a bed to a chair (including a wheelchair)?: None Help needed standing up from a chair using your arms (e.g., wheelchair or bedside chair)?: None Help needed to walk in hospital room?: A Little Help needed climbing 3-5 steps with a railing? : A Little 6 Click Score: 22    End of Session   Activity Tolerance: Patient tolerated treatment well Patient left: in bed;with call bell/phone within reach;with bed alarm set;with nursing/sitter in room Nurse Communication: Mobility status;Precautions PT Visit Diagnosis: Other abnormalities of gait and mobility (R26.89);Difficulty in walking, not elsewhere classified (R26.2);Muscle weakness (generalized) (M62.81)     Time: 5364-6803 PT Time Calculation (min) (ACUTE ONLY): 26 min  Charges:  $Therapeutic Exercise: 8-22 mins $Therapeutic Activity: 8-22 mins                     Leitha Bleak, PT 01/16/22, 5:12 PM

## 2022-01-16 NOTE — TOC Progression Note (Signed)
Transition of Care [facility]) - Progression Note    Patient Details  Name: Christine [name] MRN: [id number] Date of Birth: Mar 14, 1946  [facility] of [facility]) CM/SW Contact  Laurena Slimmer, RN Phone Number: 01/16/2022, 4:11 PM  Clinical Narrative:    Damaris Schooner with [name], patient's son regarding discharge plan. He is aware patient will require 24/7 supervision however does not have a plan for her to return home. He stated  he would call back tomorrow with a plan to care for the patient upon her return home. The number listed is the best number to reach him.         Expected Discharge Plan and Services                                                 Social Determinants of Health (SDOH) Interventions    Readmission Risk Interventions     No data to display

## 2022-01-16 NOTE — Plan of Care (Signed)

## 2022-01-16 NOTE — TOC Progression Note (Signed)
Transition of Care [facility]) - Progression Note    Patient Details  Name: Christine [name] MRN: [id number] Date of Birth: September 14, 1945  Transition of Care [facility]) CM/SW Contact  Laurena Slimmer, RN Phone Number: 01/16/2022, 4:39 PM  Clinical Narrative:     [place] PASSR obtained. FL2 completed. Bed search initiated.         Expected Discharge Plan and Services                                                 Social Determinants of Health (SDOH) Interventions    Readmission Risk Interventions     No data to display

## 2022-01-16 NOTE — TOC Progression Note (Addendum)
Transition of Care [facility]) - Progression Note    Patient Details  Name: Christine [name] MRN: [id number] Date of Birth: 1946/02/27  Transition of Care [facility]) CM/SW Contact  Laurena Slimmer, RN Phone Number: 01/16/2022, 2:02 PM  Clinical Narrative:    11:18am Patient's insurance will not cover ALF. Attempt to reach patient's Son, [name] regarding discharge plan.No answer. Retrieved a message from patient's son stating he would be available in approx one hour. Re-attempted patient's son, no answer.    2:24pm Attempt to reach patient's Niece, Maudie Mercury. No answer. Unable to leave a  message.          Expected Discharge Plan and Services                                                 Social Determinants of Health (SDOH) Interventions    Readmission Risk Interventions     No data to display

## 2022-01-16 NOTE — Progress Notes (Signed)
Occupational Therapy Treatment Patient Details Name: Christine [name] MRN: [id number] DOB: 08-24-1945 Today's Date: 01/16/2022   History of present illness 76 year old female with history of HFrEF (LVEF 25-30%, G1 DD, LV apical segment akinesis 11/2021) with an abandoned ICD for several years, CAD s/p multiple stents 2006, PAD with recent admission in June 2023 with critical limb ischemia s/p distal abdominal aorta reconstruction, left external iliac artery & left common iliac artery stenting and history of renal artery stent 02/2019, left breast cancer s/p mastectomy with metastases to the lungs with large necrotic left chest wall wound, hypertension, hyperlipidemia, ongoing tobacco use who presents to [facility] ED 01/14/2022 with visual hallucinations, dyspnea on exertion and bilateral lower extremity swelling x1 month   OT comments   Ms. Goodreau is able to move without physical assistance from therapist today -- engaging in bed mobility, transfers, ambulation, grooming in standing, LB dressing, self-feeding, with SUPV for safety. She has one LOB in standing, from which she is able to self-correct. She appears to be physically able to manage her ADL tasks safely. She continues to display a good deal of confusion, however. Her conversation is tangential, and she has difficulty understanding and responding to questions. For example, asked why she was here at the hospital, she responds, "They had to take out the bad blood and put in good." She has a sore on her leg, and she states that this is where the took the blood out. Asked what she would do at home if she had a problem, such as a fall or a fire, she responds, "I could call 911 and they could come and take the bad person away and tell them they are not welcome here." She states that she believes the various individuals who come into the room Freight forwarder, nurse, dietary, etc.) are "spying on her" and that they are "using a camera to take pictures of me to send to my son,  so that he can look me up in [place]." Provided educ re: safety at home, improved caloric intake. Recommend a psych consult.    Recommendations for follow up therapy are one component of a multi-disciplinary discharge planning process, led by the attending physician.  Recommendations may be updated based on patient status, additional functional criteria and insurance authorization.    Follow Up Recommendations  Home health OT    Assistance Recommended at Discharge Frequent or constant Supervision/Assistance  Patient can return home with the following  A little help with walking and/or transfers;A little help with bathing/dressing/bathroom;Help with stairs or ramp for entrance;Direct supervision/assist for medications management;Direct supervision/assist for financial management;Assistance with cooking/housework   Equipment Recommendations  None recommended by OT    Recommendations for Other Services Other (comment) (geri-psych consult)    Precautions / Restrictions Precautions Precautions: Fall Restrictions Weight Bearing Restrictions: No       Mobility Bed Mobility Overal bed mobility: Modified Independent             General bed mobility comments: no physical assistance provided; increased time and effort    Transfers Overall transfer level: Needs assistance Equipment used: Rolling walker (2 wheels) Transfers: Sit to/from Stand Sit to Stand: Min guard           General transfer comment: 1 posterior LOB while standing at sink; pt able to self-correct     Balance Overall balance assessment: Needs assistance Sitting-balance support: Feet supported, Bilateral upper extremity supported Sitting balance-Leahy Scale: Good     Standing balance support: During functional activity,  Single extremity supported Standing balance-Leahy Scale: Fair Standing balance comment: able to stand at sink and perform grooming tasks, using sink cabinet PRN for balance support                            ADL either performed or assessed with clinical judgement   ADL Overall ADL's : Needs assistance/impaired Eating/Feeding: Minimal assistance Eating/Feeding Details (indicate cue type and reason): requires assistance opening all containers Grooming: Wash/dry hands;Wash/dry face;Oral care;Standing;Set up;Supervision/safety               Lower Body Dressing: Modified independent;Sitting/lateral leans Lower Body Dressing Details (indicate cue type and reason): donning socks                    Extremity/Trunk Assessment Upper Extremity Assessment Upper Extremity Assessment: Overall WFL for tasks assessed   Lower Extremity Assessment Lower Extremity Assessment: Generalized weakness   Cervical / Trunk Assessment Cervical / Trunk Assessment: Normal    Vision       Perception     Praxis      Cognition Arousal/Alertness: Awake/alert Behavior During Therapy: WFL for tasks assessed/performed Overall Cognitive Status: No family/caregiver present to determine baseline cognitive functioning                                 General Comments: Oriented to self, location, date. Confusion throughout conversation, anxiety, paranoia, impaired STM        Exercises Other Exercises Other Exercises: Educ re: home safety plans, routines modifications, nutrition    Shoulder Instructions       General Comments      Pertinent Vitals/ Pain       Pain Assessment Pain Assessment: No/denies pain  Home Living                                          Prior Functioning/Environment              Frequency  Min 2X/week        Progress Toward Goals  OT Goals(current goals can now be found in the care plan section)  Progress towards OT goals: Progressing toward goals  Acute Rehab OT Goals OT Goal Formulation: With patient Time For Goal Achievement: 01/29/22 Potential to Achieve Goals: Good  Plan  Discharge plan remains appropriate;Frequency remains appropriate    Co-evaluation                 AM-PAC OT "6 Clicks" Daily Activity     Outcome Measure   Help from another person eating meals?: None Help from another person taking care of personal grooming?: None Help from another person toileting, which includes using toliet, bedpan, or urinal?: A Little Help from another person bathing (including washing, rinsing, drying)?: A Little Help from another person to put on and taking off regular upper body clothing?: None Help from another person to put on and taking off regular lower body clothing?: None 6 Click Score: 22    End of Session Equipment Utilized During Treatment: Rolling walker (2 wheels)  OT Visit Diagnosis: Unsteadiness on feet (R26.81);Muscle weakness (generalized) (M62.81)   Activity Tolerance Patient tolerated treatment well   Patient Left in bed;with call bell/phone within reach;with bed alarm set;with nursing/sitter in room   Nurse  Communication          Time: 3845-3646 OT Time Calculation (min): 29 min  Charges: OT General Charges $OT Visit: 1 Visit OT Treatments $Self Care/Home Management : 23-37 mins  Josiah Lobo, PhD, MS, OTR/L 01/16/22, 1:49 PM

## 2022-01-17 DIAGNOSIS — I5043 Acute on chronic combined systolic (congestive) and diastolic (congestive) heart failure: Secondary | ICD-10-CM | POA: Diagnosis not present

## 2022-01-17 LAB — AEROBIC CULTURE W GRAM STAIN (SUPERFICIAL SPECIMEN): Gram Stain: NONE SEEN

## 2022-01-17 LAB — CBC
HCT: 35.2 % — ABNORMAL LOW (ref 36.0–46.0)
Hemoglobin: 11.3 g/dL — ABNORMAL LOW (ref 12.0–15.0)
MCH: 28.4 pg (ref 26.0–34.0)
MCHC: 32.1 g/dL (ref 30.0–36.0)
MCV: 88.4 fL (ref 80.0–100.0)
Platelets: 127 10*3/uL — ABNORMAL LOW (ref 150–400)
RBC: 3.98 MIL/uL (ref 3.87–5.11)
RDW: 14.7 % (ref 11.5–15.5)
WBC: 10.3 10*3/uL (ref 4.0–10.5)
nRBC: 0 % (ref 0.0–0.2)

## 2022-01-17 LAB — BASIC METABOLIC PANEL
Anion gap: 8 (ref 5–15)
BUN: 31 mg/dL — ABNORMAL HIGH (ref 8–23)
CO2: 27 mmol/L (ref 22–32)
Calcium: 8.5 mg/dL — ABNORMAL LOW (ref 8.9–10.3)
Chloride: 104 mmol/L (ref 98–111)
Creatinine, Ser: 0.98 mg/dL (ref 0.44–1.00)
GFR, Estimated: >60 mL/min (ref >60)
Glucose, Bld: 130 mg/dL — ABNORMAL HIGH (ref 70–99)
Potassium: 3.6 mmol/L (ref 3.5–5.1)
Sodium: 139 mmol/L (ref 135–145)

## 2022-01-17 LAB — MAGNESIUM: Magnesium: 2.4 mg/dL (ref 1.7–2.4)

## 2022-01-17 MED ORDER — CARVEDILOL 6.25 MG PO TABS
6.2500 mg | ORAL_TABLET | Freq: Two times a day (BID) | ORAL | Status: DC
  Administered 2022-01-17 – 2022-01-19 (×4): 6.25 mg via ORAL
  Filled 2022-01-17 (×4): qty 1

## 2022-01-17 MED ORDER — CIPROFLOXACIN HCL 500 MG PO TABS
250.0000 mg | ORAL_TABLET | Freq: Two times a day (BID) | ORAL | Status: DC
  Administered 2022-01-18 – 2022-01-21 (×7): 250 mg via ORAL
  Filled 2022-01-17 (×8): qty 0.5

## 2022-01-17 NOTE — Care Management Important Message (Signed)
Important Message  Patient Details  Name: Christine [name] MRN: [id number] Date of Birth: 08-10-1945   Medicare Important Message Given:  Other (see comment)  Tried to reach the patient's son x2, Carroll Kinds [phone] to review the Important Message from Medicare with him but I received a message that the call cannot be completed at this time.  Will try again on Monday.   Juliann Pulse A Erisa Mehlman 01/17/2022, 3:05 PM

## 2022-01-17 NOTE — TOC Progression Note (Signed)
Transition of Care [facility]) - Progression Note    Patient Details  Name: Christine [name] MRN: [id number] Date of Birth: 1945-12-05  Transition of [facility]) CM/SW Contact  Laurena Slimmer, RN Phone Number: 01/17/2022, 11:02 AM  Clinical Narrative:    Attempt to reach patient's son [name] @ [phone]. Recorded message states "The phone you are trying to call is not reachable." Unable to leave a message. Attempt to reach patient's niece, Tammy @ [phone], No answer. Unable to leave a message.          Expected Discharge Plan and Services                                                 Social Determinants of Health (SDOH) Interventions    Readmission Risk Interventions     No data to display

## 2022-01-17 NOTE — Progress Notes (Signed)
Dr. Billie Ruddy notified of SBP in the 102s and 80s.

## 2022-01-17 NOTE — Progress Notes (Signed)
Physical Therapy Treatment Patient Details Name: Christine [name] MRN: [id number] DOB: 1945/06/28 Today's Date: 01/17/2022   History of Present Illness 76 year old female with history of HFrEF (LVEF 25-30%, G1 DD, LV apical segment akinesis 11/2021) with an abandoned ICD for several years, CAD s/p multiple stents 2006, PAD with recent admission in June 2023 with critical limb ischemia s/p distal abdominal aorta reconstruction, left external iliac artery & left common iliac artery stenting and history of renal artery stent 02/2019, left breast cancer s/p mastectomy with metastases to the lungs with large necrotic left chest wall wound, hypertension, hyperlipidemia, ongoing tobacco use who presents to [facility] ED 01/14/2022 with visual hallucinations, dyspnea on exertion and bilateral lower extremity swelling x1 month    PT Comments    Pt resting in bed upon PT arrival; pt requiring some encouragement to participate in therapy activities but eventually agreeable to taking a walk.  During session pt modified independent with bed mobility; SBA with transfers using RW; and CGA progressing to SBA ambulating 160 feet with RW use.  No loss of balance noted during sessions activities.  Will continue to focus on strengthening and progressive functional mobility during hospitalization.    Recommendations for follow up therapy are one component of a multi-disciplinary discharge planning process, led by the attending physician.  Recommendations may be updated based on patient status, additional functional criteria and insurance authorization.  Follow Up Recommendations  Other (comment) (24/7 supervision/assistance)     Assistance Recommended at Discharge Frequent or constant Supervision/Assistance  Patient can return home with the following A little help with bathing/dressing/bathroom;Assistance with cooking/housework;A little help with walking and/or transfers;Direct supervision/assist for financial management;Assist  for transportation;Help with stairs or ramp for entrance;Direct supervision/assist for medications management   Equipment Recommendations  BSC/3in1;Rolling walker (2 wheels)    Recommendations for Other Services       Precautions / Restrictions Precautions Precautions: Fall Restrictions Weight Bearing Restrictions: No     Mobility  Bed Mobility Overal bed mobility: Modified Independent             General bed mobility comments: semi-supine to/from sitting edge of bed without any noted difficulties    Transfers Overall transfer level: Needs assistance Equipment used: Rolling walker (2 wheels) Transfers: Sit to/from Stand Sit to Stand: Supervision   Step pivot transfers: Supervision       General transfer comment: fairly strong stand up to RW; steady    Ambulation/Gait Ambulation/Gait assistance: Min guard, Supervision Gait Distance (Feet): 160 Feet Assistive device: Rolling walker (2 wheels) Gait Pattern/deviations: Step-through pattern Gait velocity: mildly decreased     General Gait Details: mildly more narrow BOS; steady with RW use; initial cueing to stay inside RW with turns   Marine scientist [place] (Stroke Patients Only)       Balance Overall balance assessment: Needs assistance Sitting-balance support: Feet supported, No upper extremity supported Sitting balance-Leahy Scale: Good Sitting balance - Comments: steady sitting reaching within BOS   Standing balance support: Bilateral upper extremity supported, During functional activity Standing balance-Leahy Scale: Good Standing balance comment: steady ambulating with RW use                            Cognition Arousal/Alertness: Awake/alert Behavior During Therapy: Restless Overall Cognitive Status: No family/caregiver present to determine baseline cognitive functioning  General Comments:  Oriented to at least self and location; appearing confused        Exercises      General Comments  Nursing cleared pt for participation in physical therapy.  Pt agreeable to PT session.      Pertinent Vitals/Pain Pain Assessment Pain Assessment: Faces Faces Pain Scale: Hurts a little bit (2/10 at rest; 4/10 with activity) Pain Location: L LE > RLE, feet Pain Descriptors / Indicators: Discomfort, Tender Pain Intervention(s): Limited activity within patient's tolerance, Monitored during session, Repositioned Vitals (HR and O2 on room air) stable and WFL throughout treatment session.    Home Living                          Prior Function            PT Goals (current goals can now be found in the care plan section) Acute Rehab PT Goals Patient Stated Goal: to feel better PT Goal Formulation: With patient Time For Goal Achievement: 01/29/22 Potential to Achieve Goals: Good Progress towards PT goals: Progressing toward goals    Frequency    Min 2X/week      PT Plan Current plan remains appropriate    Co-evaluation              AM-PAC PT "6 Clicks" Mobility   Outcome Measure  Help needed turning from your back to your side while in a flat bed without using bedrails?: None Help needed moving from lying on your back to sitting on the side of a flat bed without using bedrails?: None Help needed moving to and from a bed to a chair (including a wheelchair)?: None Help needed standing up from a chair using your arms (e.g., wheelchair or bedside chair)?: None Help needed to walk in hospital room?: A Little Help needed climbing 3-5 steps with a railing? : A Little 6 Click Score: 22    End of Session Equipment Utilized During Treatment: Gait belt Activity Tolerance: Patient tolerated treatment well Patient left: in bed;with call bell/phone within reach;with nursing/sitter in room Nurse Communication: Mobility status;Precautions PT Visit Diagnosis: Other  abnormalities of gait and mobility (R26.89);Difficulty in walking, not elsewhere classified (R26.2);Muscle weakness (generalized) (M62.81)     Time: 4287-6811 PT Time Calculation (min) (ACUTE ONLY): 11 min  Charges:  $Therapeutic Activity: 8-22 mins                     Leitha Bleak, PT 01/17/22, 12:47 PM

## 2022-01-17 NOTE — Progress Notes (Signed)
PROGRESS NOTE    Christine [name]  YPP:[id number] DOB: 05/31/1946 DOA: 01/14/2022 PCP: Baxter Hire, MD  235A/[id number]-AA  LOS: 3 days   Brief hospital course: No notes on file  Assessment & Plan: Christine [name] is a 76 y.o. female with medical history significant of CHF with EF 25-30, hypertension, hyperlipidemia, COPD, anxiety, CAD, stent placement, ICD placement, breast cancer with chronic left chest wound, AAA, thrombocytopenia, cognitive decline, PVD and recent admission for critical left lower extremity ischemia (s/p of reconstruction byb VVS), CKD-3a, who presents with shortness breath and visual hallucination.   * Acute on chronic combined systolic and diastolic CHF (congestive heart failure) ([place]) Patient has shortness of breath, and crackles on auscultation, positive JVD, elevated BNP 2034, clinically consistent with CHF exacerbation.  2D echo on 12/04/2021 showed EF 25-30% with grade 1 diastolic dysfunction.  Consulted Dr. Nehemiah Massed of cardiology. --received IV lasix 40 mg x2 and IV lasix 20 mg x2 Plan: --cont oral lasix 20 mg daily  --reduce coreg further to 6.25 mg BID due to low BP -Hold Irbesartan due to hypotension   Open wound_left chest wall, chronic --wound care consulted, wound has not changed in appearance.   --wound cx obtained on presentation pos for enterobacter and klebsiella --started on ceftriaxone on presentation Plan: --transition to Cipro tomorrow, per ID pharm rec (since enterobacter can become resistant to po b-lactams).   CAD (coronary artery disease) -Continue aspirin and Plavix daily --cont statin  Acute metabolic encephalopathy CT head negative.  Unclear etiology.   --delirium vs underlying dementia.   Plan: --sitter at bedside --seroquel 25 mg nightly --IV hadol PRN for agitation  Chronic obstructive pulmonary disease ([place]) --stable  Essential hypertension --cong coreg, lasix  --hold Imdur due to hypotension  HLD (hyperlipidemia) -  Crestor  Thrombocytopenia (HCC) This is a chronic issue.  Platelet 86.  No active bleeding -Follow-up with CBC  Anxiety --avoid benzo due to delirium --IV haldol PRN  PAD (peripheral artery disease) (HCC) - Aspirin, Plavix, Crestor  Underweight --supplements per dietician    History of breast cancer - Continue tamoxifen  Chronic kidney disease, stage 3a (HCC) Stable  UTI, ruled out --urine cx no growth --d/c'ed ceftriaxone  Malnutrition, ruled out --pt was seen by dietician, not dx with malnutrition   DVT prophylaxis: Lovenox SQ Code Status: DNR  Family Communication:  Level of care: Telemetry Cardiac Dispo:   The patient is from: home Anticipated d/c is to: undetermined Anticipated d/c date is: undetermined Patient currently is not medically ready to d/c due to: unsafe to be at home by herself   Subjective and Interval History:  Pt walked with PT today.  Later, BP found to be low.     Objective: Vitals:   01/17/22 0323 01/17/22 0718 01/17/22 1243 01/17/22 1409  BP: 108/72 99/63 (!) 89/47 (!) 88/54  Pulse: 72 79 65 67  Resp: '17 17 17 17  '$ Temp: (!) 97.5 F (36.4 C) 99 F (37.2 C) 98.2 F (36.8 C) 98.1 F (36.7 C)  TempSrc: Oral     SpO2: 99% 97% 99% 97%  Weight:      Height:        Intake/Output Summary (Last 24 hours) at 01/17/2022 1757 Last data filed at 01/17/2022 1410 Gross per 24 hour  Intake 120 ml  Output 501 ml  Net -381 ml   Filed Weights   01/13/22 2313 01/15/22 0408 01/16/22 0500  Weight: 36.3 kg 31.5 kg 33.1 kg    Examination:  Constitutional: NAD, alert, oriented to self and hospital HEENT: conjunctivae and lids normal, EOMI CV: No cyanosis.   RESP: normal respiratory effort, on RA Extremities: No effusions, edema in BLE SKIN: warm, dry Neuro: II - XII grossly intact.     Data Reviewed: I have personally reviewed labs and imaging studies  Time spent: 35 minutes  Christine Bi, MD [facility] If 7PM-7AM, please  contact night-coverage 01/17/2022, 5:57 PM

## 2022-01-17 NOTE — Progress Notes (Signed)
OT Cancellation Note  Patient Details Name: Christine [name] MRN: [id number] DOB: Feb 02, 1946   Cancelled Treatment:    Reason Eval/Treat Not Completed: Patient declined, no reason specified. Chart revived. Patient attempted x2 this AM, first attempt nursing completing care and second attempt pt sleeping soundly, awakes to touch but unable to stay awake for meaningful session. Will hold and re-attempt as available.   Dessie Coma, M.S. OTR/L  01/17/22, 12:47 PM  ascom [phone]

## 2022-01-18 DIAGNOSIS — I5043 Acute on chronic combined systolic (congestive) and diastolic (congestive) heart failure: Secondary | ICD-10-CM | POA: Diagnosis not present

## 2022-01-18 LAB — CBC
HCT: 36.8 % (ref 36.0–46.0)
Hemoglobin: 11.6 g/dL — ABNORMAL LOW (ref 12.0–15.0)
MCH: 28.2 pg (ref 26.0–34.0)
MCHC: 31.5 g/dL (ref 30.0–36.0)
MCV: 89.5 fL (ref 80.0–100.0)
Platelets: 124 10*3/uL — ABNORMAL LOW (ref 150–400)
RBC: 4.11 MIL/uL (ref 3.87–5.11)
RDW: 14.9 % (ref 11.5–15.5)
WBC: 8.4 10*3/uL (ref 4.0–10.5)
nRBC: 0 % (ref 0.0–0.2)

## 2022-01-18 LAB — BASIC METABOLIC PANEL
Anion gap: 10 (ref 5–15)
BUN: 35 mg/dL — ABNORMAL HIGH (ref 8–23)
CO2: 25 mmol/L (ref 22–32)
Calcium: 8.6 mg/dL — ABNORMAL LOW (ref 8.9–10.3)
Chloride: 104 mmol/L (ref 98–111)
Creatinine, Ser: 1.06 mg/dL — ABNORMAL HIGH (ref 0.44–1.00)
GFR, Estimated: 55 mL/min — ABNORMAL LOW (ref >60)
Glucose, Bld: 141 mg/dL — ABNORMAL HIGH (ref 70–99)
Potassium: 4.2 mmol/L (ref 3.5–5.1)
Sodium: 139 mmol/L (ref 135–145)

## 2022-01-18 LAB — MAGNESIUM: Magnesium: 2.4 mg/dL (ref 1.7–2.4)

## 2022-01-18 NOTE — TOC Progression Note (Addendum)
Transition of Care [facility]) - Progression Note    Patient Details  Name: Christine [name] MRN: [id number] Date of Birth: Aug 01, 1945  Transition of [facility]) CM/SW [place], LCSW Phone Number: 01/18/2022, 10:51 AM  Clinical Narrative: Tried calling son to give bed offers. Unable to leave a voicemail.  11:36 am: Received call back from son. Gave bed offers. He said he wants to check something and will call back in about 15 minutes.  12:08 pm: Son has accepted bed offer from [facility]. If insurance won't cover rehab, he wants her to go for LTC placement. Left message for admissions coordinator to let her know and see if they have any LTC beds.  12:24 pm: [facility] for rehab. Admissions coordinator will check on LTC bed availability if needed.  Expected Discharge Plan and Services                                                 Social Determinants of Health (SDOH) Interventions    Readmission Risk Interventions     No data to display

## 2022-01-18 NOTE — Progress Notes (Signed)
PROGRESS NOTE    Christine [name]  ZCH:[id number] DOB: 09-Oct-1945 DOA: 01/14/2022 PCP: Baxter Hire, MD  235A/[id number]-AA  LOS: 4 days   Brief hospital course: No notes on file  Assessment & Plan: Christine [name] is a 76 y.o. female with medical history significant of CHF with EF 25-30, hypertension, hyperlipidemia, COPD, anxiety, CAD, stent placement, ICD placement, breast cancer with chronic left chest wound, AAA, thrombocytopenia, cognitive decline, PVD and recent admission for critical left lower extremity ischemia (s/p of reconstruction byb VVS), CKD-3a, who presents with shortness breath and visual hallucination.   * Acute on chronic combined systolic and diastolic CHF (congestive heart failure) ([place]) Patient has shortness of breath, and crackles on auscultation, positive JVD, elevated BNP 2034, clinically consistent with CHF exacerbation.  2D echo on 12/04/2021 showed EF 25-30% with grade 1 diastolic dysfunction.  Consulted Dr. Nehemiah Massed of cardiology. --received IV lasix 40 mg x2 and IV lasix 20 mg x2 Plan: --cont oral lasix 20 mg daily  --cont coreg at reduced 6.25 mg BID due to low BP -Hold Irbesartan due to hypotension   Open wound_left chest wall, chronic --wound care consulted, wound has not changed in appearance.   --wound cx obtained on presentation pos for enterobacter and klebsiella --started on ceftriaxone on presentation, transitioned to Cipro, per ID pharm rec (since enterobacter can become resistant to po b-lactams). Plan: --cont Cipro   CAD (coronary artery disease) -Continue aspirin and Plavix daily --cont statin  Acute metabolic encephalopathy CT head negative.  Unclear etiology.   --delirium vs underlying dementia.   Plan: --sitter at bedside --seroquel 25 mg nightly --IV hadol PRN for agitation  Chronic obstructive pulmonary disease ([place]) --stable  Essential hypertension --cong coreg, lasix  --hold Imdur due to hypotension  HLD (hyperlipidemia) -  Crestor  Thrombocytopenia (HCC) This is a chronic issue.  Platelet 86.  No active bleeding -Follow-up with CBC  Anxiety --avoid benzo due to delirium --IV haldol PRN  PAD (peripheral artery disease) (HCC) - Aspirin, Plavix, Crestor  Underweight --supplements per dietician    History of breast cancer - Continue tamoxifen  Chronic kidney disease, stage 3a (HCC) Stable  UTI, ruled out --urine cx no growth --d/c'ed ceftriaxone  Malnutrition, ruled out --pt was seen by dietician, not dx with malnutrition   DVT prophylaxis: Lovenox SQ Code Status: DNR  Family Communication:  Level of care: Telemetry Cardiac Dispo:   The patient is from: home Anticipated d/c is to: undetermined Anticipated d/c date is: undetermined Patient currently is not medically ready to d/c due to: unsafe to be at home by herself   Subjective and Interval History:  Pt complained of her left foot hurting.   Objective: Vitals:   01/18/22 0349 01/18/22 0430 01/18/22 0715 01/18/22 1510  BP: 108/63  122/64 (!) 112/59  Pulse: 71  72 73  Resp: '16  17 18  '$ Temp: 98.2 F (36.8 C)  98 F (36.7 C) 97.7 F (36.5 C)  TempSrc:      SpO2: 96%  97% 99%  Weight:  31.7 kg    Height:        Intake/Output Summary (Last 24 hours) at 01/18/2022 1851 Last data filed at 01/18/2022 1000 Gross per 24 hour  Intake 237 ml  Output --  Net 237 ml   Filed Weights   01/15/22 0408 01/16/22 0500 01/18/22 0430  Weight: 31.5 kg 33.1 kg 31.7 kg    Examination:   Constitutional: NAD, alert, oriented to self and hospital, sitting at  edge of bed HEENT: conjunctivae and lids normal, EOMI CV: No cyanosis.   RESP: normal respiratory effort, on RA Extremities: No effusions, edema in BLE SKIN: warm, dry Neuro: II - XII grossly intact.     Data Reviewed: I have personally reviewed labs and imaging studies  Time spent: 25 minutes  Enzo Bi, MD [facility] If 7PM-7AM, please contact  night-coverage 01/18/2022, 6:51 PM

## 2022-01-19 DIAGNOSIS — I5043 Acute on chronic combined systolic (congestive) and diastolic (congestive) heart failure: Secondary | ICD-10-CM | POA: Diagnosis not present

## 2022-01-19 LAB — BASIC METABOLIC PANEL
Anion gap: 9 (ref 5–15)
BUN: 44 mg/dL — ABNORMAL HIGH (ref 8–23)
CO2: 26 mmol/L (ref 22–32)
Calcium: 8.6 mg/dL — ABNORMAL LOW (ref 8.9–10.3)
Chloride: 104 mmol/L (ref 98–111)
Creatinine, Ser: 1.1 mg/dL — ABNORMAL HIGH (ref 0.44–1.00)
GFR, Estimated: 53 mL/min — ABNORMAL LOW (ref >60)
Glucose, Bld: 113 mg/dL — ABNORMAL HIGH (ref 70–99)
Potassium: 4.6 mmol/L (ref 3.5–5.1)
Sodium: 139 mmol/L (ref 135–145)

## 2022-01-19 LAB — CBC
HCT: 35.8 % — ABNORMAL LOW (ref 36.0–46.0)
Hemoglobin: 11.1 g/dL — ABNORMAL LOW (ref 12.0–15.0)
MCH: 28.3 pg (ref 26.0–34.0)
MCHC: 31 g/dL (ref 30.0–36.0)
MCV: 91.3 fL (ref 80.0–100.0)
Platelets: 119 10*3/uL — ABNORMAL LOW (ref 150–400)
RBC: 3.92 MIL/uL (ref 3.87–5.11)
RDW: 14.8 % (ref 11.5–15.5)
WBC: 7.7 10*3/uL (ref 4.0–10.5)
nRBC: 0 % (ref 0.0–0.2)

## 2022-01-19 LAB — CULTURE, BLOOD (ROUTINE X 2)
Culture: NO GROWTH
Culture: NO GROWTH
Special Requests: ADEQUATE

## 2022-01-19 LAB — MAGNESIUM: Magnesium: 2.5 mg/dL — ABNORMAL HIGH (ref 1.7–2.4)

## 2022-01-19 MED ORDER — CARVEDILOL 3.125 MG PO TABS
3.1250 mg | ORAL_TABLET | Freq: Two times a day (BID) | ORAL | Status: DC
  Administered 2022-01-19 – 2022-01-22 (×6): 3.125 mg via ORAL
  Filled 2022-01-19 (×6): qty 1

## 2022-01-19 NOTE — Progress Notes (Signed)
PROGRESS NOTE    Christine [name]  ONG:[id number] DOB: 04/22/1946 DOA: 01/14/2022 PCP: Baxter Hire, MD  235A/235A-AA  LOS: 5 days   Brief hospital course: No notes on file  Assessment & Plan: Christine [name] is a 76 y.o. female with medical history significant of CHF with EF 25-30, hypertension, hyperlipidemia, COPD, anxiety, CAD, stent placement, ICD placement, breast cancer with chronic left chest wound, AAA, thrombocytopenia, cognitive decline, PVD and recent admission for critical left lower extremity ischemia (s/p of reconstruction byb VVS), CKD-3a, who presents with shortness breath and visual hallucination.   * Acute on chronic combined systolic and diastolic CHF (congestive heart failure) ([place]) Patient has shortness of breath, and crackles on auscultation, positive JVD, elevated BNP 2034, clinically consistent with CHF exacerbation.  2D echo on 12/04/2021 showed EF 25-30% with grade 1 diastolic dysfunction.  Consulted Dr. Nehemiah Massed of cardiology. --received IV lasix 40 mg x2 and IV lasix 20 mg x2 Plan: --cont oral lasix 20 mg daily  --reduce coreg further to 3.125 mg BID due to low BP -Hold Irbesartan due to hypotension   Open wound_left chest wall, chronic --wound care consulted, wound has not changed in appearance.   --wound cx obtained on presentation pos for enterobacter and klebsiella --started on ceftriaxone on presentation, transitioned to Cipro, per ID pharm rec (since enterobacter can become resistant to po b-lactams). Plan: --cont Cipro   CAD (coronary artery disease) -Continue aspirin and Plavix daily --cont statin  Acute metabolic encephalopathy CT head negative.  Unclear etiology.   --delirium vs underlying dementia.   Plan: --sitter at bedside --seroquel 25 mg nightly --IV hadol PRN for agitation  Chronic obstructive pulmonary disease ([place]) --stable  Essential hypertension --cong coreg, lasix  --hold Imdur due to hypotension  HLD  (hyperlipidemia) - Crestor  Thrombocytopenia (HCC) This is a chronic issue.  Platelet 86.  No active bleeding -Follow-up with CBC  Anxiety --avoid benzo due to delirium --IV haldol PRN  PAD (peripheral artery disease) (HCC) - Aspirin, Plavix, Crestor  Underweight --supplements per dietician    History of breast cancer - Continue tamoxifen  Chronic kidney disease, stage 3a (HCC) Stable  UTI, ruled out --urine cx no growth --d/c'ed ceftriaxone  Malnutrition, ruled out --pt was seen by dietician, not dx with malnutrition   DVT prophylaxis: Lovenox SQ Code Status: DNR  Family Communication:  Level of care: Telemetry Cardiac Dispo:   The patient is from: home Anticipated d/c is to: undetermined Anticipated d/c date is: undetermined Patient currently is not medically ready to d/c due to: unsafe to be at home by herself   Subjective and Interval History:  Pt reported feeling good today, for the first time.   Objective: Vitals:   01/19/22 0412 01/19/22 0755 01/19/22 1343 01/19/22 1559  BP: (!) 111/55 (!) 103/55 (!) 102/59 130/63  Pulse: 66 64 70 71  Resp: '17 16 16 16  '$ Temp: 98.7 F (37.1 C) 98.3 F (36.8 C) 98.2 F (36.8 C) 98 F (36.7 C)  TempSrc: Oral Oral  Oral  SpO2: 98% 99% 99% 98%  Weight:      Height:       No intake or output data in the 24 hours ending 01/19/22 1642  Filed Weights   01/15/22 0408 01/16/22 0500 01/18/22 0430  Weight: 31.5 kg 33.1 kg 31.7 kg    Examination:   Constitutional: NAD, alert, oriented to person and place HEENT: conjunctivae and lids normal, EOMI CV: No cyanosis.   RESP: normal respiratory effort,  on RA Neuro: II - XII grossly intact.     Data Reviewed: I have personally reviewed labs and imaging studies  Time spent: 25 minutes  Enzo Bi, MD [facility] If 7PM-7AM, please contact night-coverage 01/19/2022, 4:42 PM

## 2022-01-19 NOTE — Plan of Care (Signed)
Pt AAOx3, reports chronic pain in bilateral feet. VS are stable. Plan for mobility and pain control as tolerated. Bed in lowest position, call light within reach. Sitter at bedside. Will continue to monitor.

## 2022-01-19 NOTE — TOC Progression Note (Signed)
Transition of Care [facility]) - Progression Note    Patient Details  Name: Christine [name] MRN: [id number] Date of Birth: 23-Jan-1946  [facility] CM/SW Contact  Izola Price, RN Phone Number: 01/19/2022, 10:09 AM  Clinical Narrative:   8/6: Insurance authorization remains pending in [place] portal as of 1000 am today. Simmie Davies RN CM          Expected Discharge Plan and Services                                                 Social Determinants of Health (SDOH) Interventions    Readmission Risk Interventions     No data to display

## 2022-01-20 DIAGNOSIS — I5043 Acute on chronic combined systolic (congestive) and diastolic (congestive) heart failure: Secondary | ICD-10-CM | POA: Diagnosis not present

## 2022-01-20 NOTE — Progress Notes (Signed)
Occupational Therapy Treatment Patient Details Name: Christine [name] MRN: [id number] DOB: [birth year] Today's Date: 01/20/2022   History of present illness 76 year old female with history of HFrEF (LVEF 25-30%, G1 DD, LV apical segment akinesis 11/2021) with an abandoned ICD for several years, CAD s/p multiple stents 2006, PAD with recent admission in June 2023 with critical limb ischemia s/p distal abdominal aorta reconstruction, left external iliac artery & left common iliac artery stenting and history of renal artery stent 02/2019, left breast cancer s/p mastectomy with metastases to the lungs with large necrotic left chest wall wound, hypertension, hyperlipidemia, ongoing tobacco use who presents to [facility] ED 01/14/2022 with visual hallucinations, dyspnea on exertion and bilateral lower extremity swelling x1 month   OT comments  Patient tolerated treatment session well today and is making steady progress toward goals. Based on pt living at home alone independently PTA, OT completed the Pillbox Test this date. This test was developed for measuring executive function through the real-time assessment of medication management, a complex IADL. Pt reporting inability to read medication labels even with reading glasses on. After OT reading labels aloud, pt still unable to accurately fill pill box for a 1 week simulation. Pt unable to generalize activity of filling her own medications at home to this similar pill box layout. Pt reports her medication labels have larger print at home and she has a tool to help her open pill bottles. Overall, pt received a failing score on the test (>3 errors; did not accurately place any medication into pillbox). Based on how pt performed demonstrates the need for 24/7 supervision for all medications. Continue OT POC.    Recommendations for follow up therapy are one component of a multi-disciplinary discharge planning process, led by the attending physician.  Recommendations may be  updated based on patient status, additional functional criteria and insurance authorization.    Follow Up Recommendations  Home health OT    Assistance Recommended at Discharge Frequent or constant Supervision/Assistance  Patient can return home with the following  A little help with walking and/or transfers;A little help with bathing/dressing/bathroom;Help with stairs or ramp for entrance;Direct supervision/assist for medications management;Direct supervision/assist for financial management;Assistance with cooking/housework   Equipment Recommendations  None recommended by OT    Recommendations for Other Services Other (comment) (geri-psych consult)    Precautions / Restrictions Precautions Precautions: Fall Restrictions Weight Bearing Restrictions: No       Mobility Bed Mobility Overal bed mobility: Modified Independent             General bed mobility comments: sit to supine    Transfers Overall transfer level: Needs assistance Equipment used: None Transfers: Sit to/from Stand Sit to Stand: Supervision                 Balance Overall balance assessment: Needs assistance Sitting-balance support: Feet supported, No upper extremity supported Sitting balance-Leahy Scale: Good     Standing balance support: No upper extremity supported Standing balance-Leahy Scale: Good Standing balance comment: stood at EOB                           ADL either performed or assessed with clinical judgement   ADL Overall ADL's : Needs assistance/impaired                     Lower Body Dressing: Modified independent;Sitting/lateral leans Lower Body Dressing Details (indicate cue type and reason): doffing socks  Extremity/Trunk Assessment Upper Extremity Assessment Upper Extremity Assessment: Generalized weakness   Lower Extremity Assessment Lower Extremity Assessment: Generalized weakness   Cervical / Trunk  Assessment Cervical / Trunk Assessment: Normal    Vision Baseline Vision/History: 1 Wears glasses Patient Visual Report: No change from baseline                Cognition Arousal/Alertness: Awake/alert Behavior During Therapy: WFL for tasks assessed/performed Overall Cognitive Status: No family/caregiver present to determine baseline cognitive functioning                                                             Pertinent Vitals/ Pain       Pain Assessment Pain Assessment: No/denies pain                                                          Frequency  Min 2X/week        Progress Toward Goals  OT Goals(current goals can now be found in the care plan section)  Progress towards OT goals: Progressing toward goals  Acute Rehab OT Goals Patient Stated Goal: to go home OT Goal Formulation: With patient Time For Goal Achievement: 01/29/22 Potential to Achieve Goals: Good  Plan Discharge plan remains appropriate;Frequency remains appropriate    AM-PAC OT "6 Clicks" Daily Activity     Outcome Measure   Help from another person eating meals?: None Help from another person taking care of personal grooming?: None Help from another person toileting, which includes using toliet, bedpan, or urinal?: A Little Help from another person bathing (including washing, rinsing, drying)?: A Little Help from another person to put on and taking off regular upper body clothing?: None Help from another person to put on and taking off regular lower body clothing?: None 6 Click Score: 22    End of Session    OT Visit Diagnosis: Unsteadiness on feet (R26.81);Muscle weakness (generalized) (M62.81)   Activity Tolerance Patient tolerated treatment well   Patient Left in bed;with call bell/phone within reach;with bed alarm set;with nursing/sitter in room   Nurse Communication Mobility status        Time: 7371-0626 OT Time  Calculation (min): 19 min  Charges: OT General Charges $OT Visit: 1 Visit OT Treatments $Therapeutic Activity: 8-22 mins  [facility] MS, OTR/L ascom [phone]  01/20/22, 4:18 PM

## 2022-01-20 NOTE — Progress Notes (Signed)
Mobility Specialist - Progress Note    01/20/22 1530  Mobility  Activity Ambulated with assistance in hallway;Stood at bedside;Dangled on edge of bed  Level of Assistance Standby assist, set-up cues, supervision of patient - no hands on  Assistive Device Four wheel walker  Activity Response Tolerated well  $Mobility charge 1 Mobility    Pt EOB on RA upon arrival. Pt STS and ambulates 1 lap around NS SBA. Pt returns to EOB with needs in reach and sitter in room.   Gretchen Short  Mobility Specialist  01/20/22 3:31 PM

## 2022-01-20 NOTE — Progress Notes (Signed)
Mobility Specialist - Progress Note    01/20/22 1322  Mobility  Activity Ambulated with assistance in hallway;Stood at bedside;Dangled on edge of bed  Level of Assistance Standby assist, set-up cues, supervision of patient - no hands on  Assistive Device Front wheel walker  Activity Response Tolerated well  $Mobility charge 1 Mobility    Pt supine in bed on RA upon arrival. Pt STS and ambulates 1 lap around NS SBA. Pt returns to EOB with needs in reach and sitter in room.   Gretchen Short  Mobility Specialist  01/20/22 1:24 PM

## 2022-01-20 NOTE — Care Management Important Message (Signed)
Important Message  Patient Details  Name: Christine [name] MRN: [id number] Date of Birth: [birth year]   Medicare Important Message Given:  Yes     Dannette Barbara 01/20/2022, 12:16 PM

## 2022-01-20 NOTE — Progress Notes (Signed)
Physical Therapy Treatment Patient Details Name: Christine [name] MRN: [id number] DOB: 1946/03/20 Today's Date: 01/20/2022   History of Present Illness 76 year old female with history of HFrEF (LVEF 25-30%, G1 DD, LV apical segment akinesis 11/2021) with an abandoned ICD for several years, CAD s/p multiple stents 2006, PAD with recent admission in June 2023 with critical limb ischemia s/p distal abdominal aorta reconstruction, left external iliac artery & left common iliac artery stenting and history of renal artery stent 02/2019, left breast cancer s/p mastectomy with metastases to the lungs with large necrotic left chest wall wound, hypertension, hyperlipidemia, ongoing tobacco use who presents to [facility] ED 01/14/2022 with visual hallucinations, dyspnea on exertion and bilateral lower extremity swelling x1 month    PT Comments    Pt resting in bed upon PT arrival; sitter present; pt agreeable to ambulation.  During session pt modified independent semi-supine to sitting edge of bed; SBA with transfers; and CGA to SBA ambulating 200 feet x2 with RW.  Overall pt steady with functional mobility using RW.  Will continue to focus on balance, strengthening, and overall functional mobility during hospitalization.    Recommendations for follow up therapy are one component of a multi-disciplinary discharge planning process, led by the attending physician.  Recommendations may be updated based on patient status, additional functional criteria and insurance authorization.  Follow Up Recommendations  Other (comment) (24/7 supervision/assistance)     Assistance Recommended at Discharge Frequent or constant Supervision/Assistance  Patient can return home with the following A little help with bathing/dressing/bathroom;Assistance with cooking/housework;A little help with walking and/or transfers;Direct supervision/assist for financial management;Assist for transportation;Help with stairs or ramp for entrance;Direct  supervision/assist for medications management   Equipment Recommendations  BSC/3in1;Rolling walker (2 wheels)    Recommendations for Other Services       Precautions / Restrictions Precautions Precautions: Fall Restrictions Weight Bearing Restrictions: No     Mobility  Bed Mobility Overal bed mobility: Modified Independent             General bed mobility comments: semi-supine to sitting edge of bed without any noted difficulties    Transfers Overall transfer level: Needs assistance Equipment used: Rolling walker (2 wheels) Transfers: Sit to/from Stand Sit to Stand: Supervision           General transfer comment: fairly strong stand up to RW (x3 trials from bed); steady    Ambulation/Gait Ambulation/Gait assistance: Min guard, Supervision Gait Distance (Feet):  (200 feet x2) Assistive device: Rolling walker (2 wheels) Gait Pattern/deviations: Step-through pattern Gait velocity: mildly decreased     General Gait Details: mildly more narrow BOS; steady with RW use   Stairs             Wheelchair Mobility    Modified Rankin (Stroke Patients Only)       Balance Overall balance assessment: Needs assistance Sitting-balance support: Feet supported, No upper extremity supported Sitting balance-Leahy Scale: Good Sitting balance - Comments: steady sitting reaching within BOS   Standing balance support: Bilateral upper extremity supported, During functional activity Standing balance-Leahy Scale: Good Standing balance comment: steady ambulating with RW use                            Cognition Arousal/Alertness: Awake/alert Behavior During Therapy: Restless Overall Cognitive Status: No family/caregiver present to determine baseline cognitive functioning  General Comments: Oriented to at least self and location; less confusion noted today        Exercises      General Comments  Nursing  cleared pt for participation in physical therapy.  Pt agreeable to PT session.      Pertinent Vitals/Pain Pain Assessment Pain Assessment: Faces Faces Pain Scale: Hurts a little bit Pain Location: L foot Pain Descriptors / Indicators: Discomfort, Tender Pain Intervention(s): Limited activity within patient's tolerance, Monitored during session, Repositioned Vitals (HR and O2 on room air) stable and WFL throughout treatment session.    Home Living                          Prior Function            PT Goals (current goals can now be found in the care plan section) Acute Rehab PT Goals Patient Stated Goal: to feel better PT Goal Formulation: With patient Time For Goal Achievement: 01/29/22 Potential to Achieve Goals: Good Progress towards PT goals: Progressing toward goals    Frequency    Min 2X/week      PT Plan Current plan remains appropriate    Co-evaluation              AM-PAC PT "6 Clicks" Mobility   Outcome Measure  Help needed turning from your back to your side while in a flat bed without using bedrails?: None Help needed moving from lying on your back to sitting on the side of a flat bed without using bedrails?: None Help needed moving to and from a bed to a chair (including a wheelchair)?: None Help needed standing up from a chair using your arms (e.g., wheelchair or bedside chair)?: None Help needed to walk in hospital room?: A Little Help needed climbing 3-5 steps with a railing? : A Little 6 Click Score: 22    End of Session Equipment Utilized During Treatment: Gait belt Activity Tolerance: Patient tolerated treatment well Patient left:  (sitting on edge of bed with sitter next to pt) Nurse Communication: Mobility status;Precautions PT Visit Diagnosis: Other abnormalities of gait and mobility (R26.89);Difficulty in walking, not elsewhere classified (R26.2);Muscle weakness (generalized) (M62.81)     Time: 2956-2130 PT Time  Calculation (min) (ACUTE ONLY): 27 min  Charges:  $Gait Training: 8-22 mins $Therapeutic Activity: 8-22 mins                     Daysia Vandenboom, PT 01/20/22, 1:16 PM

## 2022-01-20 NOTE — Progress Notes (Signed)
PROGRESS NOTE    Christine [name]  FAO:[id number] DOB: July 24, 1945 DOA: 01/14/2022 PCP: Christine Hire, MD  235A/235A-AA  LOS: 6 days   Brief hospital course: No notes on file  Assessment & Plan: Christine [name] is a 76 y.o. female with medical history significant of CHF with EF 25-30, hypertension, hyperlipidemia, COPD, anxiety, CAD, stent placement, ICD placement, breast cancer with chronic left chest wound, AAA, thrombocytopenia, cognitive decline, PVD and recent admission for critical left lower extremity ischemia (s/p of reconstruction byb VVS), CKD-3a, who presents with shortness breath and visual hallucination.   * Acute on chronic combined systolic and diastolic CHF (congestive heart failure) ([place]) Patient has shortness of breath, and crackles on auscultation, positive JVD, elevated BNP 2034, clinically consistent with CHF exacerbation.  2D echo on 12/04/2021 showed EF 25-30% with grade 1 diastolic dysfunction.  Consulted Dr. Nehemiah Massed of cardiology. --received IV lasix 40 mg x2 and IV lasix 20 mg x2 Plan: --cont oral lasix 20 mg daily  --cont coreg at reduced 3.125 mg BID due to low BP -Hold Irbesartan due to hypotension   Open wound_left chest wall, chronic --wound care consulted, wound has not changed in appearance.   --wound cx obtained on presentation pos for enterobacter and klebsiella --started on ceftriaxone on presentation, transitioned to Cipro, per ID pharm rec (since enterobacter can become resistant to po b-lactams). Plan: --cont Cipro   CAD (coronary artery disease) -Continue aspirin and Plavix daily --cont statin  Acute metabolic encephalopathy CT head negative.  Unclear etiology.   --delirium vs underlying dementia.   Plan: --sitter at bedside --seroquel 25 mg nightly --IV hadol PRN for agitation  Chronic obstructive pulmonary disease ([place]) --stable  Essential hypertension --cong coreg, lasix  --hold Imdur due to hypotension  HLD (hyperlipidemia) -  Crestor  Thrombocytopenia (HCC) This is a chronic issue.  Platelet 86.  No active bleeding -Follow-up with CBC  Anxiety --avoid benzo due to delirium --IV haldol PRN  PAD (peripheral artery disease) (HCC) - Aspirin, Plavix, Crestor  Underweight --supplements per dietician    History of breast cancer - Continue tamoxifen  Chronic kidney disease, stage 3a (HCC) Stable  UTI, ruled out --urine cx no growth --d/c'ed ceftriaxone  Malnutrition, ruled out --pt was seen by dietician, not dx with malnutrition   DVT prophylaxis: Lovenox SQ Code Status: DNR  Family Communication:  Level of care: Telemetry Cardiac Dispo:   The patient is from: home Anticipated d/c is to: undetermined Anticipated d/c date is: undetermined Patient currently is not medically ready to d/c due to: unsafe to be at home by herself   Subjective and Interval History:  Pt was seen singing with her sitter.  No complaints today.     Objective: Vitals:   01/20/22 0729 01/20/22 1100 01/20/22 1500 01/20/22 1925  BP: 128/65 129/70 120/65 (!) 105/92  Pulse: 70 70 71 80  Resp: '17 17 20 16  '$ Temp: 98.8 F (37.1 C) 98.3 F (36.8 C) 98.3 F (36.8 C) 98.4 F (36.9 C)  TempSrc: Oral Oral Oral Oral  SpO2: 98% 98% 98% 99%  Weight:      Height:       No intake or output data in the 24 hours ending 01/20/22 1948  Filed Weights   01/15/22 0408 01/16/22 0500 01/18/22 0430  Weight: 31.5 kg 33.1 kg 31.7 kg    Examination:   Constitutional: NAD, alert, oriented to person and place, sitting at edge of bed HEENT: conjunctivae and lids normal, EOMI CV: No  cyanosis.   RESP: normal respiratory effort, on RA Neuro: II - XII grossly intact.   Psych: better mood and affect.     Data Reviewed: I have personally reviewed labs and imaging studies  Time spent: 25 minutes  Enzo Bi, MD [facility] If 7PM-7AM, please contact night-coverage 01/20/2022, 7:48 PM

## 2022-01-20 NOTE — TOC Progression Note (Addendum)
Transition of Care [facility]) - Progression Note    Patient Details  Name: Christine [name] MRN: [id number] Date of Birth: 1945/08/15  [facility] CM/SW [place], [place] Phone Number: 01/20/2022, 3:54 PM  Clinical Narrative:     Insurance has denied SNF for STR, CSW has reached out to financial counselors who report patient does have long term medicaid, however Ricky at [employer] reports patient does not. CSW has inquired if patient could come as short term under medicaid, he declines at this time. CSW has reached out to [place] at [facility] who reports they also decline at this time as they have no medicaid beds.   CSW continues to search for medicaid bed for patient at this time. CSW has also reached out to [facility] for assistance in long term placement weather ALF, memory care, group home or LTC snf to see what their ideas are.   Unable to reach son to update on above, called and unable to lvm.        Expected Discharge Plan and Services                                                 Social Determinants of Health (SDOH) Interventions    Readmission Risk Interventions     No data to display

## 2022-01-21 DIAGNOSIS — I5043 Acute on chronic combined systolic (congestive) and diastolic (congestive) heart failure: Secondary | ICD-10-CM | POA: Diagnosis not present

## 2022-01-21 DIAGNOSIS — E43 Unspecified severe protein-calorie malnutrition: Secondary | ICD-10-CM | POA: Insufficient documentation

## 2022-01-21 NOTE — Progress Notes (Signed)
PROGRESS NOTE    Christine [name]  NUU:[id number] DOB: 25-Feb-1946 DOA: 01/14/2022 PCP: Baxter Hire, MD  108A/108A-AA  LOS: 7 days   Brief hospital course: No notes on file  Assessment & Plan: Christine [name] is a 76 y.o. female with medical history significant of CHF with EF 25-30, hypertension, hyperlipidemia, COPD, anxiety, CAD, stent placement, ICD placement, breast cancer with chronic left chest wound, AAA, thrombocytopenia, cognitive decline, PVD and recent admission for critical left lower extremity ischemia (s/p of reconstruction byb VVS), CKD-3a, who presents with shortness breath and visual hallucination.   * Acute on chronic combined systolic and diastolic CHF (congestive heart failure) ([place]) Patient has shortness of breath, and crackles on auscultation, positive JVD, elevated BNP 2034, clinically consistent with CHF exacerbation.  2D echo on 12/04/2021 showed EF 25-30% with grade 1 diastolic dysfunction.  Consulted Dr. Nehemiah Massed of cardiology. --received IV lasix 40 mg x2 and IV lasix 20 mg x2 Plan: --cont oral lasix 20 mg daily  --cont coreg at reduced 3.125 mg BID due to low BP -Hold Irbesartan due to hypotension   Open wound_left chest wall, chronic --wound care consulted, wound has not changed in appearance.   --wound cx obtained on presentation pos for enterobacter and klebsiella --started on ceftriaxone on presentation, transitioned to Cipro, per ID pharm rec (since enterobacter can become resistant to po b-lactams), completed 8-day course.   CAD (coronary artery disease) -Continue aspirin and Plavix daily --cont statin  Acute metabolic encephalopathy CT head negative.  Unclear etiology.   --delirium vs underlying dementia.   Plan: --sitter d/c'ed today --seroquel 25 mg nightly --IV hadol PRN for agitation  Chronic obstructive pulmonary disease ([place]) --stable  Essential hypertension --cong coreg, lasix  --hold Imdur due to hypotension  HLD  (hyperlipidemia) - Crestor  Thrombocytopenia (HCC) This is a chronic issue.  Platelet 86.  No active bleeding -Follow-up with CBC  Anxiety --avoid benzo due to delirium --IV haldol PRN  PAD (peripheral artery disease) (HCC) - Aspirin, Plavix, Crestor  Underweight --supplements per dietician    History of breast cancer - Continue tamoxifen  Protein-calorie malnutrition, severe --supplements per dietician  Chronic kidney disease, stage 3a ([place]) Stable  UTI, ruled out --urine cx no growth --d/c'ed ceftriaxone   DVT prophylaxis: Lovenox SQ Code Status: DNR  Family Communication:  Level of care: Med-Surg Dispo:   The patient is from: home Anticipated d/c is to: undetermined Anticipated d/c date is: undetermined Patient currently is not medically ready to d/c due to: unsafe to be at home by herself   Subjective and Interval History:  Pt was calm in her room without sitter today, and said she was doing well.   Objective: Vitals:   01/21/22 0438 01/21/22 0750 01/21/22 1143 01/21/22 1519  BP:  (!) 142/64 130/63 (!) 142/61  Pulse:  73 77 80  Resp:  '18 19 17  '$ Temp:  97.9 F (36.6 C) 98 F (36.7 C) 98.1 F (36.7 C)  TempSrc:   Oral Oral  SpO2:  100% 99% 100%  Weight: 30.1 kg     Height:        Intake/Output Summary (Last 24 hours) at 01/21/2022 1926 Last data filed at 01/21/2022 1838 Gross per 24 hour  Intake 480 ml  Output --  Net 480 ml    Filed Weights   01/16/22 0500 01/18/22 0430 01/21/22 0438  Weight: 33.1 kg 31.7 kg 30.1 kg    Examination:   Constitutional: NAD, alert, oriented to person and  place HEENT: conjunctivae and lids normal, EOMI CV: No cyanosis.   RESP: normal respiratory effort, on RA SKIN: warm, dry Neuro: II - XII grossly intact.   Psych: pleasant mood and affect.     Data Reviewed: I have personally reviewed labs and imaging studies  Time spent: 25 minutes  Enzo Bi, MD [facility] If 7PM-7AM, please contact  night-coverage 01/21/2022, 7:26 PM

## 2022-01-21 NOTE — Progress Notes (Signed)
Pt transferred to Room 108 via wheelchair, accompanied by NT, Vivien Rota. Patient stable. Report previously called by prior RN.

## 2022-01-21 NOTE — Progress Notes (Signed)
Nutrition Follow-up  DOCUMENTATION CODES:   Underweight, Severe malnutrition in context of chronic illness  INTERVENTION:   -Continue MVI with minerals daily -Continue 500 mg vitamin C BID -Continue 220 mg zinc sulfate daily -Continue Ensure Enlive po TID, each supplement provides 350 kcal and 20 grams of protein  NUTRITION DIAGNOSIS:   Severe Malnutrition related to chronic illness (COPD) as evidenced by severe fat depletion, severe muscle depletion.  Ongoing  GOAL:   Patient will meet greater than or equal to 90% of their needs  Progressing   MONITOR:   PO intake, Supplement acceptance  REASON FOR ASSESSMENT:   Consult Assessment of nutrition requirement/status  ASSESSMENT:   Pt with medical history significant of CHF with EF 25-30, hypertension, hyperlipidemia, COPD, anxiety, CAD, stent placement, ICD placement, breast cancer with chronic left chest wound, AAA, thrombocytopenia, cognitive decline, PVD and recent admission for critical left lower extremity ischemia (s/p of reconstruction byb VVS), CKD-3a, who presents with shortness breath and altered mental status.  Reviewed I/O's: -4.7 L since admission   Spoke with pt at bedside, who was pleasant and in good spirits today. She shares "I made it through the night and I thank God for that". Pet reports good appetite PTA, typically grazing on fried chicken and roast beef; she tries to use her hunger and satiety cues to tell her when and what to eat. Pt shares that intake has improved since admission and has tried to eat most of the foods that is provided. Noted meal completions 20-75%. Pt missing teeth, but does not find this to be a barrier for her. Offered option of mechanically altered diet, but pt politely declined, stating she would prefer to choose things that she thinks she can tolerate off the menu. Pt also likes Ensure supplements and would like to continue with these.   Pt reports weight loss over the past year,  however, unsure how much or UBW. Per pt, she shares she has always been small framed, but know that she is smaller and weaker now. Pt has experienced a 4.4% wt loss over the past week; suspect due to diuresis (-5.7 L since admission). Reviewed wt hx; pt has experienced a 19.2% wt loss over the past year. While this is not significant for time frame, it is concerning given underweight status.  Discussed importance of good meal and supplement intake to promote healing. Pt amenable to continue Ensure supplements. She expressed gratitude for RD visit.   Pt awaiting SNF placement for discharge.   Medications reviewed and include vitamin C, lasix, and zinc sulfate.   Labs reviewed: Mg: 2.5, CBGS: 129 (inpatient orders for glycemic control are ).    NUTRITION - FOCUSED PHYSICAL EXAM:  Flowsheet Row Most Recent Value  Orbital Region Moderate depletion  Upper Arm Region Severe depletion  Thoracic and Lumbar Region Severe depletion  Buccal Region Moderate depletion  Temple Region Moderate depletion  Clavicle Bone Region Severe depletion  Clavicle and Acromion Bone Region Severe depletion  Scapular Bone Region Severe depletion  Dorsal Hand Severe depletion  Patellar Region Severe depletion  Anterior Thigh Region Severe depletion  Posterior Calf Region Severe depletion  Edema (RD Assessment) None  Hair Reviewed  Eyes Reviewed  Mouth Reviewed  Skin Reviewed  Nails Reviewed       Diet Order:   Diet Order             Diet 2 gram sodium Room service appropriate? Yes; Fluid consistency: Thin  Diet effective now  EDUCATION NEEDS:   Education needs have been addressed  Skin:  Skin Assessment: Skin Integrity Issues: Skin Integrity Issues:: Other (Comment) Other: non-healing wound to lt breast  Last BM:  01/21/22  Height:   Ht Readings from Last 1 Encounters:  01/13/22 '5\' 3"'$  (1.6 m)    Weight:   Wt Readings from Last 1 Encounters:  01/21/22 30.1 kg     Ideal Body Weight:  52.3 kg  BMI:  Body mass index is 11.75 kg/m.  Estimated Nutritional Needs:   Kcal:  1250-1450  Protein:  65-80 grams  Fluid:  > 1.2 L    Christine [name], RD, LDN, [place] Registered Dietitian II Certified Diabetes Care and Education Specialist Please refer to [facility] for RD and/or RD on-call/weekend/after hours pager

## 2022-01-21 NOTE — Progress Notes (Addendum)
Mobility Specialist - Progress Note    01/21/22 1400  Mobility  Activity Ambulated independently in hallway  Level of Assistance Standby assist, set-up cues, supervision of patient - no hands on  Assistive Device None  Distance Ambulated (ft) 200 ft  Activity Response Tolerated well  $Mobility charge 1 Mobility   Pt OOB on RA upon arrival. Pt ambulates 1 lap around NS SBA. Pt returns to recliner with needs in reach and alarm set.  Gretchen Short  Mobility Specialist  01/21/22 2:02 PM

## 2022-01-21 NOTE — TOC Progression Note (Addendum)
Transition of Care [facility]) - Progression Note    Patient Details  Name: Christine [name] MRN: [id number] Date of Birth: 03-25-1946  Transition of Care [facility]) CM/SW [place], LCSW Phone Number: 01/21/2022, 9:55 AM  Clinical Narrative:  [facility] is checking LTC bed availability.   10:50 am: Extended LTC SNF search to surrounding counties while waiting on update from [facility]. Called and updated son.  11:53 am: [facility] admissions coordinator is going to come assess patient.  2:11 pm: [facility] is able to accept patient as soon as tomorrow. Tried calling son but no answer. Unable to leave a voicemail.  Expected Discharge Plan and Services                                                 Social Determinants of Health (SDOH) Interventions    Readmission Risk Interventions     No data to display

## 2022-01-21 NOTE — Progress Notes (Signed)
Mobility Specialist - Progress Note    01/21/22 1055  Mobility  Activity Ambulated with assistance in hallway;Stood at bedside;Dangled on edge of bed  Level of Assistance Standby assist, set-up cues, supervision of patient - no hands on  Assistive Device Front wheel walker  Distance Ambulated (ft) 200 ft  Activity Response Tolerated well  $Mobility charge 1 Mobility    Pt EOB on RA upon arrival. Pt dons socks indep. Pt sts and ambulates with supervision. PT returns to bed with needs in reach and bed alarm on.   Gretchen Short  Mobility Specialist  01/21/22 10:57 AM

## 2022-01-21 NOTE — Assessment & Plan Note (Signed)
--  supplements per dietician  

## 2022-01-22 DIAGNOSIS — G9341 Metabolic encephalopathy: Secondary | ICD-10-CM | POA: Diagnosis not present

## 2022-01-22 DIAGNOSIS — N1831 Chronic kidney disease, stage 3a: Secondary | ICD-10-CM | POA: Diagnosis not present

## 2022-01-22 DIAGNOSIS — I5043 Acute on chronic combined systolic (congestive) and diastolic (congestive) heart failure: Secondary | ICD-10-CM | POA: Diagnosis not present

## 2022-01-22 MED ORDER — CARVEDILOL 3.125 MG PO TABS
3.1250 mg | ORAL_TABLET | Freq: Two times a day (BID) | ORAL | 0 refills | Status: AC
Start: 2022-01-22 — End: 2022-04-10

## 2022-01-22 MED ORDER — VALSARTAN 320 MG PO TABS: 320.0000 mg | ORAL_TABLET | Freq: Every day | ORAL | Status: AC

## 2022-01-22 MED ORDER — FUROSEMIDE 20 MG PO TABS: 20.0000 mg | ORAL_TABLET | Freq: Every day | ORAL | 0 refills | Status: AC

## 2022-01-22 MED ORDER — QUETIAPINE FUMARATE 25 MG PO TABS: 25.0000 mg | ORAL_TABLET | Freq: Every day | ORAL | 0 refills | Status: AC

## 2022-01-22 NOTE — Progress Notes (Signed)
Physical Therapy Treatment Patient Details Name: Christine [name] MRN: [id number] DOB: 1946/04/14 Today's Date: 01/22/2022   History of Present Illness 76 year old female with history of HFrEF (LVEF 25-30%, G1 DD, LV apical segment akinesis 11/2021) with an abandoned ICD for several years, CAD s/p multiple stents 2006, PAD with recent admission in June 2023 with critical limb ischemia s/p distal abdominal aorta reconstruction, left external iliac artery & left common iliac artery stenting and history of renal artery stent 02/2019, left breast cancer s/p mastectomy with metastases to the lungs with large necrotic left chest wall wound, hypertension, hyperlipidemia, ongoing tobacco use who presents to [facility] ED 01/14/2022 with visual hallucinations, dyspnea on exertion and bilateral lower extremity swelling x1 month    PT Comments    Pt was pleasant and motivated to participate during the session and put forth good effort throughout. Pt demonstrated fair to good stability during balance training including reaching outside BOS with feet apart, together, and semi-tandem.  Minor instability with eyes closed activities with one instance of min A to prevent LOB. Pt steady during amb but tended to walk with walker far out in front and demonstrated poor carryover of proper posture/positioning once cues were given to correct.  Pt somewhat impulsive and would benefit from 24/7 supervision at discharge for general safety.       Recommendations for follow up therapy are one component of a multi-disciplinary discharge planning process, led by the attending physician.  Recommendations may be updated based on patient status, additional functional criteria and insurance authorization.  Follow Up Recommendations  Other (comment) (24/7 supervision)     Assistance Recommended at Discharge Frequent or constant Supervision/Assistance  Patient can return home with the following A little help with  bathing/dressing/bathroom;Assistance with cooking/housework;A little help with walking and/or transfers;Direct supervision/assist for financial management;Assist for transportation;Help with stairs or ramp for entrance;Direct supervision/assist for medications management   Equipment Recommendations  BSC/3in1;Rolling walker (2 wheels)    Recommendations for Other Services       Precautions / Restrictions Precautions Precautions: Fall Restrictions Weight Bearing Restrictions: No     Mobility  Bed Mobility Overal bed mobility: Modified Independent             General bed mobility comments: Min extra time and effort only    Transfers Overall transfer level: Needs assistance Equipment used: Rolling walker (2 wheels) Transfers: Sit to/from Stand Sit to Stand: Supervision           General transfer comment: Min posterior instability upon initial stand that the pt was able to self correct and no instances of instability during subsequent sit to stands    Ambulation/Gait Ambulation/Gait assistance: Supervision Gait Distance (Feet): 350 Feet Assistive device: Rolling walker (2 wheels) Gait Pattern/deviations: Step-through pattern, Decreased step length - right, Decreased step length - left Gait velocity: mildly decreased     General Gait Details: Slow cadence with frequent cuing needed to amb closer to the RW with upright posture but steady without LOB   Stairs             Wheelchair Mobility    Modified Rankin (Stroke Patients Only)       Balance Overall balance assessment: Needs assistance Sitting-balance support: Feet supported, No upper extremity supported Sitting balance-Leahy Scale: Good     Standing balance support: Bilateral upper extremity supported, During functional activity, No upper extremity supported Standing balance-Leahy Scale: Good  Cognition Arousal/Alertness: Awake/alert Behavior During  Therapy: WFL for tasks assessed/performed Overall Cognitive Status: No family/caregiver present to determine baseline cognitive functioning                                          Exercises Other Exercises Other Exercises: Static and dynamic standing balance exercises with various foot postions, reaching outside BOS, and combinations of eyes open/closed and head still/head turns    General Comments        Pertinent Vitals/Pain Pain Assessment Pain Assessment: No/denies pain    Home Living                          Prior Function            PT Goals (current goals can now be found in the care plan section) Progress towards PT goals: Progressing toward goals    Frequency    Min 2X/week      PT Plan Current plan remains appropriate    Co-evaluation              AM-PAC PT "6 Clicks" Mobility   Outcome Measure  Help needed turning from your back to your side while in a flat bed without using bedrails?: None Help needed moving from lying on your back to sitting on the side of a flat bed without using bedrails?: None Help needed moving to and from a bed to a chair (including a wheelchair)?: A Little Help needed standing up from a chair using your arms (e.g., wheelchair or bedside chair)?: A Little Help needed to walk in hospital room?: A Little Help needed climbing 3-5 steps with a railing? : A Little 6 Click Score: 20    End of Session Equipment Utilized During Treatment: Gait belt Activity Tolerance: Patient tolerated treatment well Patient left: in bed;with call bell/phone within reach;with bed alarm set;Other (comment) (pt declined up in chair) Nurse Communication: Mobility status PT Visit Diagnosis: Other abnormalities of gait and mobility (R26.89);Difficulty in walking, not elsewhere classified (R26.2);Muscle weakness (generalized) (M62.81)     Time: 7035-0093 PT Time Calculation (min) (ACUTE ONLY): 23 min  Charges:  $Gait  Training: 8-22 mins $Therapeutic Exercise: 8-22 mins                     D. Scott Symone Cornman PT, DPT 01/22/22, 3:01 PM

## 2022-01-22 NOTE — Progress Notes (Signed)
Report called to jasmine, RN at [employer]. Wound care completed. Last BM charted 8/8 at 0720. Patient assisted with getting dressed. EMS to transport. No further needs.

## 2022-01-22 NOTE — Progress Notes (Signed)
   01/22/22 0418  Mobility  HOB Elevated/Bed Position HOB less than 20  Activity Stood at bedside;Ambulated with assistance in room;Ambulated with assistance to bathroom  Range of Motion/Exercises Active;All extremities  Level of Assistance Standby assist, set-up cues, supervision of patient - no hands on  Assistive Device Front wheel walker  Distance Ambulated (ft) 20 ft  Activity Response Tolerated well (pt reports sharp pains in LLE (foot))  Transport method Wheelchair   Patient reported sharp pains in LLE(foot). Ambulated with cues, independently w/ walker to BR and back to bed

## 2022-01-22 NOTE — Progress Notes (Signed)
Mobility Specialist - Progress Note    01/22/22 1305  Mobility  Activity Ambulated independently in hallway;Stood at bedside;Dangled on edge of bed  Level of Assistance Standby assist, set-up cues, supervision of patient - no hands on  Assistive Device None  Activity Response Tolerated well  $Mobility charge 1 Mobility    Pt supine in bed on RA upon arrival. Pt STS and ambulates 1 lap around NS SBA. Pt returns to bed with needs in reach and bed alarm on.  Gretchen Short  Mobility Specialist  01/22/22 1:06 PM

## 2022-01-22 NOTE — Progress Notes (Signed)
PROGRESS NOTE    Christine [name]  XAJ:[id number] DOB: 1946/04/27 DOA: 01/14/2022 PCP: Baxter Hire, MD  Assessment & Plan:   Principal Problem:   Acute on chronic combined systolic and diastolic CHF (congestive heart failure) ([place]) Active Problems:   Open wound_left chest wall, chronic   CAD (coronary artery disease)   Acute metabolic encephalopathy   Chronic obstructive pulmonary disease ([place])   Essential hypertension   HLD (hyperlipidemia)   Thrombocytopenia ([facility])   Anxiety   PAD (peripheral artery disease) ([facility])   Underweight   History of breast cancer   Chronic kidney disease, stage 3a ([place])   Protein-calorie malnutrition, severe  Assessment and Plan: Acute on chronic combined CHF: shortness of breath, and crackles on auscultation, positive JVD, elevated BNP 2034, clinically consistent with CHF exacerbation.  Echo on 12/04/2021 showed EF 25-30% with grade 1 diastolic dysfunction. Continue on lasix. Monitor I/Os. Continue on reduced rate of coreg. Holding irbesartan    Chronic open wound of left chest wall: wound cx grew enterobacter, klebsiella. Completed abx course    Hx of CAD: continue on aspirin, plavix, statin    Acute metabolic encephalopathy: CT head neg. Etiology unclear, ddx delirium vs dementia. Continue on seroquel. Haldol prn.    COPD: w/o exacerbation. Continue on bronchodilators    HTN: continue on coreg, lasix. Holding imdur   HLD: continue on statin    Thrombocytopenia: chronic. Labile    Anxiety: severity unknown. Haldol prn.    PAD: continue on aspirin, plavix, statin   Hx of breast cancer: continue on tamoxifen    Severe protein calorie malnutrition: continue w/ nutritional supplements    CKDIIIa: Cr is labile. Avoid nephrotoxic meds    UTI: ruled out. Urine cx showed no growth        DVT prophylaxis: lovenox Code Status: DNR Family Communication:  Disposition Plan: medically stable. Likely d/c to SNF  Level of care:  Med-Surg  Status is: Inpatient Remains inpatient appropriate because: waiting on SNF placement    Consultants:    Procedures:   Antimicrobials:  Subjective: Pt is pleasantly confused   Objective: Vitals:   01/21/22 1519 01/21/22 1955 01/22/22 0514 01/22/22 0803  BP: (!) 142/61 (!) 102/47 116/63 136/67  Pulse: 80 80 86 76  Resp: '17 16 17 18  '$ Temp: 98.1 F (36.7 C) 98.9 F (37.2 C) 98.1 F (36.7 C) 98.2 F (36.8 C)  TempSrc: Oral     SpO2: 100% 100% 100% 100%  Weight:      Height:        Intake/Output Summary (Last 24 hours) at 01/22/2022 0815 Last data filed at 01/21/2022 1838 Gross per 24 hour  Intake 480 ml  Output --  Net 480 ml   Filed Weights   01/16/22 0500 01/18/22 0430 01/21/22 0438  Weight: 33.1 kg 31.7 kg 30.1 kg    Examination:  General exam: Appears calm and comfortable. Cachetic appearing   Respiratory system: decreased breath sounds b/l. Cardiovascular system: S1 & S2+. No  rubs, gallops or clicks. Gastrointestinal system: Abdomen is nondistended, soft and nontender. Normal bowel sounds heard. Central nervous system: Alert and oriented to self only. Moves all extremities Psychiatry: Judgement and insight appears poor. Flat mood and affect     Data Reviewed: I have personally reviewed following labs and imaging studies  CBC: Recent Labs  Lab 01/16/22 0435 01/17/22 0842 01/18/22 0944 01/19/22 0442  WBC 7.0 10.3 8.4 7.7  HGB 11.1* 11.3* 11.6* 11.1*  HCT 34.7* 35.2* 36.8  35.8*  MCV 89.2 88.4 89.5 91.3  PLT 115* 127* 124* 833*   Basic Metabolic Panel: Recent Labs  Lab 01/16/22 0435 01/17/22 0842 01/18/22 0944 01/19/22 0442  NA 140 139 139 139  K 3.7 3.6 4.2 4.6  CL 104 104 104 104  CO2 '26 27 25 26  '$ GLUCOSE 104* 130* 141* 113*  BUN 27* 31* 35* 44*  CREATININE 1.10* 0.98 1.06* 1.10*  CALCIUM 8.5* 8.5* 8.6* 8.6*  MG 2.3 2.4 2.4 2.5*   GFR: Estimated Creatinine Clearance: 21.3 mL/min (A) (by C-G formula based on SCr of 1.1 mg/dL  (H)). Liver Function Tests: No results for input(s): "AST", "ALT", "ALKPHOS", "BILITOT", "PROT", "ALBUMIN" in the last 168 hours. No results for input(s): "LIPASE", "AMYLASE" in the last 168 hours. No results for input(s): "AMMONIA" in the last 168 hours. Coagulation Profile: No results for input(s): "INR", "PROTIME" in the last 168 hours. Cardiac Enzymes: No results for input(s): "CKTOTAL", "CKMB", "CKMBINDEX", "TROPONINI" in the last 168 hours. BNP (last 3 results) No results for input(s): "PROBNP" in the last 8760 hours. HbA1C: No results for input(s): "HGBA1C" in the last 72 hours. CBG: No results for input(s): "GLUCAP" in the last 168 hours. Lipid Profile: No results for input(s): "CHOL", "HDL", "LDLCALC", "TRIG", "CHOLHDL", "LDLDIRECT" in the last 72 hours. Thyroid Function Tests: No results for input(s): "TSH", "T4TOTAL", "FREET4", "T3FREE", "THYROIDAB" in the last 72 hours. Anemia Panel: No results for input(s): "VITAMINB12", "FOLATE", "FERRITIN", "TIBC", "IRON", "RETICCTPCT" in the last 72 hours. Sepsis Labs: No results for input(s): "PROCALCITON", "LATICACIDVEN" in the last 168 hours.  Recent Results (from the past 240 hour(s))  Urine Culture     Status: None   Collection Time: 01/13/22  1:53 AM   Specimen: Urine, Random  Result Value Ref Range Status   Specimen Description   Final    URINE, RANDOM Performed at [facility], [address] [place], [place] [address]    Special Requests   Final    NONE Performed at [facility], [address], [place], [place] [address]    Culture   Final    NO GROWTH Performed at [facility], [place] [address] [place], [place] [address]    Report Status 01/15/2022 FINAL  Final  Blood culture (routine x 2)     Status: None   Collection Time: 01/14/22  2:20 AM   Specimen: BLOOD  Result Value Ref Range Status   Specimen Description BLOOD RIGHT ARM  Final   Special Requests   Final    BOTTLES DRAWN  AEROBIC AND ANAEROBIC Blood Culture adequate volume   Culture   Final    NO GROWTH 5 DAYS Performed at [facility], [address], [place], [place] [address]    Report Status 01/19/2022 FINAL  Final  Aerobic Culture w Gram Stain (superficial specimen)     Status: None   Collection Time: 01/14/22  2:28 AM   Specimen: Abscess  Result Value Ref Range Status   Specimen Description   Final    ABSCESS LEFT UPPER CHEST Performed at [facility], [address] [place], [place] [address]    Special Requests   Final    NONE Performed at [facility], [address] [place], [place] [address]    Gram Stain   Final    NO SQUAMOUS EPITHELIAL CELLS SEEN RARE WBC SEEN FEW GRAM POSITIVE RODS FEW GRAM POSITIVE COCCI FEW GRAM NEGATIVE RODS    Culture  Final    MODERATE ENTEROBACTER CLOACAE MODERATE KLEBSIELLA PNEUMONIAE ABUNDANT CORYNEBACTERIUM STRIATUM Standardized susceptibility testing for this organism is not available. Performed at [facility], [place] [address] [place], [place] [address]    Report Status 01/17/2022 FINAL  Final   Organism ID, Bacteria ENTEROBACTER CLOACAE  Final   Organism ID, Bacteria KLEBSIELLA PNEUMONIAE  Final      Susceptibility   Enterobacter cloacae - MIC*    CEFAZOLIN >=64 RESISTANT Resistant     CEFEPIME <=0.12 SENSITIVE Sensitive     CEFTAZIDIME <=1 SENSITIVE Sensitive     CIPROFLOXACIN <=0.25 SENSITIVE Sensitive     GENTAMICIN <=1 SENSITIVE Sensitive     IMIPENEM <=0.25 SENSITIVE Sensitive     TRIMETH/SULFA <=20 SENSITIVE Sensitive     PIP/TAZO 8 SENSITIVE Sensitive     * MODERATE ENTEROBACTER CLOACAE   Klebsiella pneumoniae - MIC*    AMPICILLIN RESISTANT Resistant     CEFAZOLIN <=4 SENSITIVE Sensitive     CEFEPIME <=0.12 SENSITIVE Sensitive     CEFTAZIDIME <=1 SENSITIVE Sensitive     CEFTRIAXONE <=0.25 SENSITIVE Sensitive     CIPROFLOXACIN <=0.25 SENSITIVE Sensitive     GENTAMICIN <=1 SENSITIVE Sensitive      IMIPENEM <=0.25 SENSITIVE Sensitive     TRIMETH/SULFA <=20 SENSITIVE Sensitive     AMPICILLIN/SULBACTAM 4 SENSITIVE Sensitive     PIP/TAZO <=4 SENSITIVE Sensitive     * MODERATE KLEBSIELLA PNEUMONIAE  Blood culture (routine x 2)     Status: None   Collection Time: 01/14/22  2:29 AM   Specimen: BLOOD  Result Value Ref Range Status   Specimen Description BLOOD RIGHT FA  Final   Special Requests   Final    BOTTLES DRAWN AEROBIC AND ANAEROBIC Blood Culture results may not be optimal due to an inadequate volume of blood received in culture bottles   Culture   Final    NO GROWTH 5 DAYS Performed at [facility], [address] [place], [place] [address]    Report Status 01/19/2022 FINAL  Final         Radiology Studies: No results found.      Scheduled Meds:  vitamin C  500 mg Oral BID   aspirin EC  81 mg Oral Daily   carvedilol  3.125 mg Oral BID WC   clopidogrel  75 mg Oral Daily   enoxaparin (LOVENOX) injection  30 mg Subcutaneous Q24H   feeding supplement  237 mL Oral TID BM   furosemide  20 mg Oral Daily   hydrOXYzine  25 mg Oral TID   multivitamin with minerals  1 tablet Oral Daily   nicotine  21 mg Transdermal Daily   pantoprazole  40 mg Oral Daily   QUEtiapine  25 mg Oral QHS   rosuvastatin  10 mg Oral Daily   tamoxifen  20 mg Oral Daily   traMADol  25 mg Oral BID   zinc sulfate  220 mg Oral Daily   Continuous Infusions:   LOS: 8 days    Time spent: 30 mins     Wyvonnia Dusky, MD [facility] Pager [phone]  If 7PM-7AM, please contact night-coverage 01/22/2022, 8:15 AM

## 2022-01-22 NOTE — Discharge Summary (Signed)
Physician Discharge Summary  Christine [name]:[id number] DOB: [birth year] DOA: 01/14/2022  PCP: Baxter Hire, MD  Admit date: 01/14/2022 Discharge date: 01/22/2022  Admitted From: home  Disposition:  SNF  Recommendations for Outpatient Follow-up:  Follow up with PCP in 1-2 weeks F/u w/ cardio, Dr. Nehemiah Massed, in 1-2 weeks    Home Health: no  Equipment/Devices:  Discharge Condition: stable  CODE STATUS: DNR  Diet recommendation: Heart Healthy  Brief/Interim Summary: HPI was taken from Dr. Blaine Hamper: Christine [name] is a 76 y.o. female with medical history significant of CHF with EF 25-30, hypertension, hyperlipidemia, COPD, anxiety, CAD, stent placement, ICD placement, breast cancer with chronic left chest wound, AAA, thrombocytopenia, cognitive decline, PVD and recent admission for critical left lower extremity ischemia (s/p of reconstruction byb VVS), CKD-3a, who presents with shortness breath and altered mental status.   Patient has AMS and history is limited.  I have tried to call her son without success.  I also tried to call her niece who did not pick up the phone. Pt is confused with hallucination.  She cannot answer some questions but not focused, cannot give detailed information.  She states she lives in an apartment alone.  She states tonight she started seeing people from another planet coming out of her ceiling.She went to tell her neighbor who called 911. She states that she has shortness of breath, particularly exertion.  She also has a bilateral lower leg edema.  Denies cough, fever or chills.  She also states that she has some central chest pain.  Denies symptoms of UTI.  She moves all extremities.  She is intermittently confused, knows her own name, knows that she is in hospital, confused about year 2023   Data reviewed independently and ED Course: pt was found to have BNP 2034, troponin level 11, lactic acid 1.8, WBC 5.0, positive urinalysis (hazy appearance, small amount of  leukocyte, rare bacteria, WBC 21-50), Tylenol level less than 10, salicylate level less than 7, stable renal function, temperature normal, blood pressure 148/93, heart rate 59, 71, RR 21, oxygen saturation 99% on room air.  Chest x-ray negative.  CT head negative.  Patient is admitted to telemetry bed as inpatient.  Dr. Nehemiah Massed of card is consulted.  As per Dr. Billie Ruddy: Christine [name] is a 76 y.o. female with medical history significant of CHF with EF 25-30, hypertension, hyperlipidemia, COPD, anxiety, CAD, stent placement, ICD placement, breast cancer with chronic left chest wound, AAA, thrombocytopenia, cognitive decline, PVD and recent admission for critical left lower extremity ischemia (s/p of reconstruction byb VVS), CKD-3a, who presents with shortness breath and visual hallucination.   As per Dr. Jimmye Norman 01/22/22: Pt was medically stable for d/c to SNF. Pt was d/c to SNF. For more information, please see previous progress/consult notes   Discharge Diagnoses:  Principal Problem:   Acute on chronic combined systolic and diastolic CHF (congestive heart failure) ([place]) Active Problems:   Open wound_left chest wall, chronic   CAD (coronary artery disease)   Acute metabolic encephalopathy   Chronic obstructive pulmonary disease (HCC)   Essential hypertension   HLD (hyperlipidemia)   Thrombocytopenia (HCC)   Anxiety   PAD (peripheral artery disease) (HCC)   Underweight   History of breast cancer   Chronic kidney disease, stage 3a (HCC)   Protein-calorie malnutrition, severe  Acute on chronic combined CHF: shortness of breath, and crackles on auscultation, positive JVD, elevated BNP 2034, clinically consistent with CHF exacerbation.  Echo on 12/04/2021 showed  EF 25-30% with grade 1 diastolic dysfunction. Continue on lasix. Monitor I/Os. Continue on reduced rate of coreg. Holding irbesartan until pt is able to see their cardiologist    Chronic open wound of left chest wall: wound cx grew  enterobacter, klebsiella. Completed abx course. Continue w/ wound care    Hx of CAD: continue on aspirin, plavix, statin    Acute metabolic encephalopathy: CT head neg. Etiology unclear, ddx delirium vs dementia. Continue on seroquel.    COPD: w/o exacerbation. Continue on bronchodilators    HTN: continue on coreg, lasix.   HLD: continue on statin    Thrombocytopenia: chronic. Labile    Anxiety: severity unknown. Haldol prn.    PAD: continue on aspirin, plavix, statin    Hx of breast cancer: continue on tamoxifen    Severe protein calorie malnutrition: continue w/ nutritional supplements    CKDIIIa: Cr is labile. Avoid nephrotoxic meds    UTI: ruled out. Urine cx showed no growth   Discharge Instructions  Discharge Instructions     Diet - low sodium heart healthy   Complete by: As directed    Discharge instructions   Complete by: As directed    F/u w/ PCP in 1-2 weeks. F/u w/ cardio, Dr. Nehemiah Massed, in 1-2 weeks   Discharge wound care:   Complete by: As directed    Wound care to left chest chronic, nonhealing wound (full thickness): Cleanse with NS, pat dry. Cover with silver hydrofiber (Aquacel Ag+ Advantage, [place] # [id number]).  Top with dry gauze and cover with silicone foam.  May moisten with NS prior to changing if adherent. Change every other day.      Increase activity slowly   Complete by: As directed       Allergies as of 01/22/2022       Reactions   Atorvastatin Other (See Comments)   increased LFT's   Lisinopril Cough   Oxycodone Hcl Itching, Other (See Comments)   Restless & jittery   Simvastatin Other (See Comments)   myalgias        Medication List     TAKE these medications    aspirin EC 81 MG tablet Take 81 mg by mouth daily.   bisacodyl 5 MG EC tablet Commonly known as: DULCOLAX Take 5 mg by mouth daily as needed for mild constipation or moderate constipation.   carvedilol 3.125 MG tablet Commonly known as: COREG Take 1 tablet (3.125  mg total) by mouth 2 (two) times daily with a meal. What changed:  medication strength how much to take when to take this   clopidogrel 75 MG tablet Commonly known as: PLAVIX TAKE 1 TABLET BY MOUTH ONCE DAILY   furosemide 20 MG tablet Commonly known as: LASIX Take 1 tablet (20 mg total) by mouth daily. Start taking on: January 23, 2022   hydrOXYzine 25 MG tablet Commonly known as: ATARAX Take 25 mg by mouth 3 (three) times daily.   isosorbide mononitrate 30 MG 24 hr tablet Commonly known as: IMDUR Take 1 tablet (30 mg total) by mouth daily.   LORazepam 0.5 MG tablet Commonly known as: ATIVAN Take 0.5 tablets (0.25 mg total) by mouth 2 (two) times daily as needed for anxiety.   pantoprazole 40 MG tablet Commonly known as: PROTONIX Take 40 mg by mouth daily.   PEG 3350 17 GM/SCOOP Powd Take 17 g by mouth daily as needed (constipation.).   QUEtiapine 25 MG tablet Commonly known as: SEROQUEL Take 1 tablet (25 mg total)  by mouth at bedtime.   rosuvastatin 10 MG tablet Commonly known as: CRESTOR Take 10 mg by mouth daily.   tamoxifen 20 MG tablet Commonly known as: NOLVADEX Take 1 tablet (20 mg total) by mouth daily.   traMADol 50 MG tablet Commonly known as: ULTRAM Take 25 mg by mouth 2 (two) times daily.   valsartan 320 MG tablet Commonly known as: DIOVAN Take 1 tablet (320 mg total) by mouth daily at 12 noon. Hold this medication until you see your cardiologist What changed: additional instructions               Discharge Care Instructions  (From admission, onward)           Start     Ordered   01/22/22 0000  Discharge wound care:       Comments: Wound care to left chest chronic, nonhealing wound (full thickness): Cleanse with NS, pat dry. Cover with silver hydrofiber (Aquacel Ag+ Advantage, Lawson # [id number]).  Top with dry gauze and cover with silicone foam.  May moisten with NS prior to changing if adherent. Change every other day.      01/22/22  1409            Follow-up Information     Corey Skains, MD. Go in 1 week(s).   Specialty: Cardiology Contact information: [facility] [place] [address] [phone]         Baxter Hire, MD Follow up.   Specialty: Internal Medicine Why: F/u in 1-2 weeks Contact information: [place] [address] [phone]                Allergies  Allergen Reactions   Atorvastatin Other (See Comments)    increased LFT's   Lisinopril Cough   Oxycodone Hcl Itching and Other (See Comments)    Restless & jittery   Simvastatin Other (See Comments)    myalgias    Consultations: Cardio    Procedures/Studies: DG Chest Portable 1 View  Result Date: 01/14/2022 CLINICAL DATA:  Shortness of breath on exertion EXAM: PORTABLE CHEST 1 VIEW COMPARISON:  12/03/2021 FINDINGS: Cardiac shadow is enlarged. Defibrillator is again noted. Right chest wall port is again seen. Lungs are clear bilaterally. No focal infiltrate is noted. IMPRESSION: No active disease. Electronically Signed   By: Inez Catalina M.D.   On: 01/14/2022 02:03   CT HEAD WO CONTRAST (5MM)  Result Date: 01/13/2022 CLINICAL DATA:  Mental status change of unknown cause. EXAM: CT HEAD WITHOUT CONTRAST TECHNIQUE: Contiguous axial images were obtained from the base of the skull through the vertex without intravenous contrast. RADIATION DOSE REDUCTION: This exam was performed according to the departmental dose-optimization program which includes automated exposure control, adjustment of the mA and/or kV according to patient size and/or use of iterative reconstruction technique. COMPARISON:  11/08/2021 FINDINGS: Brain: Diffuse cerebral atrophy. Ventricular dilatation consistent with central atrophy. Low-attenuation changes in the deep white matter consistent with small vessel ischemia. No abnormal extra-axial fluid collections. No mass effect or midline shift.  Gray-white matter junctions are distinct. Basal cisterns are not effaced. No acute intracranial hemorrhage. Basal ganglia calcifications representing normal variation. Old lacunar infarct in the left basal ganglia. Vascular: No hyperdense vessel or unexpected calcification. Skull: Calvarium appears intact. Sinuses/Orbits: Paranasal sinuses and mastoid air cells are clear. Other: None. IMPRESSION: No acute intracranial abnormalities. Chronic atrophy and small vessel ischemic changes. Electronically Signed   By: Oren Beckmann.D.  On: 01/13/2022 23:58   (Echo, Carotid, EGD, Colonoscopy, ERCP)    Subjective: Pt denies any complaints    Discharge Exam: Vitals:   01/22/22 0514 01/22/22 0803  BP: 116/63 136/67  Pulse: 86 76  Resp: 17 18  Temp: 98.1 F (36.7 C) 98.2 F (36.8 C)  SpO2: 100% 100%   Vitals:   01/21/22 1519 01/21/22 1955 01/22/22 0514 01/22/22 0803  BP: (!) 142/61 (!) 102/47 116/63 136/67  Pulse: 80 80 86 76  Resp: '17 16 17 18  '$ Temp: 98.1 F (36.7 C) 98.9 F (37.2 C) 98.1 F (36.7 C) 98.2 F (36.8 C)  TempSrc: Oral     SpO2: 100% 100% 100% 100%  Weight:      Height:        General: Pt is alert, awake, not in acute distress. Cachetic appearing  Cardiovascular: S1/S2 +, no rubs, no gallops Respiratory: CTA bilaterally, no wheezing, no rhonchi Abdominal: Soft, NT, ND, bowel sounds + Extremities: no edema, no cyanosis    The results of significant diagnostics from this hospitalization (including imaging, microbiology, ancillary and laboratory) are listed below for reference.     Microbiology: Recent Results (from the past 240 hour(s))  Urine Culture     Status: None   Collection Time: 01/13/22  1:53 AM   Specimen: Urine, Random  Result Value Ref Range Status   Specimen Description   Final    URINE, RANDOM Performed at [facility], [address] [place], [place] [address]    Special Requests   Final    NONE Performed at [facility], [address] [place], [place] [address]    Culture   Final    NO GROWTH Performed at [facility], [place] [address], [place], [place] [address]    Report Status 01/15/2022 FINAL  Final  Blood culture (routine x 2)     Status: None   Collection Time: 01/14/22  2:20 AM   Specimen: BLOOD  Result Value Ref Range Status   Specimen Description BLOOD RIGHT ARM  Final   Special Requests   Final    BOTTLES DRAWN AEROBIC AND ANAEROBIC Blood Culture adequate volume   Culture   Final    NO GROWTH 5 DAYS Performed at [facility], [address], [place], [place] [address]    Report Status 01/19/2022 FINAL  Final  Aerobic Culture w Gram Stain (superficial specimen)     Status: None   Collection Time: 01/14/22  2:28 AM   Specimen: Abscess  Result Value Ref Range Status   Specimen Description   Final    ABSCESS LEFT UPPER CHEST Performed at [facility], [address] [place], [place] [address]    Special Requests   Final    NONE Performed at [facility], [address] [place], [place] [address]    Gram Stain   Final    NO SQUAMOUS EPITHELIAL CELLS SEEN RARE WBC SEEN FEW GRAM POSITIVE RODS FEW GRAM POSITIVE COCCI FEW GRAM NEGATIVE RODS    Culture   Final    MODERATE ENTEROBACTER CLOACAE MODERATE KLEBSIELLA PNEUMONIAE ABUNDANT CORYNEBACTERIUM STRIATUM Standardized susceptibility testing for this organism is not available. Performed at [facility], [place] [address] [place], [place] [address]    Report Status 01/17/2022 FINAL  Final   Organism ID, Bacteria ENTEROBACTER CLOACAE  Final   Organism ID, Bacteria KLEBSIELLA PNEUMONIAE  Final      Susceptibility   Enterobacter cloacae -  MIC*    CEFAZOLIN >=64 RESISTANT Resistant     CEFEPIME <=0.12 SENSITIVE Sensitive     CEFTAZIDIME <=1 SENSITIVE Sensitive     CIPROFLOXACIN <=0.25 SENSITIVE Sensitive     GENTAMICIN <=1 SENSITIVE Sensitive     IMIPENEM <=0.25 SENSITIVE Sensitive      TRIMETH/SULFA <=20 SENSITIVE Sensitive     PIP/TAZO 8 SENSITIVE Sensitive     * MODERATE ENTEROBACTER CLOACAE   Klebsiella pneumoniae - MIC*    AMPICILLIN RESISTANT Resistant     CEFAZOLIN <=4 SENSITIVE Sensitive     CEFEPIME <=0.12 SENSITIVE Sensitive     CEFTAZIDIME <=1 SENSITIVE Sensitive     CEFTRIAXONE <=0.25 SENSITIVE Sensitive     CIPROFLOXACIN <=0.25 SENSITIVE Sensitive     GENTAMICIN <=1 SENSITIVE Sensitive     IMIPENEM <=0.25 SENSITIVE Sensitive     TRIMETH/SULFA <=20 SENSITIVE Sensitive     AMPICILLIN/SULBACTAM 4 SENSITIVE Sensitive     PIP/TAZO <=4 SENSITIVE Sensitive     * MODERATE KLEBSIELLA PNEUMONIAE  Blood culture (routine x 2)     Status: None   Collection Time: 01/14/22  2:29 AM   Specimen: BLOOD  Result Value Ref Range Status   Specimen Description BLOOD RIGHT FA  Final   Special Requests   Final    BOTTLES DRAWN AEROBIC AND ANAEROBIC Blood Culture results may not be optimal due to an inadequate volume of blood received in culture bottles   Culture   Final    NO GROWTH 5 DAYS Performed at [facility], [address] [place], [place] [address]    Report Status 01/19/2022 FINAL  Final     Labs: BNP (last 3 results) Recent Labs    12/03/21 1721 01/13/22 2318  BNP 3,227.9* 9,833.8*   Basic Metabolic Panel: Recent Labs  Lab 01/16/22 0435 01/17/22 0842 01/18/22 0944 01/19/22 0442  NA 140 139 139 139  K 3.7 3.6 4.2 4.6  CL 104 104 104 104  CO2 '26 27 25 26  '$ GLUCOSE 104* 130* 141* 113*  BUN 27* 31* 35* 44*  CREATININE 1.10* 0.98 1.06* 1.10*  CALCIUM 8.5* 8.5* 8.6* 8.6*  MG 2.3 2.4 2.4 2.5*   Liver Function Tests: No results for input(s): "AST", "ALT", "ALKPHOS", "BILITOT", "PROT", "ALBUMIN" in the last 168 hours. No results for input(s): "LIPASE", "AMYLASE" in the last 168 hours. No results for input(s): "AMMONIA" in the last 168 hours. CBC: Recent Labs  Lab 01/16/22 0435 01/17/22 0842 01/18/22 0944 01/19/22 0442  WBC 7.0 10.3  8.4 7.7  HGB 11.1* 11.3* 11.6* 11.1*  HCT 34.7* 35.2* 36.8 35.8*  MCV 89.2 88.4 89.5 91.3  PLT 115* 127* 124* 119*   Cardiac Enzymes: No results for input(s): "CKTOTAL", "CKMB", "CKMBINDEX", "TROPONINI" in the last 168 hours. BNP: Invalid input(s): "POCBNP" CBG: No results for input(s): "GLUCAP" in the last 168 hours. D-Dimer No results for input(s): "DDIMER" in the last 72 hours. Hgb A1c No results for input(s): "HGBA1C" in the last 72 hours. Lipid Profile No results for input(s): "CHOL", "HDL", "LDLCALC", "TRIG", "CHOLHDL", "LDLDIRECT" in the last 72 hours. Thyroid function studies No results for input(s): "TSH", "T4TOTAL", "T3FREE", "THYROIDAB" in the last 72 hours.  Invalid input(s): "FREET3" Anemia work up No results for input(s): "VITAMINB12", "FOLATE", "FERRITIN", "TIBC", "IRON", "RETICCTPCT" in the last 72 hours. Urinalysis    Component Value Date/Time   COLORURINE YELLOW (A) 01/13/2022 2318   APPEARANCEUR HAZY (A) 01/13/2022 2318   LABSPEC 1.010 01/13/2022 2318   PHURINE 6.0 01/13/2022 2318  GLUCOSEU NEGATIVE 01/13/2022 2318   HGBUR MODERATE (A) 01/13/2022 2318   BILIRUBINUR NEGATIVE 01/13/2022 2318   [place] 01/13/2022 2318   PROTEINUR 30 (A) 01/13/2022 2318   NITRITE NEGATIVE 01/13/2022 2318   LEUKOCYTESUR SMALL (A) 01/13/2022 2318   Sepsis Labs Recent Labs  Lab 01/16/22 0435 01/17/22 0842 01/18/22 0944 01/19/22 0442  WBC 7.0 10.3 8.4 7.7   Microbiology Recent Results (from the past 240 hour(s))  Urine Culture     Status: None   Collection Time: 01/13/22  1:53 AM   Specimen: Urine, Random  Result Value Ref Range Status   Specimen Description   Final    URINE, RANDOM Performed at [facility], [address], [place], [place] [address]    Special Requests   Final    NONE Performed at [facility], [address], [place], [place] [address]    Culture   Final    NO GROWTH Performed at [facility],  [place] [address], [place], [place] [address]    Report Status 01/15/2022 FINAL  Final  Blood culture (routine x 2)     Status: None   Collection Time: 01/14/22  2:20 AM   Specimen: BLOOD  Result Value Ref Range Status   Specimen Description BLOOD RIGHT ARM  Final   Special Requests   Final    BOTTLES DRAWN AEROBIC AND ANAEROBIC Blood Culture adequate volume   Culture   Final    NO GROWTH 5 DAYS Performed at [facility], [address] [place], [place] [address]    Report Status 01/19/2022 FINAL  Final  Aerobic Culture w Gram Stain (superficial specimen)     Status: None   Collection Time: 01/14/22  2:28 AM   Specimen: Abscess  Result Value Ref Range Status   Specimen Description   Final    ABSCESS LEFT UPPER CHEST Performed at [facility], [address] [place], [place] [address]    Special Requests   Final    NONE Performed at [facility], [address] [place], [place] [address]    Gram Stain   Final    NO SQUAMOUS EPITHELIAL CELLS SEEN RARE WBC SEEN FEW GRAM POSITIVE RODS FEW GRAM POSITIVE COCCI FEW GRAM NEGATIVE RODS    Culture   Final    MODERATE ENTEROBACTER CLOACAE MODERATE KLEBSIELLA PNEUMONIAE ABUNDANT CORYNEBACTERIUM STRIATUM Standardized susceptibility testing for this organism is not available. Performed at [facility], [place] [address] [place],  [address]    Report Status 01/17/2022 FINAL  Final   Organism ID, Bacteria ENTEROBACTER CLOACAE  Final   Organism ID, Bacteria KLEBSIELLA PNEUMONIAE  Final      Susceptibility   Enterobacter cloacae - MIC*    CEFAZOLIN >=64 RESISTANT Resistant     CEFEPIME <=0.12 SENSITIVE Sensitive     CEFTAZIDIME <=1 SENSITIVE Sensitive     CIPROFLOXACIN <=0.25 SENSITIVE Sensitive     GENTAMICIN <=1 SENSITIVE Sensitive     IMIPENEM <=0.25 SENSITIVE Sensitive     TRIMETH/SULFA <=20 SENSITIVE Sensitive     PIP/TAZO 8 SENSITIVE Sensitive     * MODERATE ENTEROBACTER CLOACAE    Klebsiella pneumoniae - MIC*    AMPICILLIN RESISTANT Resistant     CEFAZOLIN <=4 SENSITIVE Sensitive     CEFEPIME <=0.12 SENSITIVE Sensitive     CEFTAZIDIME <=1 SENSITIVE Sensitive     CEFTRIAXONE <=0.25 SENSITIVE Sensitive     CIPROFLOXACIN <=0.25 SENSITIVE Sensitive     GENTAMICIN <=  1 SENSITIVE Sensitive     IMIPENEM <=0.25 SENSITIVE Sensitive     TRIMETH/SULFA <=20 SENSITIVE Sensitive     AMPICILLIN/SULBACTAM 4 SENSITIVE Sensitive     PIP/TAZO <=4 SENSITIVE Sensitive     * MODERATE KLEBSIELLA PNEUMONIAE  Blood culture (routine x 2)     Status: None   Collection Time: 01/14/22  2:29 AM   Specimen: BLOOD  Result Value Ref Range Status   Specimen Description BLOOD RIGHT FA  Final   Special Requests   Final    BOTTLES DRAWN AEROBIC AND ANAEROBIC Blood Culture results may not be optimal due to an inadequate volume of blood received in culture bottles   Culture   Final    NO GROWTH 5 DAYS Performed at [facility], [address] [place], [place] [address]    Report Status 01/19/2022 FINAL  Final     Time coordinating discharge: Over 30 minutes  SIGNED:   Wyvonnia Dusky, MD  [facility] 01/22/2022, 2:10 PM Pager   If 7PM-7AM, please contact night-coverage [email or web address]

## 2022-01-22 NOTE — Progress Notes (Signed)
Mobility Specialist - Progress Note    01/22/22 0918  Mobility  Activity Ambulated with assistance in hallway;Stood at bedside;Dangled on edge of bed  Level of Assistance Contact guard assist, steadying assist  Assistive Device None  Distance Ambulated (ft) 160 ft  $Mobility charge 1 Mobility   Pt supine in bed on RA upon arrival. Pt sts with MinA and ambulates 1 lap around NS indep. Pt returns to bed with needs in reach and bed alarm on.   Gretchen Short  Mobility Specialist  01/22/22 9:20 AM

## 2022-01-22 NOTE — TOC Progression Note (Signed)
Transition of Care [facility]) - Progression Note    Patient Details  Name: Christine [name] MRN: [id number] Date of Birth: 05-23-1946  Transition of Care [facility]) CM/SW [place], RN Phone Number: 01/22/2022, 2:22 PM  Clinical Narrative:   transferring today to [facility] care son aware, as per [place] room 59B         Expected Discharge Plan and Services           Expected Discharge Date: 01/22/22                                     Social Determinants of Health (SDOH) Interventions    Readmission Risk Interventions     No data to display

## 2022-01-22 NOTE — Progress Notes (Signed)
Occupational Therapy Treatment Patient Details Name: Christine [name] MRN: [id number] DOB: May 02, 1946 Today's Date: 01/22/2022   History of present illness 76 year old female with history of HFrEF (LVEF 25-30%, G1 DD, LV apical segment akinesis 11/2021) with an abandoned ICD for several years, CAD s/p multiple stents 2006, PAD with recent admission in June 2023 with critical limb ischemia s/p distal abdominal aorta reconstruction, left external iliac artery & left common iliac artery stenting and history of renal artery stent 02/2019, left breast cancer s/p mastectomy with metastases to the lungs with large necrotic left chest wall wound, hypertension, hyperlipidemia, ongoing tobacco use who presents to [facility] ED 01/14/2022 with visual hallucinations, dyspnea on exertion and bilateral lower extremity swelling x1 month   OT comments  Upon entering the room, pt supine in bed with no c/o pain and RN present completing dressing change. Pt is pleasant and agreeable to OT intervention. She dons clean hospital gown with set up A. Pt ambulates into bathroom without use of RW and supervision. She is able to have BM and performs all hygiene and clothing management without assistance. Pt is able to demonstrate bed making with no LOB but cuing to attend to task. She remains tangential throughout session but easily redirected. Pt returning to bed and end of session with call bell and bed alarm activated. Pt needing 24/7 supervision and no OT follow up at this time.    Recommendations for follow up therapy are one component of a multi-disciplinary discharge planning process, led by the attending physician.  Recommendations may be updated based on patient status, additional functional criteria and insurance authorization.    Follow Up Recommendations  No OT follow up    Assistance Recommended at Discharge Frequent or constant Supervision/Assistance  Patient can return home with the following  Direct supervision/assist for  medications management;Direct supervision/assist for financial management;Assistance with cooking/housework   Equipment Recommendations  None recommended by OT       Precautions / Restrictions Precautions Precautions: Fall Restrictions Weight Bearing Restrictions: No       Mobility Bed Mobility Overal bed mobility: Modified Independent                  Transfers Overall transfer level: Needs assistance Equipment used: None Transfers: Sit to/from Stand Sit to Stand: Modified independent (Device/Increase time)                 Balance Overall balance assessment: Needs assistance Sitting-balance support: Feet supported, No upper extremity supported Sitting balance-Leahy Scale: Good Sitting balance - Comments: steady sitting reaching within BOS   Standing balance support: Bilateral upper extremity supported, During functional activity, No upper extremity supported Standing balance-Leahy Scale: Good Standing balance comment: stood at EOB                           ADL either performed or assessed with clinical judgement   ADL                                         General ADL Comments: supervision - mod I for self care tasks (toileting, grooming in standing, and making bed)    Extremity/Trunk Assessment Upper Extremity Assessment Upper Extremity Assessment: Generalized weakness   Lower Extremity Assessment Lower Extremity Assessment: Generalized weakness   Cervical / Trunk Assessment Cervical / Trunk Assessment: Normal    Vision Baseline Vision/History:  1 Wears glasses Patient Visual Report: No change from baseline            Cognition Arousal/Alertness: Awake/alert Behavior During Therapy: WFL for tasks assessed/performed Overall Cognitive Status: No family/caregiver present to determine baseline cognitive functioning                                 General Comments: Oriented to at least self and  location; less confusion noted today                   Pertinent Vitals/ Pain       Pain Assessment Pain Assessment: No/denies pain         Frequency  Min 2X/week        Progress Toward Goals  OT Goals(current goals can now be found in the care plan section)  Progress towards OT goals: Progressing toward goals  Acute Rehab OT Goals Patient Stated Goal: to go home OT Goal Formulation: With patient Time For Goal Achievement: 01/29/22 Potential to Achieve Goals: Good  Plan Discharge plan remains appropriate;Frequency remains appropriate       AM-PAC OT "6 Clicks" Daily Activity     Outcome Measure   Help from another person eating meals?: None Help from another person taking care of personal grooming?: None Help from another person toileting, which includes using toliet, bedpan, or urinal?: None Help from another person bathing (including washing, rinsing, drying)?: None Help from another person to put on and taking off regular upper body clothing?: None Help from another person to put on and taking off regular lower body clothing?: None 6 Click Score: 24    End of Session    OT Visit Diagnosis: Unsteadiness on feet (R26.81);Muscle weakness (generalized) (M62.81)   Activity Tolerance Patient tolerated treatment well   Patient Left in bed;with call bell/phone within reach;with bed alarm set   Nurse Communication Mobility status        Time: 1425-1450 OT Time Calculation (min): 25 min  Charges: OT General Charges $OT Visit: 1 Visit OT Treatments $Self Care/Home Management : 23-37 mins  Darleen Crocker, MS, OTR/L , CBIS ascom [phone]  01/22/22, 3:57 PM

## 2022-02-07 ENCOUNTER — Non-Acute Institutional Stay: Payer: Medicare Other | Admitting: Nurse Practitioner

## 2022-02-07 ENCOUNTER — Encounter: Payer: Self-pay | Admitting: Nurse Practitioner

## 2022-02-07 VITALS — BP 147/78 | HR 92 | Temp 97.6°F | Resp 18 | Wt 78.4 lb

## 2022-02-07 DIAGNOSIS — E43 Unspecified severe protein-calorie malnutrition: Secondary | ICD-10-CM

## 2022-02-07 DIAGNOSIS — R630 Anorexia: Secondary | ICD-10-CM

## 2022-02-07 DIAGNOSIS — R531 Weakness: Secondary | ICD-10-CM

## 2022-02-07 DIAGNOSIS — Z515 Encounter for palliative care: Secondary | ICD-10-CM

## 2022-02-07 NOTE — Progress Notes (Signed)
[facility] Palliative Care Consult Note Telephone: [phone]  Fax: [phone]   Date of encounter: 02/07/22 6:32 PM PATIENT NAME: Christine [name] [place] [address]   [phone] (home)  DOB: Dec 01, 1972 MRN: [id number] PRIMARY CARE PROVIDER:    Baxter Hire, MD,  [place] [address] [phone]  REFERRING PROVIDER:  Martinique Miller NP $[email or web address]$ ;  Baxter Hire, MD [place],   [address] [phone]  RESPONSIBLE PARTY:   self;  Christine [name] son  I met face to face with patient in facility. Palliative Care was asked to follow this patient by consultation request of  Martinique Miller NP at [facility], Chrystie Nose, MD to address advance care planning and complex medical decision making. This is the initial visit.                                   ASSESSMENT AND PLAN / RECOMMENDATIONS:  Symptom Management/Plan: 1. Advance Care Planning;  DNR  2. Goals of Care: Goals include to maximize quality of life and symptom management. Our advance care planning conversation included a discussion about:    The value and importance of advance care planning  Exploration of personal, cultural or spiritual beliefs that might influence medical decisions  Exploration of goals of care in the event of a sudden injury or illness  Identification and preparation of a healthcare agent  Review and updating or creation of an advance directive document.  3. Generalized weakness secondary to deconditioning; continue to encourage oob, work with therapy with ltc planning; fall precautions  4. Anorexia progressive, discussed weights, appetite, nutritional education, snacks, supplements. Continue to monitor weights,  02/05/2022 weight 78.4 lbs BMI 13.9 5. Palliative care encounter; Palliative care encounter; Palliative medicine team will continue to support patient, patient's family, and medical team. Visit  consisted of counseling and education dealing with the complex and emotionally intense issues of symptom management and palliative care in the setting of serious and potentially life-threatening illness  Follow up Palliative Care Visit: Palliative care will continue to follow for complex medical decision making, advance care planning, and clarification of goals. Return 1 weeks or prn.  I spent 47 minutes providing this consultation at 10:15 am. More than 50% of the time in this consultation was spent in counseling and care coordination. PPS: 40%  Chief Complaint: Initial palliative consult for complex medical decision making, address goals, manage ongoing symptoms .   HISTORY OF PRESENT ILLNESS:  Christine [name] is a 76 y.o. year old female  with multiple medical problems including CAD s/p AICD, chronic systolic heart failure (HFrEF),  h/o left lower extremity ischemia (s/p of reconstruction byb VVS), h/o breast cancer (on tamoxifen), severe PCM, HTN, COPD, GERD, dyslipidemia, OA, anxiety, tobacco abuse.  Hospitalized 09/28/2021 to 10/03/2021 for chest pain; put on imdur  Hospitalized 11/09/2021 to 11/10/2021 for dizziness workup significant for UTI  Hospitalized 12/03/2021 to 12/05/2021 for progressive left lower extremity swelling and pain; workup significant for lower extremity ischemia, placed on heparin with reduced EF 25-30% with hypoxemia. D-dimer +; had angiography on 12/04/2021 with successful reconstruction of distal aorta and left iliac arteries.   Hospitalized 01/14/2022 to 01/21/2022 for shortness of breath, ams, hallucinations. Chronic open wound of left chest wall: wound cx grew enterobacter, klebsiella. Completed abx course.   Christine [name] was stabilized and d/c to [facility] for  STR where she currently resides. Staff endorses Christine [name] requires assistance with mobility, transfers, able to sit up in w/c, bathing, dressing ADL's, feeds herself with decrease appetite with current weight 78.4 lbs and BMI  13.9. Staff endorses no new concerns. At present Christine [name] is lying in bed, appears very thin, makes eye contact, interactive though forgetful. We talked about being at home prior to hospitalization, multiple hospitalizations, past medical history, though limited. We talked about symptoms, ros, appetite, weights, food likes/dislikes, nutritional education, supplements, snacks, encouraged Christine [name] to eat; discussed fall risk, therapy with medical goals and generalized weakness. We talked about bowels pattern, regimen. We talked about sleep patterns, hygiene. We talked about overall staying at STR, quality of life, support provided. We talked about expectations. Therapeutic listening, emotional support provided. We talked about pc to continue to follow at facility and following d/c or transition to ltc. No new changes recommended for today, continue to monitor. Questions answered. Updated staff.   History obtained from review of EMR, discussion with primary team, and interview with family, facility staff/caregiver and/or Christine [name].  I reviewed available labs, medications, imaging, studies and related documents from the EMR.  Records reviewed and summarized above.   ROS 10 point system reviewed all negative except HPI  Physical Exam: Constitutional: NAD General: frail appearing, cachetic pleasant female EYES: lids intact ENMT: oral mucous membranes moist CV: S1S2, RRR Pulmonary: LCTA, no increased work of breathing, room air Abdomen: soft and non tender MSK: +muscle wasting; Skin: warm and dry Neuro:  + generalized weakness Psych: non-anxious affect, A and O x 3 CURRENT PROBLEM LIST:  Patient Active Problem List   Diagnosis Date Noted   Protein-calorie malnutrition, severe 01/21/2022   Acute on chronic combined systolic and diastolic CHF (congestive heart failure) ([place]) 01/14/2022   HLD (hyperlipidemia) 01/14/2022   Open wound_left chest wall, chronic 81/82/9937   Acute metabolic  encephalopathy 16/96/7893   CAD (coronary artery disease) 01/14/2022   Underweight 01/14/2022   PAD (peripheral artery disease) ([place]) 01/14/2022   Chronic kidney disease, stage 3a ([place]) 01/14/2022   Open chest wound, left, initial encounter    Critical limb ischemia of left lower extremity ([place]) 12/04/2021   HFrEF (heart failure with reduced ejection fraction) ([place]) 12/04/2021   Acute lower limb ischemia 12/03/2021   Urinary tract infection 11/10/2021   Dizziness 11/09/2021   Coronary artery disease without angina pectoris 11/09/2021   History of breast cancer 11/09/2021   GERD without esophagitis 11/09/2021   Cognitive decline 10/01/2021   Chest pain 09/28/2021   Thrombocytopenia ([place]) 09/28/2021   AAA (abdominal aortic aneurysm) without rupture ([place]) 03/13/2019   Breast cancer of upper-inner quadrant of right female breast ([place]) 07/01/2016   Chronic obstructive pulmonary disease ([place]) 04/05/2014   Anxiety 11/25/2013   Breast cancer ([place]) 11/25/2013   Depression 11/25/2013   Implantable cardioverter-defibrillator (ICD) in situ 11/09/2008   ABNORMAL THYROID FUNCTION TESTS 07/30/2007   Essential hypertension 11/16/2006   FAILURE, SYSTOLIC HEART, CHRONIC 81/06/7508   PAST MEDICAL HISTORY:  Active Ambulatory Problems    Diagnosis Date Noted   Essential hypertension 11/16/2006   FAILURE, SYSTOLIC HEART, CHRONIC 25/85/2778   ABNORMAL THYROID FUNCTION TESTS 07/30/2007   Implantable cardioverter-defibrillator (ICD) in situ 11/09/2008   Breast cancer of upper-inner quadrant of right female breast ([place]) 07/01/2016   Anxiety 11/25/2013   Breast cancer ([place]) 11/25/2013   Chronic obstructive pulmonary disease ([place]) 04/05/2014   Depression 11/25/2013   AAA (abdominal aortic aneurysm) without rupture ([place]) 03/13/2019  Chest pain 09/28/2021   Thrombocytopenia ([place]) 09/28/2021   Cognitive decline 10/01/2021   Dizziness 11/09/2021   Coronary artery disease without angina pectoris  11/09/2021   History of breast cancer 11/09/2021   GERD without esophagitis 11/09/2021   Urinary tract infection 11/10/2021   Acute lower limb ischemia 12/03/2021   Critical limb ischemia of left lower extremity ([place]) 12/04/2021   HFrEF (heart failure with reduced ejection fraction) ([place]) 12/04/2021   Acute on chronic combined systolic and diastolic CHF (congestive heart failure) ([place]) 01/14/2022   HLD (hyperlipidemia) 01/14/2022   Open wound_left chest wall, chronic 59/93/5701   Acute metabolic encephalopathy 77/93/9030   CAD (coronary artery disease) 01/14/2022   Underweight 01/14/2022   PAD (peripheral artery disease) ([place]) 01/14/2022   Open chest wound, left, initial encounter    Chronic kidney disease, stage 3a ([place]) 01/14/2022   Protein-calorie malnutrition, severe 01/21/2022   Resolved Ambulatory Problems    Diagnosis Date Noted   Hyperlipidemia 11/16/2006   ANEMIA 07/30/2007   Coronary atherosclerosis 11/16/2006   CARDIOMYOPATHY, ISCHEMIC 11/09/2008   ALLERGIC RHINITIS 11/16/2006   GERD 11/16/2006   Primary osteoarthritis involving multiple joints 11/16/2006   BACK PAIN 11/16/2006   Thrombus 03/17/2011   Current tobacco use 04/05/2014   Headache 10/31/2014   Moderate single current episode of major depressive disorder ([place]) 07/16/2015   Pain in both lower extremities 03/31/2017   Major depressive disorder, recurrent, moderate ([place]) 04/09/2017   Pure hypercholesterolemia 04/09/2017   Skin ulcer with fat layer exposed ([place]) 01/16/2018   Weakness generalized 04/10/2017   Postmenopausal 07/01/2018   Atherosclerosis of native arteries of extremity with rest pain ([place]) 02/13/2019   Leg swelling 02/13/2019   Renal artery stenosis ([facility]) 03/13/2019   Ischemic foot ulcer due to atherosclerosis of native artery of limb ([place]) 12/03/2021   Past Medical History:  Diagnosis Date   Allergic rhinitis    Coronary artery disease    GERD (gastroesophageal reflux disease)    Heart  murmur    Hypertension    Implantable defibrillator [employer]    MI (myocardial infarction) ([place])    Osteoarthritis    Wears dentures    SOCIAL HX:  Social History   Tobacco Use   Smoking status: Every Day    Packs/day: 0.50    Years: 12.00    Total pack years: 6.00    Types: Cigarettes   Smokeless tobacco: Never   Tobacco comments:       Substance Use Topics   Alcohol use: No    Comment: 2-3 times per month   FAMILY HX:  Family History  Problem Relation Age of Onset   Stroke Mother 28   Heart attack Father    Coronary artery disease Father    Heart attack Brother    Coronary artery disease Other    Hypertension Other    Cancer Neg Hx        No breast or colon cancer      ALLERGIES:  Allergies  Allergen Reactions   Atorvastatin Other (See Comments)    increased LFT's   Lisinopril Cough   Oxycodone Hcl Itching and Other (See Comments)    Restless & jittery   Simvastatin Other (See Comments)    myalgias     PERTINENT MEDICATIONS:  Outpatient Encounter Medications as of 02/07/2022  Medication Sig   aspirin EC 81 MG tablet Take 81 mg by mouth daily.   bisacodyl (DULCOLAX) 5 MG EC tablet Take 5 mg by mouth daily as  needed for mild constipation or moderate constipation.   carvedilol (COREG) 3.125 MG tablet Take 1 tablet (3.125 mg total) by mouth 2 (two) times daily with a meal.   clopidogrel (PLAVIX) 75 MG tablet TAKE 1 TABLET BY MOUTH ONCE DAILY (Patient taking differently: Take 75 mg by mouth daily.)   furosemide (LASIX) 20 MG tablet Take 1 tablet (20 mg total) by mouth daily.   hydrOXYzine (ATARAX) 25 MG tablet Take 25 mg by mouth 3 (three) times daily.   isosorbide mononitrate (IMDUR) 30 MG 24 hr tablet Take 1 tablet (30 mg total) by mouth daily. (Patient not taking: Reported on 01/14/2022)   LORazepam (ATIVAN) 0.5 MG tablet Take 0.5 tablets (0.25 mg total) by mouth 2 (two) times daily as needed for anxiety.   pantoprazole (PROTONIX) 40 MG tablet Take 40 mg by mouth  daily.    Polyethylene Glycol 3350 (PEG 3350) POWD Take 17 g by mouth daily as needed (constipation.).    QUEtiapine (SEROQUEL) 25 MG tablet Take 1 tablet (25 mg total) by mouth at bedtime.   rosuvastatin (CRESTOR) 10 MG tablet Take 10 mg by mouth daily.    tamoxifen (NOLVADEX) 20 MG tablet Take 1 tablet (20 mg total) by mouth daily.   traMADol (ULTRAM) 50 MG tablet Take 25 mg by mouth 2 (two) times daily.   valsartan (DIOVAN) 320 MG tablet Take 1 tablet (320 mg total) by mouth daily at 12 noon. Hold this medication until you see your cardiologist   No facility-administered encounter medications on file as of 02/07/2022.   Thank you for the opportunity to participate in the care of Ms. Rolfe.  The palliative care team will continue to follow. Please call our office at [phone] if we can be of additional assistance.   Tru Rana Z Samanvi Cuccia, NP ,

## 2022-03-21 ENCOUNTER — Non-Acute Institutional Stay: Payer: Medicare Other | Admitting: Nurse Practitioner

## 2022-03-21 ENCOUNTER — Encounter: Payer: Self-pay | Admitting: Nurse Practitioner

## 2022-03-21 VITALS — BP 122/68 | HR 86 | Temp 97.7°F | Resp 18 | Wt 86.1 lb

## 2022-03-21 DIAGNOSIS — R531 Weakness: Secondary | ICD-10-CM

## 2022-03-21 DIAGNOSIS — E43 Unspecified severe protein-calorie malnutrition: Secondary | ICD-10-CM

## 2022-03-21 DIAGNOSIS — R630 Anorexia: Secondary | ICD-10-CM

## 2022-03-21 DIAGNOSIS — Z515 Encounter for palliative care: Secondary | ICD-10-CM

## 2022-03-21 NOTE — Progress Notes (Signed)
Therapist, nutritional Palliative Care Consult Note Telephone: [phone]  Fax: [phone]    Date of encounter: 03/21/22 1:01 PM PATIENT NAME: Christine [name] [address] [place] [address]   [phone] (home)  DOB: [birth year] MRN: [id number] PRIMARY CARE PROVIDER:   [facility] Gracelyn Nurse, MD,  [address] [place] [address] [phone]  RESPONSIBLE PARTY:    Contact Information     Name Relation Home Work Mobile   Christine [name] Niece   [phone]   Christine [name]   [phone]       I met face to face with patient in facility. Palliative Care was asked to follow this patient by consultation request of  [place] Miller NP at [facility], Beulah Gandy, MD to address advance care planning and complex medical decision making. This is the initial visit.                                   ASSESSMENT AND PLAN / RECOMMENDATIONS:  Symptom Management/Plan: 1. Advance Care Planning;  DNR   2. Goals of Care: Goals include to maximize quality of life and symptom management. Our advance care planning conversation included a discussion about:    The value and importance of advance care planning  Exploration of personal, cultural or spiritual beliefs that might influence medical decisions  Exploration of goals of care in the event of a sudden injury or illness  Identification and preparation of a healthcare agent  Review and updating or creation of an advance directive document.   3. Generalized weakness improving secondary to deconditioning; continue to encourage oob, self independence, fall precautions, participate in activities at the facilty   4. Anorexia/protein calorie malnutrition weight gain, discussed weights, appetite, nutritional education, snacks, supplements. Continue to monitor weights,  02/05/2022 weight 78.4 lbs 03/20/2022 weight 86.1 lbs  5. Palliative care encounter; Palliative care encounter;  Palliative medicine team will continue to support patient, patient's family, and medical team. Visit consisted of counseling and education dealing with the complex and emotionally intense issues of symptom management and palliative care in the setting of serious and potentially life-threatening illness   Follow up Palliative Care Visit: Palliative care will continue to follow for complex medical decision making, advance care planning, and clarification of goals.    I spent 37 minutes providing this consultation at 10:15 am. More than 50% of the time in this consultation was spent in counseling and care coordination. PPS: 40%   Chief Complaint: Initial palliative consult for complex medical decision making, address goals, manage ongoing symptoms .    HISTORY OF PRESENT ILLNESS:  Christine [name] is a 76 y.o. year old female  with multiple medical problems including CAD s/p AICD, chronic systolic heart failure (HFrEF),  h/o left lower extremity ischemia (s/p of reconstruction byb VVS), h/o breast cancer (on tamoxifen), severe PCM, HTN, COPD, GERD, dyslipidemia, OA, anxiety, tobacco abuse. Christine [name] continues to reside at [facility] Christine [name] is able to toilet herself though requires assistance with ADL's. Appetite has been adequate. Patient has had an 8 pound weight gain since August. Christine [name] is ambulatory. No recent falls reported needing assistance with bathing, and dressing. Ms [name] is able to feed herself. Fungating mass present on anterior left chest that is managed by facility wound care provider. Staff reports no other changes. At present Christine [name] is sitting on  the bed, appears comfortable, makes eye contact. Christine [name] reviewed ros, current functional abilities. Christine [name] expressed she was "very happy" about being in the facility. Christine [name] was cooperative, engaging, interactive. Christine [name] denies pain.She reports that she has no difficulty getting or staying asleep. Reviewed  appetite, medical goals. Support provided. Encouraged and educated meals, weights reviewed, medications. Attempted to contact her niece for update on visit, no answer, message left with contact information.  No changes recommended today to goals care will continue to follow for monitor chronic disease, support. Updated staff.    Therapeutic listening, emotional support provided. Emotional support provided History obtained from review of EMR, discussion with facility staff and Christine [name].  I reviewed available labs, medications, imaging, studies and related documents from the EMR.  Records reviewed and summarized above.    ROS 10 point system reviewed all negative except HPI   Physical Exam: Constitutional: NAD General: frail appearing, cachetic pleasant female EYES: lids intact ENMT: oral mucous membranes moist CV: S1S2, RRR Pulmonary: crackles present in bilateral bases, no increased work of breathing, room air Abdomen: soft and non tender MSK: +muscle wasting; Skin: warm and dry Neuro:  + generalized weakness Psych: non-anxious affect, A and O x 3 Thank you for the opportunity to participate in the care of Christine [name].  The palliative care team will continue to follow. Please call our office at [phone] if we can be of additional assistance.   Carrigan Delafuente Ihor Gully, NP

## 2022-04-08 ENCOUNTER — Other Ambulatory Visit (INDEPENDENT_AMBULATORY_CARE_PROVIDER_SITE_OTHER): Payer: Self-pay | Admitting: Vascular Surgery

## 2022-04-08 DIAGNOSIS — I739 Peripheral vascular disease, unspecified: Secondary | ICD-10-CM

## 2022-04-08 DIAGNOSIS — I70219 Atherosclerosis of native arteries of extremities with intermittent claudication, unspecified extremity: Secondary | ICD-10-CM | POA: Insufficient documentation

## 2022-04-08 DIAGNOSIS — I712 Thoracic aortic aneurysm, without rupture, unspecified: Secondary | ICD-10-CM | POA: Insufficient documentation

## 2022-04-08 NOTE — Progress Notes (Unsigned)
MRN : [id number]  Christine [name] is a 76 y.o. (02-13-1946) female who presents with chief complaint of check circulation.  History of Present Illness: The patient returns to the office for followup and review status post angiogram with intervention on 11/15/2021.   Procedure:  Percutaneous transluminal angioplasty and stent placement left external iliac artery to 6 mm 2.    Percutaneous transluminal angioplasty infrarenal aorta to 8 mm 3.    Percutaneous transluminal and plasty and stent placement left common iliac artery  The patient notes improvement in the lower extremity symptoms. No interval shortening of the patient's claudication distance or rest pain symptoms. No new ulcers or wounds have occurred since the last visit.  The patient is also followed for an ascending thoracic aortic aneurysm. The aneurysm was found incidentally by CT scan. Patient denies chest pain or unusual back pain, no other chest or abdominal complaints.  No history of an abrupt onset of a painful toe associated with blue discoloration.     No family history of TAA/AAA.   There have been no significant changes to the patient's overall health care.  No documented history of amaurosis fugax or recent TIA symptoms. There are no recent neurological changes noted. No documented history of DVT, PE or superficial thrombophlebitis. The patient denies recent episodes of angina or shortness of breath.   CT angiogram of the chest dated 12/12/2021 shows an ascending thoracic aortic aneurysm of 39.4 cm and a descending thoracic aneurysm of 29.0 cm  ABI's Rt=*** and Lt=***  (previous ABI's Rt=*** and Lt=***) Duplex US of the *** lower extremity arterial system shows ***   No outpatient medications have been marked as taking for the 04/10/22 encounter (Appointment) with Delana Meyer, Dolores Lory, MD.    Past Medical History:  Diagnosis Date   Allergic rhinitis    Anxiety    Breast cancer ([place])    11/26/20 Pt  reports wound/lesion on left breast   Chronic systolic heart failure ([place])    Coronary artery disease    GERD (gastroesophageal reflux disease)    Heart murmur    Hyperlipidemia    Hypertension    Implantable defibrillator [employer]    DOI 2006   MI (myocardial infarction) ([place])    Osteoarthritis    Wears dentures    full upper and lower    Past Surgical History:  Procedure Laterality Date   CHF exacerbation  10/07   CORONARY STENT PLACEMENT  8/06   3 stents; myocardial infarction   LOWER EXTREMITY ANGIOGRAPHY Left 02/15/2019   Procedure: LOWER EXTREMITY ANGIOGRAPHY;  Surgeon: Katha Cabal, MD;  Location: [facility];  Service: Cardiovascular;  Laterality: Left;   LOWER EXTREMITY ANGIOGRAPHY Left 12/04/2021   Procedure: Lower Extremity Angiography;  Surgeon: Katha Cabal, MD;  Location: [facility];  Service: Cardiovascular;  Laterality: Left;   NM MYOVIEW LTD  1/08   EF 39%, no sx ischemia   PACEMAKER PLACEMENT  2/07   Defibrillator   RENAL ARTERY STENT Left     Social History Social History   Tobacco Use   Smoking status: Every Day    Packs/day: 0.50    Years: 12.00    Total pack years: 6.00    Types: Cigarettes   Smokeless tobacco: Never   Tobacco comments:       Vaping Use   Vaping Use: Never used  Substance Use  Topics   Alcohol use: No    Comment: 2-3 times per month   Drug use: No    Family History Family History  Problem Relation Age of Onset   Stroke Mother 105   Heart attack Father    Coronary artery disease Father    Heart attack Brother    Coronary artery disease Other    Hypertension Other    Cancer Neg Hx        No breast or colon cancer    Allergies  Allergen Reactions   Atorvastatin Other (See Comments)    increased LFT's   Lisinopril Cough   Oxycodone Hcl Itching and Other (See Comments)    Restless & jittery   Simvastatin Other (See Comments)    myalgias     REVIEW OF SYSTEMS (Negative unless  checked)  Constitutional: '[]'$ Weight loss  '[]'$ Fever  '[]'$ Chills Cardiac: '[]'$ Chest pain   '[]'$ Chest pressure   '[]'$ Palpitations   '[]'$ Shortness of breath when laying flat   '[]'$ Shortness of breath with exertion. Vascular:  '[x]'$ Pain in legs with walking   '[]'$ Pain in legs at rest  '[]'$ History of DVT   '[]'$ Phlebitis   '[]'$ Swelling in legs   '[]'$ Varicose veins   '[]'$ Non-healing ulcers Pulmonary:   '[]'$ Uses home oxygen   '[]'$ Productive cough   '[]'$ Hemoptysis   '[]'$ Wheeze  '[]'$ COPD   '[]'$ Asthma Neurologic:  '[]'$ Dizziness   '[]'$ Seizures   '[]'$ History of stroke   '[]'$ History of TIA  '[]'$ Aphasia   '[]'$ Vissual changes   '[]'$ Weakness or numbness in arm   '[]'$ Weakness or numbness in leg Musculoskeletal:   '[]'$ Joint swelling   '[]'$ Joint pain   '[]'$ Low back pain Hematologic:  '[]'$ Easy bruising  '[]'$ Easy bleeding   '[]'$ Hypercoagulable state   '[]'$ Anemic Gastrointestinal:  '[]'$ Diarrhea   '[]'$ Vomiting  '[]'$ Gastroesophageal reflux/heartburn   '[]'$ Difficulty swallowing. Genitourinary:  '[]'$ Chronic kidney disease   '[]'$ Difficult urination  '[]'$ Frequent urination   '[]'$ Blood in urine Skin:  '[]'$ Rashes   '[]'$ Ulcers  Psychological:  '[]'$ History of anxiety   '[]'$  History of major depression.  Physical Examination  There were no vitals filed for this visit. There is no height or weight on file to calculate BMI. Gen: WD/WN, NAD Head: [place]/AT, No temporalis wasting.  Ear/Nose/Throat: Hearing grossly intact, nares w/o erythema or drainage Eyes: PER, EOMI, sclera nonicteric.  Neck: Supple, no masses.  No bruit or JVD.  Pulmonary:  Good air movement, no audible wheezing, no use of accessory muscles.  Cardiac: RRR, normal S1, S2, no Murmurs. Vascular:  mild trophic changes, no open wounds Vessel Right Left  Radial Palpable Palpable  PT Not Palpable Not Palpable  DP Not Palpable Not Palpable  Gastrointestinal: soft, non-distended. No guarding/no peritoneal signs.  Musculoskeletal: M/S 5/5 throughout.  No visible deformity.  Neurologic: CN 2-12 intact. Pain and light touch intact in extremities.  Symmetrical.   Speech is fluent. Motor exam as listed above. Psychiatric: Judgment intact, Mood & affect appropriate for pt's clinical situation. Dermatologic: No rashes or ulcers noted.  No changes consistent with cellulitis.   CBC Lab Results  Component Value Date   WBC 7.7 01/19/2022   HGB 11.1 (L) 01/19/2022   HCT 35.8 (L) 01/19/2022   MCV 91.3 01/19/2022   PLT 119 (L) 01/19/2022    BMET    Component Value Date/Time   NA 139 01/19/2022 0442   NA 142 05/03/2014 1036   NA 141 01/17/2014 1551   K 4.6 01/19/2022 0442   K 3.6 01/17/2014 1551   CL 104 01/19/2022 0442   CL 107  01/17/2014 1551   CO2 26 01/19/2022 0442   CO2 27 01/17/2014 1551   GLUCOSE 113 (H) 01/19/2022 0442   GLUCOSE 86 01/17/2014 1551   BUN 44 (H) 01/19/2022 0442   BUN 6 (L) 05/03/2014 1036   BUN 6 (L) 01/17/2014 1551   CREATININE 1.10 (H) 01/19/2022 0442   CREATININE 0.94 04/03/2014 1336   CALCIUM 8.6 (L) 01/19/2022 0442   CALCIUM 8.5 01/17/2014 1551   GFRNONAA 53 (L) 01/19/2022 0442   GFRNONAA >60 04/03/2014 1336   GFRNONAA >60 01/17/2014 1551   GFRAA >60 02/15/2019 0735   GFRAA >60 04/03/2014 1336   GFRAA >60 01/17/2014 1551   CrCl cannot be calculated (Patient's most recent lab result is older than the maximum 21 days allowed.).  COAG Lab Results  Component Value Date   INR 1.3 (H) 12/04/2021   INR 1.2 12/03/2021   INR 1.0 05/03/2014    Radiology No results found.   Assessment/Plan There are no diagnoses linked to this encounter.   Hortencia Pilar, MD  04/08/2022 9:40 PM

## 2022-04-10 ENCOUNTER — Encounter (INDEPENDENT_AMBULATORY_CARE_PROVIDER_SITE_OTHER): Payer: Self-pay | Admitting: Vascular Surgery

## 2022-04-10 ENCOUNTER — Ambulatory Visit (INDEPENDENT_AMBULATORY_CARE_PROVIDER_SITE_OTHER): Payer: Medicare Other | Admitting: Vascular Surgery

## 2022-04-10 ENCOUNTER — Ambulatory Visit (INDEPENDENT_AMBULATORY_CARE_PROVIDER_SITE_OTHER): Payer: Medicare Other

## 2022-04-10 VITALS — BP 194/109 | HR 90 | Resp 15

## 2022-04-10 DIAGNOSIS — Z9889 Other specified postprocedural states: Secondary | ICD-10-CM

## 2022-04-10 DIAGNOSIS — E785 Hyperlipidemia, unspecified: Secondary | ICD-10-CM

## 2022-04-10 DIAGNOSIS — I10 Essential (primary) hypertension: Secondary | ICD-10-CM

## 2022-04-10 DIAGNOSIS — I2510 Atherosclerotic heart disease of native coronary artery without angina pectoris: Secondary | ICD-10-CM

## 2022-04-10 DIAGNOSIS — I739 Peripheral vascular disease, unspecified: Secondary | ICD-10-CM | POA: Diagnosis not present

## 2022-04-10 DIAGNOSIS — J449 Chronic obstructive pulmonary disease, unspecified: Secondary | ICD-10-CM

## 2022-04-10 DIAGNOSIS — I712 Thoracic aortic aneurysm, without rupture, unspecified: Secondary | ICD-10-CM

## 2022-04-10 DIAGNOSIS — I70213 Atherosclerosis of native arteries of extremities with intermittent claudication, bilateral legs: Secondary | ICD-10-CM

## 2022-04-16 ENCOUNTER — Emergency Department: Payer: Medicare Other

## 2022-04-16 ENCOUNTER — Encounter: Payer: Self-pay | Admitting: Emergency Medicine

## 2022-04-16 ENCOUNTER — Other Ambulatory Visit: Payer: Self-pay

## 2022-04-16 ENCOUNTER — Emergency Department
Admission: EM | Admit: 2022-04-16 | Discharge: 2022-04-16 | Disposition: A | Discharge status: Skilled Nursing Facility | Payer: Medicare Other | Attending: Emergency Medicine | Admitting: Emergency Medicine

## 2022-04-16 DIAGNOSIS — Z853 Personal history of malignant neoplasm of breast: Secondary | ICD-10-CM | POA: Insufficient documentation

## 2022-04-16 DIAGNOSIS — F0390 Unspecified dementia without behavioral disturbance: Secondary | ICD-10-CM | POA: Insufficient documentation

## 2022-04-16 DIAGNOSIS — I739 Peripheral vascular disease, unspecified: Secondary | ICD-10-CM | POA: Insufficient documentation

## 2022-04-16 DIAGNOSIS — L03116 Cellulitis of left lower limb: Secondary | ICD-10-CM | POA: Diagnosis not present

## 2022-04-16 DIAGNOSIS — M79605 Pain in left leg: Secondary | ICD-10-CM | POA: Diagnosis present

## 2022-04-16 DIAGNOSIS — Z9012 Acquired absence of left breast and nipple: Secondary | ICD-10-CM | POA: Diagnosis not present

## 2022-04-16 LAB — CBC WITH DIFFERENTIAL/PLATELET
Abs Immature Granulocytes: 0.12 10*3/uL — ABNORMAL HIGH (ref 0.00–0.07)
Basophils Absolute: 0 10*3/uL (ref 0.0–0.1)
Basophils Relative: 0 %
Eosinophils Absolute: 0 10*3/uL (ref 0.0–0.5)
Eosinophils Relative: 0 %
HCT: 39.9 % (ref 36.0–46.0)
Hemoglobin: 12.2 g/dL (ref 12.0–15.0)
Immature Granulocytes: 1 %
Lymphocytes Relative: 7 %
Lymphs Abs: 1.1 10*3/uL (ref 0.7–4.0)
MCH: 26.1 pg (ref 26.0–34.0)
MCHC: 30.6 g/dL (ref 30.0–36.0)
MCV: 85.4 fL (ref 80.0–100.0)
Monocytes Absolute: 1.1 10*3/uL — ABNORMAL HIGH (ref 0.1–1.0)
Monocytes Relative: 8 %
Neutro Abs: 12.3 10*3/uL — ABNORMAL HIGH (ref 1.7–7.7)
Neutrophils Relative %: 84 %
Platelets: 152 10*3/uL (ref 150–400)
RBC: 4.67 MIL/uL (ref 3.87–5.11)
RDW: 16.5 % — ABNORMAL HIGH (ref 11.5–15.5)
WBC: 14.7 10*3/uL — ABNORMAL HIGH (ref 4.0–10.5)
nRBC: 0.1 % (ref 0.0–0.2)

## 2022-04-16 LAB — COMPREHENSIVE METABOLIC PANEL
ALT: 21 U/L (ref 0–44)
AST: 37 U/L (ref 15–41)
Albumin: 2.9 g/dL — ABNORMAL LOW (ref 3.5–5.0)
Alkaline Phosphatase: 90 U/L (ref 38–126)
Anion gap: 10 (ref 5–15)
BUN: 30 mg/dL — ABNORMAL HIGH (ref 8–23)
CO2: 26 mmol/L (ref 22–32)
Calcium: 8.4 mg/dL — ABNORMAL LOW (ref 8.9–10.3)
Chloride: 102 mmol/L (ref 98–111)
Creatinine, Ser: 1.13 mg/dL — ABNORMAL HIGH (ref 0.44–1.00)
GFR, Estimated: 51 mL/min — ABNORMAL LOW (ref >60)
Glucose, Bld: 121 mg/dL — ABNORMAL HIGH (ref 70–99)
Potassium: 3.4 mmol/L — ABNORMAL LOW (ref 3.5–5.1)
Sodium: 138 mmol/L (ref 135–145)
Total Bilirubin: 1.8 mg/dL — ABNORMAL HIGH (ref 0.3–1.2)
Total Protein: 6 g/dL — ABNORMAL LOW (ref 6.5–8.1)

## 2022-04-16 LAB — LACTIC ACID, PLASMA
Lactic Acid, Venous: 2.6 mmol/L (ref 0.5–1.9)
Lactic Acid, Venous: 2.7 mmol/L (ref 0.5–1.9)
Lactic Acid, Venous: 1.9 mmol/L (ref 0.5–1.9)

## 2022-04-16 MED ORDER — CEPHALEXIN 500 MG PO CAPS
500.0000 mg | ORAL_CAPSULE | Freq: Once | ORAL | Status: AC
  Administered 2022-04-16: 500 mg via ORAL
  Filled 2022-04-16: qty 1

## 2022-04-16 MED ORDER — IOHEXOL 350 MG/ML SOLN
75.0000 mL | Freq: Once | INTRAVENOUS | Status: AC | PRN
  Administered 2022-04-16: 70 mL via INTRAVENOUS

## 2022-04-16 MED ORDER — SODIUM CHLORIDE 0.9 % IV BOLUS
1000.0000 mL | Freq: Once | INTRAVENOUS | Status: AC
  Administered 2022-04-16: 1000 mL via INTRAVENOUS

## 2022-04-16 MED ORDER — CEPHALEXIN 500 MG PO CAPS
500.0000 mg | ORAL_CAPSULE | Freq: Three times a day (TID) | ORAL | 0 refills | Status: AC
Start: 2022-04-16 — End: 2022-04-26

## 2022-04-16 NOTE — ED Notes (Signed)
Patient confused and trying to take off cardiac leads, pulse ox, and IV. IV removed by patient. Patient cleaned and this RN attempted to reorient patient. Patient resting at this time NAD noted.

## 2022-04-16 NOTE — ED Notes (Signed)
Patient to CT at this time

## 2022-04-16 NOTE — ED Notes (Signed)
Patient's wound on left chest redressed at this time per Joni Fears, MD. No needs expressed to RN at this time. NAD noted.

## 2022-04-16 NOTE — ED Provider Notes (Signed)
[facility] [place] Provider Note    Event Date/Time   First MD Initiated Contact with Patient 04/16/22 1144     (approximate)   History   Chief Complaint: Leg Pain   HPI  Christine [name] is a 76 y.o. female with a history of GERD, dementia, hypertension, heart failure, breast cancer status post left mastectomy, implanted defibrillator who is brought to the ED from [facility] due to left leg pain.  Patient present complaining of left leg pain for several days, and was scheduled to get an ultrasound of the leg yesterday but it was not performed due to her pain.  Patient denies fever, chest pain, shortness of breath, or other acute complaints.  She has a chronic left chest wall wound related to breast cancer.     Physical Exam   Triage Vital Signs: ED Triage Vitals  Enc Vitals Group     BP 04/16/22 1146 (!) 147/88     Pulse Rate 04/16/22 1146 95     Resp 04/16/22 1146 (!) 21     Temp 04/16/22 1146 (!) 96.8 F (36 C)     Temp Source 04/16/22 1146 Axillary     SpO2 04/16/22 1146 99 %     Weight --      Height --      Head Circumference --      Peak Flow --      Pain Score 04/16/22 1141 0     Pain Loc --      Pain Edu? --      Excl. in [place]? --     Most recent vital signs: Vitals:   04/16/22 1340 04/16/22 1448  BP: (!) 154/94 (!) 167/85  Pulse: 85 87  Resp: 12 20  Temp:    SpO2: 94% 94%    General: Awake, no distress.  CV:  Extremities are warm.  Symmetric, somewhat sluggish capillary refill of about 3 seconds in bilateral feet.  No palpable DP or PT pulse.  There is strong Doppler signal of the left DP and PT pulses.  Right DP/PT Doppler is absent, and right popliteal artery is pulsatile on Doppler evaluation. Resp:  Normal effort.  Clear to auscultation bilaterally Abd:  No distention.  Soft, nontender Other:  Symmetric calf circumference.  No calf tenderness.  There is some chronic discoloration bilateral feet, but no inflammatory skin  changes.  No lower extremity open wounds.  There is a large irregular wound of the left anterior chest wall just medial to her defibrillator implant, about 6 cm in diameter extending through the dermis into the subcutaneous fat layer.  No inflammatory changes or purulent discharge.   ED Results / Procedures / Treatments   Labs (all labs ordered are listed, but only abnormal results are displayed) Labs Reviewed  COMPREHENSIVE METABOLIC PANEL - Abnormal; Notable for the following components:      Result Value   Potassium 3.4 (*)    Glucose, Bld 121 (*)    BUN 30 (*)    Creatinine, Ser 1.13 (*)    Calcium 8.4 (*)    Total Protein 6.0 (*)    Albumin 2.9 (*)    Total Bilirubin 1.8 (*)    GFR, Estimated 51 (*)    All other components within normal limits  CBC WITH DIFFERENTIAL/PLATELET - Abnormal; Notable for the following components:   WBC 14.7 (*)    RDW 16.5 (*)    Neutro Abs 12.3 (*)    Monocytes Absolute  1.1 (*)    Abs Immature Granulocytes 0.12 (*)    All other components within normal limits  LACTIC ACID, PLASMA - Abnormal; Notable for the following components:   Lactic Acid, Venous 2.6 (*)    All other components within normal limits  CULTURE, BLOOD (ROUTINE X 2)  CULTURE, BLOOD (ROUTINE X 2)  URINALYSIS, ROUTINE W REFLEX MICROSCOPIC  LACTIC ACID, PLASMA  SAMPLE TO BLOOD BANK     EKG Interpreted by me Atrial fibrillation, rate of 105.  Right axis, normal intervals.  Poor R wave progression.  Normal ST segments and T waves.  No ischemic changes.   RADIOLOGY CT angiogram aortobifem runoff shows contrast perfusion distally to the feet bilaterally.  Distal arteries are diminutive but continuous.  Radiology report reviewed.   PROCEDURES:  Procedures   MEDICATIONS ORDERED IN ED: Medications  iohexol (OMNIPAQUE) 350 MG/ML injection 75 mL (70 mLs Intravenous Contrast Given 04/16/22 1321)     IMPRESSION / MDM / ASSESSMENT AND PLAN / ED COURSE  I reviewed the  triage vital signs and the nursing notes.                              Differential diagnosis includes, but is not limited to, limb ischemia, chronic PVD, cellulitis, doubt DVT/osteomyelitis/septic arthritis/necrotizing fasciitis  Patient's presentation is most consistent with acute presentation with potential threat to life or bodily function.  Patient presents with left leg pain.  She does have Doppler positive pulses distally on the left.  She has a history of AAA, and clear evidence of PAD so I will obtain a CT angiogram of bilateral lower extremities.  She is not septic, no evidence of ACS or PE.    If CT is unremarkable, would plan to obtain ultrasound to rule out DVT, and start on Keflex for the possibility of evolving cellulitis.       FINAL CLINICAL IMPRESSION(S) / ED DIAGNOSES   Final diagnoses:  Left leg pain  PAD (peripheral artery disease) ([place])  Chronic dementia ([place])     Rx / DC Orders   ED Discharge Orders     None        Note:  This document was prepared using Dragon voice recognition software and may include unintentional dictation errors.   Carrie Mew, MD 04/16/22 1454

## 2022-04-16 NOTE — ED Triage Notes (Signed)
Patient to ED via [facility] from [facility] for left leg pain. Patient was suppose to get doppler done on leg yesterday but was unable to withstand due to pain. EMS also reports a cover wound on left chest with green drainage.

## 2022-04-16 NOTE — ED Notes (Signed)
Patient's brief wet. Patient cleaned and new brief applied at this time.

## 2022-04-16 NOTE — Discharge Instructions (Addendum)
It looks like you have some chronic artery obstruction in your legs.  I think that is part of what is causing the pain.  You also seem to have some skin infection there, some cellulitis.  I will give you some Keflex antibiotic 1 pill 3 times a day for that.  Continue your aspirin and Plavix.  That should help somewhat.  Keep your legs elevated is much as you can.  I will have you follow-up with the vascular surgeons.  Dr. Lucky Cowboy is on-call.  I have given you his name and number.  If you call their office they should be able to work you in fairly soon and see what else they can do to help with your problem.

## 2022-04-16 NOTE — ED Notes (Signed)
Purewick placed at this time.

## 2022-04-16 NOTE — ED Notes (Signed)
US @ the bedside. 

## 2022-04-16 NOTE — ED Notes (Signed)
Critical Result: Lactic Acid 2.6  Joni Fears, MD aware.

## 2022-04-21 LAB — CULTURE, BLOOD (ROUTINE X 2)
Culture: NO GROWTH
Culture: NO GROWTH
Special Requests: ADEQUATE

## 2022-05-05 ENCOUNTER — Encounter (INDEPENDENT_AMBULATORY_CARE_PROVIDER_SITE_OTHER): Payer: Self-pay

## 2022-05-05 ENCOUNTER — Ambulatory Visit (INDEPENDENT_AMBULATORY_CARE_PROVIDER_SITE_OTHER): Payer: Medicare Other | Admitting: Vascular Surgery

## 2022-05-05 DIAGNOSIS — I714 Abdominal aortic aneurysm, without rupture, unspecified: Secondary | ICD-10-CM

## 2022-05-05 DIAGNOSIS — I10 Essential (primary) hypertension: Secondary | ICD-10-CM

## 2022-05-05 DIAGNOSIS — I70213 Atherosclerosis of native arteries of extremities with intermittent claudication, bilateral legs: Secondary | ICD-10-CM

## 2022-05-05 DIAGNOSIS — I25119 Atherosclerotic heart disease of native coronary artery with unspecified angina pectoris: Secondary | ICD-10-CM

## 2022-05-05 DIAGNOSIS — I712 Thoracic aortic aneurysm, without rupture, unspecified: Secondary | ICD-10-CM

## 2022-05-16 NOTE — Progress Notes (Deleted)
MRN : [id number]  Christine [name] is a 76 y.o. (03-12-1946) female who presents with chief complaint of check circulation.  History of Present Illness:   The patient returns to the office for followup and review status post angiogram with intervention on 12/04/2021.   Procedure: Percutaneous transluminal angioplasty and stent placement left external iliac artery to 6 mm 2.   Percutaneous transluminal angioplasty infrarenal aorta to 8 mm 3.   Percutaneous transluminal and plasty and stent placement left common iliac artery  The patient notes improvement in the lower extremity symptoms. No interval shortening of the patient's claudication distance or rest pain symptoms. No new ulcers or wounds have occurred since the last visit.  There have been no significant changes to the patient's overall health care.  No documented history of amaurosis fugax or recent TIA symptoms. There are no recent neurological changes noted. No documented history of DVT, PE or superficial thrombophlebitis. The patient denies recent episodes of angina or shortness of breath.   ABI's Rt=*** and Lt=***  (previous ABI's Rt=*** and Lt=***) Duplex US of the *** lower extremity arterial system shows ***  No outpatient medications have been marked as taking for the 05-27-2022 encounter (Appointment) with Delana Meyer, Dolores Lory, MD.    Past Medical History:  Diagnosis Date   Allergic rhinitis    Anxiety    Breast cancer ([place])    11/26/20 Pt reports wound/lesion on left breast   Chronic systolic heart failure ([place])    Coronary artery disease    GERD (gastroesophageal reflux disease)    Heart murmur    Hyperlipidemia    Hypertension    Implantable defibrillator [employer]    DOI 2006   MI (myocardial infarction) ([place])    Osteoarthritis    Wears dentures    full upper and lower    Past Surgical History:  Procedure Laterality Date   CHF exacerbation  10/07   CORONARY STENT PLACEMENT  8/06   3  stents; myocardial infarction   LOWER EXTREMITY ANGIOGRAPHY Left 02/15/2019   Procedure: LOWER EXTREMITY ANGIOGRAPHY;  Surgeon: Katha Cabal, MD;  Location: [facility] [facility];  Service: Cardiovascular;  Laterality: Left;   LOWER EXTREMITY ANGIOGRAPHY Left 12/04/2021   Procedure: Lower Extremity Angiography;  Surgeon: Katha Cabal, MD;  Location: [facility] [facility];  Service: Cardiovascular;  Laterality: Left;   NM MYOVIEW LTD  1/08   EF 39%, no sx ischemia   PACEMAKER PLACEMENT  2/07   Defibrillator   RENAL ARTERY STENT Left     Social History Social History   Tobacco Use   Smoking status: Former    Packs/day: 0.50    Years: 12.00    Total pack years: 6.00    Types: Cigarettes   Smokeless tobacco: Never   Tobacco comments:       Vaping Use   Vaping Use: Never used  Substance Use Topics   Alcohol use: No    Comment: 2-3 times per month   Drug use: No    Family History Family History  Problem Relation Age of Onset   Stroke Mother 26   Heart attack Father    Coronary artery disease Father    Heart attack Brother    Coronary artery disease Other    Hypertension Other    Cancer Neg Hx        No breast or colon cancer  Allergies  Allergen Reactions   Atorvastatin Other (See Comments)    increased LFT's   Lisinopril Cough   Oxycodone Hcl Itching and Other (See Comments)    Restless & jittery   Simvastatin Other (See Comments)    myalgias     REVIEW OF SYSTEMS (Negative unless checked)  Constitutional: '[]'$ Weight loss  '[]'$ Fever  '[]'$ Chills Cardiac: '[]'$ Chest pain   '[]'$ Chest pressure   '[]'$ Palpitations   '[]'$ Shortness of breath when laying flat   '[]'$ Shortness of breath with exertion. Vascular:  '[x]'$ Pain in legs with walking   '[]'$ Pain in legs at rest  '[]'$ History of DVT   '[]'$ Phlebitis   '[]'$ Swelling in legs   '[]'$ Varicose veins   '[]'$ Non-healing ulcers Pulmonary:   '[]'$ Uses home oxygen   '[]'$ Productive cough   '[]'$ Hemoptysis   '[]'$ Wheeze  '[]'$ COPD   '[]'$ Asthma Neurologic:   '[]'$ Dizziness   '[]'$ Seizures   '[]'$ History of stroke   '[]'$ History of TIA  '[]'$ Aphasia   '[]'$ Vissual changes   '[]'$ Weakness or numbness in arm   '[]'$ Weakness or numbness in leg Musculoskeletal:   '[]'$ Joint swelling   '[]'$ Joint pain   '[]'$ Low back pain Hematologic:  '[]'$ Easy bruising  '[]'$ Easy bleeding   '[]'$ Hypercoagulable state   '[]'$ Anemic Gastrointestinal:  '[]'$ Diarrhea   '[]'$ Vomiting  '[]'$ Gastroesophageal reflux/heartburn   '[]'$ Difficulty swallowing. Genitourinary:  '[]'$ Chronic kidney disease   '[]'$ Difficult urination  '[]'$ Frequent urination   '[]'$ Blood in urine Skin:  '[]'$ Rashes   '[]'$ Ulcers  Psychological:  '[]'$ History of anxiety   '[]'$  History of major depression.  Physical Examination  There were no vitals filed for this visit. There is no height or weight on file to calculate BMI. Gen: WD/WN, NAD Head: [place]/AT, No temporalis wasting.  Ear/Nose/Throat: Hearing grossly intact, nares w/o erythema or drainage Eyes: PER, EOMI, sclera nonicteric.  Neck: Supple, no masses.  No bruit or JVD.  Pulmonary:  Good air movement, no audible wheezing, no use of accessory muscles.  Cardiac: RRR, normal S1, S2, no Murmurs. Vascular:  mild trophic changes, no open wounds Vessel Right Left  Radial Palpable Palpable  PT Not Palpable Not Palpable  DP Not Palpable Not Palpable  Gastrointestinal: soft, non-distended. No guarding/no peritoneal signs.  Musculoskeletal: M/S 5/5 throughout.  No visible deformity.  Neurologic: CN 2-12 intact. Pain and light touch intact in extremities.  Symmetrical.  Speech is fluent. Motor exam as listed above. Psychiatric: Judgment intact, Mood & affect appropriate for pt's clinical situation. Dermatologic: No rashes or ulcers noted.  No changes consistent with cellulitis.   CBC Lab Results  Component Value Date   WBC 14.7 (H) 04/16/2022   HGB 12.2 04/16/2022   HCT 39.9 04/16/2022   MCV 85.4 04/16/2022   PLT 152 04/16/2022    BMET    Component Value Date/Time   NA 138 04/16/2022 1152   NA 142 05/03/2014 1036    NA 141 01/17/2014 1551   K 3.4 (L) 04/16/2022 1152   K 3.6 01/17/2014 1551   CL 102 04/16/2022 1152   CL 107 01/17/2014 1551   CO2 26 04/16/2022 1152   CO2 27 01/17/2014 1551   GLUCOSE 121 (H) 04/16/2022 1152   GLUCOSE 86 01/17/2014 1551   BUN 30 (H) 04/16/2022 1152   BUN 6 (L) 05/03/2014 1036   BUN 6 (L) 01/17/2014 1551   CREATININE 1.13 (H) 04/16/2022 1152   CREATININE 0.94 04/03/2014 1336   CALCIUM 8.4 (L) 04/16/2022 1152   CALCIUM 8.5 01/17/2014 1551   GFRNONAA 51 (L) 04/16/2022 1152   GFRNONAA >60 04/03/2014 1336  GFRNONAA >60 01/17/2014 1551   GFRAA >60 02/15/2019 0735   GFRAA >60 04/03/2014 1336   GFRAA >60 01/17/2014 1551   CrCl cannot be calculated (Unknown ideal weight.).  COAG Lab Results  Component Value Date   INR 1.3 (H) 12/04/2021   INR 1.2 12/03/2021   INR 1.0 05/03/2014    Radiology US Venous Img Lower Bilateral  Result Date: 04/16/2022 CLINICAL DATA:  Pain EXAM: BILATERAL LOWER EXTREMITY VENOUS DOPPLER ULTRASOUND TECHNIQUE: Gray-scale sonography with compression, as well as color and duplex ultrasound, were performed to evaluate the deep venous system(s) from the level of the common femoral vein through the popliteal and proximal calf veins. COMPARISON:  None Available. FINDINGS: VENOUS Normal compressibility of the common femoral, superficial femoral, and popliteal veins, as well as the visualized calf veins. Visualized portions of profunda femoral vein and great saphenous vein unremarkable. No filling defects to suggest DVT on grayscale or color Doppler imaging. Doppler waveforms show normal direction of venous flow, normal respiratory plasticity and response to augmentation. OTHER None. Limitations: none IMPRESSION: Negative. Electronically Signed   By: Rolm Baptise M.D.   On: 04/16/2022 20:09   CT Angio Aortobifemoral W and/or Wo Contrast  Result Date: 04/16/2022 CLINICAL DATA:  Left leg pain, claudication/ischemia EXAM: CT ANGIOGRAPHY OF ABDOMINAL AORTA  WITH ILIOFEMORAL RUNOFF TECHNIQUE: Multidetector CT imaging of the abdomen, pelvis and lower extremities was performed using the standard protocol during bolus administration of intravenous contrast. Multiplanar CT image reconstructions and MIPs were obtained to evaluate the vascular anatomy. RADIATION DOSE REDUCTION: This exam was performed according to the departmental dose-optimization program which includes automated exposure control, adjustment of the mA and/or kV according to patient size and/or use of iterative reconstruction technique. CONTRAST:  94m OMNIPAQUE IOHEXOL 350 MG/ML SOLN COMPARISON:  None Available. FINDINGS: VASCULAR Aorta: Mild fusiform dilatation up to 2.5 cm diameter in supra celiac and infrarenal segments. Moderate eccentric nonocclusive thrombus throughout. Celiac: Origin occlusion over length of about proximal 1.5 cm, reconstituted distally by collaterals, with patent trifurcation anatomy. SMA: Eccentric calcified plaque throughout its length resulting in tandem areas of at least mild stenosis. Renals: Single left, calcified ostial plaque resulting in high-grade stenosis over length of at least 1 cm, patent distally. Single right, with the ostial stent which is widely patent, native artery patent distally. IMA: Apparent origin occlusion. RIGHT Lower Extremity Inflow: Common iliac extensive eccentric heavily calcified plaque resulting in tandem areas of mild stenosis Internal iliac heavily calcified atheromatous plaque without aneurysm or occlusion External iliac eccentric heavily calcified plaque resulting in tandem areas of mild stenosis Outflow: Common femoral eccentric calcified plaque without high-grade stenosis Deep femoral branches patent SFA scattered calcified eccentric plaque throughout its length resulting in tandem areas of mild stenosis Popliteal is atheromatous, diminutive but patent through its length Runoff: Anterior tibial is contiguous across the ankle supplying dorsalis  pedis Tibioperoneal trunk is diminutive with high-grade stenosis or short segment occlusion at its bifurcation, with noncontiguous distal runoff. LEFT Lower Extremity Inflow: 3 overlapping stents from the proximal common iliac to the distal external iliac appear patent. The internal iliac is atheromatous, diminutive. Outflow: Common femoral eccentric calcified plaque resulting in tandem areas of at least mild stenosis. Deep femoral branches patent SFA is diminutive proximally, long segment occlusion involving its mid and distal segments a extending through a thrombosed proximal popliteal arterial stent. There is collateral reconstitution of the native popliteal artery above the knee which is diminutive but patent across the knee distally. Runoff: Contiguous anterior tibial  and peroneal runoff. Posterior tibial artery  occluded throughout its length. Veins: No obvious venous abnormality within the limitations of this arterial phase study. Review of the MIP images confirms the above findings. NON-VASCULAR Lower chest: Left pleural effusion partially visualized. Hepatobiliary: Visualized portions of liver unremarkable, the cephalad aspect excluded. Calcified gallstones up to 1.3 cm diameter in the nondistended gallbladder. Pancreas: No focal liver abnormality is seen. No gallstones, gallbladder wall thickening, or biliary dilatation. Spleen: Normal in size without focal abnormality, cephalad extent not visualized. Adrenals/Urinary Tract: No adrenal mass. Diffuse left renal parenchymal atrophy. Compensatory hypertrophy of the right kidney. No hydronephrosis. Urinary bladder incompletely distended. Stomach/Bowel: Stomach is decompressed, visualized segments unremarkable. Small bowel is nondistended. By by appendix not discretely identified. The colon is incompletely distended with a few descending and sigmoid diverticula; no adjacent inflammatory change. Lymphatic: No abdominal or pelvic adenopathy. Reproductive:  Calcified uterine  fibroid.  No adnexal mass. Other: No ascites.  No free air. Musculoskeletal: Bilateral L5 pars defects with grade 1 anterolisthesis and degenerative disc disease L5-S1. No acute findings. IMPRESSION: 1. 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 2. High-grade left renal artery stenosis with diffuse left renal parenchymal atrophy. 3. Patent LEFT iliac arterial stents. 4. Long segment LEFT SFA and popliteal artery occlusion, with collateral reconstitution of the native popliteal artery above the knee. 5. Two-vessel LEFT anterior tibial and peroneal runoff. 6. Patent RIGHT SFA and popliteal arteries with anterior tibial runoff. 7. Cholelithiasis. 8. Descending and sigmoid diverticulosis. 9. Left pleural effusion. 10. Bilateral L5 pars defects with grade 1 anterolisthesis L5-S1. Electronically Signed   By: Lucrezia Europe M.D.   On: 04/16/2022 16:07   VAS [place] ABI WITH/WO TBI  Result Date: 04/14/2022  LOWER EXTREMITY DOPPLER STUDY Patient Name:  Christine [name]  Date of Exam:   04/10/2022 Medical Rec #: [id number]      Accession #:    [id number] Date of Birth: 05/23/1946     Patient Gender: F Patient Age:   72 years Exam Location:  [facility] Procedure:      VAS [place] ABI WITH/WO TBI Referring Phys: [facility] --------------------------------------------------------------------------------  Indications: Rest pain.  Vascular Interventions: 11/2021 bilat CIA/EIA stents. Performing Technologist: Concha Norway RVT  Examination Guidelines: A complete evaluation includes at minimum, Doppler waveform signals and systolic blood pressure reading at the level of bilateral brachial, anterior tibial, and posterior tibial arteries, when vessel segments are accessible. Bilateral testing is considered an integral part of a complete examination. Photoelectric Plethysmograph (PPG) waveforms and toe systolic pressure readings are included as required and additional duplex  testing as needed. Limited examinations for reoccurring indications may be performed as noted.  ABI Findings: +---------+------------------+-----+----------+--------+ Right    Rt Pressure (mmHg)IndexWaveform  Comment  +---------+------------------+-----+----------+--------+ Brachial 158                                       +---------+------------------+-----+----------+--------+ ATA      167               1.04 biphasic           +---------+------------------+-----+----------+--------+ PTA      168               1.05 monophasic         +---------+------------------+-----+----------+--------+ Aleene Davidson  0.94 Abnormal           +---------+------------------+-----+----------+--------+ +---------+------------------+-----+----------+-------+ Left     Lt Pressure (mmHg)IndexWaveform  Comment +---------+------------------+-----+----------+-------+ Brachial 160                                      +---------+------------------+-----+----------+-------+ ATA      89                0.56 monophasic        +---------+------------------+-----+----------+-------+ PTA      64                0.40 monophasic        +---------+------------------+-----+----------+-------+ Great Toe35                0.22 Abnormal          +---------+------------------+-----+----------+-------+ +-------+-----------+-----------+------------+------------+ ABI/TBIToday's ABIToday's TBIPrevious ABIPrevious TBI +-------+-----------+-----------+------------+------------+ Right  1.05       .94        .95         .56          +-------+-----------+-----------+------------+------------+ Left   .56        .22        .49         .35          +-------+-----------+-----------+------------+------------+ Right ABIs appear essentially unchanged compared to prior study on 2020. Left ABIs and TBIs appear essentially unchanged compared to prior study on 2020.  Summary: Right:  Resting right ankle-brachial index is within normal range. The right toe-brachial index is normal. Added duplex views show CFA, SFA. and poplteal arteries are patent with biphasic flow. Distal PTA occluded, ATA is patent with biphasic flow. Left: Resting left ankle-brachial index indicates moderate left lower extremity arterial disease. The left toe-brachial index is abnormal. Added duplex views show Left CFA and proximal SFA are patent with monophasic flow. Popliteal artery shows dampened monophasic flow. Distal PTA is occluded. Distal ATA is dampened monophasic flow. *See table(s) above for measurements and observations.  Electronically signed by Hortencia Pilar MD on 04/14/2022 at 5:38:48 PM.    Final      Assessment/Plan There are no diagnoses linked to this encounter.   Hortencia Pilar, MD  05/04/2022 4:00 PM

## 2022-05-16 DEATH — deceased

## 2023-05-28 IMAGING — DX DG CHEST 1V PORT
1 series · 1 of 1 positions shown · non-contrast
Comparison: Chest x-ray 12/20/2014.

CLINICAL DATA: 75-year-old female with history of chest pain.

EXAM:
PORTABLE CHEST 1 VIEW

[chest ap]
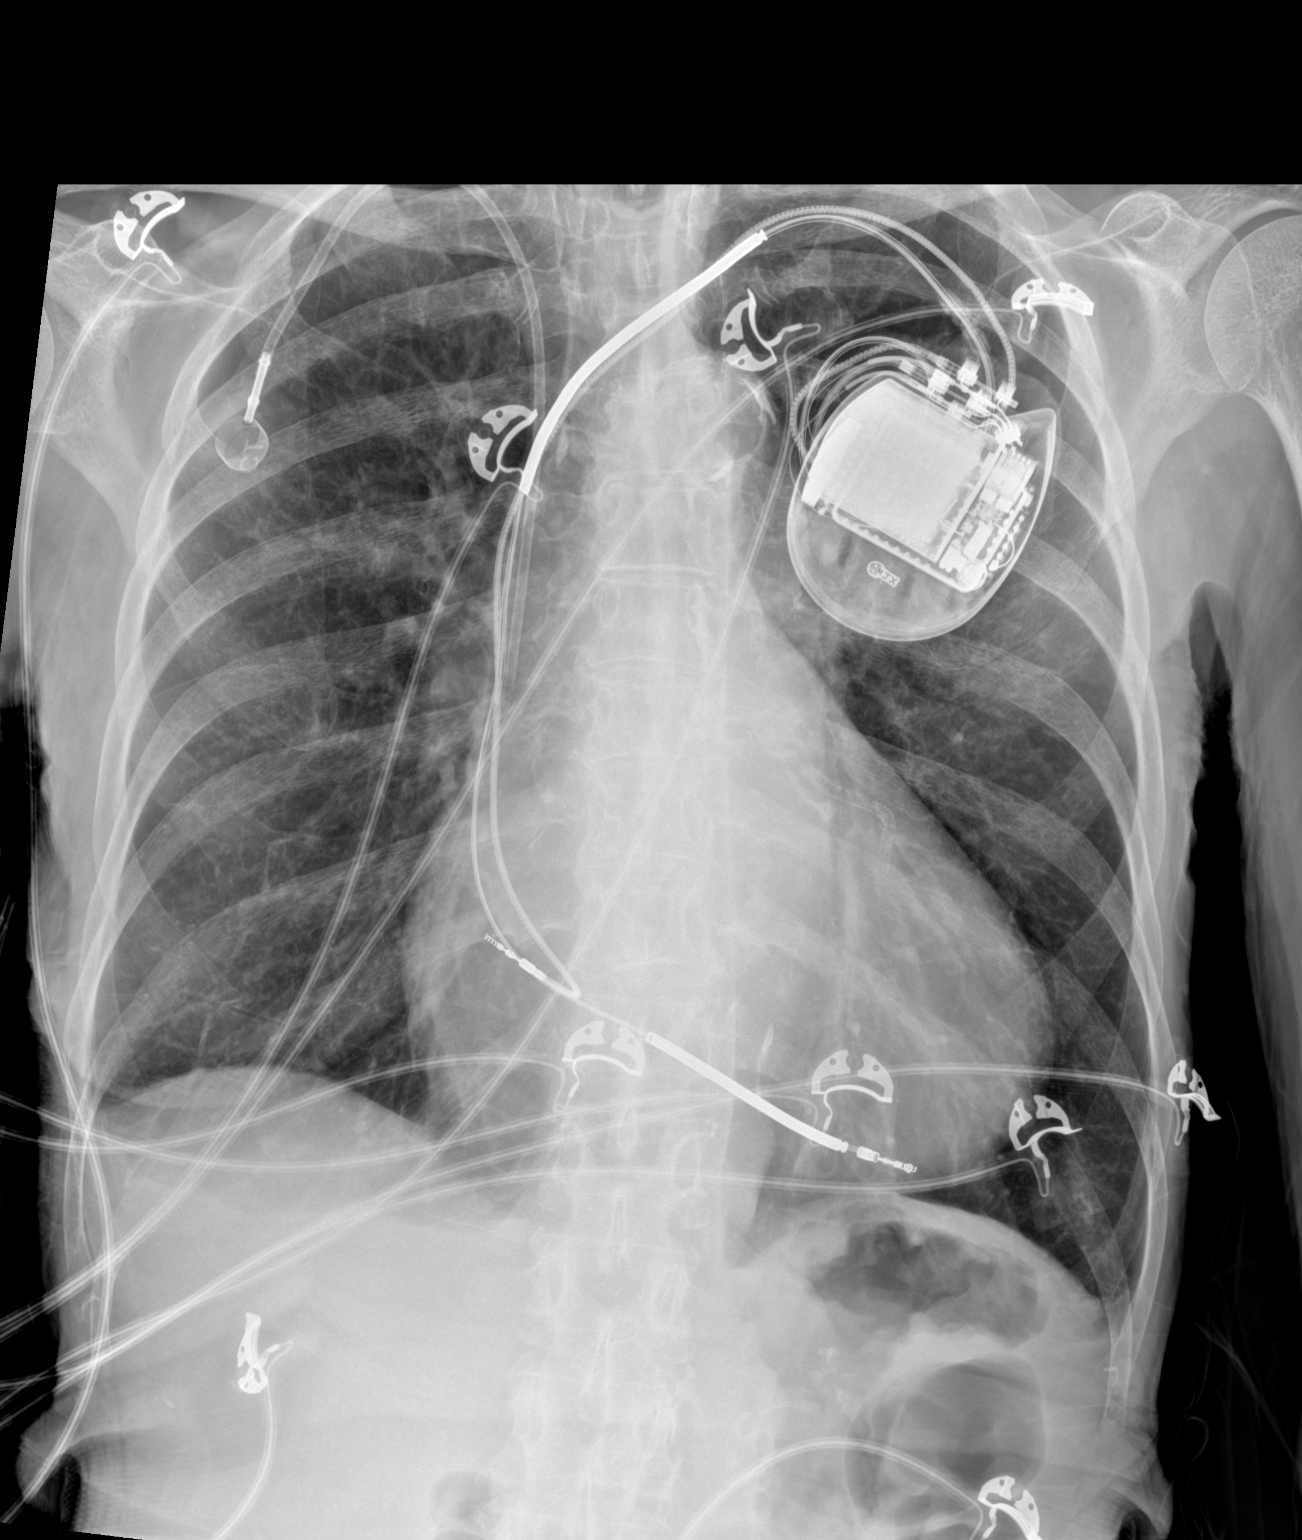

[1 of 1 positions shown; findings below may reference images not displayed]

FINDINGS: Right internal jugular single-lumen porta cath with tip terminating
at the superior cavoatrial junction. Left-sided pacemaker/AICD with
lead tips projecting over the expected location of the right atrium
and right ventricle. Lung volumes are normal. No consolidative
airspace disease. No pleural effusions. No pneumothorax. No evidence
of pulmonary edema. Heart size is mildly enlarged. Upper mediastinal
contours are within normal limits. Atherosclerotic calcifications in
the thoracic aorta.
IMPRESSION: 1. No radiographic evidence of acute cardiopulmonary disease.
2. Cardiomegaly.
3. Aortic atherosclerosis.
4. Support apparatus, as above.

## 2023-05-28 IMAGING — CT CT HEAD W/O CM
4 series · 17 of 47 positions shown, 19 images · non-contrast
Comparison: CT head 02/14/2005

CLINICAL DATA: Altered mental status



[Series 2: head wo · axial · 0.41mm/px · z∈[-129,-14]mm · 7 of 31 slices shown, 9 images]
[im 4/31  brain]
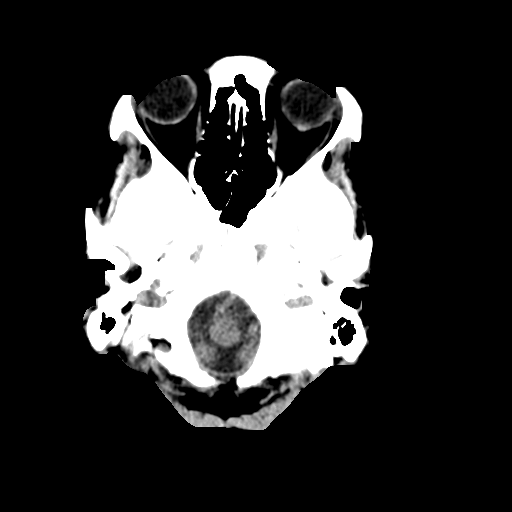
[im 4/31  bone]
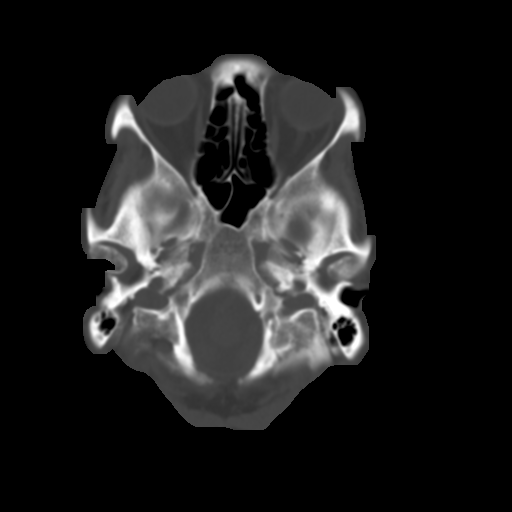
[im 8/31  brain]
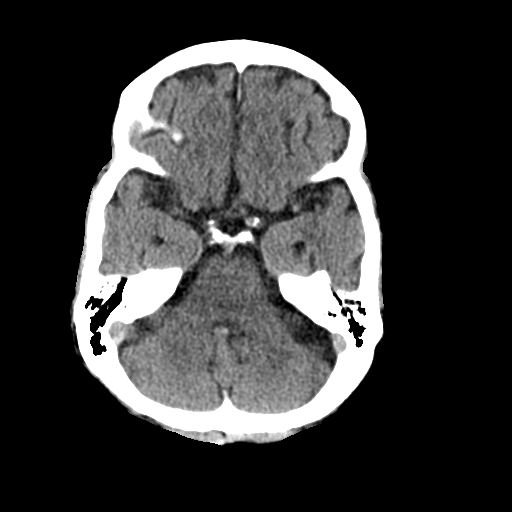
[im 12/31  brain]
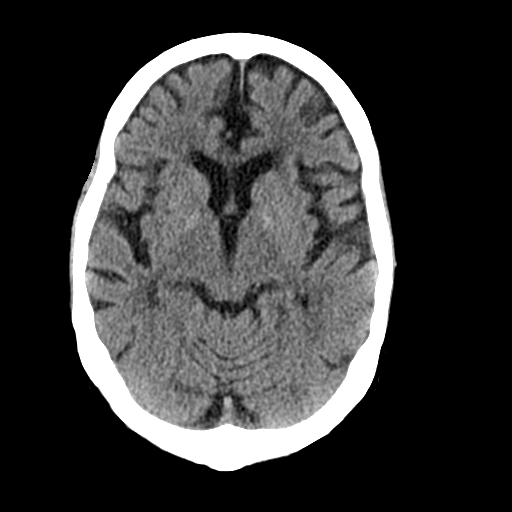
[im 16/31  brain]
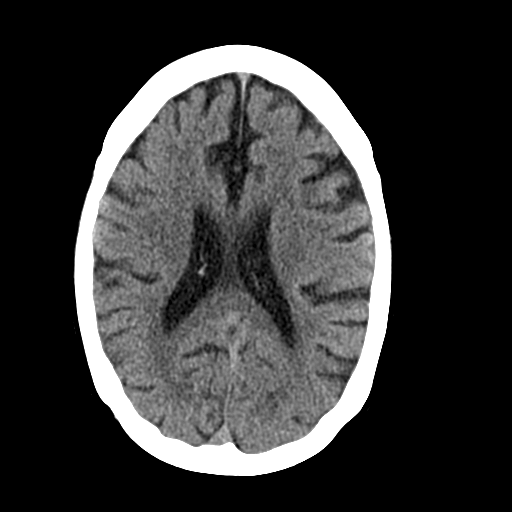
[im 19/31  brain]
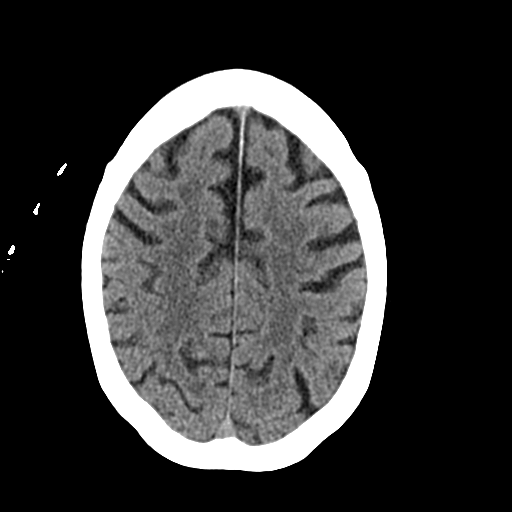
[im 19/31  bone]
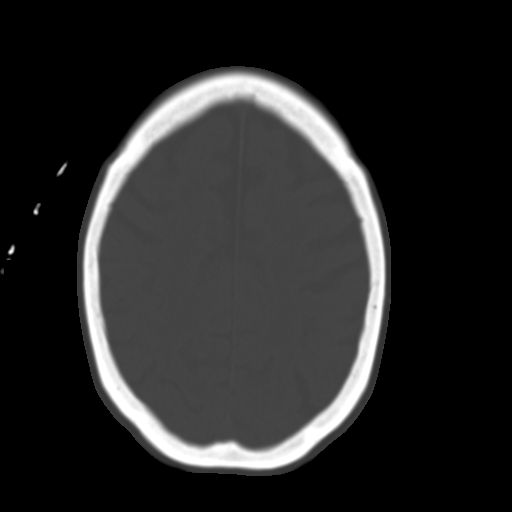
[im 23/31  brain]
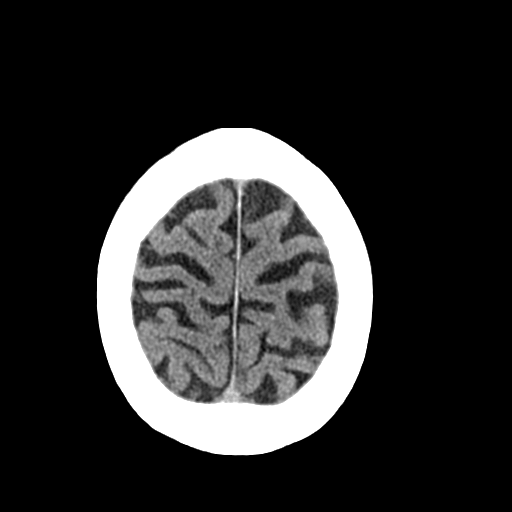
[im 27/31  brain]
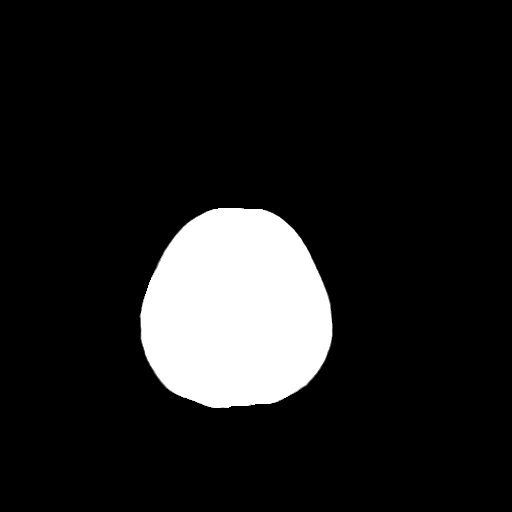

[Series 3: head bone · axial · 0.41mm/px · z∈[-130,-78]mm · 4 of 76 slices shown]
[im 8/76  bone]
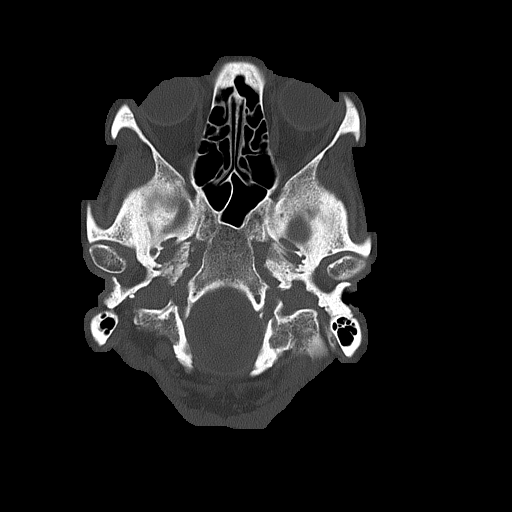
[im 16/76  bone]
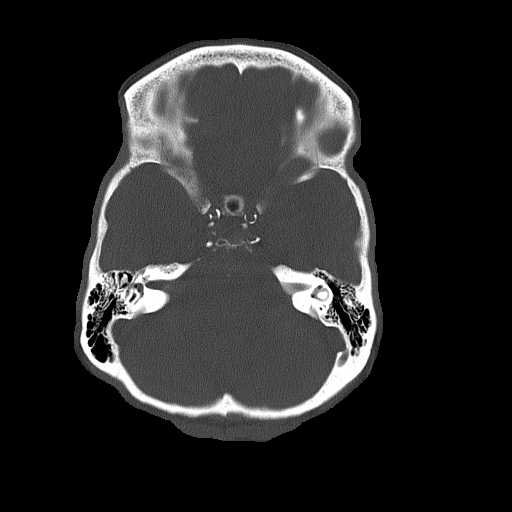
[im 23/76  bone]
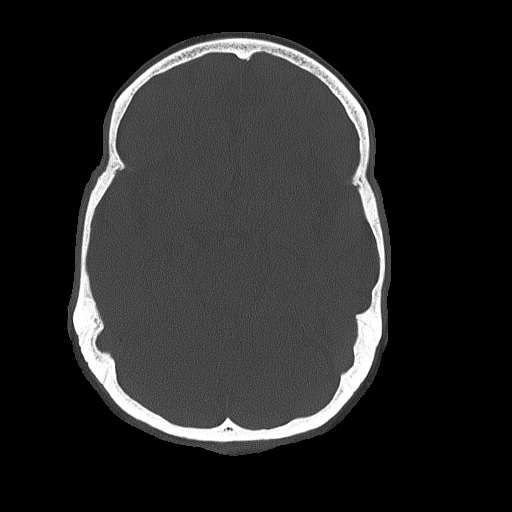
[im 34/76  bone]
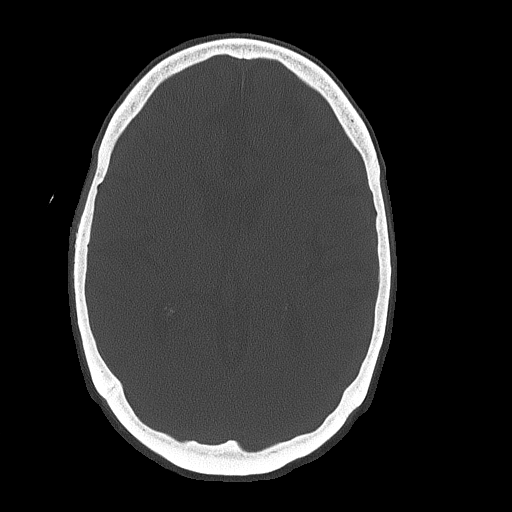

[Series 4: coronal soft tissue · coronal · 0.29mm/px · 3 of 62 slices shown]
[im 21/62  brain]
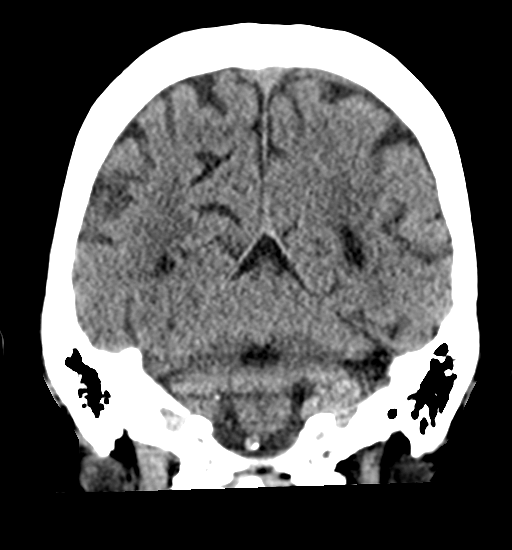
[im 28/62  brain]
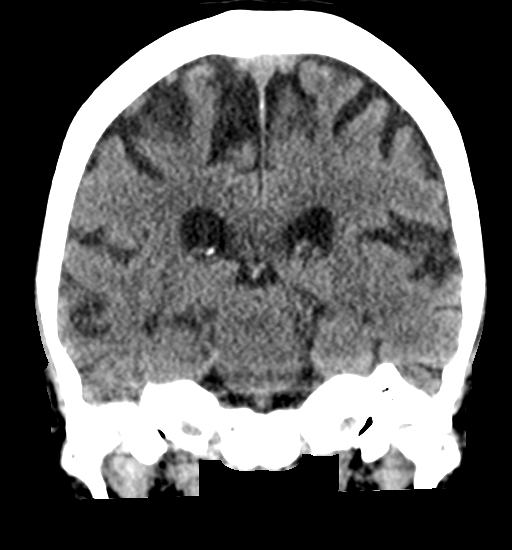
[im 34/62  brain]
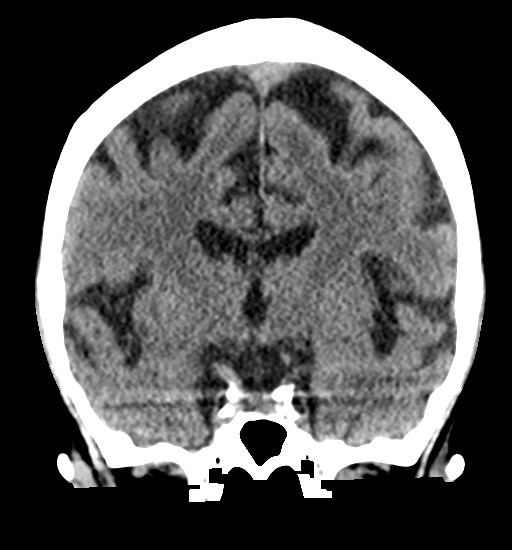

[Series 5: sagittal soft tissue · sagittal · 0.31mm/px · 3 of 54 slices shown]
[im 18/54  brain]
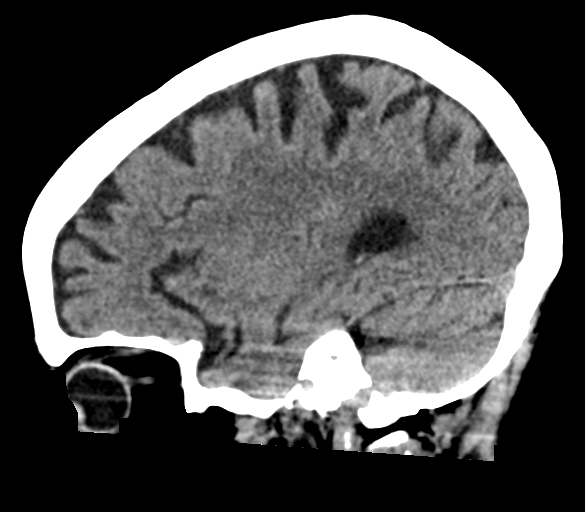
[im 27/54  brain]
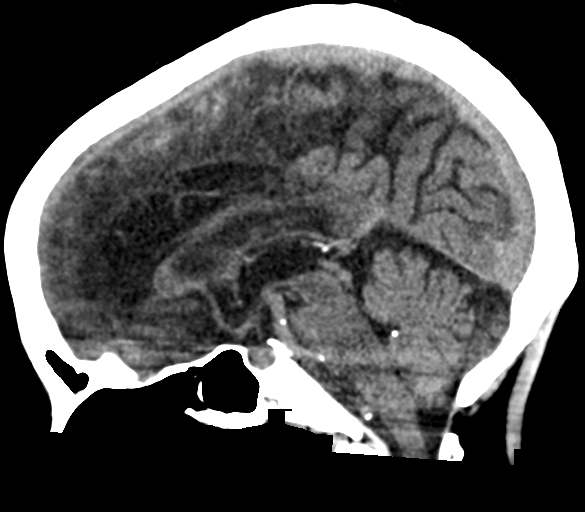
[im 36/54  brain]
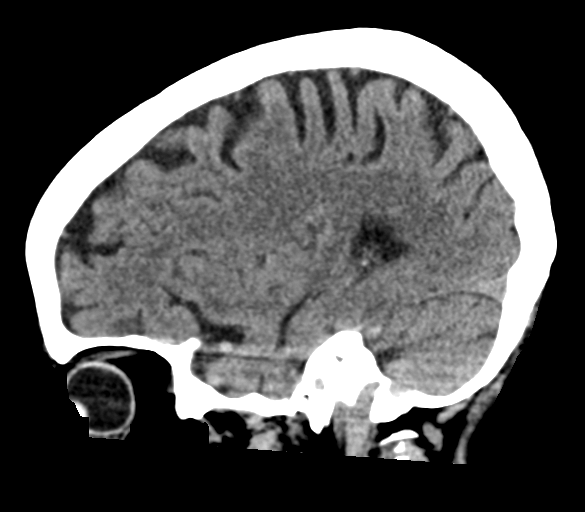

[17 of 47 positions shown; findings below may reference images not displayed]

FINDINGS: Brain: No acute intracranial hemorrhage, mass effect, or herniation.
No extra-axial fluid collections. No evidence of acute territorial
infarct. No hydrocephalus. Moderate cortical volume loss. Patchy
hypodensities in the periventricular and subcortical white matter,
likely secondary to chronic microvascular ischemic changes.

Vascular: Calcified plaques in the carotid siphons.

Skull: Normal. Negative for fracture or focal lesion.

Sinuses/Orbits: No acute finding.

Other: None.
IMPRESSION: Chronic changes with no acute intracranial process identified.

## 2024-05-16 DEATH — deceased
